# Patient Record
Sex: Female | Born: 1960
Health system: Southern US, Community
[De-identification: ages and names within clinical notes are randomized; demographics above are authoritative.]

## PROBLEM LIST (undated history)

## (undated) DIAGNOSIS — Z86010 Personal history of colonic polyps: Secondary | ICD-10-CM

## (undated) DIAGNOSIS — J4520 Mild intermittent asthma, uncomplicated: Secondary | ICD-10-CM

## (undated) DIAGNOSIS — Z9109 Other allergy status, other than to drugs and biological substances: Secondary | ICD-10-CM

## (undated) DIAGNOSIS — E785 Hyperlipidemia, unspecified: Secondary | ICD-10-CM

## (undated) DIAGNOSIS — R51 Headache: Secondary | ICD-10-CM

## (undated) DIAGNOSIS — IMO0001 Reserved for inherently not codable concepts without codable children: Secondary | ICD-10-CM

## (undated) DIAGNOSIS — R7303 Prediabetes: Secondary | ICD-10-CM

## (undated) DIAGNOSIS — D649 Anemia, unspecified: Secondary | ICD-10-CM

## (undated) DIAGNOSIS — L309 Dermatitis, unspecified: Secondary | ICD-10-CM

## (undated) DIAGNOSIS — R0602 Shortness of breath: Secondary | ICD-10-CM

## (undated) DIAGNOSIS — K219 Gastro-esophageal reflux disease without esophagitis: Secondary | ICD-10-CM

## (undated) DIAGNOSIS — Z860101 Personal history of adenomatous and serrated colon polyps: Secondary | ICD-10-CM

## (undated) DIAGNOSIS — J45909 Unspecified asthma, uncomplicated: Secondary | ICD-10-CM

## (undated) DIAGNOSIS — T7840XA Allergy, unspecified, initial encounter: Secondary | ICD-10-CM

## (undated) DIAGNOSIS — I10 Essential (primary) hypertension: Secondary | ICD-10-CM

## (undated) DIAGNOSIS — J9600 Acute respiratory failure, unspecified whether with hypoxia or hypercapnia: Secondary | ICD-10-CM

## (undated) DIAGNOSIS — F419 Anxiety disorder, unspecified: Secondary | ICD-10-CM

## (undated) HISTORY — DX: Prediabetes: R73.03

## (undated) HISTORY — PX: COLONOSCOPY: SHX174

## (undated) HISTORY — DX: Hyperlipidemia, unspecified: E78.5

## (undated) HISTORY — DX: Personal history of adenomatous and serrated colon polyps: Z86.0101

## (undated) HISTORY — DX: Reserved for inherently not codable concepts without codable children: IMO0001

## (undated) HISTORY — DX: Dermatitis, unspecified: L30.9

## (undated) HISTORY — DX: Personal history of colonic polyps: Z86.010

## (undated) HISTORY — DX: Anemia, unspecified: D64.9

## (undated) HISTORY — DX: Allergy, unspecified, initial encounter: T78.40XA

## (undated) HISTORY — PX: NASAL SINUS SURGERY: SHX719

## (undated) HISTORY — PX: POLYPECTOMY: SHX149

---
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
## 1991-08-06 HISTORY — PX: ABDOMINAL HYSTERECTOMY: SHX81

## 1998-01-01 ENCOUNTER — Emergency Department (HOSPITAL_COMMUNITY): Admission: EM | Admit: 1998-01-01 | Discharge: 1998-01-01 | Payer: Self-pay | Admitting: Emergency Medicine

## 1999-02-16 ENCOUNTER — Encounter: Admission: RE | Admit: 1999-02-16 | Discharge: 1999-03-15 | Payer: Self-pay | Admitting: *Deleted

## 2000-10-06 ENCOUNTER — Encounter: Payer: Self-pay | Admitting: Emergency Medicine

## 2000-10-06 ENCOUNTER — Emergency Department (HOSPITAL_COMMUNITY): Admission: EM | Admit: 2000-10-06 | Discharge: 2000-10-06 | Payer: Self-pay | Admitting: Emergency Medicine

## 2000-12-04 ENCOUNTER — Emergency Department (HOSPITAL_COMMUNITY): Admission: EM | Admit: 2000-12-04 | Discharge: 2000-12-04 | Payer: Self-pay | Admitting: Emergency Medicine

## 2003-07-09 ENCOUNTER — Emergency Department (HOSPITAL_COMMUNITY): Admission: EM | Admit: 2003-07-09 | Discharge: 2003-07-09 | Payer: Self-pay | Admitting: Emergency Medicine

## 2003-11-18 ENCOUNTER — Encounter: Admission: RE | Admit: 2003-11-18 | Discharge: 2003-11-18 | Payer: Self-pay | Admitting: Internal Medicine

## 2004-01-18 ENCOUNTER — Encounter: Admission: RE | Admit: 2004-01-18 | Discharge: 2004-01-18 | Payer: Self-pay | Admitting: Internal Medicine

## 2006-09-03 ENCOUNTER — Emergency Department (HOSPITAL_COMMUNITY): Admission: EM | Admit: 2006-09-03 | Discharge: 2006-09-03 | Payer: Self-pay | Admitting: Emergency Medicine

## 2008-11-02 ENCOUNTER — Emergency Department (HOSPITAL_COMMUNITY): Admission: EM | Admit: 2008-11-02 | Discharge: 2008-11-02 | Payer: Self-pay | Admitting: Emergency Medicine

## 2010-09-17 ENCOUNTER — Inpatient Hospital Stay (INDEPENDENT_AMBULATORY_CARE_PROVIDER_SITE_OTHER)
Admission: RE | Admit: 2010-09-17 | Discharge: 2010-09-17 | Disposition: A | Discharge status: Home / Self Care | Payer: 59 | Source: Ambulatory Visit | Attending: Family Medicine | Admitting: Family Medicine

## 2010-09-17 DIAGNOSIS — J45909 Unspecified asthma, uncomplicated: Secondary | ICD-10-CM

## 2010-09-17 DIAGNOSIS — J309 Allergic rhinitis, unspecified: Secondary | ICD-10-CM

## 2010-12-21 ENCOUNTER — Emergency Department (HOSPITAL_COMMUNITY): Payer: 59

## 2010-12-21 ENCOUNTER — Emergency Department (HOSPITAL_COMMUNITY)
Admission: EM | Admit: 2010-12-21 | Discharge: 2010-12-21 | Disposition: A | Discharge status: Home / Self Care | Payer: 59 | Attending: Emergency Medicine | Admitting: Emergency Medicine

## 2010-12-21 DIAGNOSIS — J45909 Unspecified asthma, uncomplicated: Secondary | ICD-10-CM | POA: Insufficient documentation

## 2010-12-21 DIAGNOSIS — R0609 Other forms of dyspnea: Secondary | ICD-10-CM | POA: Insufficient documentation

## 2010-12-21 DIAGNOSIS — R05 Cough: Secondary | ICD-10-CM | POA: Insufficient documentation

## 2010-12-21 DIAGNOSIS — R0989 Other specified symptoms and signs involving the circulatory and respiratory systems: Secondary | ICD-10-CM | POA: Insufficient documentation

## 2010-12-21 DIAGNOSIS — J40 Bronchitis, not specified as acute or chronic: Secondary | ICD-10-CM | POA: Insufficient documentation

## 2010-12-21 DIAGNOSIS — J3489 Other specified disorders of nose and nasal sinuses: Secondary | ICD-10-CM | POA: Insufficient documentation

## 2010-12-21 DIAGNOSIS — R059 Cough, unspecified: Secondary | ICD-10-CM | POA: Insufficient documentation

## 2012-03-01 ENCOUNTER — Emergency Department (HOSPITAL_COMMUNITY)
Admission: EM | Admit: 2012-03-01 | Discharge: 2012-03-01 | Disposition: A | Discharge status: Home / Self Care | Payer: 59 | Attending: Emergency Medicine | Admitting: Emergency Medicine

## 2012-03-01 ENCOUNTER — Encounter (HOSPITAL_COMMUNITY): Payer: Self-pay | Admitting: Emergency Medicine

## 2012-03-01 DIAGNOSIS — J45909 Unspecified asthma, uncomplicated: Secondary | ICD-10-CM | POA: Insufficient documentation

## 2012-03-01 DIAGNOSIS — I10 Essential (primary) hypertension: Secondary | ICD-10-CM | POA: Insufficient documentation

## 2012-03-01 DIAGNOSIS — T783XXA Angioneurotic edema, initial encounter: Secondary | ICD-10-CM | POA: Insufficient documentation

## 2012-03-01 DIAGNOSIS — T464X5A Adverse effect of angiotensin-converting-enzyme inhibitors, initial encounter: Secondary | ICD-10-CM

## 2012-03-01 DIAGNOSIS — T46905A Adverse effect of unspecified agents primarily affecting the cardiovascular system, initial encounter: Secondary | ICD-10-CM | POA: Insufficient documentation

## 2012-03-01 HISTORY — DX: Unspecified asthma, uncomplicated: J45.909

## 2012-03-01 HISTORY — DX: Essential (primary) hypertension: I10

## 2012-03-01 HISTORY — DX: Other allergy status, other than to drugs and biological substances: Z91.09

## 2012-03-01 MED ORDER — DIPHENHYDRAMINE HCL 50 MG/ML IJ SOLN
25.0000 mg | Freq: Once | INTRAMUSCULAR | Status: AC
  Administered 2012-03-01: 25 mg via INTRAVENOUS
  Filled 2012-03-01: qty 1

## 2012-03-01 MED ORDER — FAMOTIDINE 20 MG PO TABS: 20.0000 mg | ORAL_TABLET | Freq: Two times a day (BID) | ORAL | Status: DC

## 2012-03-01 MED ORDER — DIPHENHYDRAMINE HCL 25 MG PO TABS: 25.0000 mg | ORAL_TABLET | Freq: Four times a day (QID) | ORAL | Status: DC

## 2012-03-01 MED ORDER — METHYLPREDNISOLONE SODIUM SUCC 125 MG IJ SOLR
125.0000 mg | Freq: Once | INTRAMUSCULAR | Status: AC
  Administered 2012-03-01: 125 mg via INTRAVENOUS
  Filled 2012-03-01: qty 2

## 2012-03-01 MED ORDER — FAMOTIDINE IN NACL 20-0.9 MG/50ML-% IV SOLN
20.0000 mg | Freq: Once | INTRAVENOUS | Status: AC
  Administered 2012-03-01: 20 mg via INTRAVENOUS
  Filled 2012-03-01: qty 50

## 2012-03-01 MED ORDER — PREDNISONE 20 MG PO TABS: 60.0000 mg | ORAL_TABLET | Freq: Every day | ORAL | Status: AC

## 2012-03-01 NOTE — ED Notes (Signed)
Patient with angioedema to lips.  Patient started Lisinopril yesterday am.  No shortness of breath.

## 2012-03-01 NOTE — ED Provider Notes (Signed)
History     CSN: [id number]  Arrival date & time 03/01/12  0109   First MD Initiated Contact with Patient 03/01/12 0133      Chief Complaint  Patient presents with  . Medication Reaction    (Consider location/radiation/quality/duration/timing/severity/associated sxs/prior treatment) HPI 51 yo female presents to the ER with complaint of lip swelling.  Pt was started yesterday on lisinopril, reports taking first pill around 5 pm and noticiing swelling to the left side of her mouth around 9 pm.  Pt woke to find worsening swelling.  She denies any swelling of tongue, diffculties in swallowing, breathing.  No prior h/o angioedema.   Past Medical History  Diagnosis Date  . Hypertension   . Environmental allergies   . Asthma     History reviewed. No pertinent past surgical history.  History reviewed. No pertinent family history.  History  Substance Use Topics  . Smoking status: Never Smoker   . Smokeless tobacco: Not on file  . Alcohol Use: No    OB History    Grav Para Term Preterm Abortions TAB SAB Ect Mult Living                  Review of Systems  All other systems reviewed and are negative.    Allergies  Ace inhibitors and Ibuprofen  Home Medications   Current Outpatient Rx  Name Route Sig Dispense Refill  . ALBUTEROL SULFATE HFA 108 (90 BASE) MCG/ACT IN AERS Inhalation Inhale 2 puffs into the lungs every 6 (six) hours as needed. For shortness of breath    . BUDESONIDE-FORMOTEROL FUMARATE 160-4.5 MCG/ACT IN AERO Inhalation Inhale 2 puffs into the lungs 2 (two) times daily.    Marland Kitchen METOPROLOL TARTRATE 50 MG PO TABS Oral Take 50 mg by mouth 2 (two) times daily.    . MOMETASONE FUROATE 50 MCG/ACT NA SUSP Nasal Place 2 sprays into the nose daily.    Marland Kitchen DIPHENHYDRAMINE HCL 25 MG PO TABS Oral Take 1 tablet (25 mg total) by mouth every 6 (six) hours. 20 tablet 0  . FAMOTIDINE 20 MG PO TABS Oral Take 1 tablet (20 mg total) by mouth 2 (two) times daily. 30 tablet 0  .  PREDNISONE 20 MG PO TABS Oral Take 3 tablets (60 mg total) by mouth daily. 15 tablet 0    BP 119/68  Pulse 75  Temp 98.5 F (36.9 C) (Oral)  Resp 20  SpO2 97%  Physical Exam  Nursing note and vitals reviewed. Constitutional: She is oriented to person, place, and time. She appears well-developed and well-nourished. No distress.  HENT:  Head: Normocephalic and atraumatic.  Nose: Nose normal.  Mouth/Throat: Oropharynx is clear and moist.       Angioedema of lips.  No swelling of tongue, posterior pharynx or uvula.  No stridor or dysphagia, dysphonia  Eyes: Conjunctivae and EOM are normal. Pupils are equal, round, and reactive to light.  Neck: Normal range of motion. Neck supple. No JVD present. No tracheal deviation present. No thyromegaly present.  Cardiovascular: Normal rate, regular rhythm, normal heart sounds and intact distal pulses.  Exam reveals no gallop and no friction rub.   No murmur heard. Pulmonary/Chest: Effort normal and breath sounds normal. No stridor. No respiratory distress. She has no wheezes. She has no rales. She exhibits no tenderness.  Abdominal: Soft. Bowel sounds are normal. She exhibits no distension and no mass. There is no tenderness. There is no rebound and no guarding.  Musculoskeletal: Normal range  of motion. She exhibits no edema and no tenderness.  Lymphadenopathy:    She has no cervical adenopathy.  Neurological: She is alert and oriented to person, place, and time. She has normal reflexes. She exhibits normal muscle tone. Coordination normal.  Skin: Skin is dry. No rash noted. She is not diaphoretic. No erythema. No pallor.  Psychiatric: She has a normal mood and affect. Her behavior is normal. Judgment and thought content normal.    ED Course  Procedures (including critical care time)  Labs Reviewed - No data to display No results found.   1. ACE inhibitor-aggravated angioedema       MDM  Angioedema most likely due to recent ACE inhibitor  prescription.  No progression of symptoms, and lips are less swollen then on arrival.  To f/u with pcm for alternative bp medications        Olivia Mackie, MD 03/01/12 1723

## 2012-03-01 NOTE — ED Notes (Signed)
Pt to RN first with swelling to lips; pt denied SOB or difficulty breathing; while checking in, continued to speak with pt re: what she had reaction to, pt's friend reports she was just started on a new medicine--pt showed medicine bottle for lisinopril--notified triage RN that pt was checking in with angioedema- walked pt to triage room 3

## 2012-06-05 ENCOUNTER — Encounter (HOSPITAL_COMMUNITY): Payer: Self-pay | Admitting: Emergency Medicine

## 2012-06-05 ENCOUNTER — Emergency Department (INDEPENDENT_AMBULATORY_CARE_PROVIDER_SITE_OTHER)
Admission: EM | Admit: 2012-06-05 | Discharge: 2012-06-05 | Disposition: A | Discharge status: Home / Self Care | Payer: Self-pay | Source: Home / Self Care | Attending: Family Medicine | Admitting: Family Medicine

## 2012-06-05 DIAGNOSIS — I10 Essential (primary) hypertension: Secondary | ICD-10-CM

## 2012-06-05 DIAGNOSIS — J45901 Unspecified asthma with (acute) exacerbation: Secondary | ICD-10-CM

## 2012-06-05 DIAGNOSIS — J329 Chronic sinusitis, unspecified: Secondary | ICD-10-CM

## 2012-06-05 DIAGNOSIS — E876 Hypokalemia: Secondary | ICD-10-CM

## 2012-06-05 LAB — POCT I-STAT, CHEM 8
Calcium, Ion: 1.25 mmol/L — ABNORMAL HIGH (ref 1.12–1.23)
Glucose, Bld: 85 mg/dL (ref 70–99)
HCT: 41 % (ref 36.0–46.0)
Hemoglobin: 13.9 g/dL (ref 12.0–15.0)
TCO2: 30 mmol/L (ref 0–100)

## 2012-06-05 MED ORDER — TRIAMTERENE-HCTZ 37.5-25 MG PO TABS: 1.0000 | ORAL_TABLET | Freq: Every day | ORAL | Status: DC

## 2012-06-05 MED ORDER — CLONIDINE HCL 0.1 MG PO TABS
ORAL_TABLET | ORAL | Status: AC
  Filled 2012-06-05: qty 1

## 2012-06-05 MED ORDER — BUDESONIDE-FORMOTEROL FUMARATE 160-4.5 MCG/ACT IN AERO: 2.0000 | INHALATION_SPRAY | Freq: Two times a day (BID) | RESPIRATORY_TRACT | Status: DC

## 2012-06-05 MED ORDER — MOMETASONE FUROATE 50 MCG/ACT NA SUSP: 2.0000 | Freq: Every day | NASAL | Status: DC

## 2012-06-05 MED ORDER — METHYLPREDNISOLONE SODIUM SUCC 125 MG IJ SOLR
125.0000 mg | Freq: Once | INTRAMUSCULAR | Status: AC
  Administered 2012-06-05: 125 mg via INTRAMUSCULAR

## 2012-06-05 MED ORDER — METHYLPREDNISOLONE SODIUM SUCC 125 MG IJ SOLR: 125.0000 mg | Freq: Once | INTRAMUSCULAR | Status: DC

## 2012-06-05 MED ORDER — PREDNISONE 20 MG PO TABS: ORAL_TABLET | ORAL | Status: DC

## 2012-06-05 MED ORDER — ALBUTEROL SULFATE HFA 108 (90 BASE) MCG/ACT IN AERS: 2.0000 | INHALATION_SPRAY | Freq: Four times a day (QID) | RESPIRATORY_TRACT | Status: DC | PRN

## 2012-06-05 MED ORDER — ALBUTEROL SULFATE (5 MG/ML) 0.5% IN NEBU
INHALATION_SOLUTION | RESPIRATORY_TRACT | Status: AC
  Filled 2012-06-05: qty 0.5

## 2012-06-05 MED ORDER — ALBUTEROL SULFATE (5 MG/ML) 0.5% IN NEBU
2.5000 mg | INHALATION_SOLUTION | Freq: Once | RESPIRATORY_TRACT | Status: AC
  Administered 2012-06-05: 2.5 mg via RESPIRATORY_TRACT

## 2012-06-05 MED ORDER — CLONIDINE HCL ER 0.1 MG PO TB12
0.1000 mg | ORAL_TABLET | Freq: Once | ORAL | Status: AC
  Administered 2012-06-05: 0.1 mg via ORAL

## 2012-06-05 MED ORDER — METHYLPREDNISOLONE SODIUM SUCC 125 MG IJ SOLR
INTRAMUSCULAR | Status: AC
  Filled 2012-06-05: qty 2

## 2012-06-05 MED ORDER — AMOXICILLIN 500 MG PO CAPS: 500.0000 mg | ORAL_CAPSULE | Freq: Three times a day (TID) | ORAL | Status: DC

## 2012-06-05 MED ORDER — POTASSIUM CHLORIDE ER 10 MEQ PO TBCR: 10.0000 meq | EXTENDED_RELEASE_TABLET | Freq: Two times a day (BID) | ORAL | Status: DC

## 2012-06-05 MED ORDER — IPRATROPIUM BROMIDE 0.02 % IN SOLN
0.5000 mg | Freq: Once | RESPIRATORY_TRACT | Status: AC
  Administered 2012-06-05: 0.5 mg via RESPIRATORY_TRACT

## 2012-06-05 NOTE — ED Notes (Signed)
Pt c/o cold sx x2 weeks... Sx include: coughing w/yellow sputum, headaches, diarrhea, nauseas, SOB, wheezing, nasal congestion... Denies: fevers, vomiting.... Pt is alert w/no signs of distress.

## 2012-06-05 NOTE — ED Provider Notes (Signed)
History     CSN: [id number]  Arrival date & time 06/05/12  1205   First MD Initiated Contact with Patient 06/05/12 1306      Chief Complaint  Patient presents with  . URI    (Consider location/radiation/quality/duration/timing/severity/associated sxs/prior treatment) HPI Comments: 51 year old female with history of chronic rhinosinusitis, asthma and hypertension. Here complaining of cold like symptoms for 2 weeks. Patient reports nasal congestion to the point that she cannot breathe through her nose. Sinus pressure and thick yellow nasal discharge. She also had cough and intermittent wheezing. She has had yellow sputum with no blood. Has also experienced headache and nausea. Denies fever or chills. Denies chest pain. Patient reports she used to work at the [employer] but was [employer] several months ago. She does not have medical insurance currently and does not have a primary care Dr. She has not taken any of her medications since July 2013. Denies leg swelling or PND. Patient reports she is allergic to ACE inhibitors and also metoprolol.    Past Medical History  Diagnosis Date  . Hypertension   . Environmental allergies   . Asthma     History reviewed. No pertinent past surgical history.  No family history on file.  History  Substance Use Topics  . Smoking status: Never Smoker   . Smokeless tobacco: Not on file  . Alcohol Use: No    OB History    Grav Para Term Preterm Abortions TAB SAB Ect Mult Living                  Review of Systems  Constitutional: Negative for fever, chills, diaphoresis, appetite change and fatigue.  HENT: Positive for congestion, rhinorrhea, sneezing and sinus pressure. Negative for sore throat, facial swelling, trouble swallowing, neck pain and neck stiffness.   Eyes: Positive for itching.       Bilateral eye tearing.   Respiratory: Positive for cough, shortness of breath and wheezing. Negative for chest tightness.   Cardiovascular:  Negative for chest pain, palpitations and leg swelling.  Gastrointestinal: Positive for nausea. Negative for vomiting, abdominal pain and diarrhea.  Musculoskeletal: Negative for joint swelling and arthralgias.  Skin: Negative for rash.  Neurological: Positive for headaches. Negative for seizures, syncope, weakness and numbness.    Allergies  Ace inhibitors; Ibuprofen; and Lipitor  Home Medications   Current Outpatient Rx  Name Route Sig Dispense Refill  . ALBUTEROL SULFATE HFA 108 (90 BASE) MCG/ACT IN AERS Inhalation Inhale 2 puffs into the lungs every 6 (six) hours as needed for wheezing or shortness of breath. For shortness of breath 1 Inhaler 1  . AMOXICILLIN 500 MG PO CAPS Oral Take 1 capsule (500 mg total) by mouth 3 (three) times daily. 21 capsule 0  . BUDESONIDE-FORMOTEROL FUMARATE 160-4.5 MCG/ACT IN AERO Inhalation Inhale 2 puffs into the lungs 2 (two) times daily. 1 Inhaler 1  . FAMOTIDINE 20 MG PO TABS Oral Take 1 tablet (20 mg total) by mouth 2 (two) times daily. 30 tablet 0  . MOMETASONE FUROATE 50 MCG/ACT NA SUSP Nasal Place 2 sprays into the nose daily. 17 g With the1  . POTASSIUM CHLORIDE ER 10 MEQ PO TBCR Oral Take 1 tablet (10 mEq total) by mouth 2 (two) times daily. 6 tablet 0  . PREDNISONE 20 MG PO TABS  2 tabs by mouth daily for 5 days. 10 tablet 0  . TRIAMTERENE-HCTZ 37.5-25 MG PO TABS Oral Take 1 each (1 tablet total) by mouth daily. 30 tablet  1    BP 198/100  Pulse 83  Temp 97.9 F (36.6 C) (Oral)  Resp 22  SpO2 100%  Physical Exam  Nursing note and vitals reviewed. Constitutional: She is oriented to person, place, and time. She appears well-developed and well-nourished. No distress.  HENT:  Head: Normocephalic and atraumatic.       Nasal Congestion with erythema and gray color and severe swelling of nasal turbinates with complete occlusion of bilateral nasal passages, yellow rhinorrhea. Postnasal drip, pharyngeal erythema no exudates. No uvula deviation.  No trismus. TM's normal.   Eyes: EOM are normal. Pupils are equal, round, and reactive to light. No scleral icterus.       Clear tearing of both eyes.  Neck: Neck supple. No JVD present. No thyromegaly present.  Cardiovascular: Normal rate, regular rhythm, normal heart sounds and intact distal pulses.  Exam reveals no gallop and no friction rub.   No murmur heard. Pulmonary/Chest: Effort normal. No respiratory distress. She has no rales. She exhibits no tenderness.       Sporadic bilateral expiratory wheezing. No orthopnea, tachypnea or retractions.   Abdominal: Soft. There is no tenderness.       No hepatosplenomegaly. No ascitis.  Lymphadenopathy:    She has no cervical adenopathy.  Neurological: She is alert and oriented to person, place, and time.  Skin: No rash noted. She is not diaphoretic.    ED Course  Procedures (including critical care time)  Labs Reviewed  POCT I-STAT, CHEM 8 - Abnormal; Notable for the following:    Potassium 2.9 (*)     BUN 3 (*)     Calcium, Ion 1.25 (*)     All other components within normal limits   No results found.  EKG: Normal sinus rhythm: Tall R/S waves in most leads suggestive of right atrial and left ventricular hypertrophy. No ST or other acute ischemic changes.  1. Sinusitis   2. Asthma exacerbation   3. Hypertension   4. Hypokalemia       MDM  Patient had administered solumedrol 125 mg IM x1. clonidine 0.1mg  oral x1 and albuterol/ipratropium neb x1.  Her lungs were clear to ascultation at time of discharge. Refilled albuterol, nasonex, symbicort. Is possible patient will no be able to fill nasonex and symbicort due to cost as she does not have medical insurance. Prescribed prednisone, Maxzide and amoxicillin and [place] patient states she will be able to fill these prescriptions. Asked to return in 48-72 hours for follow up or return earlier like difficulty breathing, chest pain or new onset of fever if worsening symptoms despite  following treatment.          Sharin Grave, MD 06/06/12 [phone]

## 2012-12-21 ENCOUNTER — Emergency Department (HOSPITAL_COMMUNITY)
Admission: EM | Admit: 2012-12-21 | Discharge: 2012-12-21 | Disposition: A | Discharge status: Home / Self Care | Payer: Medicaid Other | Attending: Emergency Medicine | Admitting: Emergency Medicine

## 2012-12-21 ENCOUNTER — Encounter (HOSPITAL_COMMUNITY): Payer: Self-pay | Admitting: Nurse Practitioner

## 2012-12-21 ENCOUNTER — Emergency Department (HOSPITAL_COMMUNITY): Payer: Medicaid Other

## 2012-12-21 DIAGNOSIS — I10 Essential (primary) hypertension: Secondary | ICD-10-CM | POA: Insufficient documentation

## 2012-12-21 DIAGNOSIS — J3489 Other specified disorders of nose and nasal sinuses: Secondary | ICD-10-CM | POA: Insufficient documentation

## 2012-12-21 DIAGNOSIS — R040 Epistaxis: Secondary | ICD-10-CM | POA: Insufficient documentation

## 2012-12-21 DIAGNOSIS — Z79899 Other long term (current) drug therapy: Secondary | ICD-10-CM | POA: Insufficient documentation

## 2012-12-21 DIAGNOSIS — J45901 Unspecified asthma with (acute) exacerbation: Secondary | ICD-10-CM

## 2012-12-21 DIAGNOSIS — J309 Allergic rhinitis, unspecified: Secondary | ICD-10-CM | POA: Insufficient documentation

## 2012-12-21 DIAGNOSIS — J302 Other seasonal allergic rhinitis: Secondary | ICD-10-CM

## 2012-12-21 MED ORDER — PREDNISONE 20 MG PO TABS
60.0000 mg | ORAL_TABLET | Freq: Once | ORAL | Status: AC
  Administered 2012-12-21: 60 mg via ORAL
  Filled 2012-12-21: qty 3

## 2012-12-21 MED ORDER — OXYMETAZOLINE HCL 0.05 % NA SOLN: 2.0000 | Freq: Two times a day (BID) | NASAL | Status: DC

## 2012-12-21 MED ORDER — ALBUTEROL SULFATE HFA 108 (90 BASE) MCG/ACT IN AERS
2.0000 | INHALATION_SPRAY | Freq: Once | RESPIRATORY_TRACT | Status: AC
  Administered 2012-12-21: 2 via RESPIRATORY_TRACT
  Filled 2012-12-21: qty 6.7

## 2012-12-21 MED ORDER — PREDNISONE 20 MG PO TABS: 40.0000 mg | ORAL_TABLET | Freq: Every day | ORAL | Status: DC

## 2012-12-21 MED ORDER — BENZONATATE 100 MG PO CAPS: 100.0000 mg | ORAL_CAPSULE | Freq: Three times a day (TID) | ORAL | Status: DC

## 2012-12-21 MED ORDER — AEROCHAMBER PLUS W/MASK MISC
Status: AC
  Administered 2012-12-21: 18:00:00
  Filled 2012-12-21: qty 1

## 2012-12-21 NOTE — ED Notes (Signed)
Per ems:pt called for asthma r/t allergies for past days. No pcp to treat her asthma. Wheezing noted on arrival, En route ems administered 5mg  albuterol IH with improvement. BP 150/100, tp with Hx HTN. 20gLFA started en route. A&Ox4, breathing easily now

## 2012-12-21 NOTE — ED Notes (Signed)
Pt in radiology 

## 2012-12-21 NOTE — ED Notes (Signed)
Pt c/o asthma attack earlier today that caused her to have high BP along with a nose bleed earlier today. Pt sts she has had some postnasal drainage for a few days and it is causing her difficulty to sleep. Pt c/o cough X over a week. Pt reports she takes OTC meds but hasn't been working well. Pt sts she used her inhaler today.

## 2012-12-21 NOTE — ED Provider Notes (Signed)
History  This chart was scribed for Tammie Racer, MD by Tammie [name], ED Scribe. The patient was seen in room TR05C/TR05C. Patient's care was started at 1722.   CSN: [id number]  Arrival date & time 12/21/12  1607   First MD Initiated Contact with Patient 12/21/12 1722      Chief Complaint  Patient presents with  . Asthma    Patient is a 52 y.o. female presenting with asthma. The history is provided by the patient. No language interpreter was used.  Asthma This is a chronic problem. The current episode started more than 2 days ago. The problem occurs daily. The problem has not changed since onset.Associated symptoms include shortness of breath. Pertinent negatives include no headaches. Relieved by: breathing treatments. Treatments tried: albuterol inhaler.    HPI Comments: Tammie [name] is a 52 y.o. female with history of asthma, environmental allergies and hypertension who presents to the Emergency Department complaining of moderate, intermittent wheezing for the past few days. There is associated shortness of breath, nasal congestion and rhinorrhea. Patient arrived via EMS and was give 6 mg of albuterol with improvement. She states that breathing treatments given in the ED have also provided relief. Patient has no PMD to manage chronic conditions so she has not been able to administer medicines at home. Patient does not smoke.   Past Medical History  Diagnosis Date  . Hypertension   . Environmental allergies   . Asthma     History reviewed. No pertinent past surgical history.  History reviewed. No pertinent family history.  History  Substance Use Topics  . Smoking status: Never Smoker   . Smokeless tobacco: Not on file  . Alcohol Use: No    OB History   Grav Para Term Preterm Abortions TAB SAB Ect Mult Living                  Review of Systems  Constitutional: Negative for fever and chills.  HENT: Positive for nosebleeds, congestion and rhinorrhea.   Respiratory:  Positive for shortness of breath and wheezing.   Neurological: Negative for headaches.    Allergies  Ace inhibitors; Ibuprofen; and Lipitor  Home Medications   Current Outpatient Rx  Name  Route  Sig  Dispense  Refill  . albuterol (PROVENTIL HFA;VENTOLIN HFA) 108 (90 BASE) MCG/ACT inhaler   Inhalation   Inhale 2 puffs into the lungs every 6 (six) hours as needed for wheezing or shortness of breath. For shortness of breath   1 Inhaler   1   . budesonide-formoterol (SYMBICORT) 160-4.5 MCG/ACT inhaler   Inhalation   Inhale 2 puffs into the lungs 2 (two) times daily.   1 Inhaler   1   . fexofenadine (ALLEGRA) 180 MG tablet   Oral   Take 180 mg by mouth daily.         Marland Kitchen loratadine (CLARITIN) 10 MG tablet   Oral   Take 10 mg by mouth daily.           Triage Vitals: BP 131/90  Pulse 111  Temp(Src) 97.1 F (36.2 C)  Resp 22  SpO2 95%  Physical Exam  Constitutional: She is oriented to person, place, and time. She appears well-developed and well-nourished.  HENT:  Head: Normocephalic and atraumatic.  Mouth/Throat: Oropharynx is clear and moist.  Swollen bilateral nasal mucosa. Oropharynx is clear.   Eyes: Conjunctivae are normal.  Neck: Normal range of motion. Neck supple.  Cardiovascular: Normal rate and regular rhythm.   Pulmonary/Chest:  Effort normal. No respiratory distress. She has wheezes.  Faint scattered wheezing.  Musculoskeletal: Normal range of motion.  Lymphadenopathy:    She has no cervical adenopathy.  Neurological: She is alert and oriented to person, place, and time.  Skin: Skin is warm and dry.    ED Course  Procedures (including critical care time) DIAGNOSTIC STUDIES: Oxygen Saturation is 95% on room air, adequate by my interpretation.    COORDINATION OF CARE: 5:56 PM- Patient presents with wheezing, nasal congestion and shortness of breath. She has no PMD to help manage conditions. Will prescribe prednisone 20 mg, Tessalon 100 mg and Afrin  as well as provide resources for patient to find PCP. Patient informed of current plan for treatment and evaluation and agrees with plan at this time.    Dg Chest 2 View (if Patient Has Fever And/or Copd)  12/21/2012   *RADIOLOGY REPORT*  Clinical Data: Congestion, asthma, hypertension  CHEST - 2 VIEW  Comparison: Chest x-ray of 12/21/2010  Findings: No active infiltrate or effusion is seen.  Mild cardiomegaly is stable.  No bony abnormality is noted.  IMPRESSION: Stable mild cardiomegaly.  No active lung disease.   Original Report Authenticated By: Dwyane Dee, M.D.     1. Seasonal allergies   2. Asthma exacerbation       MDM  I personally performed the services described in this documentation, which was scribed in my presence. The recorded information has been reviewed and is accurate.    Tammie Racer, MD 12/21/12 1910

## 2013-07-14 ENCOUNTER — Encounter (HOSPITAL_COMMUNITY): Payer: Self-pay | Admitting: Emergency Medicine

## 2013-07-14 ENCOUNTER — Emergency Department (INDEPENDENT_AMBULATORY_CARE_PROVIDER_SITE_OTHER): Payer: Self-pay

## 2013-07-14 ENCOUNTER — Emergency Department (INDEPENDENT_AMBULATORY_CARE_PROVIDER_SITE_OTHER)
Admission: EM | Admit: 2013-07-14 | Discharge: 2013-07-14 | Disposition: A | Discharge status: Home / Self Care | Payer: Self-pay | Source: Home / Self Care | Attending: Family Medicine | Admitting: Family Medicine

## 2013-07-14 DIAGNOSIS — J45901 Unspecified asthma with (acute) exacerbation: Secondary | ICD-10-CM

## 2013-07-14 MED ORDER — ALBUTEROL SULFATE (5 MG/ML) 0.5% IN NEBU
5.0000 mg | INHALATION_SOLUTION | Freq: Once | RESPIRATORY_TRACT | Status: AC
  Administered 2013-07-14: 5 mg via RESPIRATORY_TRACT

## 2013-07-14 MED ORDER — PREDNISONE 50 MG PO TABS: 50.0000 mg | ORAL_TABLET | Freq: Every day | ORAL | Status: DC

## 2013-07-14 MED ORDER — SODIUM CHLORIDE 0.9 % IN NEBU
INHALATION_SOLUTION | RESPIRATORY_TRACT | Status: AC
  Filled 2013-07-14: qty 3

## 2013-07-14 MED ORDER — ALBUTEROL SULFATE (5 MG/ML) 0.5% IN NEBU
INHALATION_SOLUTION | RESPIRATORY_TRACT | Status: AC
  Filled 2013-07-14: qty 1

## 2013-07-14 MED ORDER — ALBUTEROL SULFATE HFA 108 (90 BASE) MCG/ACT IN AERS
INHALATION_SPRAY | RESPIRATORY_TRACT | Status: AC
  Filled 2013-07-14: qty 6.7

## 2013-07-14 MED ORDER — SODIUM CHLORIDE 0.9 % IN NEBU
INHALATION_SOLUTION | RESPIRATORY_TRACT | Status: AC
  Filled 2013-07-14: qty 6

## 2013-07-14 MED ORDER — METHYLPREDNISOLONE SODIUM SUCC 125 MG IJ SOLR
INTRAMUSCULAR | Status: AC
  Filled 2013-07-14: qty 2

## 2013-07-14 MED ORDER — IPRATROPIUM BROMIDE 0.02 % IN SOLN
0.5000 mg | Freq: Once | RESPIRATORY_TRACT | Status: AC
  Administered 2013-07-14: 0.5 mg via RESPIRATORY_TRACT

## 2013-07-14 MED ORDER — IPRATROPIUM BROMIDE 0.02 % IN SOLN
RESPIRATORY_TRACT | Status: AC
  Filled 2013-07-14: qty 2.5

## 2013-07-14 MED ORDER — METHYLPREDNISOLONE SODIUM SUCC 125 MG IJ SOLR
80.0000 mg | Freq: Once | INTRAMUSCULAR | Status: AC
  Administered 2013-07-14: 80 mg via INTRAMUSCULAR

## 2013-07-14 MED ORDER — ALBUTEROL SULFATE HFA 108 (90 BASE) MCG/ACT IN AERS
2.0000 | INHALATION_SPRAY | RESPIRATORY_TRACT | Status: DC | PRN
  Administered 2013-07-14: 2 via RESPIRATORY_TRACT

## 2013-07-14 NOTE — ED Notes (Signed)
1 mo nth duration of SOB, worse past week; reported hist of HBP controled w Lipitor (causes swelling)

## 2013-07-14 NOTE — ED Provider Notes (Signed)
Tammie [name] is a 52 y.o. female who presents to Urgent Care today for shortness of breath. Patient has had shortness of breath and some coughing present for the last one month worsening over the last week. She notes some wheezing. She states that she has a long history of asthma but is not on any medications. She denies any chest pains palpitations nausea vomiting or diarrhea. She feels well otherwise.   Past Medical History  Diagnosis Date  . Hypertension   . Environmental allergies   . Asthma    History  Substance Use Topics  . Smoking status: Never Smoker   . Smokeless tobacco: Not on file  . Alcohol Use: No   ROS as above Medications reviewed. Current Facility-Administered Medications  Medication Dose Route Frequency Provider Last Rate Last Dose  . albuterol (PROVENTIL HFA;VENTOLIN HFA) 108 (90 BASE) MCG/ACT inhaler 2 puff  2 puff Inhalation Q4H PRN Rodolph Bong, MD      . methylPREDNISolone sodium succinate (SOLU-MEDROL) 125 mg/2 mL injection 80 mg  80 mg Intramuscular Once Rodolph Bong, MD       Current Outpatient Prescriptions  Medication Sig Dispense Refill  . budesonide-formoterol (SYMBICORT) 160-4.5 MCG/ACT inhaler Inhale 2 puffs into the lungs 2 (two) times daily.  1 Inhaler  1  . fexofenadine (ALLEGRA) 180 MG tablet Take 180 mg by mouth daily.      Marland Kitchen loratadine (CLARITIN) 10 MG tablet Take 10 mg by mouth daily.      . predniSONE (DELTASONE) 50 MG tablet Take 1 tablet (50 mg total) by mouth daily.  5 tablet  0  . [DISCONTINUED] albuterol (PROVENTIL HFA;VENTOLIN HFA) 108 (90 BASE) MCG/ACT inhaler Inhale 2 puffs into the lungs every 6 (six) hours as needed for wheezing or shortness of breath. For shortness of breath  1 Inhaler  1  . [DISCONTINUED] diphenhydrAMINE (BENADRYL) 25 MG tablet Take 1 tablet (25 mg total) by mouth every 6 (six) hours.  20 tablet  0  . [DISCONTINUED] metoprolol (LOPRESSOR) 50 MG tablet Take 50 mg by mouth 2 (two) times daily.        Exam:  BP  195/113  Pulse 109  Temp(Src) 97.2 F (36.2 C) (Oral)  Resp 32  SpO2 96% Filed Vitals:   07/14/13 0817 07/14/13 0911  BP: 195/113   Pulse: 96 109  Temp: 97.2 F (36.2 C)   TempSrc: Oral   Resp: 32   SpO2: 91% 96%    Gen: Well NAD HEENT: EOMI,  MMM Lungs: Increased work of breathing. Difficult to get more than one sentence out. Prolonged expiratory phase. Decreased breath sounds bilaterally. Heart: RRR no MRG Abd: NABS, Soft. NT, ND Exts: Non edematous BL  LE, warm and well perfused.   Patient was given 0.5 mg and Atrovent 5 mg of albuterol and had subjective improvement. She continued to have significant expiratory wheezing bilaterally following the first breathing treatment.  Patient was given a second 5 mg albuterol nebulizer treatment and had considerable improvement in symptoms. Her lung exam showed reduced wheezing and improved expiratory phase bilaterally.  No results found for this or any previous visit (from the past 24 hour(s)). Dg Chest 2 View  07/14/2013   CLINICAL DATA:  Shortness of breath and cough  EXAM: CHEST  2 VIEW  COMPARISON:  12/21/2012, 12/21/2010  FINDINGS: The heart size is at upper limits of normal. Both lungs are clear. The visualized skeletal structures are unremarkable. Right upper lobe granulomas are stable since the 2012  prior exam.  IMPRESSION: No active cardiopulmonary disease.   Electronically Signed   By: Christiana Pellant M.D.   On: 07/14/2013 09:28    Assessment and Plan: 52 y.o. female with asthma exacerbation. Which improved after 2 nebulizer treatments. Unfortunately patient does not have health insurance currently. This is one of her essential social issues. Plan: Provide IM Solu-Medrol and a albuterol inhaler prior to discharge. Prescribe prednisone.  Refer patient to the [facility] for further evaluation and management.  Discussed careful percussion. Discussed warning signs or symptoms. Please see discharge  instructions. Patient expresses understanding.      Rodolph Bong, MD 07/14/13 [phone]

## 2013-09-19 ENCOUNTER — Emergency Department (HOSPITAL_COMMUNITY)
Admission: EM | Admit: 2013-09-19 | Discharge: 2013-09-19 | Disposition: A | Discharge status: Home / Self Care | Payer: No Typology Code available for payment source | Attending: Emergency Medicine | Admitting: Emergency Medicine

## 2013-09-19 ENCOUNTER — Encounter (HOSPITAL_COMMUNITY): Payer: Self-pay | Admitting: Emergency Medicine

## 2013-09-19 ENCOUNTER — Emergency Department (HOSPITAL_COMMUNITY): Payer: No Typology Code available for payment source

## 2013-09-19 DIAGNOSIS — E876 Hypokalemia: Secondary | ICD-10-CM | POA: Insufficient documentation

## 2013-09-19 DIAGNOSIS — I10 Essential (primary) hypertension: Secondary | ICD-10-CM | POA: Insufficient documentation

## 2013-09-19 DIAGNOSIS — J45901 Unspecified asthma with (acute) exacerbation: Secondary | ICD-10-CM | POA: Insufficient documentation

## 2013-09-19 DIAGNOSIS — Z79899 Other long term (current) drug therapy: Secondary | ICD-10-CM | POA: Insufficient documentation

## 2013-09-19 LAB — POCT I-STAT, CHEM 8
BUN: <3 mg/dL — ABNORMAL LOW (ref 6–23)
CALCIUM ION: 1.22 mmol/L (ref 1.12–1.23)
CREATININE: 0.9 mg/dL (ref 0.50–1.10)
Chloride: 100 mEq/L (ref 96–112)
GLUCOSE: 127 mg/dL — AB (ref 70–99)
HCT: 43 % (ref 36.0–46.0)
Hemoglobin: 14.6 g/dL (ref 12.0–15.0)
Potassium: 2.4 mEq/L — CL (ref 3.7–5.3)
Sodium: 145 mEq/L (ref 137–147)
TCO2: 30 mmol/L (ref 0–100)

## 2013-09-19 LAB — CBC WITH DIFFERENTIAL/PLATELET
BASOS ABS: 0 10*3/uL (ref 0.0–0.1)
Basophils Relative: 1 % (ref 0–1)
Eosinophils Absolute: 1 10*3/uL — ABNORMAL HIGH (ref 0.0–0.7)
Eosinophils Relative: 13 % — ABNORMAL HIGH (ref 0–5)
HCT: 39 % (ref 36.0–46.0)
Hemoglobin: 13.4 g/dL (ref 12.0–15.0)
Lymphocytes Relative: 23 % (ref 12–46)
Lymphs Abs: 1.8 10*3/uL (ref 0.7–4.0)
MCH: 28.3 pg (ref 26.0–34.0)
MCHC: 34.4 g/dL (ref 30.0–36.0)
MCV: 82.3 fL (ref 78.0–100.0)
MONO ABS: 0.3 10*3/uL (ref 0.1–1.0)
Monocytes Relative: 4 % (ref 3–12)
Neutro Abs: 4.7 10*3/uL (ref 1.7–7.7)
Neutrophils Relative %: 61 % (ref 43–77)
Platelets: 256 10*3/uL (ref 150–400)
RBC: 4.74 MIL/uL (ref 3.87–5.11)
RDW: 13.1 % (ref 11.5–15.5)
WBC: 7.7 10*3/uL (ref 4.0–10.5)

## 2013-09-19 MED ORDER — POTASSIUM CHLORIDE 10 MEQ/100ML IV SOLN: 10.0000 meq | Freq: Once | INTRAVENOUS | Status: DC

## 2013-09-19 MED ORDER — ALBUTEROL SULFATE HFA 108 (90 BASE) MCG/ACT IN AERS: 2.0000 | INHALATION_SPRAY | RESPIRATORY_TRACT | Status: DC | PRN

## 2013-09-19 MED ORDER — PREDNISONE 50 MG PO TABS: 50.0000 mg | ORAL_TABLET | Freq: Every day | ORAL | Status: DC

## 2013-09-19 MED ORDER — ALBUTEROL SULFATE (2.5 MG/3ML) 0.083% IN NEBU: 2.5000 mg | INHALATION_SOLUTION | Freq: Once | RESPIRATORY_TRACT | Status: DC

## 2013-09-19 MED ORDER — POTASSIUM CHLORIDE CRYS ER 20 MEQ PO TBCR
40.0000 meq | EXTENDED_RELEASE_TABLET | Freq: Once | ORAL | Status: AC
  Administered 2013-09-19: 40 meq via ORAL
  Filled 2013-09-19: qty 2

## 2013-09-19 MED ORDER — METHYLPREDNISOLONE SODIUM SUCC 125 MG IJ SOLR: 125.0000 mg | Freq: Once | INTRAMUSCULAR | Status: DC

## 2013-09-19 MED ORDER — ALBUTEROL SULFATE HFA 108 (90 BASE) MCG/ACT IN AERS
2.0000 | INHALATION_SPRAY | Freq: Once | RESPIRATORY_TRACT | Status: AC
  Administered 2013-09-19: 2 via RESPIRATORY_TRACT
  Filled 2013-09-19: qty 6.7

## 2013-09-19 MED ORDER — POTASSIUM CHLORIDE CRYS ER 20 MEQ PO TBCR: 40.0000 meq | EXTENDED_RELEASE_TABLET | Freq: Every day | ORAL | Status: DC

## 2013-09-19 NOTE — ED Notes (Addendum)
Per EMS: patient c/o sob after "feeling bad" for a week.  lung sounds described as tight and wheezes, slightly diminished L side,  Given 5mg  albuterol, 125mg  solumedrol PTA, and currently on a douneb, afebrile.  EMS reports patient was hypertensive, 220/128 initially, second BP was 194/135, HR 120.   Currently patient is slightly tachypnic, expiratory wheezing, and diminished bilaterally, is able to speak in complete sentences.  Also has nasal drainage and there is swelling in the mid face on the right side.

## 2013-09-19 NOTE — ED Provider Notes (Signed)
CSN: [id number]     Arrival date & time 09/19/13  1156 History   First MD Initiated Contact with Patient 09/19/13 1159     Chief Complaint  Patient presents with  . Shortness of Breath  . Hypertension     (Consider location/radiation/quality/duration/timing/severity/associated sxs/prior Treatment) HPI Comments: Patient with history of asthma who is supposed to be on symbicort and allergra presents to the ED after running out of her inhaled medication and her rescue inhaler - she reports these symptoms have been on going for about 1 week - she states she has no insurance so she has no PCP to help her with her medications.  She denies fever, chills, reports chest tightness, productive cough with yellow sputum production.  She states that she has been having to sleep sitting upright over the past week as well.  Denies smoking and sick contacts at this time.  Patient is a 53 y.o. female presenting with shortness of breath and hypertension. The history is provided by the patient. No language interpreter was used.  Shortness of Breath Severity:  Moderate Onset quality:  Gradual Duration:  1 week Timing:  Constant Progression:  Worsening Chronicity:  New Context: not activity, not emotional upset, not known allergens, not occupational exposure, not smoke exposure, not strong odors, not URI and not weather changes   Relieved by:  Nothing Worsened by:  Nothing tried Ineffective treatments:  None tried Associated symptoms: cough, sputum production and wheezing   Associated symptoms: no abdominal pain, no chest pain, no diaphoresis, no fever, no headaches, no neck pain, no PND, no sore throat and no vomiting   Hypertension Associated symptoms include coughing. Pertinent negatives include no abdominal pain, chest pain, diaphoresis, fever, headaches, neck pain, sore throat or vomiting.    Past Medical History  Diagnosis Date  . Hypertension   . Environmental allergies   . Asthma    Past  Surgical History  Procedure Laterality Date  . Abdominal hysterectomy     No family history on file. History  Substance Use Topics  . Smoking status: Never Smoker   . Smokeless tobacco: Not on file  . Alcohol Use: No   OB History   Grav Para Term Preterm Abortions TAB SAB Ect Mult Living                 Review of Systems  Constitutional: Negative for fever and diaphoresis.  HENT: Negative for sore throat.   Respiratory: Positive for cough, sputum production, shortness of breath and wheezing.   Cardiovascular: Negative for chest pain and PND.  Gastrointestinal: Negative for vomiting and abdominal pain.  Musculoskeletal: Negative for neck pain.  Neurological: Negative for headaches.  All other systems reviewed and are negative.      Allergies  Ace inhibitors; Ibuprofen; Lipitor; and Nsaids  Home Medications   Current Outpatient Rx  Name  Route  Sig  Dispense  Refill  . budesonide-formoterol (SYMBICORT) 160-4.5 MCG/ACT inhaler   Inhalation   Inhale 2 puffs into the lungs 2 (two) times daily.   1 Inhaler   1   . fexofenadine (ALLEGRA) 180 MG tablet   Oral   Take 180 mg by mouth daily.         Marland Kitchen loratadine (CLARITIN) 10 MG tablet   Oral   Take 10 mg by mouth daily.         . predniSONE (DELTASONE) 50 MG tablet   Oral   Take 1 tablet (50 mg total) by mouth daily.  5 tablet   0    BP 168/96  Pulse 104  Temp(Src) 97.5 F (36.4 C) (Oral)  Resp 21  Ht 5' (1.524 m)  Wt 130 lb (58.968 kg)  BMI 25.39 kg/m2  SpO2 99% Physical Exam  Nursing note and vitals reviewed. Constitutional: She is oriented to person, place, and time. She appears well-developed and well-nourished. No distress.  HENT:  Head: Normocephalic and atraumatic.  Right Ear: External ear normal.  Left Ear: External ear normal.  Mouth/Throat: Oropharynx is clear and moist. No oropharyngeal exudate.  Marked mucosa edema in nares  Eyes: Conjunctivae are normal. Pupils are equal, round, and  reactive to light. No scleral icterus.  Neck: Normal range of motion. Neck supple.  Cardiovascular: Normal rate, regular rhythm and normal heart sounds.  Exam reveals no gallop and no friction rub.   No murmur heard. Pulmonary/Chest: Effort normal. No respiratory distress. She has wheezes. She has no rales. She exhibits no tenderness.  Diffuse bilateral expiratory wheezing  Abdominal: Soft. Bowel sounds are normal. She exhibits no distension. There is no tenderness. There is no rebound and no guarding.  Musculoskeletal: Normal range of motion. She exhibits no edema and no tenderness.  Lymphadenopathy:    She has no cervical adenopathy.  Neurological: She is alert and oriented to person, place, and time. She exhibits normal muscle tone. Coordination normal.  Skin: Skin is warm and dry. No rash noted. No erythema. No pallor.  Psychiatric: She has a normal mood and affect. Her behavior is normal. Judgment and thought content normal.    ED Course  Procedures (including critical care time) Labs Review Labs Reviewed  CBC WITH DIFFERENTIAL   Imaging Review No results found.  EKG Interpretation    Date/Time:  Sunday September 19 2013 12:07:15 EST Ventricular Rate:  94 PR Interval:  179 QRS Duration: 85 QT Interval:  368 QTC Calculation: 460 R Axis:   71 Text Interpretation:  Sinus rhythm Consider right atrial enlargement Probable LVH with secondary repol abnrm No changes vs 06-05-2012 Confirmed by Jeneen Rinks  MD, [place] ([id number]) on 09/19/2013 12:13:18 PM           2:50 PM Breathing improved, still with mild basilar expiratory wheezing noted. MDM   Asthma exacerbation Hypokalemia  Patient here with worsening shortness of breath after running out of her inhaler.  Improved with breathing treatment here and albuterol.  Also noted with hypokalemia, given oral repacement here and started on medication.      Idalia Needle Joelyn Oms, [place] 09/19/13 [phone]

## 2013-09-19 NOTE — Discharge Instructions (Signed)
Asthma, Adult Asthma is a recurring condition in which the airways tighten and narrow. Asthma can make it difficult to breathe. It can cause coughing, wheezing, and shortness of breath. Asthma episodes (also called asthma attacks) range from minor to life-threatening. Asthma cannot be cured, but medicines and lifestyle changes can help control it. CAUSES Asthma is believed to be caused by inherited (genetic) and environmental factors, but its exact cause is unknown. Asthma may be triggered by allergens, lung infections, or irritants in the air. Asthma triggers are different for each person. Common triggers include:   Animal dander.  Dust mites.  Cockroaches.  Pollen from trees or grass.  Mold.  Smoke.  Air pollutants such as dust, household cleaners, hair sprays, aerosol sprays, paint fumes, strong chemicals, or strong odors.  Cold air, weather changes, and winds (which increase molds and pollens in the air).  Strong emotional expressions such as crying or laughing hard.  Stress.  Certain medicines (such as aspirin) or types of drugs (such as beta-blockers).  Sulfites in foods and drinks. Foods and drinks that may contain sulfites include dried fruit, potato chips, and sparkling grape juice.  Infections or inflammatory conditions such as the flu, a cold, or an inflammation of the nasal membranes (rhinitis).  Gastroesophageal reflux disease (GERD).  Exercise or strenuous activity. SYMPTOMS Symptoms may occur immediately after asthma is triggered or many hours later. Symptoms include:  Wheezing.  Excessive nighttime or early morning coughing.  Frequent or severe coughing with a common cold.  Chest tightness.  Shortness of breath. DIAGNOSIS  The diagnosis of asthma is made by a review of your medical history and a physical exam. Tests may also be performed. These may include:  Lung function studies. These tests show how much air you breath in and out.  Allergy  tests.  Imaging tests such as X-rays. TREATMENT  Asthma cannot be cured, but it can usually be controlled. Treatment involves identifying and avoiding your asthma triggers. It also involves medicines. There are 2 classes of medicine used for asthma treatment:   Controller medicines. These prevent asthma symptoms from occurring. They are usually taken every day.  Reliever or rescue medicines. These quickly relieve asthma symptoms. They are used as needed and provide short-term relief. Your health care provider will help you create an asthma action plan. An asthma action plan is a written plan for managing and treating your asthma attacks. It includes a list of your asthma triggers and how they may be avoided. It also includes information on when medicines should be taken and when their dosage should be changed. An action plan may also involve the use of a device called a peak flow meter. A peak flow meter measures how well the lungs are working. It helps you monitor your condition. HOME CARE INSTRUCTIONS   Take medicine as directed by your health care provider. Speak with your health care provider if you have questions about how or when to take the medicines.  Use a peak flow meter as directed by your health care provider. Record and keep track of readings.  Understand and use the action plan to help minimize or stop an asthma attack without needing to seek medical care.  Control your home environment in the following ways to help prevent asthma attacks:  Do not smoke. Avoid being exposed to secondhand smoke.  Change your heating and air conditioning filter regularly.  Limit your use of fireplaces and wood stoves.  Get rid of pests (such as roaches and  mice) and their droppings.  Throw away plants if you see mold on them.  Clean your floors and dust regularly. Use unscented cleaning products.  Try to have someone else vacuum for you regularly. Stay out of rooms while they are being  vacuumed and for a short while afterward. If you vacuum, use a dust mask from a hardware store, a double-layered or microfilter vacuum cleaner bag, or a vacuum cleaner with a HEPA filter.  Replace carpet with wood, tile, or vinyl flooring. Carpet can trap dander and dust.  Use allergy-proof pillows, mattress covers, and box spring covers.  Wash bed sheets and blankets every week in hot water and dry them in a dryer.  Use blankets that are made of polyester or cotton.  Clean bathrooms and kitchens with bleach. If possible, have someone repaint the walls in these rooms with mold-resistant paint. Keep out of the rooms that are being cleaned and painted.  Wash hands frequently. SEEK MEDICAL CARE IF:   You have wheezing, shortness of breath, or a cough even if taking medicine to prevent attacks.  The colored mucus you cough up (sputum) is thicker than usual.  Your sputum changes from clear or white to yellow, green, gray, or bloody.  You have any problems that may be related to the medicines you are taking (such as a rash, itching, swelling, or trouble breathing).  You are using a reliever medicine more than 2 3 times per week.  Your peak flow is still at 50 79% of you personal best after following your action plan for 1 hour. SEEK IMMEDIATE MEDICAL CARE IF:   You seem to be getting worse and are unresponsive to treatment during an asthma attack.  You are short of breath even at rest.  You get short of breath when doing very little physical activity.  You have difficulty eating, drinking, or talking due to asthma symptoms.  You develop chest pain.  You develop a fast heartbeat.  You have a bluish color to your lips or fingernails.  You are lightheaded, dizzy, or faint.  Your peak flow is less than 50% of your personal best.  You have a fever or persistent symptoms for more than 2 3 days.  You have a fever and symptoms suddenly get worse. MAKE SURE YOU:   Understand these  instructions.  Will watch your condition.  Will get help right away if you are not doing well or get worse. Document Released: 07/22/2005 Document Revised: 03/24/2013 Document Reviewed: 02/18/2013 [facility] Patient Information 2014 [place], [place].  Hypokalemia Hypokalemia means that the amount of potassium in the blood is lower than normal.Potassium is a chemical, called an electrolyte, that helps regulate the amount of fluid in the body. It also stimulates muscle contraction and helps nerves function properly.Most of the body's potassium is inside of cells, and only a very small amount is in the blood. Because the amount in the blood is so small, minor changes can be life-threatening. CAUSES  Antibiotics.  Diarrhea or vomiting.  Using laxatives too much, which can cause diarrhea.  Chronic kidney disease.  Water pills (diuretics).  Eating disorders (bulimia).  Low magnesium level.  Sweating a lot. SIGNS AND SYMPTOMS  Weakness.  Constipation.  Fatigue.  Muscle cramps.  Mental confusion.  Skipped heartbeats or irregular heartbeat (palpitations).  Tingling or numbness. DIAGNOSIS  Your health care provider can diagnose hypokalemia with blood tests. In addition to checking your potassium level, your health care provider may also check other lab tests.  TREATMENT Hypokalemia can be treated with potassium supplements taken by mouth or adjustments in your current medicines. If your potassium level is very low, you may need to get potassium through a vein (IV) and be monitored in the hospital. A diet high in potassium is also helpful. Foods high in potassium are:  Nuts, such as peanuts and pistachios.  Seeds, such as sunflower seeds and pumpkin seeds.  Peas, lentils, and lima beans.  Whole grain and bran cereals and breads.  Fresh fruit and vegetables, such as apricots, avocado, bananas, cantaloupe, kiwi, oranges, tomatoes, asparagus, and potatoes.  Orange and tomato  juices.  Red meats.  Fruit yogurt. HOME CARE INSTRUCTIONS  Take all medicines as prescribed by your health care provider.  Maintain a healthy diet by including nutritious food, such as fruits, vegetables, nuts, whole grains, and lean meats.  If you are taking a laxative, be sure to follow the directions on the label. SEEK MEDICAL CARE IF:  Your weakness gets worse.  You feel your heart pounding or racing.  You are vomiting or having diarrhea.  You are diabetic and having trouble keeping your blood glucose in the normal range. SEEK IMMEDIATE MEDICAL CARE IF:  You have chest pain, shortness of breath, or dizziness.  You are vomiting or having diarrhea for more than 2 days.  You faint. MAKE SURE YOU:   Understand these instructions.  Will watch your condition.  Will get help right away if you are not doing well or get worse. Document Released: 07/22/2005 Document Revised: 05/12/2013 Document Reviewed: 01/22/2013 [facility] Patient Information 2014 [place].

## 2013-09-23 NOTE — ED Provider Notes (Signed)
Medical screening examination/treatment/procedure(s) were performed by non-physician practitioner and as supervising physician I was immediately available for consultation/collaboration.  EKG Interpretation    Date/Time:  Sunday September 19 2013 12:07:15 EST Ventricular Rate:  94 PR Interval:  179 QRS Duration: 85 QT Interval:  368 QTC Calculation: 460 R Axis:   71 Text Interpretation:  Sinus rhythm Consider right atrial enlargement Probable LVH with secondary repol abnrm No changes vs 06-05-2012 Confirmed by Jeneen Rinks  MD, [place] ([id number]) on 09/19/2013 12:13:18 PM              Tanna Furry, MD 09/23/13 [phone]

## 2014-04-05 HISTORY — PX: TRANSTHORACIC ECHOCARDIOGRAM: SHX275

## 2014-04-15 ENCOUNTER — Inpatient Hospital Stay (HOSPITAL_COMMUNITY)
Admission: EM | Admit: 2014-04-15 | Discharge: 2014-04-25 | DRG: 915 | Disposition: A | Discharge status: Skilled Nursing Facility | Payer: No Typology Code available for payment source | Attending: Internal Medicine | Admitting: Internal Medicine

## 2014-04-15 ENCOUNTER — Encounter (HOSPITAL_COMMUNITY): Payer: Self-pay | Admitting: Emergency Medicine

## 2014-04-15 ENCOUNTER — Inpatient Hospital Stay (HOSPITAL_COMMUNITY): Payer: No Typology Code available for payment source

## 2014-04-15 ENCOUNTER — Emergency Department (HOSPITAL_COMMUNITY): Payer: No Typology Code available for payment source

## 2014-04-15 DIAGNOSIS — E872 Acidosis, unspecified: Secondary | ICD-10-CM | POA: Diagnosis present

## 2014-04-15 DIAGNOSIS — G9340 Encephalopathy, unspecified: Secondary | ICD-10-CM | POA: Diagnosis present

## 2014-04-15 DIAGNOSIS — T782XXS Anaphylactic shock, unspecified, sequela: Secondary | ICD-10-CM

## 2014-04-15 DIAGNOSIS — Z9109 Other allergy status, other than to drugs and biological substances: Secondary | ICD-10-CM | POA: Diagnosis not present

## 2014-04-15 DIAGNOSIS — R7309 Other abnormal glucose: Secondary | ICD-10-CM | POA: Diagnosis not present

## 2014-04-15 DIAGNOSIS — T39095A Adverse effect of salicylates, initial encounter: Secondary | ICD-10-CM | POA: Diagnosis present

## 2014-04-15 DIAGNOSIS — IMO0002 Reserved for concepts with insufficient information to code with codable children: Secondary | ICD-10-CM

## 2014-04-15 DIAGNOSIS — J45902 Unspecified asthma with status asthmaticus: Secondary | ICD-10-CM

## 2014-04-15 DIAGNOSIS — I10 Essential (primary) hypertension: Secondary | ICD-10-CM

## 2014-04-15 DIAGNOSIS — R1310 Dysphagia, unspecified: Secondary | ICD-10-CM | POA: Diagnosis present

## 2014-04-15 DIAGNOSIS — T782XXA Anaphylactic shock, unspecified, initial encounter: Secondary | ICD-10-CM | POA: Diagnosis present

## 2014-04-15 DIAGNOSIS — E870 Hyperosmolality and hypernatremia: Secondary | ICD-10-CM | POA: Diagnosis present

## 2014-04-15 DIAGNOSIS — N179 Acute kidney failure, unspecified: Secondary | ICD-10-CM | POA: Diagnosis not present

## 2014-04-15 DIAGNOSIS — R000 Tachycardia, unspecified: Secondary | ICD-10-CM | POA: Diagnosis not present

## 2014-04-15 DIAGNOSIS — T380X5A Adverse effect of glucocorticoids and synthetic analogues, initial encounter: Secondary | ICD-10-CM | POA: Diagnosis not present

## 2014-04-15 DIAGNOSIS — T7840XA Allergy, unspecified, initial encounter: Secondary | ICD-10-CM

## 2014-04-15 DIAGNOSIS — J9600 Acute respiratory failure, unspecified whether with hypoxia or hypercapnia: Secondary | ICD-10-CM | POA: Diagnosis present

## 2014-04-15 DIAGNOSIS — E876 Hypokalemia: Secondary | ICD-10-CM | POA: Diagnosis present

## 2014-04-15 DIAGNOSIS — J9602 Acute respiratory failure with hypercapnia: Secondary | ICD-10-CM

## 2014-04-15 DIAGNOSIS — Z886 Allergy status to analgesic agent status: Secondary | ICD-10-CM

## 2014-04-15 DIAGNOSIS — R410 Disorientation, unspecified: Secondary | ICD-10-CM

## 2014-04-15 HISTORY — DX: Acute respiratory failure, unspecified whether with hypoxia or hypercapnia: J96.00

## 2014-04-15 HISTORY — DX: Shortness of breath: R06.02

## 2014-04-15 HISTORY — DX: Anxiety disorder, unspecified: F41.9

## 2014-04-15 HISTORY — DX: Headache: R51

## 2014-04-15 HISTORY — DX: Gastro-esophageal reflux disease without esophagitis: K21.9

## 2014-04-15 LAB — COMPREHENSIVE METABOLIC PANEL
ALBUMIN: 3.5 g/dL (ref 3.5–5.2)
ALT: 23 U/L (ref 0–35)
AST: 30 U/L (ref 0–37)
Alkaline Phosphatase: 112 U/L (ref 39–117)
Anion gap: 16 — ABNORMAL HIGH (ref 5–15)
BUN: 6 mg/dL (ref 6–23)
CALCIUM: 8.5 mg/dL (ref 8.4–10.5)
CO2: 23 mEq/L (ref 19–32)
Chloride: 100 mEq/L (ref 96–112)
Creatinine, Ser: 0.88 mg/dL (ref 0.50–1.10)
GFR calc Af Amer: 84 mL/min — ABNORMAL LOW (ref >90)
GFR calc non Af Amer: 73 mL/min — ABNORMAL LOW (ref >90)
Glucose, Bld: 280 mg/dL — ABNORMAL HIGH (ref 70–99)
Potassium: 2.9 mEq/L — CL (ref 3.7–5.3)
Sodium: 139 mEq/L (ref 137–147)
Total Bilirubin: 1.1 mg/dL (ref 0.3–1.2)
Total Protein: 7.4 g/dL (ref 6.0–8.3)

## 2014-04-15 LAB — URINALYSIS, ROUTINE W REFLEX MICROSCOPIC
BILIRUBIN URINE: NEGATIVE
GLUCOSE, UA: NEGATIVE mg/dL
Ketones, ur: NEGATIVE mg/dL
Leukocytes, UA: NEGATIVE
Nitrite: NEGATIVE
Protein, ur: 30 mg/dL — AB
SPECIFIC GRAVITY, URINE: 1.014 (ref 1.005–1.030)
UROBILINOGEN UA: 0.2 mg/dL (ref 0.0–1.0)
pH: 7.5 (ref 5.0–8.0)

## 2014-04-15 LAB — CBC WITH DIFFERENTIAL/PLATELET
BASOS ABS: 0.1 10*3/uL (ref 0.0–0.1)
Basophils Relative: 1 % (ref 0–1)
EOS ABS: 0.8 10*3/uL — AB (ref 0.0–0.7)
EOS PCT: 9 % — AB (ref 0–5)
HCT: 40.8 % (ref 36.0–46.0)
Hemoglobin: 14.1 g/dL (ref 12.0–15.0)
Lymphocytes Relative: 41 % (ref 12–46)
Lymphs Abs: 3.6 10*3/uL (ref 0.7–4.0)
MCH: 28.2 pg (ref 26.0–34.0)
MCHC: 34.6 g/dL (ref 30.0–36.0)
MCV: 81.6 fL (ref 78.0–100.0)
MONO ABS: 0.6 10*3/uL (ref 0.1–1.0)
MONOS PCT: 7 % (ref 3–12)
Neutro Abs: 3.6 10*3/uL (ref 1.7–7.7)
Neutrophils Relative %: 42 % — ABNORMAL LOW (ref 43–77)
Platelets: 318 10*3/uL (ref 150–400)
RBC: 5 MIL/uL (ref 3.87–5.11)
RDW: 12.5 % (ref 11.5–15.5)
WBC: 8.7 10*3/uL (ref 4.0–10.5)

## 2014-04-15 LAB — RAPID URINE DRUG SCREEN, HOSP PERFORMED
AMPHETAMINES: NOT DETECTED
BARBITURATES: NOT DETECTED
BENZODIAZEPINES: NOT DETECTED
COCAINE: NOT DETECTED
OPIATES: NOT DETECTED
TETRAHYDROCANNABINOL: NOT DETECTED

## 2014-04-15 LAB — I-STAT ARTERIAL BLOOD GAS, ED
ACID-BASE DEFICIT: 2 mmol/L (ref 0.0–2.0)
ACID-BASE EXCESS: 3 mmol/L — AB (ref 0.0–2.0)
BICARBONATE: 29.8 meq/L — AB (ref 20.0–24.0)
BICARBONATE: 27.9 meq/L — AB (ref 20.0–24.0)
O2 SAT: 99 %
O2 Saturation: 100 %
PH ART: 7.426 (ref 7.350–7.450)
PO2 ART: 204 mmHg — AB (ref 80.0–100.0)
TCO2: 32 mmol/L (ref 0–100)
TCO2: 29 mmol/L (ref 0–100)
pCO2 arterial: 88.4 mmHg (ref 35.0–45.0)
pCO2 arterial: 42.3 mmHg (ref 35.0–45.0)
pH, Arterial: 7.135 — CL (ref 7.350–7.450)
pO2, Arterial: 246 mmHg — ABNORMAL HIGH (ref 80.0–100.0)

## 2014-04-15 LAB — GLUCOSE, CAPILLARY
GLUCOSE-CAPILLARY: 134 mg/dL — AB (ref 70–99)
Glucose-Capillary: 205 mg/dL — ABNORMAL HIGH (ref 70–99)

## 2014-04-15 LAB — PRO B NATRIURETIC PEPTIDE: Pro B Natriuretic peptide (BNP): 26.9 pg/mL (ref 0–125)

## 2014-04-15 LAB — STREP PNEUMONIAE URINARY ANTIGEN: Strep Pneumo Urinary Antigen: NEGATIVE

## 2014-04-15 LAB — URINE MICROSCOPIC-ADD ON

## 2014-04-15 LAB — TROPONIN I: Troponin I: <0.3 ng/mL (ref <0.30)

## 2014-04-15 LAB — TRIGLYCERIDES: TRIGLYCERIDES: 120 mg/dL (ref <150)

## 2014-04-15 LAB — MRSA PCR SCREENING: MRSA BY PCR: NEGATIVE

## 2014-04-15 LAB — D-DIMER, QUANTITATIVE: D-Dimer, Quant: 1.96 ug/mL-FEU — ABNORMAL HIGH (ref 0.00–0.48)

## 2014-04-15 MED ORDER — ALBUTEROL SULFATE (2.5 MG/3ML) 0.083% IN NEBU
2.5000 mg | INHALATION_SOLUTION | Freq: Once | RESPIRATORY_TRACT | Status: DC
Start: 2014-04-15 — End: 2014-04-15

## 2014-04-15 MED ORDER — DEXTROSE 5 % IV SOLN
1.0000 g | INTRAVENOUS | Status: DC
  Administered 2014-04-15 – 2014-04-16 (×2): 1 g via INTRAVENOUS
  Filled 2014-04-15 (×3): qty 10

## 2014-04-15 MED ORDER — VITAL HIGH PROTEIN PO LIQD
1000.0000 mL | ORAL | Status: DC
  Administered 2014-04-15: 1000 mL
  Filled 2014-04-15 (×3): qty 1000

## 2014-04-15 MED ORDER — ETOMIDATE 2 MG/ML IV SOLN
INTRAVENOUS | Status: AC
Start: 2014-04-15 — End: 2014-04-16
  Filled 2014-04-15: qty 20

## 2014-04-15 MED ORDER — INSULIN ASPART 100 UNIT/ML ~~LOC~~ SOLN
0.0000 [IU] | SUBCUTANEOUS | Status: DC
  Administered 2014-04-15: 5 [IU] via SUBCUTANEOUS
  Administered 2014-04-15: 2 [IU] via SUBCUTANEOUS
  Administered 2014-04-16 (×2): 3 [IU] via SUBCUTANEOUS
  Administered 2014-04-16: 2 [IU] via SUBCUTANEOUS
  Administered 2014-04-16 (×2): 3 [IU] via SUBCUTANEOUS
  Administered 2014-04-16 – 2014-04-17 (×2): 2 [IU] via SUBCUTANEOUS
  Administered 2014-04-17: 3 [IU] via SUBCUTANEOUS
  Administered 2014-04-17 (×4): 2 [IU] via SUBCUTANEOUS
  Administered 2014-04-18: 3 [IU] via SUBCUTANEOUS
  Administered 2014-04-18: 2 [IU] via SUBCUTANEOUS

## 2014-04-15 MED ORDER — SUCCINYLCHOLINE CHLORIDE 20 MG/ML IJ SOLN
INTRAMUSCULAR | Status: AC
  Filled 2014-04-15: qty 1

## 2014-04-15 MED ORDER — LIDOCAINE HCL (CARDIAC) 20 MG/ML IV SOLN
INTRAVENOUS | Status: AC
  Filled 2014-04-15: qty 5

## 2014-04-15 MED ORDER — DEXTROSE 5 % IV SOLN
500.0000 mg | INTRAVENOUS | Status: DC
  Administered 2014-04-15 – 2014-04-17 (×3): 500 mg via INTRAVENOUS
  Filled 2014-04-15 (×4): qty 500

## 2014-04-15 MED ORDER — SODIUM CHLORIDE 0.9 % IV SOLN: 250.0000 mL | INTRAVENOUS | Status: DC | PRN

## 2014-04-15 MED ORDER — MAGNESIUM SULFATE 40 MG/ML IJ SOLN
2.0000 g | Freq: Once | INTRAMUSCULAR | Status: AC
  Administered 2014-04-15: 2 g via INTRAVENOUS
  Filled 2014-04-15: qty 50

## 2014-04-15 MED ORDER — FAMOTIDINE IN NACL 20-0.9 MG/50ML-% IV SOLN
20.0000 mg | Freq: Two times a day (BID) | INTRAVENOUS | Status: DC
  Administered 2014-04-15 – 2014-04-17 (×5): 20 mg via INTRAVENOUS
  Filled 2014-04-15 (×8): qty 50

## 2014-04-15 MED ORDER — DIPHENHYDRAMINE HCL 50 MG/ML IJ SOLN
INTRAMUSCULAR | Status: AC | PRN
  Administered 2014-04-15: 25 mg via INTRAVENOUS

## 2014-04-15 MED ORDER — DIPHENHYDRAMINE HCL 50 MG/ML IJ SOLN
INTRAMUSCULAR | Status: AC
  Administered 2014-04-15: 50 mg
  Filled 2014-04-15: qty 1

## 2014-04-15 MED ORDER — ALBUTEROL (5 MG/ML) CONTINUOUS INHALATION SOLN
15.0000 mg/h | INHALATION_SOLUTION | Freq: Once | RESPIRATORY_TRACT | Status: DC
  Filled 2014-04-15: qty 60

## 2014-04-15 MED ORDER — IPRATROPIUM BROMIDE 0.02 % IN SOLN
0.5000 mg | Freq: Once | RESPIRATORY_TRACT | Status: AC
  Administered 2014-04-15: 0.5 mg via RESPIRATORY_TRACT
  Filled 2014-04-15: qty 2.5

## 2014-04-15 MED ORDER — SUCCINYLCHOLINE CHLORIDE 20 MG/ML IJ SOLN
INTRAMUSCULAR | Status: AC | PRN
  Administered 2014-04-15: 100 mg via INTRAVENOUS

## 2014-04-15 MED ORDER — EPINEPHRINE 0.3 MG/0.3ML IJ SOAJ
0.3000 mg | Freq: Once | INTRAMUSCULAR | Status: AC
  Administered 2014-04-15: 0.3 mg via INTRAMUSCULAR
  Filled 2014-04-15: qty 0.3

## 2014-04-15 MED ORDER — ALBUTEROL SULFATE (2.5 MG/3ML) 0.083% IN NEBU
5.0000 mg | INHALATION_SOLUTION | Freq: Once | RESPIRATORY_TRACT | Status: AC
  Administered 2014-04-15: 5 mg via RESPIRATORY_TRACT

## 2014-04-15 MED ORDER — POTASSIUM CHLORIDE 20 MEQ/15ML (10%) PO LIQD
40.0000 meq | Freq: Two times a day (BID) | ORAL | Status: AC
  Administered 2014-04-15 – 2014-04-16 (×2): 40 meq
  Filled 2014-04-15 (×2): qty 30

## 2014-04-15 MED ORDER — NICARDIPINE HCL IN NACL 20-0.86 MG/200ML-% IV SOLN
3.0000 mg/h | Freq: Once | INTRAVENOUS | Status: AC
  Administered 2014-04-15: 5 mg/h via INTRAVENOUS
  Filled 2014-04-15: qty 200

## 2014-04-15 MED ORDER — FENTANYL CITRATE 0.05 MG/ML IJ SOLN
100.0000 ug | INTRAMUSCULAR | Status: DC | PRN
  Administered 2014-04-15 (×2): 100 ug via INTRAVENOUS
  Filled 2014-04-15 (×3): qty 2

## 2014-04-15 MED ORDER — IPRATROPIUM-ALBUTEROL 0.5-2.5 (3) MG/3ML IN SOLN
3.0000 mL | Freq: Four times a day (QID) | RESPIRATORY_TRACT | Status: DC
  Administered 2014-04-15 – 2014-04-19 (×16): 3 mL via RESPIRATORY_TRACT
  Filled 2014-04-15 (×17): qty 3

## 2014-04-15 MED ORDER — SODIUM CHLORIDE 0.9 % IV SOLN: INTRAVENOUS | Status: DC

## 2014-04-15 MED ORDER — ROCURONIUM BROMIDE 50 MG/5ML IV SOLN
INTRAVENOUS | Status: AC
  Administered 2014-04-15: 70 mg
  Filled 2014-04-15: qty 2

## 2014-04-15 MED ORDER — HEPARIN SODIUM (PORCINE) 5000 UNIT/ML IJ SOLN
5000.0000 [IU] | Freq: Three times a day (TID) | INTRAMUSCULAR | Status: DC
  Administered 2014-04-15 – 2014-04-25 (×30): 5000 [IU] via SUBCUTANEOUS
  Filled 2014-04-15 (×33): qty 1

## 2014-04-15 MED ORDER — PROPOFOL 10 MG/ML IV EMUL
INTRAVENOUS | Status: AC
  Administered 2014-04-15: 1000 mg
  Filled 2014-04-15: qty 100

## 2014-04-15 MED ORDER — HYDRALAZINE HCL 20 MG/ML IJ SOLN
10.0000 mg | INTRAMUSCULAR | Status: DC | PRN
Start: 2014-04-15 — End: 2014-04-25
  Administered 2014-04-18 (×2): 40 mg via INTRAVENOUS
  Administered 2014-04-18 (×2): 20 mg via INTRAVENOUS
  Filled 2014-04-15: qty 2
  Filled 2014-04-15 (×2): qty 1
  Filled 2014-04-15: qty 2

## 2014-04-15 MED ORDER — PROPOFOL 10 MG/ML IV EMUL
0.0000 ug/kg/min | INTRAVENOUS | Status: DC
  Administered 2014-04-16 (×3): 50 ug/kg/min via INTRAVENOUS
  Administered 2014-04-16: 40 ug/kg/min via INTRAVENOUS
  Filled 2014-04-15 (×2): qty 100
  Filled 2014-04-15 (×2): qty 200
  Filled 2014-04-15: qty 100

## 2014-04-15 MED ORDER — PROPOFOL 10 MG/ML IV EMUL: 0.0000 ug/kg/min | INTRAVENOUS | Status: DC

## 2014-04-15 MED ORDER — METHYLPREDNISOLONE SODIUM SUCC 125 MG IJ SOLR
125.0000 mg | Freq: Once | INTRAMUSCULAR | Status: AC
  Administered 2014-04-15: 125 mg via INTRAVENOUS
  Filled 2014-04-15: qty 2

## 2014-04-15 MED ORDER — CHLORHEXIDINE GLUCONATE 0.12 % MT SOLN
15.0000 mL | Freq: Two times a day (BID) | OROMUCOSAL | Status: DC
  Administered 2014-04-15 – 2014-04-18 (×7): 15 mL via OROMUCOSAL
  Filled 2014-04-15 (×6): qty 15

## 2014-04-15 MED ORDER — DIPHENHYDRAMINE HCL 50 MG/ML IJ SOLN
25.0000 mg | Freq: Three times a day (TID) | INTRAMUSCULAR | Status: DC
  Administered 2014-04-15 – 2014-04-17 (×5): 25 mg via INTRAVENOUS
  Filled 2014-04-15 (×2): qty 0.5
  Filled 2014-04-15: qty 1
  Filled 2014-04-15 (×6): qty 0.5

## 2014-04-15 MED ORDER — CETYLPYRIDINIUM CHLORIDE 0.05 % MT LIQD
7.0000 mL | Freq: Four times a day (QID) | OROMUCOSAL | Status: DC
  Administered 2014-04-16 – 2014-04-19 (×14): 7 mL via OROMUCOSAL

## 2014-04-15 MED ORDER — ETOMIDATE 2 MG/ML IV SOLN
INTRAVENOUS | Status: AC | PRN
  Administered 2014-04-15: 40 mg via INTRAVENOUS

## 2014-04-15 MED ORDER — FENTANYL CITRATE 0.05 MG/ML IJ SOLN
25.0000 ug/h | INTRAMUSCULAR | Status: DC
  Administered 2014-04-15: 25 ug/h via INTRAVENOUS
  Administered 2014-04-16 – 2014-04-17 (×2): 150 ug/h via INTRAVENOUS
  Administered 2014-04-18: 200 ug/h via INTRAVENOUS
  Filled 2014-04-15 (×5): qty 50

## 2014-04-15 MED ORDER — IPRATROPIUM BROMIDE 0.02 % IN SOLN
RESPIRATORY_TRACT | Status: AC
  Administered 2014-04-15: 16:00:00
  Filled 2014-04-15: qty 2.5

## 2014-04-15 MED ORDER — METHYLPREDNISOLONE SODIUM SUCC 125 MG IJ SOLR
60.0000 mg | Freq: Three times a day (TID) | INTRAMUSCULAR | Status: DC
Start: 2014-04-15 — End: 2014-04-17
  Administered 2014-04-15 – 2014-04-17 (×6): 60 mg via INTRAVENOUS
  Filled 2014-04-15 (×6): qty 0.96
  Filled 2014-04-15: qty 2
  Filled 2014-04-15 (×2): qty 0.96

## 2014-04-15 MED ORDER — ALBUTEROL SULFATE (2.5 MG/3ML) 0.083% IN NEBU
2.5000 mg | INHALATION_SOLUTION | RESPIRATORY_TRACT | Status: DC | PRN
  Administered 2014-04-18 (×2): 2.5 mg via RESPIRATORY_TRACT
  Filled 2014-04-15 (×2): qty 3

## 2014-04-15 MED ORDER — SODIUM CHLORIDE 0.9 % IV SOLN
INTRAVENOUS | Status: DC
  Administered 2014-04-15: 17:00:00 via INTRAVENOUS

## 2014-04-15 NOTE — ED Notes (Addendum)
Patient at pcp today for cough/congestion and cp, patient sent to ED for abnormal ekg, patient also states intermittent headache x few days, patient with productive cough yielding green/yellow sputum, patient received with nitro x 3 and ASA 324 mg per ems pta

## 2014-04-15 NOTE — Progress Notes (Signed)
RT note- Changed vent setting now for improved VT's in the 500's, Patient is currently back to her 8cc/kg, with good PIP/Plat. Pressures. ABG in 30 min

## 2014-04-15 NOTE — ED Provider Notes (Addendum)
CSN: [id number]     Arrival date & time 04/15/14  1242 History   First MD Initiated Contact with Patient 04/15/14 1251     Chief Complaint  Patient presents with  . Chest Pain  . Nasal Congestion  . Cough  . Headache   Level V caveat for acute respiratory distress.  (Consider location/radiation/quality/duration/timing/severity/associated sxs/prior Treatment) HPI Patient apparently went to her doctor's office today, she cannot tell me who her doctor is, for cough and congestion. The staff who initially saw patient states she was able to answer questions. When I entered the room the patient is in severe respiratory distress. She is standing on her knees with her body over the railing of her bed saying over and over again "I need to pee" patient is struggling to breathe. Patient did indicate she was given aspirin by EMS and she states she is allergic to aspirin, we do not have aspirin listed as an allergy but she is allergic to nonsteroidal anti-inflammatory drugs. Patient states she has a nebulizer at home but she doesn't have anything to use in it.  PCP unknown  Past Medical History  Diagnosis Date  . Hypertension   . Environmental allergies   . Asthma    Past Surgical History  Procedure Laterality Date  . Abdominal hysterectomy     No family history on file. History  Substance Use Topics  . Smoking status: Never Smoker   . Smokeless tobacco: Not on file  . Alcohol Use: No   No home oxygen  OB History   Grav Para Term Preterm Abortions TAB SAB Ect Mult Living                 Review of Systems  Unable to perform ROS: Severe respiratory distress      Allergies  Aspirin; Ibuprofen; Nsaids; Ace inhibitors; and Lipitor  Home Medications   Prior to Admission medications   Medication Sig Start Date End Date Taking? Authorizing Provider  albuterol (PROVENTIL HFA;VENTOLIN HFA) 108 (90 BASE) MCG/ACT inhaler Inhale 1 puff into the lungs every 6 (six) hours as needed for  wheezing or shortness of breath.   Yes Historical Provider, MD  carbamide peroxide (DEBROX) 6.5 % otic solution Place 4 drops into both ears daily.   Yes Historical Provider, MD  fluticasone (FLOVENT HFA) 220 MCG/ACT inhaler Inhale 1 puff into the lungs 2 (two) times daily.   Yes Historical Provider, MD  lisinopril-hydrochlorothiazide (PRINZIDE,ZESTORETIC) 10-12.5 MG per tablet Take 1 tablet by mouth daily.   Yes Historical Provider, MD  fluticasone (FLONASE) 50 MCG/ACT nasal spray Place 1 spray into both nostrils daily. 04/11/14   Historical Provider, MD  potassium chloride SA (K-DUR,KLOR-CON) 20 MEQ tablet Take 2 tablets (40 mEq total) by mouth daily. 09/19/13   Idalia Needle. Sanford, PA-C  predniSONE (DELTASONE) 50 MG tablet Take 1 tablet (50 mg total) by mouth daily. 09/19/13   Idalia Needle. Sanford, PA-C  Racepinephrine HCl 2.25 % NEBU nebulizer solution Take 0.5 mLs by nebulization 3 (three) times daily as needed (shortness of breath).    Historical Provider, MD   BP 158/94  Temp(Src) 98.1 F (36.7 C) (Oral)  Resp 22  SpO2 94%  Vital signs normal   Physical Exam  Nursing note and vitals reviewed. Constitutional: She is oriented to person, place, and time. She appears well-developed and well-nourished.  Non-toxic appearance. She does not appear ill. She appears distressed.  HENT:  Head: Normocephalic and atraumatic.  Right Ear: External ear normal.  Left Ear: External ear normal.  Nose: Nose normal. No mucosal edema or rhinorrhea.  Mouth/Throat: Oropharynx is clear and moist and mucous membranes are normal. No dental abscesses or uvula swelling.  Eyes: Conjunctivae and EOM are normal. Pupils are equal, round, and reactive to light.  Neck: Normal range of motion and full passive range of motion without pain. Neck supple.  Cardiovascular: Normal rate, regular rhythm and normal heart sounds.  Exam reveals no gallop and no friction rub.   No murmur heard. Pulmonary/Chest: Accessory muscle usage  present. Tachypnea noted. She is in respiratory distress. She has decreased breath sounds. She has no wheezes. She has no rhonchi. She has no rales. She exhibits no tenderness and no crepitus.  Patient extremely short of breath, she has difficulty answering questions, patient tried to stand up at bedside or lean over the railing.  Abdominal: Soft. Normal appearance and bowel sounds are normal. She exhibits no distension. There is no tenderness. There is no rebound and no guarding.  Musculoskeletal: Normal range of motion. She exhibits no edema and no tenderness.  Moves all extremities well.   Neurological: She is alert and oriented to person, place, and time. She has normal strength. No cranial nerve deficit.  Skin: Skin is warm and intact. No rash noted. She is diaphoretic. No erythema. No pallor.  Psychiatric: She has a normal mood and affect. Her speech is normal and behavior is normal. Her mood appears not anxious.    ED Course  Procedures (including critical care time)  Medications  albuterol (PROVENTIL,VENTOLIN) solution continuous neb (15 mg/hr Nebulization Not Given 04/15/14 1522)  ipratropium (ATROVENT) nebulizer solution 0.5 mg (not administered)  lidocaine (cardiac) 100 mg/49ml (XYLOCAINE) 20 MG/ML injection 2% (not administered)  succinylcholine (ANECTINE) 20 MG/ML injection (not administered)  etomidate (AMIDATE) 2 MG/ML injection (not administered)  etomidate (AMIDATE) injection (40 mg Intravenous Given 04/15/14 1323)  succinylcholine (ANECTINE) injection (100 mg Intravenous Given 04/15/14 1324)  diphenhydrAMINE (BENADRYL) injection (25 mg Intravenous Given 04/15/14 1323)  ipratropium (ATROVENT) 0.02 % nebulizer solution (not administered)  0.9 %  sodium chloride infusion (not administered)  diphenhydrAMINE (BENADRYL) injection 25 mg (not administered)  methylPREDNISolone sodium succinate (SOLU-MEDROL) 125 mg/2 mL injection 60 mg (not administered)  methylPREDNISolone sodium  succinate (SOLU-MEDROL) 125 mg/2 mL injection 125 mg (125 mg Intravenous Given 04/15/14 1323)  EPINEPHrine (EPI-PEN) injection 0.3 mg (0.3 mg Intramuscular Given 04/15/14 1319)  diphenhydrAMINE (BENADRYL) 50 MG/ML injection (50 mg  Given 04/15/14 1320)  rocuronium (ZEMURON) 50 MG/5ML injection (70 mg  Given 04/15/14 1338)  propofol (DIPRIVAN) 10 mg/ml infusion (  Rate/Dose Change 04/15/14 1519)  magnesium sulfate IVPB 2 g 50 mL (0 g Intravenous Stopped 04/15/14 1451)  nicardipine (CARDENE) 20mg  in 0.86% saline 239ml IV infusion (0.1 mg/ml) (0 mg/hr Intravenous Stopped 04/15/14 1451)  albuterol (PROVENTIL) (2.5 MG/3ML) 0.083% nebulizer solution 5 mg (5 mg Nebulization Given 04/15/14 1528)   When I went into the room we called for respiratory to do BiPAP and to do a continuous nebulizer. During the course of conversation it was a possibility that patient was having an allergic reaction to aspirin. She was given IV Solu-Medrol, Benadryl, and epinephrine IM. We attempted to change her from [place] oxygen to [facility] which she wasn't tolerating, even just holding it in front of her face. Patient continued to be in severe respiratory distress. Her heart rate was 160-180, her pulse ox was in the 50s. Patient was breathing over 40 times a minute. At this point I felt  patient needed intubated. We assisted her with 100% oxygen with BVM which she resisted. She was given etomidate and succinylcholine and intubated by me. A 7 ET tube was selected for possibility of laryngeal edema with possible acute allergic reaction to aspirin. Immediately after intubation patient's pulse ox improved to 96%, her heart rate improved to 130.  1340 respiratory therapy is having difficulty ventilating patient. She has been started on a propofol drip. She was given rocuronium IV to help respiratory ventilate her. However on the monitor her vital signs are still improved. Her heart rate is 120, her pulse ox is 94-95%.  Pt's BP remains elevated at  260/120, she was started on Cardene drip.   14:43 Dr Titus Mould, critical care will come see patient for admission to the ICU  14:50 Respiratory therapist reports patient has become much easier to ventilate. Will adjust her vent and now add PEEP.   Nurse reports her BP has dropped to 250 systolic, cardene drip stopped.   15:35 Nurses report patient needs more sedation, versed bolus ordered. Continues on propofol drip.   Patient's BUN and creatinine were normal in February. She was started on IV magnesium.  INTUBATION Performed by: Rolland Porter L  Required items: required blood products, implants, devices, and special equipment available Patient identity confirmed: provided demographic data and hospital-assigned identification number Time out: Immediately prior to procedure a "time out" was called to verify the correct patient, procedure, equipment, support staff and site/side marked as required.  Indications: severe respiratory distress  Intubation method: Glidescope Laryngoscopy   Preoxygenation: 100 %BVM  Sedatives: 20 mg IV Etomidate Paralytic: 100 mg Succinylcholine  Tube Size: 7.0 cuffed  Post-procedure assessment: chest rise and ETCO2 monitor Breath sounds: equal and absent over the epigastrium Tube secured with: ETT holder Chest x-ray interpreted by radiologist and me.  Chest x-ray findings:  endotracheal tube in appropriate position after positioning 1 cm from 23 cm at the lip to 22 cm at the lip  Patient tolerated the procedure well with no immediate complications.      Labs Review Results for orders placed during the hospital encounter of 04/15/14  CBC WITH DIFFERENTIAL      Result Value Ref Range   WBC 8.7  4.0 - 10.5 K/uL   RBC 5.00  3.87 - 5.11 MIL/uL   Hemoglobin 14.1  12.0 - 15.0 g/dL   HCT 40.8  36.0 - 46.0 %   MCV 81.6  78.0 - 100.0 fL   MCH 28.2  26.0 - 34.0 pg   MCHC 34.6  30.0 - 36.0 g/dL   RDW 12.5  11.5 - 15.5 %   Platelets 318  150 - 400 K/uL    Neutrophils Relative % 42 (*) 43 - 77 %   Neutro Abs 3.6  1.7 - 7.7 K/uL   Lymphocytes Relative 41  12 - 46 %   Lymphs Abs 3.6  0.7 - 4.0 K/uL   Monocytes Relative 7  3 - 12 %   Monocytes Absolute 0.6  0.1 - 1.0 K/uL   Eosinophils Relative 9 (*) 0 - 5 %   Eosinophils Absolute 0.8 (*) 0.0 - 0.7 K/uL   Basophils Relative 1  0 - 1 %   Basophils Absolute 0.1  0.0 - 0.1 K/uL  COMPREHENSIVE METABOLIC PANEL      Result Value Ref Range   Sodium 139  137 - 147 mEq/L   Potassium 2.9 (*) 3.7 - 5.3 mEq/L   Chloride 100  96 - 112 mEq/L  CO2 23  19 - 32 mEq/L   Glucose, Bld 280 (*) 70 - 99 mg/dL   BUN 6  6 - 23 mg/dL   Creatinine, Ser 0.88  0.50 - 1.10 mg/dL   Calcium 8.5  8.4 - 10.5 mg/dL   Total Protein 7.4  6.0 - 8.3 g/dL   Albumin 3.5  3.5 - 5.2 g/dL   AST 30  0 - 37 U/L   ALT 23  0 - 35 U/L   Alkaline Phosphatase 112  39 - 117 U/L   Total Bilirubin 1.1  0.3 - 1.2 mg/dL   GFR calc non Af Amer 73 (*) >90 mL/min   GFR calc Af Amer 84 (*) >90 mL/min   Anion gap 16 (*) 5 - 15  PRO B NATRIURETIC PEPTIDE      Result Value Ref Range   Pro B Natriuretic peptide (BNP) 26.9  0 - 125 pg/mL  TROPONIN I      Result Value Ref Range   Troponin I <0.30  <0.30 ng/mL  I-STAT ARTERIAL BLOOD GAS, ED      Result Value Ref Range   pH, Arterial 7.135 (*) 7.350 - 7.450   pCO2 arterial 88.4 (*) 35.0 - 45.0 mmHg   pO2, Arterial 204.0 (*) 80.0 - 100.0 mmHg   Bicarbonate 29.8 (*) 20.0 - 24.0 mEq/L   TCO2 32  0 - 100 mmol/L   O2 Saturation 99.0     Acid-base deficit 2.0  0.0 - 2.0 mmol/L   Collection site RADIAL, ALLEN'S TEST ACCEPTABLE     Sample type ARTERIAL     Comment NOTIFIED PHYSICIAN     Laboratory interpretation all normal except acute respiratory acidosis, hpypokalemia    Imaging Review Dg Chest Portable 1 View  04/15/2014   CLINICAL DATA:  Shortness of breath  EXAM: PORTABLE CHEST - 1 VIEW  COMPARISON:  09/19/2013  FINDINGS: Endotracheal tube is noted 12 mm above the carina. This could  be withdrawn 1-2 cm. The cardiac shadow is stable. A nasogastric catheter seen coiled within the stomach. The lungs are well-aerated without focal infiltrate or sizable effusion.  IMPRESSION: Endotracheal tube just above the carina. This could be withdrawn 1-2 cm.   Electronically Signed   By: Inez Catalina M.D.   On: 04/15/2014 13:49     EKG Interpretation   Date/Time:  Friday April 15 2014 12:49:50 EDT Ventricular Rate:  97 PR Interval:  143 QRS Duration: 76 QT Interval:  339 QTC Calculation: 431 R Axis:   63 Text Interpretation:  Sinus rhythm LAE, consider biatrial enlargement LVH  with secondary repolarization abnormality No significant change since last  tracing 19 Sep 2013 Confirmed by Cheryll Keisler  MD-I, Damika Harmon ([id number]) on 04/15/2014  1:54:50 PM      MDM   Final diagnoses:  Acute respiratory failure with hypercapnia     Plan admission   Rolland Porter, MD, FACEP  CRITICAL CARE Performed by: Rolland Porter L Total critical care time: 33 min Critical care time was exclusive of separately billable procedures and treating other patients. Critical care was necessary to treat or prevent imminent or life-threatening deterioration. Critical care was time spent personally by me on the following activities: development of treatment plan with patient and/or surrogate as well as nursing, discussions with consultants, evaluation of patient's response to treatment, examination of patient, obtaining history from patient or surrogate, ordering and performing treatments and interventions, ordering and review of laboratory studies, ordering and review of radiographic studies, pulse oximetry and  re-evaluation of patient's condition.   Janice Norrie, MD 04/15/14 [place], MD 04/15/14 1620

## 2014-04-15 NOTE — Progress Notes (Signed)
RT note-intubated for respiratory distress, assisted with ED MD. Tammie [name] confirmed and ETT pull back 1 cm and secured at 22cm. Prolonged  E time to help provent air trapping, patfient was very difficult to ventilate initially. Will repeat ABG in hour, vital signs are stable at this time, sp02 100%, fio2 decreased to 60%.

## 2014-04-15 NOTE — H&P (Signed)
PULMONARY / CRITICAL CARE MEDICINE   Name: Tammie [name] MRN: [id number] DOB: Dec 31, 1960    ADMISSION DATE:  04/15/2014  REFERRING MD :  EDP   CHIEF COMPLAINT:  Acute Respiratory Failure, Anaphylaxis   INITIAL PRESENTATION: 53 y/o [name] admitted to [facility] on 9/11 after being seen for cough, congestion and chest pain.  Medicated with ASA & NTG.  Pt developed increased SOB & Anaphylactic reaction requiring emergent intubation. PCCM called for admission.   STUDIES:  9/11 ECHO >>   SIGNIFICANT EVENTS: 9/11  Admit with cough, congestion, chest pain, anaphylaxis with ASA (was not listed as an allergy)   HISTORY OF PRESENT ILLNESS:  53 y/o [name] with PMH of HTN, Allergies, and Asthma who went to her PCP office (pt can not name) for cough, congestion and chest pain.  She was transferred to [facility] ER for evaluation of abnormal EKG by primary office.  Details of visit are unavailable at this time and no family available. ER notes reflect she was able to answer questions on arrival to department.  She reported an intermittent headache, cough with productive green / yellow sputum and congestion.  She was medicated by EMS with NTG and 324 mg of ASA.  Aspirin was not listed as an allergy in the system.  While in the ER, she developed progressive SOB to the point of tripoding & inability to speak full sentences.  Given hx of ASA consumption, anaphylactic reaction was a concern.  She decompensated to the point of requiring emergent intubation. PCCM called for admission.  PAST MEDICAL HISTORY :  Past Medical History  Diagnosis Date  . Hypertension   . Environmental allergies   . Asthma    Past Surgical History  Procedure Laterality Date  . Abdominal hysterectomy     Prior to Admission medications   Medication Sig Start Date End Date Taking? Authorizing Provider  albuterol (PROVENTIL HFA;VENTOLIN HFA) 108 (90 BASE) MCG/ACT inhaler Inhale 2 puffs into the lungs every 4 (four) hours as needed for wheezing  or shortness of breath. 09/19/13  Yes Joaquim Lai C. Sanford, PA-C  fluticasone (FLONASE) 50 MCG/ACT nasal spray Place 1 spray into both nostrils daily. 04/11/14  Yes Historical Provider, MD  budesonide-formoterol (SYMBICORT) 160-4.5 MCG/ACT inhaler Inhale 2 puffs into the lungs 2 (two) times daily. 06/05/12   Adlih Moreno-Coll, MD  potassium chloride SA (K-DUR,KLOR-CON) 20 MEQ tablet Take 2 tablets (40 mEq total) by mouth daily. 09/19/13   Idalia Needle. Sanford, PA-C  predniSONE (DELTASONE) 50 MG tablet Take 1 tablet (50 mg total) by mouth daily. 09/19/13   Idalia Needle. Sanford, PA-C  Racepinephrine HCl 2.25 % NEBU nebulizer solution Take 0.5 mLs by nebulization 3 (three) times daily as needed (shortness of breath).    Historical Provider, MD   Allergies  Allergen Reactions  . Ace Inhibitors     Angioedema  . Ibuprofen     Throat swelling  . Lipitor [Atorvastatin] Swelling    Face, feet swell  . Nsaids Swelling    swelling    FAMILY HISTORY:  No family history on file.  SOCIAL HISTORY:  reports that she has never smoked. She does not have any smokeless tobacco history on file. She reports that she does not drink alcohol or use illicit drugs.  REVIEW OF SYSTEMS:  Unable to complete.   SUBJECTIVE:  Vented, some autopeep  VITAL SIGNS: Temp:  [98.1 F (36.7 C)] 98.1 F (36.7 C) (09/11 1256) Pulse Rate:  [103-143] 106 (09/11 1450) Resp:  [18-56]  23 (09/11 1450) BP: (123-267)/(72-152) 138/85 mmHg (09/11 1450) SpO2:  [71 %-100 %] 100 % (09/11 1450) FiO2 (%):  [60 %-100 %] 60 % (09/11 1453)  HEMODYNAMICS:    VENTILATOR SETTINGS: Vent Mode:  [-] PRVC FiO2 (%):  [60 %-100 %] 60 % Set Rate:  [20 bmp-26 bmp] 26 bmp Vt Set:  [400 mL] 400 mL PEEP:  [5 cmH20] 5 cmH20  INTAKE / OUTPUT: No intake or output data in the 24 hours ending 04/15/14 1513  PHYSICAL EXAMINATION: General:  wdwn adult female in NAD on vent Neuro:  Sedate, intermittent twitching noted HEENT:  OETT, mm pink/moist, no  jvd Cardiovascular:  s1s2 rrr, no m/r/g Lungs:  resp's even/non-labored on vent, lungs bilaterally coarse / tight with scattered wheezing Abdomen:  Round/soft, bsx4 active  Musculoskeletal:  No acute deformities  Skin:  Warm/dry, few scattered lesions on LE's / scabs  LABS:  CBC  Recent Labs Lab 04/15/14 1359  WBC 8.7  HGB 14.1  HCT 40.8  PLT 318   Coag's No results found for this basename: APTT, INR,  in the last 168 hours  BMET  Recent Labs Lab 04/15/14 1359  NA 139  K 2.9*  CL 100  CO2 23  BUN 6  CREATININE 0.88  GLUCOSE 280*   Electrolytes  Recent Labs Lab 04/15/14 1359  CALCIUM 8.5   Sepsis Markers No results found for this basename: LATICACIDVEN, PROCALCITON, O2SATVEN,  in the last 168 hours  ABG  Recent Labs Lab 04/15/14 1355  PHART 7.135*  PCO2ART 88.4*  PO2ART 204.0*   Liver Enzymes  Recent Labs Lab 04/15/14 1359  AST 30  ALT 23  ALKPHOS 112  BILITOT 1.1  ALBUMIN 3.5   Cardiac Enzymes  Recent Labs Lab 04/15/14 1359  TROPONINI <0.30  PROBNP 26.9   Glucose No results found for this basename: GLUCAP,  in the last 168 hours  Imaging No results found.   ASSESSMENT / PLAN:  PULMONARY OETT 9/11 >> A: Acute Respiratory Failure CAP - concern for CAP with noted complaints to PCP Concern for Anaphylactic Reaction / Upper Airway Obstruction Asthma with exacerbation P:   Full vent support, 8cc/kg Scheduled BD's + PRN Solu-medrol, benadryl, pepcid Daily assessment for SBT if ph corrected in am and MV reduced Trend CXR Repeat ABG @ 1630 Assess D-Dimer, consider ddx of PE given chest pain hx , follow clinical progress prior to contrast CT See ID Hold flonase  CARDIOVASCULAR CVL A:  Chest Pain - rule out MI HTN P:  Assess troponin, ECHO, EKG Hold ACE given hx of Asthma and it is listed as an allergy PRN hydralazine for SBP >170 If troponin positive, will need Cardiology consult  F/u EKG in am   RENAL A:    Hypokalemia  P:   KCL 40 mEq BID x2 doses Trend BMP  GASTROINTESTINAL A:   Ventilator Associated Dysphagia  P:   Begin TF SUP:  Pepcid (anaphylaxis)  HEMATOLOGIC A:   No acute issues - normal CBC on admission  P:  Monitor CBC Sub q hep  INFECTIOUS A:   R/O CAP (right side greater left) P:   BCx2 9/11 >>  UA/UC 9/11 >> Sputum 9/11 >> HIV 9/11 >>   Abx: Rocephin, start date 9/11, day 1/x Azithro, start date 9/11, day 1/x   ENDOCRINE A:   Hyperglycemia  - initial likely a stress response, steroid administration  P:   SSI moderate scale while on steroids  NEUROLOGIC A:  Acute Encephalopathy - in setting of sedation + hypercarbic resp failure with anaphalyxis P:   RASS goal: -2 Propofol for sedation  PRN fentanyl  Serial neuro exams, monitor twitching.  Most likely in related to hypercarbia but will need to monitor for resolution.  Does not appear like seizures on exam.  If further activity after hypercarbia resolves, consider EEG.    TODAY'S SUMMARY: 53 y/o F with PMH of asthma admitted with possible CAP, suspected anaphylaxis after ASA administration and chest pain.  Intubated in ER, admit to ICU.  R/o MI.  Consider ddx of PE with chest pain if not improved with above treatments  Noe Gens, NP-C [facility] Pgr: [phone] or [phone]   I have personally obtained a history, examined the patient, evaluated laboratory and imaging results, formulated the assessment and plan and placed orders.  CRITICAL CARE: The patient is critically ill with multiple organ systems failure and requires high complexity decision making for assessment and support, frequent evaluation and titration of therapies, application of advanced monitoring technologies and extensive interpretation of multiple databases. Critical Care Time devoted to patient care services described in this note is 35 minutes.     04/15/2014, 3:13 PM Lavon Paganini. Titus Mould, MD, [place] Pgr:  [facility]

## 2014-04-15 NOTE — Progress Notes (Signed)
Patient admitted to 2MW from the ER at 1700. The patient arrived on a propofol drip at 50 mcg, she was restless and agitated upon arrival, therefore fentanyl drip was started at 50 mcg and titrated up for patient's RASS goal.  Per ER nurse no family was contacted. I located the patient's sister number in the chart and called her but there was no answer. I left a message for her to return my call.  At [place], I called the sister-Shirley Brassfield and spoke with her and told her that her sister-Jevaeh was in the hospital. She stated she would come in the morning because she does not have a ride tonight.  Swayze Kozuch, Lauralyn Primes, RN

## 2014-04-15 NOTE — Progress Notes (Signed)
ANTIBIOTIC CONSULT NOTE - INITIAL  Pharmacy Consult for Ceftraiaxone Azithromycin Indication: rule out pneumonia  Allergies  Allergen Reactions  . Aspirin Anaphylaxis and Swelling    Throat swelling   . Ibuprofen Anaphylaxis and Other (See Comments)    Throat swelling  . Nsaids Swelling  . Ace Inhibitors Other (See Comments)    Angioedema  . Lipitor [Atorvastatin] Swelling and Other (See Comments)    Face, feet swell    Patient Measurements:    Vital Signs: Temp: 98.1 F (36.7 C) (09/11 1256) Temp src: Oral (09/11 1256) BP: 138/85 mmHg (09/11 1450) Pulse Rate: 106 (09/11 1450) Intake/Output from previous day:   Intake/Output from this shift:    Labs:  Recent Labs  04/15/14 1359  WBC 8.7  HGB 14.1  PLT 318  CREATININE 0.88   The CrCl is unknown because both a height and weight (above a minimum accepted value) are required for this calculation. No results found for this basename: VANCOTROUGH, VANCOPEAK, VANCORANDOM, GENTTROUGH, GENTPEAK, GENTRANDOM, TOBRATROUGH, TOBRAPEAK, TOBRARND, AMIKACINPEAK, AMIKACINTROU, AMIKACIN,  in the last 72 hours   Microbiology: No results found for this or any previous visit (from the past 720 hour(s)).  Medical History: Past Medical History  Diagnosis Date  . Hypertension   . Environmental allergies   . Asthma     Medications:   (Not in a hospital admission) Assessment: 53 yo M admitted 04/15/2014 with acute respiratory failure due to anaphylactic reaction to aspirin.  Pharmacy consulted to dose azithromycin and ceftriaxone for a presumed CAP.  PMH:  HTN, Asthma, Significant allergies> ACEI antigoedema, ASA/NSAID anaphylaxsis, Lipitor face swelling.  ID: CAP, WBC,  9/11 Azith Ceftriaxone  Goal of Therapy:  Renal adjustment of antibiotics and follow with CCM  Plan:  1. Continue ceftriaxone and azithromycin as ordered by CCM, no adjustment indicated. 2. Follow up SCr, UOP, cultures, clinical course and adjust as  clinically indicated.  Thank you for allowing pharmacy to be a part of this patients care team.  Rowe Robert Pharm.D., BCPS, AQ-Cardiology Clinical Pharmacist 04/15/2014 4:19 PM Pager: [phone] Phone: [phone]

## 2014-04-15 NOTE — ED Notes (Signed)
Pt noted to have increased SOB, pt unable to speak complete sentences and pt standing. MD aware and at bedside.

## 2014-04-15 NOTE — ED Notes (Signed)
Dr. Tomi Bamberger at bedside requesting glidoscope for intubation. Epi-pen administered to outer left thigh at 1319. Benadryl 25 mg iv given at 1320.HR--117 R=28 Sat 34% RESP has been called and on way pt being assisted with ventilations with bag/valve ambu.HR=117 sAT 44%. Solumedrol 125 mg iv given at 1323. Etomidate 40 mg iv given at 1323. Succinocholine 100 mg iv given at 1324. 7.0 ETT inserted by glidoscope positive by color change CO2 detector and positive by auscultation. Secured at lip 23 cm. HR 138/MIN AND SAT 95%

## 2014-04-16 ENCOUNTER — Inpatient Hospital Stay (HOSPITAL_COMMUNITY): Payer: No Typology Code available for payment source

## 2014-04-16 ENCOUNTER — Encounter (HOSPITAL_COMMUNITY): Payer: Self-pay | Admitting: Radiology

## 2014-04-16 DIAGNOSIS — I359 Nonrheumatic aortic valve disorder, unspecified: Secondary | ICD-10-CM

## 2014-04-16 DIAGNOSIS — R072 Precordial pain: Secondary | ICD-10-CM

## 2014-04-16 LAB — GLUCOSE, CAPILLARY
GLUCOSE-CAPILLARY: 158 mg/dL — AB (ref 70–99)
Glucose-Capillary: 138 mg/dL — ABNORMAL HIGH (ref 70–99)
Glucose-Capillary: 181 mg/dL — ABNORMAL HIGH (ref 70–99)
Glucose-Capillary: 174 mg/dL — ABNORMAL HIGH (ref 70–99)
Glucose-Capillary: 161 mg/dL — ABNORMAL HIGH (ref 70–99)
Glucose-Capillary: 143 mg/dL — ABNORMAL HIGH (ref 70–99)

## 2014-04-16 LAB — BLOOD GAS, ARTERIAL
ACID-BASE DEFICIT: 1.4 mmol/L (ref 0.0–2.0)
BICARBONATE: 22.2 meq/L (ref 20.0–24.0)
DRAWN BY: 41308
FIO2: 40 %
LHR: 20 {breaths}/min
MECHVT: 400 mL
O2 SAT: 99.1 %
PCO2 ART: 33.4 mmHg — AB (ref 35.0–45.0)
PEEP: 5 cmH2O
Patient temperature: 98.6
TCO2: 23.2 mmol/L (ref 0–100)
pH, Arterial: 7.437 (ref 7.350–7.450)
pO2, Arterial: 124 mmHg — ABNORMAL HIGH (ref 80.0–100.0)

## 2014-04-16 LAB — CBC
HCT: 34.4 % — ABNORMAL LOW (ref 36.0–46.0)
Hemoglobin: 12.1 g/dL (ref 12.0–15.0)
MCH: 27.9 pg (ref 26.0–34.0)
MCHC: 35.2 g/dL (ref 30.0–36.0)
MCV: 79.3 fL (ref 78.0–100.0)
PLATELETS: 277 10*3/uL (ref 150–400)
RBC: 4.34 MIL/uL (ref 3.87–5.11)
RDW: 12.5 % (ref 11.5–15.5)
WBC: 8.8 10*3/uL (ref 4.0–10.5)

## 2014-04-16 LAB — BASIC METABOLIC PANEL
Anion gap: 12 (ref 5–15)
BUN: 13 mg/dL (ref 6–23)
CO2: 22 mEq/L (ref 19–32)
CREATININE: 1.06 mg/dL (ref 0.50–1.10)
Calcium: 8.3 mg/dL — ABNORMAL LOW (ref 8.4–10.5)
Chloride: 108 mEq/L (ref 96–112)
GFR calc non Af Amer: 58 mL/min — ABNORMAL LOW (ref >90)
GFR, EST AFRICAN AMERICAN: 67 mL/min — AB (ref >90)
Glucose, Bld: 155 mg/dL — ABNORMAL HIGH (ref 70–99)
POTASSIUM: 2.9 meq/L — AB (ref 3.7–5.3)
SODIUM: 142 meq/L (ref 137–147)

## 2014-04-16 LAB — PROCALCITONIN: PROCALCITONIN: 1.56 ng/mL

## 2014-04-16 LAB — TROPONIN I: Troponin I: <0.3 ng/mL (ref <0.30)

## 2014-04-16 LAB — PHOSPHORUS
PHOSPHORUS: 1.3 mg/dL — AB (ref 2.3–4.6)
PHOSPHORUS: 2.4 mg/dL (ref 2.3–4.6)
Phosphorus: 3.6 mg/dL (ref 2.3–4.6)

## 2014-04-16 LAB — HIV ANTIBODY (ROUTINE TESTING W REFLEX): HIV 1&2 Ab, 4th Generation: NONREACTIVE

## 2014-04-16 LAB — MAGNESIUM
MAGNESIUM: 2.2 mg/dL (ref 1.5–2.5)
Magnesium: 2.2 mg/dL (ref 1.5–2.5)
Magnesium: 2.5 mg/dL (ref 1.5–2.5)

## 2014-04-16 MED ORDER — PROPOFOL 10 MG/ML IV EMUL
5.0000 ug/kg/min | INTRAVENOUS | Status: DC
  Administered 2014-04-16 – 2014-04-17 (×2): 40 ug/kg/min via INTRAVENOUS
  Administered 2014-04-17: 50 ug/kg/min via INTRAVENOUS
  Filled 2014-04-16 (×4): qty 100

## 2014-04-16 MED ORDER — VITAL HIGH PROTEIN PO LIQD
1000.0000 mL | ORAL | Status: DC
  Administered 2014-04-17: 1000 mL
  Filled 2014-04-16 (×4): qty 1000

## 2014-04-16 MED ORDER — PRO-STAT SUGAR FREE PO LIQD
30.0000 mL | Freq: Every day | ORAL | Status: DC
  Administered 2014-04-16 – 2014-04-17 (×2): 30 mL
  Filled 2014-04-16 (×3): qty 30

## 2014-04-16 MED ORDER — POTASSIUM PHOSPHATES 15 MMOLE/5ML IV SOLN
24.0000 mmol | Freq: Once | INTRAVENOUS | Status: AC
  Administered 2014-04-16: 24 mmol via INTRAVENOUS
  Filled 2014-04-16: qty 8

## 2014-04-16 MED ORDER — IOHEXOL 350 MG/ML SOLN
80.0000 mL | Freq: Once | INTRAVENOUS | Status: AC | PRN
  Administered 2014-04-16: 80 mL via INTRAVENOUS

## 2014-04-16 MED ORDER — PNEUMOCOCCAL VAC POLYVALENT 25 MCG/0.5ML IJ INJ
0.5000 mL | INJECTION | INTRAMUSCULAR | Status: DC
  Filled 2014-04-16: qty 0.5

## 2014-04-16 MED ORDER — INFLUENZA VAC SPLIT QUAD 0.5 ML IM SUSY
0.5000 mL | PREFILLED_SYRINGE | INTRAMUSCULAR | Status: DC
  Filled 2014-04-16: qty 0.5

## 2014-04-16 MED ORDER — POTASSIUM CHLORIDE 20 MEQ/15ML (10%) PO LIQD
40.0000 meq | Freq: Once | ORAL | Status: AC
  Administered 2014-04-16: 40 meq
  Filled 2014-04-16: qty 30

## 2014-04-16 NOTE — Progress Notes (Signed)
  Echocardiogram 2D Echocardiogram has been performed.  Tammie [name] 04/16/2014, 11:49 AM

## 2014-04-16 NOTE — Progress Notes (Addendum)
[facility] Physician Progress Note and Electrolyte Replacement  Patient Name: Tammie [name] DOB: March 14, 1961 MRN: [id number]  Date of Service  04/16/2014   HPI/Events of Note    Recent Labs Lab 04/15/14 1359 04/16/14 0342  NA 139 142  K 2.9* 2.9*  CL 100 108  CO2 23 22  GLUCOSE 280* 155*  BUN 6 13  CREATININE 0.88 1.06  CALCIUM 8.5 8.3*  MG  --  2.2  PHOS  --  1.3*    Estimated Creatinine Clearance: 52.3 ml/min (by C-G formula based on Cr of 1.06).  Intake/Output     09/11 0701 - 09/12 0700   I.V. (mL/kg) 887.8 (14.1)   NG/GT 235   IV Piggyback 350   Total Intake(mL/kg) 1472.8 (23.4)   Urine (mL/kg/hr) 1055   Total Output 1055   Net +417.8        - I/O DETAILED x 24h    Total I/O In: 1231.2 [I.V.:696.2; NG/GT:235; IV Piggyback:300] Out: 580 [Urine:580] - I/O THIS SHIFT    ASSESSMENT Low K Low Phos No central line  eICURN Interventions  PO KCL x 2 x 101meq K phos   ASSESSMENT: MAJOR ELECTROLYTE      Dr. Brand Males, M.D., [facility].C.P Pulmonary and Critical Care Medicine Staff Physician [place] [facility] Pager: [phone], If no answer or between  15:00h - 7:00h: call [phone]  04/16/2014 4:54 AM

## 2014-04-16 NOTE — Progress Notes (Signed)
INITIAL NUTRITION ASSESSMENT  DOCUMENTATION CODES Per approved criteria  -Not Applicable   INTERVENTION: Utilize 52M PEPuP Protocol: initiate TF via OGT with Vital HP formula at 25 ml/h and Prostat 30 ml daily on day 1; on day 2, increase to goal rate of 40 ml/h (960 ml per day) to provide 1060 kcals, 99 gm protein, 803 ml free water daily.  Rest of estimated kcal needs to be met with current Propofol infusion. RD to follow for nutrition care plan  NUTRITION DIAGNOSIS: Inadequate oral intake related to inability to eat as evidenced by NPO status  Goal: Pt to meet >/= 90% of their estimated nutrition needs   Monitor:  TF regimen & tolerance, respiratory status, weight, labs, I/O's  Reason for Assessment: Consult  53 y.o. female  Admitting Dx:   ASSESSMENT: 53 y/o [name] admitted to [facility] on 9/11 after being seen for cough, congestion and chest pain. Medicated with ASA & NTG. Pt developed increased SOB & Anaphylactic reaction requiring emergent intubation. PCCM called for admission.  Patient is currently intubated on ventilator support -- OGT in place MV: 8.2 L/min Temp (24hrs), Avg:98.8 F (37.1 C), Min:98.1 F (36.7 C), Max:99.2 F (37.3 C)   Propofol: 17.7 ml/hr ----> 467 fat kcals   No nutrition problems identified PT.  No muscle or subcutaneous fat depletion noticed.  RD consulted for TF initiation & management.  Height: Ht Readings from Last 1 Encounters:  04/15/14 _0  (1.575 m)    Weight: Wt Readings from Last 1 Encounters:  04/16/14 140 lb 14 oz (63.9 kg)    Ideal Body Weight: 110 lb  % Ideal Body Weight: 127%  Wt Readings from Last 10 Encounters:  04/16/14 140 lb 14 oz (63.9 kg)  09/19/13 130 lb (58.968 kg)    Usual Body Weight: 130 lb  % Usual Body Weight: 107%  BMI:  Body mass index is 25.76 kg/(m^2).  Estimated Nutritional Needs: Kcal: 4680-3212 Protein: 95-105 gm Fluid: per MD  Skin: Intact  Diet Order: NPO  EDUCATION NEEDS: -No  education needs identified at this time   Intake/Output Summary (Last 24 hours) at 04/16/14 0859 Last data filed at 04/16/14 0800  Gross per 24 hour  Intake 2483.7 ml  Output   1475 ml  Net 1008.7 ml    Labs:   Recent Labs Lab 04/15/14 1359 04/16/14 0342  NA 139 142  K 2.9* 2.9*  CL 100 108  CO2 23 22  BUN 6 13  CREATININE 0.88 1.06  CALCIUM 8.5 8.3*  MG  --  2.2  PHOS  --  1.3*  GLUCOSE 280* 155*    CBG (last 3)   Recent Labs  04/15/14 2329 04/16/14 0455 04/16/14 0707  GLUCAP 138* 181* 174*    Scheduled Meds: . albuterol  15 mg/hr Nebulization Once  . antiseptic oral rinse  7 mL Mouth Rinse QID  . azithromycin  500 mg Intravenous Q24H  . cefTRIAXone (ROCEPHIN)  IV  1 g Intravenous Q24H  . chlorhexidine  15 mL Mouth Rinse BID  . diphenhydrAMINE  25 mg Intravenous 3 times per day  . famotidine (PEPCID) IV  20 mg Intravenous Q12H  . feeding supplement (VITAL HIGH PROTEIN)  1,000 mL Per Tube Q24H  . heparin  5,000 Units Subcutaneous 3 times per day  . insulin aspart  0-15 Units Subcutaneous 6 times per day  . ipratropium-albuterol  3 mL Nebulization Q6H  . methylPREDNISolone (SOLU-MEDROL) injection  60 mg Intravenous 3 times per day  .  potassium chloride  40 mEq Per Tube BID  . potassium phosphate IVPB (mmol)  24 mmol Intravenous Once    Continuous Infusions: . sodium chloride    . sodium chloride 75 mL/hr at 04/15/14 2000  . fentaNYL infusion INTRAVENOUS 150 mcg/hr (04/16/14 0634)  . propofol 50 mcg/kg/min (04/16/14 3005)    Past Medical History  Diagnosis Date  . Hypertension   . Environmental allergies   . Asthma     Past Surgical History  Procedure Laterality Date  . Abdominal hysterectomy      Arthur Holms, RD, LDN Pager #: [phone] After-Hours Pager #: [phone]

## 2014-04-16 NOTE — Progress Notes (Signed)
Rate changed to 14 per MD order.  Pt transferred to/from CT w/ no apparent complications.

## 2014-04-16 NOTE — Progress Notes (Signed)
Tracheal aspirate collected and sent to lab w/ label, and requisition.

## 2014-04-16 NOTE — Progress Notes (Signed)
CRITICAL VALUE ALERT  Critical value received:  K 2.9  Date of notification:  04/16/2014  Time of notification:  04:30  Critical value read back:Yes.    Nurse who received alert:  BShepherd, RN  MD notified (1st page):  Ramaswamy  Time of first page:  04:52  MD notified (2nd page):  Time of second page:  Responding MD:  Chase Caller  Time MD responded:  04:53

## 2014-04-16 NOTE — H&P (Signed)
PULMONARY / CRITICAL CARE MEDICINE   Name: Tammie [name] MRN: [id number] DOB: 1961/03/04    ADMISSION DATE:  04/15/2014  REFERRING MD :  EDP   CHIEF COMPLAINT:  Acute Respiratory Failure, Anaphylaxis   INITIAL PRESENTATION: 53 y/o [name] admitted to [facility] on 9/11 after being seen for cough, congestion and chest pain.  Medicated with ASA & NTG.  Pt developed increased SOB & Anaphylactic reaction requiring emergent intubation. PCCM called for admission.   STUDIES:  9/11 ECHO >>   SIGNIFICANT EVENTS: 9/11  Admit with cough, congestion, chest pain, anaphylaxis with ASA (was not listed as an allergy) 9/12 trop neg  SUBJECTIVE:  Vented, some autopeep  VITAL SIGNS: Temp:  [98.1 F (36.7 C)-99.2 F (37.3 C)] 99.1 F (37.3 C) (09/12 0747) Pulse Rate:  [58-143] 60 (09/12 1000) Resp:  [18-56] 20 (09/12 1000) BP: (88-267)/(50-152) 100/56 mmHg (09/12 1000) SpO2:  [71 %-100 %] 98 % (09/12 1000) FiO2 (%):  [40 %-100 %] 40 % (09/12 0800) Weight:  [62.9 kg (138 lb 10.7 oz)-63.9 kg (140 lb 14 oz)] 63.9 kg (140 lb 14 oz) (09/12 0600)  HEMODYNAMICS:    VENTILATOR SETTINGS: Vent Mode:  [-] PRVC FiO2 (%):  [40 %-100 %] 40 % Set Rate:  [20 bmp-26 bmp] 20 bmp Vt Set:  [400 mL] 400 mL PEEP:  [5 cmH20] 5 cmH20 Plateau Pressure:  [20 cmH20-22 cmH20] 22 cmH20  INTAKE / OUTPUT:  Intake/Output Summary (Last 24 hours) at 04/16/14 1119 Last data filed at 04/16/14 1038  Gross per 24 hour  Intake 2969.1 ml  Output   1600 ml  Net 1369.1 ml    PHYSICAL EXAMINATION: General:  wdwn adult female in NAD on vent Neuro:  Sedate, rass -2 HEENT:  OETT, up jvd Cardiovascular:  s1s2 rrr, no m/r/g Lungs:  Coarse throughout Abdomen:  Round/soft, bsx4 active  Musculoskeletal:  No acute deformities  Skin:  Warm/dry, few scattered lesions on LE's / scabs  LABS:  CBC  Recent Labs Lab 04/15/14 1359 04/16/14 0342  WBC 8.7 8.8  HGB 14.1 12.1  HCT 40.8 34.4*  PLT 318 277   Coag's No results found for  this basename: APTT, INR,  in the last 168 hours  BMET  Recent Labs Lab 04/15/14 1359 04/16/14 0342  NA 139 142  K 2.9* 2.9*  CL 100 108  CO2 23 22  BUN 6 13  CREATININE 0.88 1.06  GLUCOSE 280* 155*   Electrolytes  Recent Labs Lab 04/15/14 1359 04/16/14 0342 04/16/14 0908  CALCIUM 8.5 8.3*  --   MG  --  2.2 2.2  PHOS  --  1.3* 2.4   Sepsis Markers No results found for this basename: LATICACIDVEN, PROCALCITON, O2SATVEN,  in the last 168 hours  ABG  Recent Labs Lab 04/15/14 1355 04/15/14 1607 04/16/14 0405  PHART 7.135* 7.426 7.437  PCO2ART 88.4* 42.3 33.4*  PO2ART 204.0* 246.0* 124.0*   Liver Enzymes  Recent Labs Lab 04/15/14 1359  AST 30  ALT 23  ALKPHOS 112  BILITOT 1.1  ALBUMIN 3.5   Cardiac Enzymes  Recent Labs Lab 04/15/14 1359 04/15/14 2000 04/16/14 0338  TROPONINI <0.30 <0.30 <0.30  PROBNP 26.9  --   --    Glucose  Recent Labs Lab 04/15/14 1748 04/15/14 2113 04/15/14 2329 04/16/14 0455 04/16/14 0707  GLUCAP 134* 205* 138* 181* 174*    Imaging Dg Chest Portable 1 View  04/15/2014   CLINICAL DATA:  Shortness of breath  EXAM: PORTABLE CHEST -  1 VIEW  COMPARISON:  09/19/2013  FINDINGS: Endotracheal tube is noted 12 mm above the carina. This could be withdrawn 1-2 cm. The cardiac shadow is stable. A nasogastric catheter seen coiled within the stomach. The lungs are well-aerated without focal infiltrate or sizable effusion.  IMPRESSION: Endotracheal tube just above the carina. This could be withdrawn 1-2 cm.   Electronically Signed   By: Inez Catalina M.D.   On: 04/15/2014 13:49   Dg Abd Portable 1v  04/15/2014   CLINICAL DATA:  Orogastric tube placement.  EXAM: PORTABLE ABDOMEN - 1 VIEW  COMPARISON:  None.  FINDINGS: Gaseous distention of the stomach is present. There is an enteric tube with the redundant loop near the cardia of the stomach. The tip of the tube is in the mid gastric fundus.  IMPRESSION: Enteric tube tip in the mid gastric  fundus.   Electronically Signed   By: Dereck Ligas M.D.   On: 04/15/2014 19:36     ASSESSMENT / PLAN:  PULMONARY OETT 9/11 >> A: Acute Respiratory Failure CAP - concern for CAP with noted complaints to PCP Concern for Anaphylactic Reaction / Upper Airway Obstruction Asthma with exacerbation P:   ABG this am reviewed, reduce mV slight Scheduled BD's + PRN Solu-medrol, benadryl, pepcid - maintain  Rate reduction Wean cpap 5 ps 5, goal 30 min  Will need leak testing Consider lasix, will follow crt further Correct phos Ct chest as trop neg, etiology not clear  CARDIOVASCULAR CVL A:  No evidence ischemia HTN P:  Assess troponin, ECHO Hold ACE given hx of Asthma and it is listed as an allergy, and rise crt PRN hydralazine for SBP >150 Ct chest to r/o PE  RENAL A:   Hypokalemia , hypophos P:   KCL sup Phos supp Trend BMP Repeat testing this am  May need line and cvp Consider reduction volume  GASTROINTESTINAL A:   Ventilator Associated Dysphagia  P:   Continue TF SUP:  Pepcid (anaphylaxis)  HEMATOLOGIC A:   No acute issues - normal CBC on admission  P:  Monitor CBC Sub q hep  INFECTIOUS A:   R/O CAP (right side greater left) P:   BCx2 9/11 >>  UA/UC 9/11 >> Sputum 9/11 >> HIV 9/11 >>   Abx: Rocephin, start date 9/11>>> Azithro, start date 9/11>>>  Pct algo to liit abx Ct chest  ENDOCRINE A:   Hyperglycemia  - initial likely a stress response, steroid administration  P:   SSI moderate scale while on steroids  NEUROLOGIC A:   Acute Encephalopathy - in setting of sedation + hypercarbic resp failure with anaphalyxis P:   RASS goal: -2 Propofol for sedation with wua PRN fentanyl   TODAY'S SUMMARY: etiology unknown, ehco, CT chest, lasix soon  I have personally obtained a history, examined the patient, evaluated laboratory and imaging results, formulated the assessment and plan and placed orders.  CRITICAL CARE: The patient is  critically ill with multiple organ systems failure and requires high complexity decision making for assessment and support, frequent evaluation and titration of therapies, application of advanced monitoring technologies and extensive interpretation of multiple databases. Critical Care Time devoted to patient care services described in this note is 30 minutes.    Lavon Paganini. Titus Mould, MD, [place] Pgr: [place] [facility]  04/16/2014, 11:19 AM Lavon Paganini. Titus Mould, MD, [place] Pgr: [place] [facility]

## 2014-04-17 ENCOUNTER — Inpatient Hospital Stay (HOSPITAL_COMMUNITY): Payer: No Typology Code available for payment source

## 2014-04-17 DIAGNOSIS — T788XXS Other adverse effects, not elsewhere classified, sequela: Secondary | ICD-10-CM

## 2014-04-17 DIAGNOSIS — T7589XS Other specified effects of external causes, sequela: Secondary | ICD-10-CM

## 2014-04-17 LAB — CBC WITH DIFFERENTIAL/PLATELET
Basophils Absolute: 0 10*3/uL (ref 0.0–0.1)
Basophils Relative: 0 % (ref 0–1)
EOS PCT: 0 % (ref 0–5)
Eosinophils Absolute: 0 10*3/uL (ref 0.0–0.7)
HCT: 36.7 % (ref 36.0–46.0)
HEMOGLOBIN: 12.2 g/dL (ref 12.0–15.0)
LYMPHS ABS: 0.4 10*3/uL — AB (ref 0.7–4.0)
Lymphocytes Relative: 3 % — ABNORMAL LOW (ref 12–46)
MCH: 27.6 pg (ref 26.0–34.0)
MCHC: 33.2 g/dL (ref 30.0–36.0)
MCV: 83 fL (ref 78.0–100.0)
Monocytes Absolute: 0.7 10*3/uL (ref 0.1–1.0)
Monocytes Relative: 5 % (ref 3–12)
Neutro Abs: 12.4 10*3/uL — ABNORMAL HIGH (ref 1.7–7.7)
Neutrophils Relative %: 92 % — ABNORMAL HIGH (ref 43–77)
Platelets: 287 10*3/uL (ref 150–400)
RBC: 4.42 MIL/uL (ref 3.87–5.11)
RDW: 13.5 % (ref 11.5–15.5)
WBC: 13.5 10*3/uL — ABNORMAL HIGH (ref 4.0–10.5)

## 2014-04-17 LAB — COMPREHENSIVE METABOLIC PANEL
ALBUMIN: 2.9 g/dL — AB (ref 3.5–5.2)
ALT: 16 U/L (ref 0–35)
AST: 21 U/L (ref 0–37)
Alkaline Phosphatase: 72 U/L (ref 39–117)
Anion gap: 10 (ref 5–15)
BUN: 20 mg/dL (ref 6–23)
CO2: 21 mEq/L (ref 19–32)
CREATININE: 0.96 mg/dL (ref 0.50–1.10)
Calcium: 8.5 mg/dL (ref 8.4–10.5)
Chloride: 111 mEq/L (ref 96–112)
GFR calc Af Amer: 76 mL/min — ABNORMAL LOW (ref >90)
GFR calc non Af Amer: 65 mL/min — ABNORMAL LOW (ref >90)
Glucose, Bld: 157 mg/dL — ABNORMAL HIGH (ref 70–99)
Potassium: 5.1 mEq/L (ref 3.7–5.3)
Sodium: 142 mEq/L (ref 137–147)
TOTAL PROTEIN: 6.4 g/dL (ref 6.0–8.3)

## 2014-04-17 LAB — GLUCOSE, CAPILLARY
GLUCOSE-CAPILLARY: 132 mg/dL — AB (ref 70–99)
GLUCOSE-CAPILLARY: 144 mg/dL — AB (ref 70–99)
Glucose-Capillary: 147 mg/dL — ABNORMAL HIGH (ref 70–99)
Glucose-Capillary: 133 mg/dL — ABNORMAL HIGH (ref 70–99)
Glucose-Capillary: 143 mg/dL — ABNORMAL HIGH (ref 70–99)
Glucose-Capillary: 152 mg/dL — ABNORMAL HIGH (ref 70–99)

## 2014-04-17 LAB — PHOSPHORUS
Phosphorus: 3.7 mg/dL (ref 2.3–4.6)
Phosphorus: 3 mg/dL (ref 2.3–4.6)

## 2014-04-17 LAB — PROCALCITONIN: Procalcitonin: 1.04 ng/mL

## 2014-04-17 LAB — MAGNESIUM
MAGNESIUM: 2.3 mg/dL (ref 1.5–2.5)
MAGNESIUM: 2.4 mg/dL (ref 1.5–2.5)

## 2014-04-17 MED ORDER — METHYLPREDNISOLONE SODIUM SUCC 125 MG IJ SOLR
80.0000 mg | Freq: Three times a day (TID) | INTRAMUSCULAR | Status: DC
  Administered 2014-04-17 – 2014-04-18 (×3): 80 mg via INTRAVENOUS
  Filled 2014-04-17 (×4): qty 1.28
  Filled 2014-04-17: qty 2
  Filled 2014-04-17 (×2): qty 1.28

## 2014-04-17 MED ORDER — FUROSEMIDE 10 MG/ML IJ SOLN: 40.0000 mg | Freq: Two times a day (BID) | INTRAMUSCULAR | Status: DC

## 2014-04-17 MED ORDER — PNEUMOCOCCAL VAC POLYVALENT 25 MCG/0.5ML IJ INJ: 0.5000 mL | INJECTION | INTRAMUSCULAR | Status: DC | PRN

## 2014-04-17 MED ORDER — INFLUENZA VAC SPLIT QUAD 0.5 ML IM SUSY: 0.5000 mL | PREFILLED_SYRINGE | INTRAMUSCULAR | Status: DC | PRN

## 2014-04-17 NOTE — Progress Notes (Signed)
PULMONARY / CRITICAL CARE MEDICINE   Name: Tammie [name] MRN: [id number] DOB: 11-Mar-1961    ADMISSION DATE:  04/15/2014  REFERRING MD :  EDP   CHIEF COMPLAINT:  Acute Respiratory Failure, Anaphylaxis   INITIAL PRESENTATION: 53 y/o [name] admitted to [facility] on 9/11 after being seen for cough, congestion and chest pain.  Medicated with ASA & NTG.  Pt developed increased SOB & Anaphylactic reaction requiring emergent intubation. PCCM called for admission.   STUDIES:  9/11 ECHO >>diast 1, EF 65%-70%, some lvh 9/12 CT>>>no PE  SIGNIFICANT EVENTS: 9/11  Admit with cough, congestion, chest pain, HTN , anaphylaxis with ASA (was not listed as an allergy) 9/12 trop neg  SUBJECTIVE:  Remained vented  VITAL SIGNS: Temp:  [97.5 F (36.4 C)-99.1 F (37.3 C)] 98 F (36.7 C) (09/13 0748) Pulse Rate:  [55-88] 76 (09/13 0845) Resp:  [14-20] 16 (09/13 0845) BP: (90-126)/(49-69) 126/64 mmHg (09/13 0700) SpO2:  [98 %-100 %] 99 % (09/13 0845) FiO2 (%):  [40 %] 40 % (09/13 0845) Weight:  [64.8 kg (142 lb 13.7 oz)] 64.8 kg (142 lb 13.7 oz) (09/13 0500)  HEMODYNAMICS:    VENTILATOR SETTINGS: Vent Mode:  [-] CPAP;PSV FiO2 (%):  [40 %] 40 % Set Rate:  [14 bmp] 14 bmp Vt Set:  [400 mL] 400 mL PEEP:  [5 cmH20] 5 cmH20 Pressure Support:  [10 cmH20] 10 cmH20 Plateau Pressure:  [19 cmH20-23 cmH20] 20 cmH20  INTAKE / OUTPUT:  Intake/Output Summary (Last 24 hours) at 04/17/14 0926 Last data filed at 04/17/14 0900  Gross per 24 hour  Intake 2544.23 ml  Output   1655 ml  Net 889.23 ml    PHYSICAL EXAMINATION: General:  wdwn adult female in NAD on vent Neuro:  Sedate, rass 1 HEENT:  OETT, jvd Cardiovascular:  s1s2 rrr, no m/r/g Lungs:  Coarse, wheezing bilateral moderate exp Abdomen:  Round/soft, bsx4 active  Musculoskeletal:  No acute deformities  Skin:  Warm/dry, few scattered lesions on LE's / scabs  LABS:  CBC  Recent Labs Lab 04/15/14 1359 04/16/14 0342 04/17/14 0343  WBC 8.7 8.8  13.5*  HGB 14.1 12.1 12.2  HCT 40.8 34.4* 36.7  PLT 318 277 287   Coag's No results found for this basename: APTT, INR,  in the last 168 hours  BMET  Recent Labs Lab 04/15/14 1359 04/16/14 0342 04/17/14 0343  NA 139 142 142  K 2.9* 2.9* 5.1  CL 100 108 111  CO2 23 22 21   BUN 6 13 20   CREATININE 0.88 1.06 0.96  GLUCOSE 280* 155* 157*   Electrolytes  Recent Labs Lab 04/15/14 1359 04/16/14 0342 04/16/14 0908 04/16/14 2120 04/17/14 0343  CALCIUM 8.5 8.3*  --   --  8.5  MG  --  2.2 2.2 2.5  --   PHOS  --  1.3* 2.4 3.6  --    Sepsis Markers  Recent Labs Lab 04/16/14 0908 04/17/14 0343  PROCALCITON 1.56 1.04    ABG  Recent Labs Lab 04/15/14 1355 04/15/14 1607 04/16/14 0405  PHART 7.135* 7.426 7.437  PCO2ART 88.4* 42.3 33.4*  PO2ART 204.0* 246.0* 124.0*   Liver Enzymes  Recent Labs Lab 04/15/14 1359 04/17/14 0343  AST 30 21  ALT 23 16  ALKPHOS 112 72  BILITOT 1.1 <0.2*  ALBUMIN 3.5 2.9*   Cardiac Enzymes  Recent Labs Lab 04/15/14 1359 04/15/14 2000 04/16/14 0338  TROPONINI <0.30 <0.30 <0.30  PROBNP 26.9  --   --  Glucose  Recent Labs Lab 04/16/14 1118 04/16/14 1503 04/16/14 1929 04/17/14 0028 04/17/14 0434 04/17/14 0700  GLUCAP 161* 158* 143* 144* 147* 133*    Imaging Ct Angio Chest Pe W/cm &/or Wo Cm  04/16/2014   CLINICAL DATA:  Cough, congestion, shortness of breath. Patient on ventilator. Possible anaphylaxis.  EXAM: CT ANGIOGRAPHY CHEST WITH CONTRAST  TECHNIQUE: Multidetector CT imaging of the chest was performed using the standard protocol during bolus administration of intravenous contrast. Multiplanar CT image reconstructions and MIPs were obtained to evaluate the vascular anatomy.  CONTRAST:  76mL OMNIPAQUE IOHEXOL 350 MG/ML SOLN  COMPARISON:  Chest radiograph 04/16/2014  FINDINGS: Endotracheal tube is in satisfactory position. An enteric tube can be followed into the stomach and coils in the fundus, but its distal tip is  not included on this exam.  The heart is moderately enlarged and all 4 chambers of the heart appear slightly dilated. There is some reflux of contrast into the inferior vena cava and hepatic veins. Negative a pleural the of the.  Central pulmonary arteries are normal in size. Pulmonary arterial tree is well opacified with contrast. No focal filling defects are identified to suggest pulmonary embolism.  Lung windows demonstrate slight respiratory motion. There is mild dependent atelectasis in both lower lobes. No airspace disease. The trachea and mainstem bronchi are patent.  Images of the upper abdomen somewhat limited by early arterial phase imaging. 7 mm low-density lesion in the left lobe of the liver measures water density, compatible with a small cyst.  There is some endplate sclerosis at the C6-C7 level. Thoracic spine vertebral bodies are normal in height and alignment. No suspicious osseous lesion or fracture.  Review of the MIP images confirms the above findings.  IMPRESSION: 1. Negative for pulmonary embolism. 2. Cardiomegaly with suggestion of mild dilatation of all 4 chambers of the heart. There is some reflux of contrast into the inferior vena cava and hepatic veins, which can be seen in the setting of elevated right heart pressures. 3. Mild atelectasis in both lower lobes of lungs. Otherwise, the lungs are clear. 4. An enteric tube can be followed into the stomach, but its distal tip is not included on the study.   Electronically Signed   By: Curlene Dolphin M.D.   On: 04/16/2014 13:37   Dg Chest Port 1 View  04/16/2014   CLINICAL DATA:  Evaluate for airspace disease and evaluate endotracheal tube  EXAM: PORTABLE CHEST - 1 VIEW  COMPARISON:  04/15/2014  FINDINGS: Endotracheal tube 3.2 cm above the carina. NG tube crosses into the stomach.  Mild cardiac enlargement stable. There is stable mild vascular congestion. No evidence of consolidation or pulmonary edema.  IMPRESSION: No evidence of pulmonary  edema.  Endotracheal tube as described.   Electronically Signed   By: Skipper Cliche M.D.   On: 04/16/2014 07:38     ASSESSMENT / PLAN:  PULMONARY OETT 9/11 >> A: Acute Respiratory Failure Not impressed infection PNA, r/o bronchitis Concern for Anaphylactic Reaction - main issue on why remains on vent Asthma with exacerbation P:   Control BP Scheduled BD's + PRN Solu-medrol, benadryl, pepcid - maintain Wheezing unchanged, escalate steroids  Wean cpap 5 ps 10, failed 5, goal 4-6 hrs Will need leak testing Holding lasix with hyperdynamic LV on echo  CARDIOVASCULAR CVL A:  No evidence ischemia HTN may be main issue on presentation to PCP P:  echo reviewed Hold ACE given hx of Asthma and it is listed as an  allergy, and rise crt PRN hydralazine for SBP >150 NO BB  RENAL A:   Mild K 5.1, s/op contrast P:   Hyperdynamic LV, tom some degree from lvh, no lasix Chem in am, urine output good  GASTROINTESTINAL A:   Ventilator Associated Dysphagia  P:   Continue TF SUP:  Pepcid (anaphylaxis)  HEMATOLOGIC A:   No acute issues - normal CBC on admission  P:  Monitor CBC further Sub q hep  INFECTIOUS A:   R/O CAP - unlikley R/o bronchitis P:   BCx2 9/11 >>  UA/UC 9/11 >> Sputum 9/11 >> HIV 9/11 >>  neg Abx: Rocephin, start date 9/11>>>9/13 Azithro, start date 9/11>>>consider 5 days   Pct algo to liit abx Ct chest  ENDOCRINE A:   Hyperglycemia  - initial likely a stress response, steroid administration  P:   SSI moderate scale while on steroids  NEUROLOGIC A:   Acute Encephalopathy - in setting of sedation + hypercarbic resp failure with anaphalyxis P:   RASS goal: -2 Propofol for sedation with wua PRN fentanyl   TODAY'S SUMMARY: steroid increase, hold lasix for now, control BP, wean attempts  I have personally obtained a history, examined the patient, evaluated laboratory and imaging results, formulated the assessment and plan and placed  orders.  CRITICAL CARE: The patient is critically ill with multiple organ systems failure and requires high complexity decision making for assessment and support, frequent evaluation and titration of therapies, application of advanced monitoring technologies and extensive interpretation of multiple databases. Critical Care Time devoted to patient care services described in this note is 30 minutes.    Lavon Paganini. Titus Mould, MD, [place] Pgr: [place] [facility]  04/17/2014, 9:26 AM Lavon Paganini. Titus Mould, MD, [place] Pgr: [place] [facility]

## 2014-04-17 NOTE — Progress Notes (Signed)
Utilization review completed.  

## 2014-04-17 NOTE — Progress Notes (Signed)
RN increased sedation, low min volume, low RR alarming.  Placed pt back on full vent support.

## 2014-04-18 ENCOUNTER — Inpatient Hospital Stay (HOSPITAL_COMMUNITY): Payer: No Typology Code available for payment source

## 2014-04-18 DIAGNOSIS — J45902 Unspecified asthma with status asthmaticus: Secondary | ICD-10-CM

## 2014-04-18 DIAGNOSIS — I498 Other specified cardiac arrhythmias: Secondary | ICD-10-CM

## 2014-04-18 DIAGNOSIS — R410 Disorientation, unspecified: Secondary | ICD-10-CM

## 2014-04-18 DIAGNOSIS — J9602 Acute respiratory failure with hypercapnia: Secondary | ICD-10-CM

## 2014-04-18 DIAGNOSIS — R000 Tachycardia, unspecified: Secondary | ICD-10-CM

## 2014-04-18 DIAGNOSIS — I10 Essential (primary) hypertension: Secondary | ICD-10-CM

## 2014-04-18 DIAGNOSIS — R404 Transient alteration of awareness: Secondary | ICD-10-CM

## 2014-04-18 LAB — BASIC METABOLIC PANEL
ANION GAP: 16 — AB (ref 5–15)
Anion gap: 12 (ref 5–15)
Anion gap: 12 (ref 5–15)
BUN: 14 mg/dL (ref 6–23)
BUN: 20 mg/dL (ref 6–23)
BUN: 22 mg/dL (ref 6–23)
CHLORIDE: 95 meq/L — AB (ref 96–112)
CHLORIDE: 109 meq/L (ref 96–112)
CO2: 14 mEq/L — ABNORMAL LOW (ref 19–32)
CO2: 20 mEq/L (ref 19–32)
CO2: 23 meq/L (ref 19–32)
CREATININE: 0.73 mg/dL (ref 0.50–1.10)
CREATININE: 0.75 mg/dL (ref 0.50–1.10)
Calcium: 5.7 mg/dL — CL (ref 8.4–10.5)
Calcium: 8.8 mg/dL (ref 8.4–10.5)
Calcium: 9.5 mg/dL (ref 8.4–10.5)
Chloride: 112 mEq/L (ref 96–112)
Creatinine, Ser: 0.59 mg/dL (ref 0.50–1.10)
GFR calc non Af Amer: >90 mL/min (ref >90)
GFR calc non Af Amer: >90 mL/min (ref >90)
GFR calc non Af Amer: >90 mL/min (ref >90)
GLUCOSE: 112 mg/dL — AB (ref 70–99)
Glucose, Bld: 156 mg/dL — ABNORMAL HIGH (ref 70–99)
Glucose, Bld: 114 mg/dL — ABNORMAL HIGH (ref 70–99)
POTASSIUM: 6.1 meq/L — AB (ref 3.7–5.3)
POTASSIUM: 4.9 meq/L (ref 3.7–5.3)
Potassium: 4.5 mEq/L (ref 3.7–5.3)
SODIUM: 148 meq/L — AB (ref 137–147)
Sodium: 121 mEq/L — CL (ref 137–147)
Sodium: 144 mEq/L (ref 137–147)

## 2014-04-18 LAB — BLOOD GAS, ARTERIAL
Acid-Base Excess: 2.8 mmol/L — ABNORMAL HIGH (ref 0.0–2.0)
Bicarbonate: 28.1 mEq/L — ABNORMAL HIGH (ref 20.0–24.0)
Delivery systems: POSITIVE
Drawn by: 252031
Expiratory PAP: 6
FIO2: 1 %
Inspiratory PAP: 16
O2 Saturation: 93.6 %
PH ART: 7.337 — AB (ref 7.350–7.450)
PO2 ART: 73.4 mmHg — AB (ref 80.0–100.0)
PRESSURE SUPPORT: 10 cmH2O
Patient temperature: 98.6
TCO2: 29.7 mmol/L (ref 0–100)
pCO2 arterial: 53.9 mmHg — ABNORMAL HIGH (ref 35.0–45.0)

## 2014-04-18 LAB — GLUCOSE, CAPILLARY
GLUCOSE-CAPILLARY: 155 mg/dL — AB (ref 70–99)
GLUCOSE-CAPILLARY: 161 mg/dL — AB (ref 70–99)
GLUCOSE-CAPILLARY: 128 mg/dL — AB (ref 70–99)
GLUCOSE-CAPILLARY: 142 mg/dL — AB (ref 70–99)
Glucose-Capillary: 135 mg/dL — ABNORMAL HIGH (ref 70–99)
Glucose-Capillary: 166 mg/dL — ABNORMAL HIGH (ref 70–99)

## 2014-04-18 LAB — PROCALCITONIN: PROCALCITONIN: 0.45 ng/mL

## 2014-04-18 MED ORDER — DILTIAZEM HCL 100 MG IV SOLR
5.0000 mg/h | INTRAVENOUS | Status: DC
  Filled 2014-04-18: qty 100

## 2014-04-18 MED ORDER — WHITE PETROLATUM GEL
Status: AC
  Administered 2014-04-18: 0.2
  Filled 2014-04-18: qty 5

## 2014-04-18 MED ORDER — DILTIAZEM LOAD VIA INFUSION
10.0000 mg | Freq: Once | INTRAVENOUS | Status: DC
  Filled 2014-04-18: qty 10

## 2014-04-18 MED ORDER — METHYLPREDNISOLONE SODIUM SUCC 40 MG IJ SOLR
40.0000 mg | Freq: Two times a day (BID) | INTRAMUSCULAR | Status: DC
  Administered 2014-04-18 – 2014-04-19 (×2): 40 mg via INTRAVENOUS
  Filled 2014-04-18 (×5): qty 1

## 2014-04-18 MED ORDER — FENTANYL CITRATE 0.05 MG/ML IJ SOLN
INTRAMUSCULAR | Status: AC
  Administered 2014-04-18: 25 ug via INTRAVENOUS
  Filled 2014-04-18: qty 2

## 2014-04-18 MED ORDER — METOPROLOL TARTRATE 1 MG/ML IV SOLN
2.5000 mg | INTRAVENOUS | Status: DC | PRN
  Administered 2014-04-18 – 2014-04-20 (×4): 5 mg via INTRAVENOUS
  Filled 2014-04-18 (×5): qty 5

## 2014-04-18 MED ORDER — CLONIDINE HCL 0.3 MG/24HR TD PTWK
0.3000 mg | MEDICATED_PATCH | TRANSDERMAL | Status: DC
  Administered 2014-04-18: 0.3 mg via TRANSDERMAL
  Filled 2014-04-18 (×2): qty 1

## 2014-04-18 MED ORDER — FENTANYL CITRATE 0.05 MG/ML IJ SOLN
12.5000 ug | INTRAMUSCULAR | Status: DC | PRN
  Administered 2014-04-18 – 2014-04-19 (×3): 25 ug via INTRAVENOUS
  Administered 2014-04-21 (×2): 12.5 ug via INTRAVENOUS
  Administered 2014-04-21: 25 ug via INTRAVENOUS
  Filled 2014-04-18 (×6): qty 2

## 2014-04-18 MED ORDER — NICARDIPINE HCL IN NACL 20-0.86 MG/200ML-% IV SOLN: 3.0000 mg/h | INTRAVENOUS | Status: DC

## 2014-04-18 MED ORDER — FUROSEMIDE 10 MG/ML IJ SOLN
20.0000 mg | Freq: Once | INTRAMUSCULAR | Status: AC
  Administered 2014-04-18: 20 mg via INTRAVENOUS
  Filled 2014-04-18: qty 2

## 2014-04-18 MED ORDER — BUDESONIDE 0.25 MG/2ML IN SUSP
0.2500 mg | Freq: Two times a day (BID) | RESPIRATORY_TRACT | Status: DC
  Administered 2014-04-18 – 2014-04-25 (×13): 0.25 mg via RESPIRATORY_TRACT
  Filled 2014-04-18 (×16): qty 2

## 2014-04-18 MED ORDER — METOPROLOL TARTRATE 1 MG/ML IV SOLN
5.0000 mg | Freq: Once | INTRAVENOUS | Status: AC
Start: 2014-04-18 — End: 2014-04-18
  Administered 2014-04-18: 5 mg via INTRAVENOUS

## 2014-04-18 MED ORDER — MIDAZOLAM HCL 2 MG/2ML IJ SOLN
INTRAMUSCULAR | Status: AC
  Filled 2014-04-18: qty 2

## 2014-04-18 MED ORDER — CLONIDINE HCL 0.1 MG PO TABS
0.1000 mg | ORAL_TABLET | Freq: Two times a day (BID) | ORAL | Status: DC
  Administered 2014-04-18: 0.1 mg via ORAL
  Filled 2014-04-18 (×2): qty 1

## 2014-04-18 MED ORDER — DEXMEDETOMIDINE HCL IN NACL 200 MCG/50ML IV SOLN
0.4000 ug/kg/h | INTRAVENOUS | Status: DC
  Administered 2014-04-18: 0.4 ug/kg/h via INTRAVENOUS
  Administered 2014-04-18: 0.7 ug/kg/h via INTRAVENOUS
  Administered 2014-04-18: 0.8 ug/kg/h via INTRAVENOUS
  Administered 2014-04-18: 0.9 ug/kg/h via INTRAVENOUS
  Administered 2014-04-19: 0.8 ug/kg/h via INTRAVENOUS
  Administered 2014-04-19: 0.9 ug/kg/h via INTRAVENOUS
  Administered 2014-04-19: 0.4 ug/kg/h via INTRAVENOUS
  Filled 2014-04-18: qty 100
  Filled 2014-04-18 (×5): qty 50

## 2014-04-18 NOTE — Progress Notes (Signed)
200cc fentanyl wasted from a 250cc fentanyl bag. witnessed by Annietta Minor

## 2014-04-18 NOTE — Significant Event (Signed)
Acute resp distress Severe hypertension with tachycardia (sinus) Reportedly wheezing Suspected aspiration (witnessed by RN) CXR unimpressive Well supported on NPPV  IMP: Acute recurrent resp failure Recurrent bronchospasm Possible aspiration Severe hypertension Sinus tachycardia  PLAN: Cont NPPV Received single dose of Lasix with good response Diltiazem gtt ordered Change clonidine to transdermal NPO Needs SLP eval  Merton Border, MD ; [facility] service Mobile [phone].  After 5:30 PM or weekends, call [phone]

## 2014-04-18 NOTE — Progress Notes (Signed)
PULMONARY / CRITICAL CARE MEDICINE   Name: Tammie [name] MRN: [id number] DOB: 23-May-1961    ADMISSION DATE:  04/15/2014  REFERRING MD :  EDP   CHIEF COMPLAINT:  Acute Respiratory Failure, Anaphylaxis   INITIAL PRESENTATION: 53 y/o [name] admitted to [facility] on 9/11 after being seen for cough, congestion and chest pain.  Medicated with ASA & NTG.  Pt developed increased SOB & Anaphylactic reaction requiring emergent intubation. PCCM called for admission.   STUDIES:  9/11 ECHO >>diast 1, EF 65%-70%, some lvh 9/12 CT>>>no PE  SIGNIFICANT EVENTS: 9/11  Admit with cough, congestion, chest pain, HTN , anaphylaxis with ASA (was not listed as an allergy) 9/12 trop neg 9/14 Extubated. Agitated with hypertension and tachycardia post extubation. Precedex initiated. Much improved. Clonidine ordered  SUBJECTIVE:   RASS +1 to + 2. Tearful. No resp distress post extubation  VITAL SIGNS: Temp:  [98.2 F (36.8 C)-98.5 F (36.9 C)] 98.2 F (36.8 C) (09/14 1345) Pulse Rate:  [52-127] 116 (09/14 0800) Resp:  [14-21] 14 (09/14 0738) BP: (109-200)/(60-178) 181/96 mmHg (09/14 1300) SpO2:  [95 %-100 %] 95 % (09/14 1028) FiO2 (%):  [36 %-40 %] 36 % (09/14 0919) Weight:  [64.7 kg (142 lb 10.2 oz)] 64.7 kg (142 lb 10.2 oz) (09/14 0400)  HEMODYNAMICS:    VENTILATOR SETTINGS: Vent Mode:  [-] PRVC FiO2 (%):  [36 %-40 %] 36 % Set Rate:  [14 bmp] 14 bmp Vt Set:  [400 mL] 400 mL PEEP:  [5 cmH20] 5 cmH20 Plateau Pressure:  [22 cmH20-24 cmH20] 23 cmH20  INTAKE / OUTPUT:  Intake/Output Summary (Last 24 hours) at 04/18/14 1403 Last data filed at 04/18/14 1300  Gross per 24 hour  Intake 1986.66 ml  Output   1860 ml  Net 126.66 ml    PHYSICAL EXAMINATION: General: RASS +1, + F/C Neuro:  No focal deficits HEENT:  Plethoric, NAD Cardiovascular:  RRR s M Lungs:  Slightly coarse without wheezes Abdomen:  Soft, +BS Ext: no edema Skin:  Warm/dry, few scattered lesions on LE's /  scabs  LABS:  CBC  Recent Labs Lab 04/16/14 0342 04/17/14 0343 04/18/14 0320  WBC 8.8 13.5* 10.6*  HGB 12.1 12.2 10.0*  HCT 34.4* 36.7 27.2*  PLT 277 287 210   Coag's No results found for this basename: APTT, INR,  in the last 168 hours  BMET  Recent Labs Lab 04/17/14 0343 04/18/14 0320 04/18/14 0539  NA 142 121* 144  K 5.1 4.5 6.1*  CL 111 95* 112  CO2 21 14* 20  BUN 20 14 20   CREATININE 0.96 0.59 0.73  GLUCOSE 157* 112* 156*   Electrolytes  Recent Labs Lab 04/16/14 2120 04/17/14 0343 04/17/14 1253 04/17/14 2210 04/18/14 0320 04/18/14 0539  CALCIUM  --  8.5  --   --  5.7* 8.8  MG 2.5  --  2.3 2.4  --   --   PHOS 3.6  --  3.7 3.0  --   --    Sepsis Markers  Recent Labs Lab 04/16/14 0908 04/17/14 0343 04/18/14 0539  PROCALCITON 1.56 1.04 0.45    ABG  Recent Labs Lab 04/15/14 1355 04/15/14 1607 04/16/14 0405  PHART 7.135* 7.426 7.437  PCO2ART 88.4* 42.3 33.4*  PO2ART 204.0* 246.0* 124.0*   Liver Enzymes  Recent Labs Lab 04/15/14 1359 04/17/14 0343  AST 30 21  ALT 23 16  ALKPHOS 112 72  BILITOT 1.1 <0.2*  ALBUMIN 3.5 2.9*   Cardiac Enzymes  Recent Labs  Lab 04/15/14 1359 04/15/14 2000 04/16/14 0338  TROPONINI <0.30 <0.30 <0.30  PROBNP 26.9  --   --    Glucose  Recent Labs Lab 04/17/14 1516 04/17/14 2024 04/18/14 0006 04/18/14 0441 04/18/14 0717 04/18/14 1343  GLUCAP 143* 152* 135* 155* 161* 128*    Imaging Dg Chest Port 1 View  04/17/2014   CLINICAL DATA:  Evaluate for pulmonary edema.  EXAM: PORTABLE CHEST - 1 VIEW  COMPARISON:  Chest x-ray 04/16/2014.  FINDINGS: An endotracheal tube is in place with tip 3.3 cm above the carina. A nasogastric tube is seen extending into the stomach, however, the tip of the nasogastric tube extends below the lower margin of the image. Lung volumes are slightly low and there are some linear bibasilar opacities most compatible with mild subsegmental atelectasis. No confluent  consolidative airspace disease. No pleural effusions. Mild crowding of the pulmonary vasculature, accentuated by low lung volumes, without frank pulmonary edema. Mild cardiomegaly is unchanged. Upper mediastinal contours are within normal limits.  IMPRESSION: 1. Support apparatus, as above. 2. Low lung volumes with mild bibasilar subsegmental atelectasis. 3. Cardiomegaly. 4. No evidence of pulmonary edema.   Electronically Signed   By: Vinnie Langton M.D.   On: 04/17/2014 07:22   CXR: Slight generalized haziness on L  ASSESSMENT / PLAN:  PULMONARY ETT 9/11 >> 9/14 A: Acute Respiratory Failure Status asthmaticus - resolved P:   Monitor in ICU post extubation Supp O2 as needed Cont nebulized BDs Add nebulized steroids Taper systemic steroids (rapidly if possible, given agitation) Consider leukotriene inhibitor given reported hx of nasal polyps and ASA sens  CARDIOVASCULAR CVL A:  Severe hypertension Sinus tachycardia P:  PRN metoprolol to maintain HR < 115/min PRN hydralazine to maintain SBP < 170 mmHg Initiate clonidine 9/14  RENAL A:   Hyperkalemia, suspect artifactual P:   Monitor BMET intermittently Monitor I/Os Correct electrolytes as indicated Recheck BMET this afternoon Avoid ACEI and ARBs  GASTROINTESTINAL A:   Post extubation dysphagia P:   SUP: N/I post extubation NPO X 4 hrs, then adv diet as toelrated  HEMATOLOGIC A:   No acute issues P:  DVT px: SQ heparin Monitor CBC intermittently Transfuse per usual ICU guidelines  INFECTIOUS A:   No acute infectious issues identified Minimally elevated PCT P:   BCx2 9/11 >> NEG UA/UC 9/11 >> NEG Sputum 9/11 >> NOF HIV 9/11 >>   NEG Abx: Rocephin, start date 9/11>>>9/13 Azithro, start date 9/11 >> 9/14    ENDOCRINE A:   Hyperglycemia, steroid induced - mild P:   Taper steroids DC SSI Monitor glu on chem panels Resume SSI for glu > 180  NEUROLOGIC A:   Acute Encephalopathy Severe  agitation P:   RASS goal: 0 Dex initiated 9/14 Begin clonidine and taper dex off as tolerated   TODAY'S SUMMARY:   I have personally obtained a history, examined the patient, evaluated laboratory and imaging results, formulated the assessment and plan and placed orders.  CRITICAL CARE: The patient is critically ill with multiple organ systems failure and requires high complexity decision making for assessment and support, frequent evaluation and titration of therapies, application of advanced monitoring technologies and extensive interpretation of multiple databases. Critical Care Time devoted to patient care services described in this note is 35 minutes.    Merton Border, MD ; [facility] [phone].  After 5:30 PM or weekends, call [phone]   04/18/2014, 2:03 PM

## 2014-04-18 NOTE — Progress Notes (Addendum)
   Call from RN. Patient after drinking sip of water -> sudden desat -> needing bipap  Variable  0 Points 1 Point 2 Points Total  Heart rate per minute  <90 beats 90-109 beats >110 beats 2  Respiratory  Rate per minute < 18 breaths 19-30 breaths  >30 breaths 2  Restlessness; nonpurposeful movements None  occas slight movement Frequent movement 1  Paradoxical breathing pattern: None  Present 0  Accessory muscle use: rise in clavicle during inspiration None Slight rise Pronounced rise 1  Grunting at end-expiration: guttural sound None  Present 2  Nasal flaring: involuntary movement of nares None  Present 1  Look of fear None  Eyes wide 0  Overall total out of 16    10    Respiratory Distress Observation Scale Journal of Palliative Medicine Vol. 13, Number 3, 2010 Campbell et al.   Acute resp failuyre   - resi distress scale 10 of 16  Plan contnue bipap - which rn says is helping  -s tat cxr - stat abg  - lasix 20nmg IV x 1  Dr. Brand Males, M.D., [facility].C.P Pulmonary and Critical Care Medicine Staff Physician [facility] [facility] Pager: [phone], If no answer or between  15:00h - 7:00h: call [phone]  04/18/2014 9:25 PM    Post lab review 10:21 PM 04/18/2014 Now RN saying she is agitated BP 629 systolic HR 528  Recent Labs Lab 04/15/14 1355 04/15/14 1607 04/16/14 0405 04/18/14 2119  PHART 7.135* 7.426 7.437 7.337*  PCO2ART 88.4* 42.3 33.4* 53.9*  PO2ART 204.0* 246.0* 124.0* 73.4*  HCO3 29.8* 27.9* 22.2 28.1*  TCO2 32 29 23.2 29.7  O2SAT 99.0 100.0 99.1 93.6    Dg Chest Port 1 View  04/18/2014   CLINICAL DATA:  Respiratory failure.  EXAM: PORTABLE CHEST - 1 VIEW  COMPARISON:  04/18/2014 earlier.  FINDINGS: The lungs are somewhat hypoinflated without focal consolidation or effusion. Cardiomediastinal silhouette and remainder the exam is unchanged.  IMPRESSION: Hypoinflation without acute cardiopulmonary disease.    Electronically Signed   By: Marin Olp M.D.   On: 04/18/2014 21:35    Plan cotninue bipap Start precedex -for agittion; can help with bp/hr; bedside MD Dr Alva Garnet at bedside to decide on thisd    Dr. Brand Males, M.D., [facility].C.P Pulmonary and Critical Care Medicine Staff Physician [place] [facility] Pager: [phone], If no answer or between  15:00h - 7:00h: call [phone]  04/18/2014 10:27 PM

## 2014-04-18 NOTE — Progress Notes (Signed)
CRITICAL VALUE ALERT  Critical value received:  Na 121, Ca 5.7  Date of notification:  04/18/2014  Time of notification:  05:15  Critical value read back:Yes.    Nurse who received alert:  BShepherd, RN  MD notified (1st page):  Deterding, E.  Time of first page:  05:20  MD notified (2nd page):  Time of second page:  Responding MD:  Deterding, E.  Time MD responded:  05:20  MD order received to repeat BMET

## 2014-04-18 NOTE — Progress Notes (Signed)
PULMONARY / CRITICAL CARE MEDICINE   Name: Tammie [name] MRN: [id number] DOB: Dec 19, 1960    ADMISSION DATE:  04/15/2014  REFERRING MD :  EDP   CHIEF COMPLAINT:  Acute Respiratory Failure, Anaphylaxis   INITIAL PRESENTATION: 53 y/o [name] admitted to [facility] on 9/11 after being seen for cough, congestion and chest pain.  Medicated with ASA & NTG.  Pt developed increased SOB & Anaphylactic reaction requiring emergent intubation. PCCM called for admission.   STUDIES:  9/11 ECHO >>diast 1, EF 65%-70%, some lvh 9/12 CT>>>no PE  SIGNIFICANT EVENTS: 9/11  Admit with cough, congestion, chest pain, HTN , anaphylaxis with ASA (was not listed as an allergy) 9/12 trop neg  SUBJECTIVE:  Remained vented, nursing reports uneventful night  VITAL SIGNS: Temp:  [98.2 F (36.8 C)-98.5 F (36.9 C)] 98.2 F (36.8 C) (09/14 1345) Pulse Rate:  [52-127] 116 (09/14 0800) Resp:  [14-21] 14 (09/14 0738) BP: (109-200)/(60-178) 181/96 mmHg (09/14 1300) SpO2:  [95 %-100 %] 95 % (09/14 1028) FiO2 (%):  [36 %-40 %] 36 % (09/14 0919) Weight:  [142 lb 10.2 oz (64.7 kg)] 142 lb 10.2 oz (64.7 kg) (09/14 0400)  HEMODYNAMICS:    VENTILATOR SETTINGS: Vent Mode:  [-] PRVC FiO2 (%):  [36 %-40 %] 36 % Set Rate:  [14 bmp] 14 bmp Vt Set:  [400 mL] 400 mL PEEP:  [5 cmH20] 5 cmH20 Plateau Pressure:  [22 cmH20-24 cmH20] 23 cmH20  INTAKE / OUTPUT:  Intake/Output Summary (Last 24 hours) at 04/18/14 1400 Last data filed at 04/18/14 1300  Gross per 24 hour  Intake 1986.66 ml  Output   1860 ml  Net 126.66 ml    PHYSICAL EXAMINATION: General:  wdwn adult female restless on vent Neuro:   rass +1 HEENT:  OETT Cardiovascular:  s1s2 rrr, no m/r/g Lungs:  Coarse, wheezing bilateral moderate exp Abdomen:  Round/soft, bsx4 active  Musculoskeletal:  No acute deformities  Skin:  Warm/dry, few scattered lesions on LE's / scabs  LABS:  CBC  Recent Labs Lab 04/16/14 0342 04/17/14 0343 04/18/14 0320  WBC 8.8 13.5*  10.6*  HGB 12.1 12.2 10.0*  HCT 34.4* 36.7 27.2*  PLT 277 287 210   Coag's No results found for this basename: APTT, INR,  in the last 168 hours  BMET  Recent Labs Lab 04/17/14 0343 04/18/14 0320 04/18/14 0539  NA 142 121* 144  K 5.1 4.5 6.1*  CL 111 95* 112  CO2 21 14* 20  BUN 20 14 20   CREATININE 0.96 0.59 0.73  GLUCOSE 157* 112* 156*   Electrolytes  Recent Labs Lab 04/16/14 2120 04/17/14 0343 04/17/14 1253 04/17/14 2210 04/18/14 0320 04/18/14 0539  CALCIUM  --  8.5  --   --  5.7* 8.8  MG 2.5  --  2.3 2.4  --   --   PHOS 3.6  --  3.7 3.0  --   --    Sepsis Markers  Recent Labs Lab 04/16/14 0908 04/17/14 0343 04/18/14 0539  PROCALCITON 1.56 1.04 0.45    ABG  Recent Labs Lab 04/15/14 1355 04/15/14 1607 04/16/14 0405  PHART 7.135* 7.426 7.437  PCO2ART 88.4* 42.3 33.4*  PO2ART 204.0* 246.0* 124.0*   Liver Enzymes  Recent Labs Lab 04/15/14 1359 04/17/14 0343  AST 30 21  ALT 23 16  ALKPHOS 112 72  BILITOT 1.1 <0.2*  ALBUMIN 3.5 2.9*   Cardiac Enzymes  Recent Labs Lab 04/15/14 1359 04/15/14 2000 04/16/14 0338  TROPONINI <0.30 <0.30 <0.30  PROBNP 26.9  --   --    Glucose  Recent Labs Lab 04/17/14 1516 04/17/14 2024 04/18/14 0006 04/18/14 0441 04/18/14 0717 04/18/14 1343  GLUCAP 143* 152* 135* 155* 161* 128*    Imaging Dg Chest Port 1 View  04/17/2014   CLINICAL DATA:  Evaluate for pulmonary edema.  EXAM: PORTABLE CHEST - 1 VIEW  COMPARISON:  Chest x-ray 04/16/2014.  FINDINGS: An endotracheal tube is in place with tip 3.3 cm above the carina. A nasogastric tube is seen extending into the stomach, however, the tip of the nasogastric tube extends below the lower margin of the image. Lung volumes are slightly low and there are some linear bibasilar opacities most compatible with mild subsegmental atelectasis. No confluent consolidative airspace disease. No pleural effusions. Mild crowding of the pulmonary vasculature, accentuated by  low lung volumes, without frank pulmonary edema. Mild cardiomegaly is unchanged. Upper mediastinal contours are within normal limits.  IMPRESSION: 1. Support apparatus, as above. 2. Low lung volumes with mild bibasilar subsegmental atelectasis. 3. Cardiomegaly. 4. No evidence of pulmonary edema.   Electronically Signed   By: Vinnie Langton M.D.   On: 04/17/2014 07:22     ASSESSMENT / PLAN:  PULMONARY OETT 9/11 >>9/14 A: Acute Respiratory Failure Not impressed infection PNA, r/o bronchitis Concern for Anaphylactic Reaction  Asthma with exacerbation P:   Control BP Scheduled BD's + PRN Solu-medrol, pepcid - maintain Wheezing unchanged, maintain current dose steroids  Monitor post extubation Holding lasix with hyperdynamic LV on echo  CARDIOVASCULAR CVL A:  No evidence ischemia HTN may be main issue on presentation to PCP P:  echo reviewed Hold ACE given hx of Asthma and it is listed as an allergy, and rise crt PRN hydralazine for SBP >150 Lopressor q3h PRN to maintain HR<115 NO BB?  RENAL A:   Mild K 6.1, s/op contrast? P:   Hyperdynamic LV, tom some degree from lvh, no lasix Chem in am, urine output good  GASTROINTESTINAL A:   Post extubation P:   Progress diet as tolerated SUP:  Pepcid (anaphylaxis)  HEMATOLOGIC A:   No acute issues - normal CBC on admission  P:  Monitor CBC further Sub q hep  INFECTIOUS A:   R/O CAP - unlikley R/o bronchitis P:   BCx2 9/11 >>  UA/UC 9/11 >> Sputum 9/11 >> HIV 9/11 >>  neg Abx: Rocephin, start date 9/11>>>9/13 Azithro, start date 9/11>>>9/14  Pct algo to liit abx? Ct chest?  ENDOCRINE A:   No acute issues P:   Monitor CBG  NEUROLOGIC A:   Acute Encephalopathy - in setting of sedation + hypercarbic resp failure with anaphalyxis Pain P:   Progress mobility as tolerated PT consult PRN fentanyl   TODAY'S SUMMARY: extubated, monitor HR and BP  I have personally obtained a history, examined the  patient, evaluated laboratory and imaging results, formulated the assessment and plan and placed orders.  CRITICAL CARE: The patient is critically ill with multiple organ systems failure and requires high complexity decision making for assessment and support, frequent evaluation and titration of therapies, application of advanced monitoring technologies and extensive interpretation of multiple databases. Critical Care Time devoted to patient care services described in this note is ___ minutes.    Magdalene River, PA-S  [facility]     PCCM ATTENDING: See my note also  Merton Border, MD;  PCCM service; [place] [phone]

## 2014-04-18 NOTE — Procedures (Signed)
Extubation Procedure Note  Patient Details:   Name: Tammie [name] DOB: May 19, 1961 MRN: [id number]   Airway Documentation:     Evaluation  O2 sats: stable throughout Complications: No apparent complications Patient did tolerate procedure well. Bilateral Breath Sounds: Expiratory wheezes;Rhonchi Suctioning: Airway Yes Patient extubated to [place]. No complications. Vital signs stable at this time. RN at bedside. RT will continue to monitor.  Mcneil Sober 04/18/2014, 9:27 AM

## 2014-04-19 ENCOUNTER — Inpatient Hospital Stay (HOSPITAL_COMMUNITY): Payer: No Typology Code available for payment source

## 2014-04-19 ENCOUNTER — Encounter (HOSPITAL_COMMUNITY): Payer: Self-pay | Admitting: General Practice

## 2014-04-19 DIAGNOSIS — J9600 Acute respiratory failure, unspecified whether with hypoxia or hypercapnia: Secondary | ICD-10-CM

## 2014-04-19 HISTORY — DX: Acute respiratory failure, unspecified whether with hypoxia or hypercapnia: J96.00

## 2014-04-19 LAB — CBC WITH DIFFERENTIAL/PLATELET
Basophils Absolute: 0 10*3/uL (ref 0.0–0.1)
Basophils Relative: 0 % (ref 0–1)
EOS PCT: 0 % (ref 0–5)
Eosinophils Absolute: 0 10*3/uL (ref 0.0–0.7)
HCT: 27.2 % — ABNORMAL LOW (ref 36.0–46.0)
Hemoglobin: 10 g/dL — ABNORMAL LOW (ref 12.0–15.0)
Lymphocytes Relative: 3 % — ABNORMAL LOW (ref 12–46)
Lymphs Abs: 0.3 10*3/uL — ABNORMAL LOW (ref 0.7–4.0)
MCH: 32.2 pg (ref 26.0–34.0)
MCHC: 35.5 g/dL (ref 30.0–36.0)
MCV: 88.6 fL (ref 78.0–100.0)
MONOS PCT: 4 % (ref 3–12)
Monocytes Absolute: 0.4 10*3/uL (ref 0.1–1.0)
NEUTROS PCT: 93 % — AB (ref 43–77)
Neutro Abs: 9.9 10*3/uL — ABNORMAL HIGH (ref 1.7–7.7)
PLATELETS: 210 10*3/uL (ref 150–400)
RBC: 3.07 MIL/uL — AB (ref 3.87–5.11)
RDW: 14.1 % (ref 11.5–15.5)
WBC: 10.6 10*3/uL — ABNORMAL HIGH (ref 4.0–10.5)

## 2014-04-19 LAB — BASIC METABOLIC PANEL
Anion gap: 11 (ref 5–15)
BUN: 28 mg/dL — ABNORMAL HIGH (ref 6–23)
CALCIUM: 9.3 mg/dL (ref 8.4–10.5)
CO2: 28 mEq/L (ref 19–32)
Chloride: 112 mEq/L (ref 96–112)
Creatinine, Ser: 0.9 mg/dL (ref 0.50–1.10)
GFR calc non Af Amer: 71 mL/min — ABNORMAL LOW (ref >90)
GFR, EST AFRICAN AMERICAN: 82 mL/min — AB (ref >90)
GLUCOSE: 124 mg/dL — AB (ref 70–99)
Potassium: 4.2 mEq/L (ref 3.7–5.3)
Sodium: 151 mEq/L — ABNORMAL HIGH (ref 137–147)

## 2014-04-19 LAB — CULTURE, RESPIRATORY

## 2014-04-19 LAB — CULTURE, RESPIRATORY W GRAM STAIN

## 2014-04-19 MED ORDER — METHYLPREDNISOLONE SODIUM SUCC 40 MG IJ SOLR
20.0000 mg | Freq: Two times a day (BID) | INTRAMUSCULAR | Status: DC
  Administered 2014-04-19 – 2014-04-20 (×2): 20 mg via INTRAVENOUS
  Filled 2014-04-19 (×4): qty 0.5

## 2014-04-19 MED ORDER — BISOPROLOL FUMARATE 5 MG PO TABS
2.5000 mg | ORAL_TABLET | Freq: Every day | ORAL | Status: DC
  Administered 2014-04-19 – 2014-04-25 (×7): 2.5 mg via ORAL
  Filled 2014-04-19 (×7): qty 0.5

## 2014-04-19 MED ORDER — DEXTROSE 5 % IV SOLN
INTRAVENOUS | Status: DC
  Administered 2014-04-19: 11:00:00 via INTRAVENOUS

## 2014-04-19 MED ORDER — IPRATROPIUM-ALBUTEROL 0.5-2.5 (3) MG/3ML IN SOLN
3.0000 mL | Freq: Two times a day (BID) | RESPIRATORY_TRACT | Status: DC
  Filled 2014-04-19: qty 3

## 2014-04-19 MED ORDER — CETYLPYRIDINIUM CHLORIDE 0.05 % MT LIQD
7.0000 mL | Freq: Two times a day (BID) | OROMUCOSAL | Status: DC
  Administered 2014-04-19 – 2014-04-25 (×11): 7 mL via OROMUCOSAL

## 2014-04-19 MED ORDER — HYDRALAZINE HCL 25 MG PO TABS
25.0000 mg | ORAL_TABLET | Freq: Three times a day (TID) | ORAL | Status: DC
  Administered 2014-04-19 – 2014-04-25 (×19): 25 mg via ORAL
  Filled 2014-04-19 (×21): qty 1

## 2014-04-19 MED ORDER — RESOURCE THICKENUP CLEAR PO POWD
ORAL | Status: DC | PRN
  Filled 2014-04-19: qty 125

## 2014-04-19 MED ORDER — FUROSEMIDE 10 MG/ML IJ SOLN
10.0000 mg | Freq: Every day | INTRAMUSCULAR | Status: DC
  Administered 2014-04-19 – 2014-04-24 (×6): 10 mg via INTRAVENOUS
  Filled 2014-04-19 (×5): qty 1
  Filled 2014-04-19: qty 2
  Filled 2014-04-19: qty 1

## 2014-04-19 NOTE — Evaluation (Signed)
Clinical/Bedside Swallow Evaluation Patient Details  Name: Tammie [name] MRN: [id number] Date of Birth: 07-Sep-1960  Today's Date: 04/19/2014 Time: 1050-1102 SLP Time Calculation (min): 12 min  Past Medical History:  Past Medical History  Diagnosis Date  . Hypertension   . Environmental allergies   . Asthma    Past Surgical History:  Past Surgical History  Procedure Laterality Date  . Abdominal hysterectomy     HPI:  53 y/o female admitted to [facility] on 9/11 after being seen for cough, congestion and chest pain. Medicated with ASA & NTG. Pt developed increased SOB & anaphylactic reaction requiring emergent intubation; extubated 9/14.  Nursing reports pt was given water to drink,  followed by a 30 min coughing spell during which pt desaturated, bipap was initiated with good results.  Now with encephalopathy, agitation; NPO.   Assessment / Plan / Recommendation Clinical Impression  Pt presents with an acute reversible dysphagia attributable to decreased mental status and likely impaired laryngeal closure during swallow (Phonation remains hoarse s/p intubation.)  Recommend initiating a dysphagia 2 diet with nectar-thick liquids; give meds whole in puree.  SLP will follow briefly for PO toleration.      Aspiration Risk  Moderate    Diet Recommendation Dysphagia 2 (Fine chop);Nectar-thick liquid   Liquid Administration via: Cup;No straw Medication Administration: Whole meds with puree Supervision: Full supervision/cueing for compensatory strategies (pt unable to self-feed due to UE weakness/incoord today) Compensations: Slow rate;Small sips/bites Postural Changes and/or Swallow Maneuvers: Seated upright 90 degrees    Other  Recommendations Oral Care Recommendations: Oral care BID   Follow Up Recommendations  None    Frequency and Duration min 2x/week  1 week   Pertinent Vitals/Pain No c/o pain    SLP Swallow Goals     Swallow Study Prior Functional Status       General  Date of Onset: 04/15/14 Type of Study: Bedside swallow evaluation Previous Swallow Assessment: none per records Diet Prior to this Study: NPO Temperature Spikes Noted: No Respiratory Status: Nasal cannula History of Recent Intubation: Yes Length of Intubations (days): 3 days Date extubated: 04/18/14 Behavior/Cognition: Alert;Confused;Distractible Oral Cavity - Dentition: Adequate natural dentition Self-Feeding Abilities: Needs assist Patient Positioning: Upright in chair Baseline Vocal Quality: Hoarse;Low vocal intensity Volitional Cough: Weak Volitional Swallow: Able to elicit    Oral/Motor/Sensory Function Overall Oral Motor/Sensory Function: Appears within functional limits for tasks assessed   Ice Chips Ice chips: Within functional limits Presentation: Spoon   Thin Liquid Thin Liquid: Impaired Presentation: Cup Oral Phase Impairments: Impaired anterior to posterior transit Pharyngeal  Phase Impairments: Multiple swallows;Wet Vocal Quality    Nectar Thick Nectar Thick Liquid: Within functional limits Presentation: Cup   Honey Thick Honey Thick Liquid: Not tested   Puree Puree: Within functional limits Presentation: [place]. [place], [place] CCC/SLP Pager [phone]     Solid: Not tested (pt declined- concerned unable to swallow)       Juan Quam Laurice 04/19/2014,11:11 AM

## 2014-04-19 NOTE — Progress Notes (Addendum)
PULMONARY / CRITICAL CARE MEDICINE   Name: Tammie [name] MRN: [id number] DOB: 02-01-1961    ADMISSION DATE:  04/15/2014  REFERRING MD :  EDP   CHIEF COMPLAINT:  Acute Respiratory Failure, Anaphylaxis   INITIAL PRESENTATION: 53 y/o [name] admitted to [facility] on 9/11 after being seen for cough, congestion and chest pain.  Medicated with ASA & NTG.  Pt developed increased SOB & Anaphylactic reaction requiring emergent intubation. PCCM called for admission.   STUDIES:  9/11 ECHO >>diast 1, EF 65%-70%, some lvh 9/12 CT>>>no PE  SIGNIFICANT EVENTS: 9/11  Admit with cough, congestion, chest pain, HTN , anaphylaxis with ASA (was not listed as an allergy) 9/12 trop neg 9/14 Extubated. Agitated with hypertension and tachycardia post extubation. Precedex initiated. Much improved. Clonidine ordered 9/14 sudden desat requiring bipap  SUBJECTIVE:   Pt on bipap, no apparent distress, nursing reports was given water to drink last pm (2000), this was followed by a 30 min coughing spell during which pt desaturated, Elink was contacted and bipap was initiated with good results and lasix 20mg  was administered  VITAL SIGNS: Temp:  [98 F (36.7 C)-98.5 F (36.9 C)] 98.4 F (36.9 C) (09/15 0700) Pulse Rate:  [75-105] 75 (09/15 0726) Resp:  [19-45] 24 (09/15 0600) BP: (88-245)/(55-178) 134/79 mmHg (09/15 0726) SpO2:  [78 %-100 %] 100 % (09/15 0726) FiO2 (%):  [36 %-100 %] 50 % (09/15 0729) Weight:  [139 lb 15.9 oz (63.5 kg)] 139 lb 15.9 oz (63.5 kg) (09/15 0500)  HEMODYNAMICS:    VENTILATOR SETTINGS: Vent Mode:  [-] BIPAP FiO2 (%):  [36 %-100 %] 50 % Set Rate:  [16 bmp] 16 bmp PEEP:  [6 cmH20] 6 cmH20 Pressure Support:  [14 cmH20] 14 cmH20  INTAKE / OUTPUT:  Intake/Output Summary (Last 24 hours) at 04/19/14 0803 Last data filed at 04/19/14 0700  Gross per 24 hour  Intake 420.84 ml  Output   3080 ml  Net -2659.16 ml    PHYSICAL EXAMINATION: General: wdwn female on bipap in NAD Neuro:  No  focal deficits, responds to commands HEENT:  NAD Cardiovascular:  RRR  Lungs:   Coarse, even respirations, non-labored on bipap Abdomen:  Soft, +BS Ext: no edema Skin:  Warm/dry  LABS:  CBC  Recent Labs Lab 04/16/14 0342 04/17/14 0343 04/18/14 0320  WBC 8.8 13.5* 10.6*  HGB 12.1 12.2 10.0*  HCT 34.4* 36.7 27.2*  PLT 277 287 210   Coag's No results found for this basename: APTT, INR,  in the last 168 hours  BMET  Recent Labs Lab 04/18/14 0539 04/18/14 1055 04/19/14 0324  NA 144 148* 151*  K 6.1* 4.9 4.2  CL 112 109 112  CO2 20 23 28   BUN 20 22 28*  CREATININE 0.73 0.75 0.90  GLUCOSE 156* 114* 124*   Electrolytes  Recent Labs Lab 04/16/14 2120  04/17/14 1253 04/17/14 2210  04/18/14 0539 04/18/14 1055 04/19/14 0324  CALCIUM  --   < >  --   --   < > 8.8 9.5 9.3  MG 2.5  --  2.3 2.4  --   --   --   --   PHOS 3.6  --  3.7 3.0  --   --   --   --   < > = values in this interval not displayed. Sepsis Markers  Recent Labs Lab 04/16/14 0908 04/17/14 0343 04/18/14 0539  PROCALCITON 1.56 1.04 0.45    ABG  Recent Labs Lab 04/15/14 1607 04/16/14  0405 04/18/14 2119  PHART 7.426 7.437 7.337*  PCO2ART 42.3 33.4* 53.9*  PO2ART 246.0* 124.0* 73.4*   Liver Enzymes  Recent Labs Lab 04/15/14 1359 04/17/14 0343  AST 30 21  ALT 23 16  ALKPHOS 112 72  BILITOT 1.1 <0.2*  ALBUMIN 3.5 2.9*   Cardiac Enzymes  Recent Labs Lab 04/15/14 1359 04/15/14 2000 04/16/14 0338  TROPONINI <0.30 <0.30 <0.30  PROBNP 26.9  --   --    Glucose  Recent Labs Lab 04/18/14 0006 04/18/14 0441 04/18/14 0717 04/18/14 1343 04/18/14 1638 04/18/14 2121  GLUCAP 135* 155* 161* 128* 142* 166*    Imaging Dg Chest Port 1 View  04/18/2014   CLINICAL DATA:  Respiratory failure.  EXAM: PORTABLE CHEST - 1 VIEW  COMPARISON:  04/18/2014 earlier.  FINDINGS: The lungs are somewhat hypoinflated without focal consolidation or effusion. Cardiomediastinal silhouette and  remainder the exam is unchanged.  IMPRESSION: Hypoinflation without acute cardiopulmonary disease.   Electronically Signed   By: Marin Olp M.D.   On: 04/18/2014 21:35   Dg Chest Port 1 View  04/18/2014   CLINICAL DATA:  Assess endotracheal and support tube placement  EXAM: PORTABLE CHEST - 1 VIEW  COMPARISON:  Portable chest x-ray of April 17, 2014  FINDINGS: The endotracheal tube tip lies 3 cm above the crotch of the carina. The esophagogastric tube tip projects off the inferior margin of the study. The lungs are reasonably well inflated. Minimal prominence of the interstitial markings on the left is noted. The cardiopericardial silhouette is mildly enlarged. The pulmonary vascularity is prominent centrally especially on the left. There is no pleural effusion. The observed bony thorax is unremarkable.  IMPRESSION: The support tube positioning is good. Mild prominence of the pulmonary interstitial markings on the left may reflect low-grade asymmetric interstitial edema. There is no evidence of pneumonia.   Electronically Signed   By: David  Martinique   On: 04/18/2014 06:56   CXR: Slight generalized haziness on L , some int prom  ASSESSMENT / PLAN:  PULMONARY ETT 9/11 >> 9/14 A: Acute Respiratory Failure Respiratory acidosis Status asthmaticus - resolved P:   Interrupt bipap assess clinical status, may require further q4h, if does not, will not require ICU Monitor ABG Supp O2 as needed Cont nebulized BDs nebulized steroids Taper systemic steroids (rapidly if possible, given agitation) Consider leukotriene inhibitor given reported hx of nasal polyps and ASA sens - once off steroids CONTROL AFTERLOAD as main issue lasix  CARDIOVASCULAR CVL A:  Severe hypertension Sinus tachycardia P:  PRN metoprolol to maintain HR < 115/min, limit wheezing for now PRN hydralazine to maintain SBP < 170 mmHg Initiate clonidine 9/14 Add hydralazine now 25 q8h  RENAL A:   Hypernatremia AKI (inc  BUN and Cr), contrast recieved P:   Monitor BMET am Monitor I/Os Encourage water intake Recheck BMET this afternoon Avoid ACEI and ARBs Reduce lasix   GASTROINTESTINAL A:   Post extubation dysphagia P:   SUP: N/I post extubation NPO Swallow study  HEMATOLOGIC A:   No acute issues P:  DVT px: SQ heparin Monitor CBC intermittently Transfuse per usual ICU guidelines ambulate  INFECTIOUS A:   No acute infectious issues identified Minimally elevated PCT (decreasing) P:   BCx2 9/11 >> NEG UA/UC 9/11 >> NEG Sputum 9/11 >> NOF HIV 9/11 >>   NEG Abx: Rocephin, start date 9/11>>>9/13 Azithro, start date 9/11 >> 9/14  ENDOCRINE A:   Hyperglycemia, steroid induced - mild P:   Taper steroids  further DC SSI Monitor glu on chem panels Resume SSI for glu > 180, even closer with d5 added  NEUROLOGIC A:   Acute Encephalopathy Severe agitation P:   RASS goal: 0 Dex initiated 9/14 Begin clonidine and taper dex off as tolerated -off PT order   TODAY'S SUMMARY: BIPAP, off precedex, abg, may go to tele?, lasix reduction.  To [facility] 9/16 am 0700   I have personally obtained a history, examined the patient, evaluated laboratory and imaging results, formulated the assessment and plan and placed orders.  CRITICAL CARE: The patient is critically ill with multiple organ systems failure and requires high complexity decision making for assessment and support, frequent evaluation and titration of therapies, application of advanced monitoring technologies and extensive interpretation of multiple databases. Critical Care Time devoted to patient care services described in this note is 30 minutes.    Magdalene River, PA-S  [facility]  04/19/2014, 8:03 AM  Lavon Paganini. Titus Mould, MD, [place] Pgr: [place]

## 2014-04-20 ENCOUNTER — Inpatient Hospital Stay (HOSPITAL_COMMUNITY): Payer: No Typology Code available for payment source

## 2014-04-20 LAB — CBC
HCT: 43.3 % (ref 36.0–46.0)
Hemoglobin: 14.4 g/dL (ref 12.0–15.0)
MCH: 27.6 pg (ref 26.0–34.0)
MCHC: 33.3 g/dL (ref 30.0–36.0)
MCV: 83.1 fL (ref 78.0–100.0)
Platelets: 330 10*3/uL (ref 150–400)
RBC: 5.21 MIL/uL — ABNORMAL HIGH (ref 3.87–5.11)
RDW: 13.9 % (ref 11.5–15.5)
WBC: 11.3 10*3/uL — AB (ref 4.0–10.5)

## 2014-04-20 LAB — BASIC METABOLIC PANEL
Anion gap: 13 (ref 5–15)
BUN: 30 mg/dL — ABNORMAL HIGH (ref 6–23)
CO2: 28 mEq/L (ref 19–32)
Calcium: 9.8 mg/dL (ref 8.4–10.5)
Chloride: 109 mEq/L (ref 96–112)
Creatinine, Ser: 0.87 mg/dL (ref 0.50–1.10)
GFR, EST AFRICAN AMERICAN: 85 mL/min — AB (ref >90)
GFR, EST NON AFRICAN AMERICAN: 74 mL/min — AB (ref >90)
Glucose, Bld: 110 mg/dL — ABNORMAL HIGH (ref 70–99)
POTASSIUM: 4.1 meq/L (ref 3.7–5.3)
Sodium: 150 mEq/L — ABNORMAL HIGH (ref 137–147)

## 2014-04-20 MED ORDER — PREDNISONE 20 MG PO TABS
40.0000 mg | ORAL_TABLET | Freq: Every day | ORAL | Status: AC
  Administered 2014-04-21 – 2014-04-23 (×3): 40 mg via ORAL
  Filled 2014-04-20 (×3): qty 2

## 2014-04-20 MED ORDER — IPRATROPIUM-ALBUTEROL 0.5-2.5 (3) MG/3ML IN SOLN
3.0000 mL | Freq: Four times a day (QID) | RESPIRATORY_TRACT | Status: DC
  Administered 2014-04-20 – 2014-04-22 (×7): 3 mL via RESPIRATORY_TRACT
  Filled 2014-04-20 (×7): qty 3

## 2014-04-20 NOTE — Progress Notes (Signed)
[facility] PROGRESS NOTE  Tammie [name] OVZ:[id number] DOB: 14-Feb-1961 DOA: 04/15/2014 PCP: No PCP Per Patient Brief hpI: 53 y/o [name] admitted to [facility] on 9/11 after being seen for cough, congestion and chest pain. Medicated with ASA & NTG. Pt developed increased SOB & Anaphylactic reaction requiring emergent intubation. PCCM called for admission  Assessment/Plan: 1. Acute respiratory failure with respiratory acidosis: -s/p intubation and extubation, on [place] oxygen at this time. Duo nebs and steroids. Lasix daily. Wean her off oxygen . wean her off the steroids.   Hypertension: Controlled   Tachycardia: resolved and prn lopressor.   Acute encephalopathy: secondary to acute respiratory failure: improving.    Dysphagia: On dys 2 diet.    DVT prophylaxis.     Code Status: full code Family Communication: family at bedside Disposition Plan: pending.    Consultants:  none  Events; 9/11 Admit with cough, congestion, chest pain, HTN , anaphylaxis with ASA (was not listed as an allergy)  9/12 trop neg  9/14 Extubated. Agitated with hypertension and tachycardia post extubation. Precedex initiated. Much improved. Clonidine ordered  9/14 sudden desat requiring bipap   Antibiotics:    HPI/Subjective: Slightly sob. Confused .  Objective: Filed Vitals:   04/20/14 1331  BP: 151/82  Pulse: 94  Temp: 97.4 F (36.3 C)  Resp: 24    Intake/Output Summary (Last 24 hours) at 04/20/14 1631 Last data filed at 04/20/14 1154  Gross per 24 hour  Intake    600 ml  Output    350 ml  Net    250 ml   Filed Weights   04/18/14 0400 04/19/14 0500 04/20/14 0519  Weight: 64.7 kg (142 lb 10.2 oz) 63.5 kg (139 lb 15.9 oz) 58.196 kg (128 lb 4.8 oz)    Exam:   General:  aler afebrile comfortable  Cardiovascular: s1s2  Respiratory: scattered rhonchi  Abdomen: soft NT ND BS+  Musculoskeletal: no pedal edema.   Data Reviewed: Basic Metabolic Panel:  Recent Labs Lab  04/16/14 0342 04/16/14 0908 04/16/14 2120  04/17/14 1253 04/17/14 2210 04/18/14 0320 04/18/14 0539 04/18/14 1055 04/19/14 0324 04/20/14 0548  NA 142  --   --   < >  --   --  121* 144 148* 151* 150*  K 2.9*  --   --   < >  --   --  4.5 6.1* 4.9 4.2 4.1  CL 108  --   --   < >  --   --  95* 112 109 112 109  CO2 22  --   --   < >  --   --  14* 20 23 28 28   GLUCOSE 155*  --   --   < >  --   --  112* 156* 114* 124* 110*  BUN 13  --   --   < >  --   --  14 20 22  28* 30*  CREATININE 1.06  --   --   < >  --   --  0.59 0.73 0.75 0.90 0.87  CALCIUM 8.3*  --   --   < >  --   --  5.7* 8.8 9.5 9.3 9.8  MG 2.2 2.2 2.5  --  2.3 2.4  --   --   --   --   --   PHOS 1.3* 2.4 3.6  --  3.7 3.0  --   --   --   --   --   < > =  values in this interval not displayed. Liver Function Tests:  Recent Labs Lab 04/15/14 1359 04/17/14 0343  AST 30 21  ALT 23 16  ALKPHOS 112 72  BILITOT 1.1 <0.2*  PROT 7.4 6.4  ALBUMIN 3.5 2.9*   No results found for this basename: LIPASE, AMYLASE,  in the last 168 hours No results found for this basename: AMMONIA,  in the last 168 hours CBC:  Recent Labs Lab 04/15/14 1359 04/16/14 0342 04/17/14 0343 04/18/14 0320 04/20/14 0548  WBC 8.7 8.8 13.5* 10.6* 11.3*  NEUTROABS 3.6  --  12.4* 9.9*  --   HGB 14.1 12.1 12.2 10.0* 14.4  HCT 40.8 34.4* 36.7 27.2* 43.3  MCV 81.6 79.3 83.0 88.6 83.1  PLT 318 277 287 210 330   Cardiac Enzymes:  Recent Labs Lab 04/15/14 1359 04/15/14 2000 04/16/14 0338  TROPONINI <0.30 <0.30 <0.30   BNP (last 3 results)  Recent Labs  04/15/14 1359  PROBNP 26.9   CBG:  Recent Labs Lab 04/18/14 0441 04/18/14 0717 04/18/14 1343 04/18/14 1638 04/18/14 2121  GLUCAP 155* 161* 128* 142* 166*    Recent Results (from the past 240 hour(s))  MRSA PCR SCREENING     Status: None   Collection Time    04/15/14  5:47 PM      Result Value Ref Range Status   MRSA by PCR NEGATIVE  NEGATIVE Final   Comment:            The GeneXpert  MRSA Assay (FDA     approved for NASAL specimens     only), is one component of a     comprehensive MRSA colonization     surveillance program. It is not     intended to diagnose MRSA     infection nor to guide or     monitor treatment for     MRSA infections.  CULTURE, BLOOD (ROUTINE X 2)     Status: None   Collection Time    04/15/14  8:00 PM      Result Value Ref Range Status   Specimen Description BLOOD RIGHT ARM   Final   Special Requests BOTTLES DRAWN AEROBIC AND ANAEROBIC 10CC   Final   Culture  Setup Time     Final   Value: 04/16/2014 02:07     Performed at [facility]   Culture     Final   Value:        BLOOD CULTURE RECEIVED NO GROWTH TO DATE CULTURE WILL BE HELD FOR 5 DAYS BEFORE ISSUING A FINAL NEGATIVE REPORT     Performed at [facility]   Report Status PENDING   Incomplete  CULTURE, BLOOD (ROUTINE X 2)     Status: None   Collection Time    04/15/14  8:10 PM      Result Value Ref Range Status   Specimen Description BLOOD RIGHT FOREARM   Final   Special Requests BOTTLES DRAWN AEROBIC AND ANAEROBIC [facility]   Final   Culture  Setup Time     Final   Value: 04/16/2014 02:06     Performed at [facility]   Culture     Final   Value:        BLOOD CULTURE RECEIVED NO GROWTH TO DATE CULTURE WILL BE HELD FOR 5 DAYS BEFORE ISSUING A FINAL NEGATIVE REPORT     Performed at [facility]   Report Status PENDING   Incomplete  CULTURE, RESPIRATORY (NON-EXPECTORATED)  Status: None   Collection Time    04/16/14  9:25 AM      Result Value Ref Range Status   Specimen Description TRACHEAL ASPIRATE   Final   Special Requests NONE   Final   Gram Stain     Final   Value: FEW WBC PRESENT,BOTH PMN AND MONONUCLEAR     RARE SQUAMOUS EPITHELIAL CELLS PRESENT     NO ORGANISMS SEEN     Performed at [facility]   Culture     Final   Value: Non-Pathogenic Oropharyngeal-type Flora Isolated.     Performed at [facility]   Report Status  04/19/2014 FINAL   Final     Studies: Dg Chest 1 View  04/20/2014   CLINICAL DATA:  Respiratory failure.  EXAM: CHEST - 1 VIEW  COMPARISON:  04/19/2014  FINDINGS: Lungs are hypoinflated without consolidation or effusion. Cardiomediastinal silhouette and remainder of the exam is unchanged.  IMPRESSION: Hypoinflation without acute cardiopulmonary disease.   Electronically Signed   By: Marin Olp M.D.   On: 04/20/2014 08:43   Dg Chest Port 1 View  04/19/2014   CLINICAL DATA:  Difficulty breathing  EXAM: PORTABLE CHEST - 1 VIEW  COMPARISON:  April 18, 2014  FINDINGS: There is no edema or consolidation. Heart is borderline enlarged with pulmonary vascularity within normal limits. No adenopathy. No bone lesions.  IMPRESSION: No edema or consolidation.  Mild cardiac prominence.   Electronically Signed   By: Lowella Grip M.D.   On: 04/19/2014 07:06   Dg Chest Port 1 View  04/18/2014   CLINICAL DATA:  Respiratory failure.  EXAM: PORTABLE CHEST - 1 VIEW  COMPARISON:  04/18/2014 earlier.  FINDINGS: The lungs are somewhat hypoinflated without focal consolidation or effusion. Cardiomediastinal silhouette and remainder the exam is unchanged.  IMPRESSION: Hypoinflation without acute cardiopulmonary disease.   Electronically Signed   By: Marin Olp M.D.   On: 04/18/2014 21:35    Scheduled Meds: . antiseptic oral rinse  7 mL Mouth Rinse BID  . bisoprolol  2.5 mg Oral Daily  . budesonide  0.25 mg Nebulization BID  . cloNIDine  0.3 mg Transdermal Weekly  . furosemide  10 mg Intravenous Daily  . heparin  5,000 Units Subcutaneous 3 times per day  . hydrALAZINE  25 mg Oral 3 times per day  . ipratropium-albuterol  3 mL Nebulization Q6H  . methylPREDNISolone (SOLU-MEDROL) injection  20 mg Intravenous Q12H   Continuous Infusions: . dextrose 30 mL/hr at 04/20/14 0600    Active Problems:   Anaphylaxis   Status asthmaticus   Hypertension   Sinus tachycardia   Acute respiratory failure with  hypercapnia   Delirium    Time spent: 35 min    [place] [facility] Pager [phone] If 7PM-7AM, please contact night-coverage at [email or web address], password [facility] 04/20/2014, 4:31 PM  LOS: 5 days

## 2014-04-20 NOTE — Evaluation (Signed)
Physical Therapy Evaluation Patient Details Name: Tammie [name] MRN: [id number] DOB: 1960/12/12 Today's Date: 04/20/2014   History of Present Illness  53 y/o [name] admitted to [facility] on 04/15/14 after being seen for cough, congestion and chest pain.  Medicated with ASA & NTG.  Pt developed increased SOB & Anaphylactic reaction requiring emergent intubation. PCCM called for admission. Pt extubated 04/18/14.  Clinical Impression  Pt admitted with above. Pt currently with functional limitations requiring PT intervention.  Min assist for transfers and gait needed at time of eval.  Pt demo decreased activity tolerance and decreased balance.  Pt will benefit from skilled PT to increase their independence and safety with mobility to allow discharge to the venue listed below. Recommending HHPT, 24-hour family assist and RW for d/c home.  Pt may progress to less restrictive A.D. Depending on LOS.      Follow Up Recommendations Home health PT;Supervision/Assistance - 24 hour    Equipment Recommendations  Rolling walker with 5" wheels    Recommendations for Other Services       Precautions / Restrictions Precautions Precautions: Fall      Mobility  Bed Mobility Overal bed mobility: Needs Assistance Bed Mobility: Supine to Sit     Supine to sit: Min assist;HOB elevated     General bed mobility comments: use of bedrails  Transfers Overall transfer level: Needs assistance Equipment used: None Transfers: Sit to/from [facility] Sit to Stand: Min assist Stand pivot transfers: Min assist       General transfer comment: assist to power up and steady balance with initial stance  Ambulation/Gait Ambulation/Gait assistance: Min assist Ambulation Distance (Feet): 75 Feet Assistive device: 1 person hand held assist Gait Pattern/deviations: Scissoring;Decreased stride length   Gait velocity interpretation: Below normal speed for age/gender    Stairs             Wheelchair Mobility    Modified Rankin (Stroke Patients Only)       Balance Overall balance assessment: Needs assistance Sitting-balance support: No upper extremity supported;Feet unsupported Sitting balance-Leahy Scale: Good     Standing balance support: Single extremity supported Standing balance-Leahy Scale: Fair                               Pertinent Vitals/Pain Pain Assessment: No/denies pain    Home Living Family/patient expects to be discharged to:: Private residence Living Arrangements: Other relatives Available Help at Discharge: Family;Available 24 hours/day Type of Home: House Home Access: Stairs to enter Entrance Stairs-Rails: Right;Left;Can reach both Entrance Stairs-Number of Steps: 2 Home Layout: One level Home Equipment: None      Prior Function Level of Independence: Independent               Hand Dominance        Extremity/Trunk Assessment   Upper Extremity Assessment: Defer to OT evaluation           Lower Extremity Assessment: Generalized weakness         Communication   Communication: No difficulties  Cognition Arousal/Alertness: Awake/alert Behavior During Therapy: Flat affect Overall Cognitive Status: Within Functional Limits for tasks assessed       Memory: Decreased recall of precautions              General Comments      Exercises        Assessment/Plan    PT Assessment Patient needs continued PT services  PT Diagnosis  Difficulty walking;Generalized weakness   PT Problem List Decreased strength;Decreased activity tolerance;Decreased balance;Decreased mobility;Decreased safety awareness;Decreased knowledge of use of DME;Decreased knowledge of precautions;Cardiopulmonary status limiting activity  PT Treatment Interventions DME instruction;Gait training;Stair training;Functional mobility training;Therapeutic activities;Therapeutic exercise;Patient/family education;Balance training   PT  Goals (Current goals can be found in the Care Plan section) Acute Rehab PT Goals Patient Stated Goal: not stated PT Goal Formulation: With patient Time For Goal Achievement: 05/04/14 Potential to Achieve Goals: Good    Frequency Min 3X/week   Barriers to discharge        Co-evaluation               End of Session Equipment Utilized During Treatment: Gait belt;Oxygen Activity Tolerance: Patient tolerated treatment well Patient left: in chair;with call bell/phone within reach;with family/visitor present;with nursing/sitter in room Nurse Communication: Mobility status         Time: 1020-1047 PT Time Calculation (min): 27 min   Charges:   PT Evaluation $Initial PT Evaluation Tier I: 1 Procedure PT Treatments $Gait Training: 8-22 mins   PT G Codes:          Lorriane Shire 04/20/2014, 11:40 AM

## 2014-04-20 NOTE — Progress Notes (Signed)
Pt HR sustaining 120's.  PRN Metoprolol administered to maintain HR <115.  Will report to day shift RN.

## 2014-04-20 NOTE — Progress Notes (Signed)
Speech Language Pathology Treatment: Dysphagia  Patient Details Name: Tammie [name] MRN: [id number] DOB: [birth year] Today's Date: 04/20/2014 Time: 8657-8469 SLP Time Calculation (min): 15 min  Assessment / Plan / Recommendation Clinical Impression  F/u after yesterday's swallow eval.  Pt continues with slowed processing, delayed motor output, but awareness and verbalizations are improved today.  Phonation remains hoarse s/p extubation.  Trials of thin liquids immediately elicited coughing, suggesting continued aspiration risk.  Pt tolerated purees, soft solids, and nectar-thick liquids with no overt s/s of aspiration and min cues for precautions.  Recommend continuing current diet.  Pt may require instrumental testing of swallow if deficits do not clear next 24-48 hours.  Will follow.   HPI HPI: 53 y/o female admitted to [facility] on 9/11 after being seen for cough, congestion and chest pain. Medicated with ASA & NTG. Pt developed increased SOB & anaphylactic reaction requiring emergent intubation; extubated 9/14.  Nursing reports pt was given water to drink,  followed by a 30 min coughing spell during which pt desaturated, bipap was initiated with good results.  Now with encephalopathy, agitation; NPO.   Pertinent Vitals Pain Assessment: No/denies pain  SLP Plan       Recommendations Diet recommendations: Dysphagia 2 (fine chop);Nectar-thick liquid Liquids provided via: Cup Medication Administration: Whole meds with puree Supervision: Full supervision/cueing for compensatory strategies Compensations: Slow rate;Small sips/bites Postural Changes and/or Swallow Maneuvers: Seated upright 90 degrees              Oral Care Recommendations: Oral care BID Follow up Recommendations: None   Tammie [name] L. Tivis Ringer, [place] CCC/SLP Pager [phone]      Juan Quam Laurice 04/20/2014, 2:28 PM

## 2014-04-21 LAB — BASIC METABOLIC PANEL
Anion gap: 12 (ref 5–15)
BUN: 29 mg/dL — ABNORMAL HIGH (ref 6–23)
CO2: 28 mEq/L (ref 19–32)
Calcium: 10 mg/dL (ref 8.4–10.5)
Chloride: 106 mEq/L (ref 96–112)
Creatinine, Ser: 0.96 mg/dL (ref 0.50–1.10)
GFR calc non Af Amer: 65 mL/min — ABNORMAL LOW (ref >90)
GFR, EST AFRICAN AMERICAN: 76 mL/min — AB (ref >90)
Glucose, Bld: 166 mg/dL — ABNORMAL HIGH (ref 70–99)
POTASSIUM: 4.2 meq/L (ref 3.7–5.3)
Sodium: 146 mEq/L (ref 137–147)

## 2014-04-21 MED ORDER — FREE WATER
200.0000 mL | Freq: Three times a day (TID) | Status: DC
  Administered 2014-04-21 – 2014-04-25 (×11): 200 mL via ORAL

## 2014-04-21 MED ORDER — CLONIDINE HCL 0.3 MG/24HR TD PTWK: 0.3000 mg | MEDICATED_PATCH | TRANSDERMAL | Status: DC

## 2014-04-21 MED ORDER — ENSURE PUDDING PO PUDG
1.0000 | Freq: Three times a day (TID) | ORAL | Status: DC
  Administered 2014-04-21 – 2014-04-25 (×9): 1 via ORAL

## 2014-04-21 NOTE — Progress Notes (Addendum)
NUTRITION FOLLOW UP  INTERVENTION: Ensure Pudding po TID, each supplement provides 170 kcal and 4 grams of protein RD to follow for nutrition care plan  NUTRITION DIAGNOSIS: Inadequate oral intake now related to encephalopathy, dysphagia as evidenced by PO intake 25-50%, ongoing  Goal: Pt to meet >/= 90% of their estimated nutrition needs, progrsesing   Monitor:  PO & supplemental intake, weight, labs, I/O's   ASSESSMENT: 53 y/o [name] admitted to [facility] on 9/11 after being seen for cough, congestion and chest pain. Medicated with ASA & NTG. Pt developed increased SOB & Anaphylactic reaction requiring emergent intubation. PCCM called for admission.  Patient extubated 9/14.  Transferred from MICU to 2W-Cardiac 9/15.  Patient s/p bedside swallow evaluation 9/15.  Presented with acute reversible dysphagia.  Diet advanced to Dys 2, nectar thick liquids.  PO intake variable at 25-50% per flowsheet records.  Would benefit from addition of oral nutrition supplement.  RD to order.  Height: Ht Readings from Last 1 Encounters:  04/15/14 5\' 2"  (1.575 m)    Weight: Wt Readings from Last 1 Encounters:  04/21/14 128 lb 15.5 oz (58.5 kg)    BMI:  Body mass index is 23.58 kg/(m^2).  Re-estimated Nutritional Needs: Kcal: 1450-1650 Protein: 65-75 gm Fluid: per MD  Skin: Intact  Diet Order: Dysphagia 2, nectar thick liquids   Intake/Output Summary (Last 24 hours) at 04/21/14 1205 Last data filed at 04/20/14 2339  Gross per 24 hour  Intake    240 ml  Output    475 ml  Net   -235 ml    Labs:   Recent Labs Lab 04/16/14 2120  04/17/14 1253 04/17/14 2210  04/18/14 1055 04/19/14 0324 04/20/14 0548  NA  --   < >  --   --   < > 148* 151* 150*  K  --   < >  --   --   < > 4.9 4.2 4.1  CL  --   < >  --   --   < > 109 112 109  CO2  --   < >  --   --   < > 23 28 28   BUN  --   < >  --   --   < > 22 28* 30*  CREATININE  --   < >  --   --   < > 0.75 0.90 0.87  CALCIUM  --   < >  --   --    < > 9.5 9.3 9.8  MG 2.5  --  2.3 2.4  --   --   --   --   PHOS 3.6  --  3.7 3.0  --   --   --   --   GLUCOSE  --   < >  --   --   < > 114* 124* 110*  < > = values in this interval not displayed.  CBG (last 3)   Recent Labs  04/18/14 1343 04/18/14 1638 04/18/14 2121  GLUCAP 128* 142* 166*    Scheduled Meds: . antiseptic oral rinse  7 mL Mouth Rinse BID  . bisoprolol  2.5 mg Oral Daily  . budesonide  0.25 mg Nebulization BID  . cloNIDine  0.3 mg Transdermal Weekly  . furosemide  10 mg Intravenous Daily  . heparin  5,000 Units Subcutaneous 3 times per day  . hydrALAZINE  25 mg Oral 3 times per day  . ipratropium-albuterol  3 mL Nebulization Q6H  .  predniSONE  40 mg Oral QAC breakfast    Continuous Infusions:    Past Medical History  Diagnosis Date  . Hypertension   . Environmental allergies   . Asthma   . Shortness of breath   . Respiratory failure, acute 04/19/2014  . Anxiety   . GERD (gastroesophageal reflux disease)   . ZRAQTMAU(633.3)     Past Surgical History  Procedure Laterality Date  . Abdominal hysterectomy      Arthur Holms, RD, LDN Pager #: [phone] After-Hours Pager #: [phone]

## 2014-04-21 NOTE — Care Management Note (Unsigned)
    Page 1 of 2   04/22/2014     4:34:42 PM CARE MANAGEMENT NOTE 04/22/2014  Patient:  Tammie [name], Tammie [name]   Account Number:  [id number]  Date Initiated:  04/21/2014  Documentation initiated by:  Sandeep Radell  Subjective/Objective Assessment:   Pt adm on 04/15/14 with CP, anaphylaxis.  PTA, pt independent, lives alone.     Action/Plan:   PT recommending HH with 24hr supervision at dc.  Met with pt and sister to discuss dc plans.   Anticipated DC Date:  04/23/2014   Anticipated DC Plan:  SKILLED NURSING FACILITY  In-house referral  Clinical Social Worker      DC Planning Services  CM consult      Choice offered to / List presented to:             Status of service:  In process, will continue to follow Medicare Important Message given?   (If response is "NO", the following Medicare IM given date fields will be blank) Date Medicare IM given:   Medicare IM given by:   Date Additional Medicare IM given:   Additional Medicare IM given by:    Discharge Disposition:    Per UR Regulation:  Reviewed for med. necessity/level of care/duration of stay  If discussed at [place] of Stay Meetings, dates discussed:    Comments:  04/22/14 Ellan Lambert, RN, BSN [phone] Notified by [facility] agency that pt will have copay with each visit for [facility], PT, and OT of $35.  Pt very concerned about costs of [place] follow up, and will not be able to afford this. Pt could be enrolled in indigent program for [facility], but then would not qualify for needed therapies.  Pt may need SNF in order to maximize insurance benefits and get the most therapy.  MD spoke with pt about this, and she is agreeable to going to SNF for rehab.  Will consult CSW to facilitate dc to SNF for rehab when medically stable.   04/21/14 Ellan Lambert, RN, BSN [phone] Sister states she wants pt to stay with her at dc, and that she can provide 24h care until pt is able to return home independently.  Pt is agreeable to this plan.  Pt given [place]  list of [place] providers; wants to use [facility] for [facility] needs.  Referral to [facility], per pt choice.  Start of care 24-48h post dc date.  Will need RW for home.  Sister:  [place], [place] [address] phone: [phone]

## 2014-04-21 NOTE — Progress Notes (Addendum)
Physical Therapy Treatment Patient Details Name: Tammie [name] MRN: [id number] DOB: [birth year] Today's Date: 04/21/2014    History of Present Illness 53 y/o [name] admitted to [facility] on 04/15/14 after being seen for cough, congestion and chest pain.  Medicated with ASA & NTG.  Pt developed increased SOB & Anaphylactic reaction requiring emergent intubation. PCCM called for admission. Pt extubated 04/18/14.Pt with significant PMhx of HTN, asthma, SOB, anxiety, and HA.      PT Comments    Pt is progressing well with her mobility, but continues to present as a significant risk for falls.  Will bring RW next session and see if she can stabilize herself and safely use this assistive device.  PT will continue to follow acutely and she continues to be appropriate for HHPT as long as 24/7 assistance can be confirmed with her family.    Follow Up Recommendations  Home health PT;Supervision/Assistance - 24 hour     Equipment Recommendations  Rolling walker with 5" wheels    Recommendations for Other Services   NA     Precautions / Restrictions Precautions Precautions: Fall Precaution Comments: she is very unsteady on her feet.  Restrictions Weight Bearing Restrictions: No    Mobility  Bed Mobility Overal bed mobility: Needs Assistance Bed Mobility: Supine to Sit     Supine to sit: Supervision     General bed mobility comments: supervision for safety.  Pt able to get to long sitting in the bed on her own with the  use of the railings.   Transfers Overall transfer level: Needs assistance Equipment used: 1 person hand held assist Transfers: Sit to/from Stand Sit to Stand: Min assist         General transfer comment: Min assist to support trunk for balance during transitions from the bed and from the toilet.   Ambulation/Gait Ambulation/Gait assistance: Min assist Ambulation Distance (Feet): 85 Feet Assistive device: 1 person hand held assist Gait Pattern/deviations:  Scissoring;Staggering left;Staggering right Gait velocity: decreased Gait velocity interpretation: Below normal speed for age/gender General Gait Details: Pt with staggering, near scissoring gait pattern.  Support needed at truk for balance and with fatigue at trunk due to LE buckling.  She would likely do well with a RW.           Balance Overall balance assessment: Needs assistance Sitting-balance support: Feet supported;No upper extremity supported Sitting balance-Leahy Scale: Good     Standing balance support: Single extremity supported;Bilateral upper extremity supported;During functional activity Standing balance-Leahy Scale: Fair Standing balance comment: Statically she is ok, cannot tolerate any dynamic tasks.                     Cognition Arousal/Alertness: Awake/alert Behavior During Therapy: Impulsive (mildly) Overall Cognitive Status: Impaired/Different from baseline Area of Impairment: Safety/judgement;Awareness;Problem solving     Memory: Decreased short-term memory   Safety/Judgement: Decreased awareness of safety;Decreased awareness of deficits Awareness: Emergent Problem Solving: Difficulty sequencing;Requires verbal cues;Requires tactile cues General Comments: "I'm not retarded" pt reports at the end of the session, also seemed a bit hyper emotional throughout, leaning her head on my shoulder as we walked.       04/21/14 1031  General Exercises - Upper Extremity  Shoulder Flexion AROM;Both;10 reps;Seated  Elbow Flexion AROM;10 reps;Both;Seated    04/21/14 1031  General Exercises - Lower Extremity  Long Arc Quad AROM;Both;10 reps;Seated  Hip ABduction/ADduction AROM;Both;10 reps;Seated  Hip Flexion/Marching AROM;Both;10 reps;Seated  Toe Raises AROM;Both;10 reps;Seated  Heel Raises AROM;Both;10 reps;Seated  Pertinent Vitals/Pain Pain Assessment: No/denies pain     PT Goals (current goals can now be found in the care plan section)  Acute Rehab PT Goals Patient Stated Goal: to go home Progress towards PT goals: Progressing toward goals    Frequency  Min 3X/week    PT Plan Current plan remains appropriate       End of Session Equipment Utilized During Treatment: Gait belt Activity Tolerance: Patient limited by fatigue Patient left: in chair;with call bell/phone within reach;Other (comment) (with RN tech standing in for sitter)     Time: 1010-1027 PT Time Calculation (min): 17 min  Charges:  $Gait Training: 8-22 mins                      Aztlan Coll B. [place], [place], DPT [phone]   04/21/2014, 10:38 AM

## 2014-04-21 NOTE — Progress Notes (Signed)
[facility] PROGRESS NOTE  Tammie [name] GQQ:[id number] DOB: 1960-09-26 DOA: 04/15/2014 PCP: No PCP Per Patient Brief hpI: 53 y/o [name] admitted to [facility] on 9/11 after being seen for cough, congestion and chest pain. Medicated with ASA & NTG. Pt developed increased SOB & Anaphylactic reaction requiring emergent intubation. PCCM called for admission  Assessment/Plan: Acute respiratory failure with respiratory acidosis: -s/p intubation and extubation, on  oxygen at this time. Duo nebs and steroids. Lasix daily. Wean her off oxygen . wean her off the steroids.   Hypertension: Controlled   Tachycardia: resolved and prn lopressor.   Acute encephalopathy: secondary to acute respiratory failure: improving.    Dysphagia: On dys 2 diet.    DVT prophylaxis.     Code Status: full code Family Communication: family at bedside Disposition Plan: pending.    Consultants:  none  Events; 9/11 Admit with cough, congestion, chest pain, HTN , anaphylaxis with ASA (was not listed as an allergy)  9/12 trop neg  9/14 Extubated. Agitated with hypertension and tachycardia post extubation. Precedex initiated. Much improved. Clonidine ordered  9/14 sudden desat requiring bipap   Antibiotics:    HPI/Subjective: No chest pain. No sob.   Objective: Filed Vitals:   04/21/14 1418  BP: 116/71  Pulse: 87  Temp: 98.2 F (36.8 C)  Resp: 18    Intake/Output Summary (Last 24 hours) at 04/21/14 1704 Last data filed at 04/20/14 2339  Gross per 24 hour  Intake      0 ml  Output    225 ml  Net   -225 ml   Filed Weights   04/19/14 0500 04/20/14 0519 04/21/14 0358  Weight: 63.5 kg (139 lb 15.9 oz) 58.196 kg (128 lb 4.8 oz) 58.5 kg (128 lb 15.5 oz)    Exam:   General:  aler afebrile comfortable  Cardiovascular: s1s2  Respiratory: scattered rhonchi  Abdomen: soft NT ND BS+  Musculoskeletal: no pedal edema.   Data Reviewed: Basic Metabolic Panel:  Recent Labs Lab  04/16/14 0342 04/16/14 0908 04/16/14 2120  04/17/14 1253 04/17/14 2210  04/18/14 0539 04/18/14 1055 04/19/14 0324 04/20/14 0548 04/21/14 1510  NA 142  --   --   < >  --   --   < > 144 148* 151* 150* 146  K 2.9*  --   --   < >  --   --   < > 6.1* 4.9 4.2 4.1 4.2  CL 108  --   --   < >  --   --   < > 112 109 112 109 106  CO2 22  --   --   < >  --   --   < > 20 23 28 28 28   GLUCOSE 155*  --   --   < >  --   --   < > 156* 114* 124* 110* 166*  BUN 13  --   --   < >  --   --   < > 20 22 28* 30* 29*  CREATININE 1.06  --   --   < >  --   --   < > 0.73 0.75 0.90 0.87 0.96  CALCIUM 8.3*  --   --   < >  --   --   < > 8.8 9.5 9.3 9.8 10.0  MG 2.2 2.2 2.5  --  2.3 2.4  --   --   --   --   --   --  PHOS 1.3* 2.4 3.6  --  3.7 3.0  --   --   --   --   --   --   < > = values in this interval not displayed. Liver Function Tests:  Recent Labs Lab 04/15/14 1359 04/17/14 0343  AST 30 21  ALT 23 16  ALKPHOS 112 72  BILITOT 1.1 <0.2*  PROT 7.4 6.4  ALBUMIN 3.5 2.9*   No results found for this basename: LIPASE, AMYLASE,  in the last 168 hours No results found for this basename: AMMONIA,  in the last 168 hours CBC:  Recent Labs Lab 04/15/14 1359 04/16/14 0342 04/17/14 0343 04/18/14 0320 04/20/14 0548  WBC 8.7 8.8 13.5* 10.6* 11.3*  NEUTROABS 3.6  --  12.4* 9.9*  --   HGB 14.1 12.1 12.2 10.0* 14.4  HCT 40.8 34.4* 36.7 27.2* 43.3  MCV 81.6 79.3 83.0 88.6 83.1  PLT 318 277 287 210 330   Cardiac Enzymes:  Recent Labs Lab 04/15/14 1359 04/15/14 2000 04/16/14 0338  TROPONINI <0.30 <0.30 <0.30   BNP (last 3 results)  Recent Labs  04/15/14 1359  PROBNP 26.9   CBG:  Recent Labs Lab 04/18/14 0441 04/18/14 0717 04/18/14 1343 04/18/14 1638 04/18/14 2121  GLUCAP 155* 161* 128* 142* 166*    Recent Results (from the past 240 hour(s))  MRSA PCR SCREENING     Status: None   Collection Time    04/15/14  5:47 PM      Result Value Ref Range Status   MRSA by PCR NEGATIVE   NEGATIVE Final   Comment:            The GeneXpert MRSA Assay (FDA     approved for NASAL specimens     only), is one component of a     comprehensive MRSA colonization     surveillance program. It is not     intended to diagnose MRSA     infection nor to guide or     monitor treatment for     MRSA infections.  CULTURE, BLOOD (ROUTINE X 2)     Status: None   Collection Time    04/15/14  8:00 PM      Result Value Ref Range Status   Specimen Description BLOOD RIGHT ARM   Final   Special Requests BOTTLES DRAWN AEROBIC AND ANAEROBIC 10CC   Final   Culture  Setup Time     Final   Value: 04/16/2014 02:07     Performed at [facility]   Culture     Final   Value:        BLOOD CULTURE RECEIVED NO GROWTH TO DATE CULTURE WILL BE HELD FOR 5 DAYS BEFORE ISSUING A FINAL NEGATIVE REPORT     Performed at [facility]   Report Status PENDING   Incomplete  CULTURE, BLOOD (ROUTINE X 2)     Status: None   Collection Time    04/15/14  8:10 PM      Result Value Ref Range Status   Specimen Description BLOOD RIGHT FOREARM   Final   Special Requests BOTTLES DRAWN AEROBIC AND ANAEROBIC [facility]   Final   Culture  Setup Time     Final   Value: 04/16/2014 02:06     Performed at [facility]   Culture     Final   Value:        BLOOD CULTURE RECEIVED NO GROWTH TO DATE CULTURE WILL BE HELD  FOR 5 DAYS BEFORE ISSUING A FINAL NEGATIVE REPORT     Performed at [facility]   Report Status PENDING   Incomplete  CULTURE, RESPIRATORY (NON-EXPECTORATED)     Status: None   Collection Time    04/16/14  9:25 AM      Result Value Ref Range Status   Specimen Description TRACHEAL ASPIRATE   Final   Special Requests NONE   Final   Gram Stain     Final   Value: FEW WBC PRESENT,BOTH PMN AND MONONUCLEAR     RARE SQUAMOUS EPITHELIAL CELLS PRESENT     NO ORGANISMS SEEN     Performed at [facility]   Culture     Final   Value: Non-Pathogenic Oropharyngeal-type Flora Isolated.      Performed at [facility]   Report Status 04/19/2014 FINAL   Final     Studies: Dg Chest 1 View  04/20/2014   CLINICAL DATA:  Respiratory failure.  EXAM: CHEST - 1 VIEW  COMPARISON:  04/19/2014  FINDINGS: Lungs are hypoinflated without consolidation or effusion. Cardiomediastinal silhouette and remainder of the exam is unchanged.  IMPRESSION: Hypoinflation without acute cardiopulmonary disease.   Electronically Signed   By: Marin Olp M.D.   On: 04/20/2014 08:43    Scheduled Meds: . antiseptic oral rinse  7 mL Mouth Rinse BID  . bisoprolol  2.5 mg Oral Daily  . budesonide  0.25 mg Nebulization BID  . cloNIDine  0.3 mg Transdermal Weekly  . feeding supplement (ENSURE)  1 Container Oral TID BM  . free water  200 mL Oral 3 times per day  . furosemide  10 mg Intravenous Daily  . heparin  5,000 Units Subcutaneous 3 times per day  . hydrALAZINE  25 mg Oral 3 times per day  . ipratropium-albuterol  3 mL Nebulization Q6H  . predniSONE  40 mg Oral QAC breakfast   Continuous Infusions:    Active Problems:   Anaphylaxis   Status asthmaticus   Hypertension   Sinus tachycardia   Acute respiratory failure with hypercapnia   Delirium    Time spent: 35 min    [facility] Pager [phone] If 7PM-7AM, please contact night-coverage at [email or web address], password [facility] 04/21/2014, 5:04 PM  LOS: 6 days

## 2014-04-22 ENCOUNTER — Inpatient Hospital Stay (HOSPITAL_COMMUNITY): Payer: No Typology Code available for payment source

## 2014-04-22 LAB — BASIC METABOLIC PANEL
ANION GAP: 14 (ref 5–15)
BUN: 24 mg/dL — ABNORMAL HIGH (ref 6–23)
CHLORIDE: 101 meq/L (ref 96–112)
CO2: 28 meq/L (ref 19–32)
Calcium: 9.3 mg/dL (ref 8.4–10.5)
Creatinine, Ser: 0.91 mg/dL (ref 0.50–1.10)
GFR calc Af Amer: 81 mL/min — ABNORMAL LOW (ref >90)
GFR calc non Af Amer: 70 mL/min — ABNORMAL LOW (ref >90)
Glucose, Bld: 100 mg/dL — ABNORMAL HIGH (ref 70–99)
POTASSIUM: 3.8 meq/L (ref 3.7–5.3)
SODIUM: 143 meq/L (ref 137–147)

## 2014-04-22 LAB — CULTURE, BLOOD (ROUTINE X 2)
CULTURE: NO GROWTH
Culture: NO GROWTH

## 2014-04-22 LAB — CBC
HCT: 44 % (ref 36.0–46.0)
Hemoglobin: 14.6 g/dL (ref 12.0–15.0)
MCH: 27.5 pg (ref 26.0–34.0)
MCHC: 33.2 g/dL (ref 30.0–36.0)
MCV: 82.9 fL (ref 78.0–100.0)
PLATELETS: 257 10*3/uL (ref 150–400)
RBC: 5.31 MIL/uL — AB (ref 3.87–5.11)
RDW: 13.2 % (ref 11.5–15.5)
WBC: 9.5 10*3/uL (ref 4.0–10.5)

## 2014-04-22 MED ORDER — IPRATROPIUM-ALBUTEROL 0.5-2.5 (3) MG/3ML IN SOLN
3.0000 mL | Freq: Two times a day (BID) | RESPIRATORY_TRACT | Status: DC
  Administered 2014-04-22 – 2014-04-25 (×6): 3 mL via RESPIRATORY_TRACT
  Filled 2014-04-22 (×7): qty 3

## 2014-04-22 MED ORDER — HYDRALAZINE HCL 25 MG PO TABS: 25.0000 mg | ORAL_TABLET | Freq: Three times a day (TID) | ORAL | Status: DC

## 2014-04-22 MED ORDER — BISOPROLOL FUMARATE 5 MG PO TABS: 2.5000 mg | ORAL_TABLET | Freq: Every day | ORAL | Status: DC

## 2014-04-22 NOTE — Progress Notes (Signed)
[facility] PROGRESS NOTE  Tammie [name] LTJ:[id number] DOB: 05/12/1961 DOA: 04/15/2014 PCP: No PCP Per Patient Brief hpI: 53 y/o [name] admitted to [facility] on 9/11 after being seen for cough, congestion and chest pain. Medicated with ASA & NTG. Pt developed increased SOB & Anaphylactic reaction requiring emergent intubation. PCCM called for admission  Assessment/Plan: Acute respiratory failure with respiratory acidosis: -s/p intubation and extubation, on [place] oxygen at this time. Duo nebs and steroids. Lasix daily. Wean her off oxygen . wean her off the steroids.   Hypertension: Controlled   Tachycardia: resolved and prn lopressor.   Acute encephalopathy: secondary to acute respiratory failure: improving.    Dysphagia: On dys 2 diet. Unclear why she has dysphagia and generalized weakness and confusion intermittently. Will order MRI of the brain.    DVT prophylaxis.     Code Status: full code Family Communication: family at bedside Disposition Plan: pending.    Consultants:  none  Events; 9/11 Admit with cough, congestion, chest pain, HTN , anaphylaxis with ASA (was not listed as an allergy)  9/12 trop neg  9/14 Extubated. Agitated with hypertension and tachycardia post extubation. Precedex initiated. Much improved. Clonidine ordered  9/14 sudden desat requiring bipap   Antibiotics:    HPI/Subjective: No chest pain. No sob.   Objective: Filed Vitals:   04/22/14 1300  BP: 132/92  Pulse: 85  Temp: 98.7 F (37.1 C)  Resp: 18    Intake/Output Summary (Last 24 hours) at 04/22/14 1626 Last data filed at 04/22/14 1300  Gross per 24 hour  Intake    560 ml  Output      0 ml  Net    560 ml   Filed Weights   04/20/14 0519 04/21/14 0358 04/22/14 0354  Weight: 58.196 kg (128 lb 4.8 oz) 58.5 kg (128 lb 15.5 oz) 58.3 kg (128 lb 8.5 oz)    Exam:   General:  aler afebrile comfortable  Cardiovascular: s1s2  Respiratory: scattered rhonchi  Abdomen: soft NT ND  BS+  Musculoskeletal: no pedal edema.   Data Reviewed: Basic Metabolic Panel:  Recent Labs Lab 04/16/14 0342 04/16/14 0908 04/16/14 2120  04/17/14 1253 04/17/14 2210  04/18/14 1055 04/19/14 0324 04/20/14 0548 04/21/14 1510 04/22/14 0342  NA 142  --   --   < >  --   --   < > 148* 151* 150* 146 143  K 2.9*  --   --   < >  --   --   < > 4.9 4.2 4.1 4.2 3.8  CL 108  --   --   < >  --   --   < > 109 112 109 106 101  CO2 22  --   --   < >  --   --   < > 23 28 28 28 28   GLUCOSE 155*  --   --   < >  --   --   < > 114* 124* 110* 166* 100*  BUN 13  --   --   < >  --   --   < > 22 28* 30* 29* 24*  CREATININE 1.06  --   --   < >  --   --   < > 0.75 0.90 0.87 0.96 0.91  CALCIUM 8.3*  --   --   < >  --   --   < > 9.5 9.3 9.8 10.0 9.3  MG 2.2 2.2 2.5  --  2.3 2.4  --   --   --   --   --   --   PHOS 1.3* 2.4 3.6  --  3.7 3.0  --   --   --   --   --   --   < > = values in this interval not displayed. Liver Function Tests:  Recent Labs Lab 04/17/14 0343  AST 21  ALT 16  ALKPHOS 72  BILITOT <0.2*  PROT 6.4  ALBUMIN 2.9*   No results found for this basename: LIPASE, AMYLASE,  in the last 168 hours No results found for this basename: AMMONIA,  in the last 168 hours CBC:  Recent Labs Lab 04/16/14 0342 04/17/14 0343 04/18/14 0320 04/20/14 0548 04/22/14 0342  WBC 8.8 13.5* 10.6* 11.3* 9.5  NEUTROABS  --  12.4* 9.9*  --   --   HGB 12.1 12.2 10.0* 14.4 14.6  HCT 34.4* 36.7 27.2* 43.3 44.0  MCV 79.3 83.0 88.6 83.1 82.9  PLT 277 287 210 330 257   Cardiac Enzymes:  Recent Labs Lab 04/15/14 2000 04/16/14 0338  TROPONINI <0.30 <0.30   BNP (last 3 results)  Recent Labs  04/15/14 1359  PROBNP 26.9   CBG:  Recent Labs Lab 04/18/14 0441 04/18/14 0717 04/18/14 1343 04/18/14 1638 04/18/14 2121  GLUCAP 155* 161* 128* 142* 166*    Recent Results (from the past 240 hour(s))  MRSA PCR SCREENING     Status: None   Collection Time    04/15/14  5:47 PM      Result Value  Ref Range Status   MRSA by PCR NEGATIVE  NEGATIVE Final   Comment:            The GeneXpert MRSA Assay (FDA     approved for NASAL specimens     only), is one component of a     comprehensive MRSA colonization     surveillance program. It is not     intended to diagnose MRSA     infection nor to guide or     monitor treatment for     MRSA infections.  CULTURE, BLOOD (ROUTINE X 2)     Status: None   Collection Time    04/15/14  8:00 PM      Result Value Ref Range Status   Specimen Description BLOOD RIGHT ARM   Final   Special Requests BOTTLES DRAWN AEROBIC AND ANAEROBIC 10CC   Final   Culture  Setup Time     Final   Value: 04/16/2014 02:07     Performed at [facility]   Culture     Final   Value: NO GROWTH 5 DAYS     Performed at [facility]   Report Status 04/22/2014 FINAL   Final  CULTURE, BLOOD (ROUTINE X 2)     Status: None   Collection Time    04/15/14  8:10 PM      Result Value Ref Range Status   Specimen Description BLOOD RIGHT FOREARM   Final   Special Requests BOTTLES DRAWN AEROBIC AND ANAEROBIC [facility]   Final   Culture  Setup Time     Final   Value: 04/16/2014 02:06     Performed at [facility]   Culture     Final   Value: NO GROWTH 5 DAYS     Performed at [facility]   Report Status 04/22/2014 FINAL   Final  CULTURE, RESPIRATORY (NON-EXPECTORATED)  Status: None   Collection Time    04/16/14  9:25 AM      Result Value Ref Range Status   Specimen Description TRACHEAL ASPIRATE   Final   Special Requests NONE   Final   Gram Stain     Final   Value: FEW WBC PRESENT,BOTH PMN AND MONONUCLEAR     RARE SQUAMOUS EPITHELIAL CELLS PRESENT     NO ORGANISMS SEEN     Performed at [facility]   Culture     Final   Value: Non-Pathogenic Oropharyngeal-type Flora Isolated.     Performed at [facility]   Report Status 04/19/2014 FINAL   Final     Studies: No results found.  Scheduled Meds: . antiseptic oral rinse   7 mL Mouth Rinse BID  . bisoprolol  2.5 mg Oral Daily  . budesonide  0.25 mg Nebulization BID  . cloNIDine  0.3 mg Transdermal Weekly  . feeding supplement (ENSURE)  1 Container Oral TID BM  . free water  200 mL Oral 3 times per day  . furosemide  10 mg Intravenous Daily  . heparin  5,000 Units Subcutaneous 3 times per day  . hydrALAZINE  25 mg Oral 3 times per day  . ipratropium-albuterol  3 mL Nebulization BID  . predniSONE  40 mg Oral QAC breakfast   Continuous Infusions:    Active Problems:   Anaphylaxis   Status asthmaticus   Hypertension   Sinus tachycardia   Acute respiratory failure with hypercapnia   Delirium    Time spent: 35 min    [facility] Pager [phone] If 7PM-7AM, please contact night-coverage at [email or web address], password [facility] 04/22/2014, 4:26 PM  LOS: 7 days

## 2014-04-22 NOTE — Clinical Social Work Placement (Addendum)
Clinical Social Work Department CLINICAL SOCIAL WORK PLACEMENT NOTE 04/22/2014  Patient:  Tammie [name], Tammie [name]  Account Number:  [id number] Admit date:  04/15/2014  Clinical Social Worker:  Delrae Sawyers  Date/time:  04/22/2014 01:12 PM  Clinical Social Work is seeking post-discharge placement for this patient at the following level of care:   [place]   (*CSW will update this form in Epic as items are completed)   04/22/2014  Patient/family provided with [facility] Department of Clinical Social Work's list of facilities offering this level of care within the geographic area requested by the patient (or if unable, by the patient's family).  04/22/2014  Patient/family informed of their freedom to choose among providers that offer the needed level of care, that participate in Medicare, Medicaid or managed care program needed by the patient, have an available bed and are willing to accept the patient.  04/22/2014  Patient/family informed of [facility]' ownership interest in [facility], as well as of the fact that they are under no obligation to receive care at this facility.  PASARR submitted to EDS on 04/22/2014 PASARR number received on 04/22/2014  FL2 transmitted to all facilities in geographic area requested by pt/family on  04/22/2014 FL2 transmitted to all facilities within larger geographic area on   Patient informed that his/her managed care company has contracts with or will negotiate with  certain facilities, including the following:     Patient/family informed of bed offers received:  04/25/2014 Patient chooses bed at [facility] Physician recommends and patient chooses bed at    Patient to be transferred to 04/25/2014 on  [facility] Patient to be transferred to facility by [place] Patient and family notified of transfer on 04/25/2014 Name of family member notified:  Staci Righter  The following physician  request were entered in Epic:   Additional Comments:  Lubertha Sayres, Latanya Presser ([phone]) Licensed Clinical Social Worker Neuroscience [phone]) and Medical ICU (21M)

## 2014-04-22 NOTE — Progress Notes (Signed)
Notified SW that pt's sisters were visiting and wished to speak with SW regarding SNF placement.

## 2014-04-22 NOTE — Clinical Social Work Note (Signed)
CSW confirmed with pt's RN, pt's sitter has been discontinued as of 04/22/2014.  Tammie [name], [place] ([phone]) Licensed Clinical Social Worker Neuroscience [phone]) and Medical ICU (31M)

## 2014-04-22 NOTE — Progress Notes (Signed)
Speech Language Pathology Treatment: Dysphagia  Patient Details Name: Tammie [name] MRN: [id number] DOB: [birth year] Today's Date: 04/22/2014 Time: 1829-9371 SLP Time Calculation (min): 17 min  Assessment / Plan / Recommendation Clinical Impression  Trials thin liquid consumed for diagnostic treatment and ability to upgrade to thin liquids.  Poor airway protection persists with delayed and immediate cough.  Pt. reported difficulty masticating the Dys 2 textures and wants to downgrade to puree (Dys 1).  Nectar liquids appeared to continue to mitigate aspiration risk.  MD present who stated intake has been poor.  Minimal verbal reminders for small sips provided.  Hopeful for increased intake with puree textures.  Agree with MBS to fully assess swallow function as ST schedule allows (9/19 ?).   HPI HPI: 53 y/o female admitted to [facility] on 9/11 after being seen for cough, congestion and chest pain. Medicated with ASA & NTG. Pt developed increased SOB & anaphylactic reaction requiring emergent intubation; extubated 9/14.  Nursing reports pt was given water to drink,  followed by a 30 min coughing spell during which pt desaturated, bipap was initiated with good results.  Now with encephalopathy, agitation; NPO.   Pertinent Vitals Pain Assessment: No/denies pain  SLP Plan  Continue with current plan of care    Recommendations Diet recommendations: Nectar-thick liquid;Dysphagia 1 (puree) Liquids provided via: Cup;No straw Medication Administration: Whole meds with puree Supervision: Full supervision/cueing for compensatory strategies;Patient able to self feed Compensations: Slow rate;Small sips/bites Postural Changes and/or Swallow Maneuvers: Seated upright 90 degrees              Oral Care Recommendations: Oral care BID Follow up Recommendations: Skilled Nursing facility Plan: Continue with current plan of care    GO     Houston Siren 04/22/2014, 11:01 AM  Orbie Pyo Colvin Caroli.Ed  [employer] [phone]

## 2014-04-22 NOTE — Progress Notes (Signed)
Physical Therapy Treatment Patient Details Name: Tammie [name] MRN: [id number] DOB: [birth year] Today's Date: 04/22/2014    History of Present Illness 53 y/o [name] admitted to [facility] on 04/15/14 after being seen for cough, congestion and chest pain.  Medicated with ASA & NTG.  Pt developed increased SOB & Anaphylactic reaction requiring emergent intubation. PCCM called for admission. Pt extubated 04/18/14.Pt with significant PMhx of HTN, asthma, SOB, anxiety, and HA.      PT Comments    Pt continues to have unsteady gait and not able to have support at home she will need currently. Feel she would benefit from ST-SNF prior to return home.  Follow Up Recommendations  SNF     Equipment Recommendations  Rolling walker with 5" wheels    Recommendations for Other Services       Precautions / Restrictions Precautions Precautions: Fall Restrictions Weight Bearing Restrictions: No    Mobility  Bed Mobility Overal bed mobility: Needs Assistance Bed Mobility: Supine to Sit     Supine to sit: Supervision     General bed mobility comments: Verbal cues to pivot legs off bed after coming to long sitting.  Transfers Overall transfer level: Needs assistance Equipment used: Rolling walker (2 wheeled) Transfers: Sit to/from Stand Sit to Stand: Min assist         General transfer comment: Assist for balance.  Ambulation/Gait Ambulation/Gait assistance: Min assist Ambulation Distance (Feet): 150 Feet Assistive device: Rolling walker (2 wheeled) Gait Pattern/deviations: Step-through pattern;Decreased step length - right;Decreased step length - left Gait velocity: decreased Gait velocity interpretation: Below normal speed for age/gender General Gait Details: assist to steer walker. Verbal cues for hand placement on walker and monitoring for fatigue (pt bending over propping forearms on walker).   Stairs            Wheelchair Mobility    Modified Rankin (Stroke Patients Only)        Balance   Sitting-balance support: No upper extremity supported;Feet supported Sitting balance-Leahy Scale: Good     Standing balance support: No upper extremity supported Standing balance-Leahy Scale: Fair Standing balance comment: Static standing only without support                    Cognition Arousal/Alertness: Awake/alert Behavior During Therapy: WFL for tasks assessed/performed Overall Cognitive Status: No family/caregiver present to determine baseline cognitive functioning                      Exercises      General Comments        Pertinent Vitals/Pain Pain Assessment: No/denies pain    Home Living                      Prior Function            PT Goals (current goals can now be found in the care plan section) Progress towards PT goals: Progressing toward goals    Frequency  Min 3X/week    PT Plan Discharge plan needs to be updated    Co-evaluation             End of Session Equipment Utilized During Treatment: Gait belt Activity Tolerance: Patient tolerated treatment well Patient left: in chair;with call bell/phone within reach;with nursing/sitter in room     Time: 1035-1050 PT Time Calculation (min): 15 min  Charges:  $Gait Training: 8-22 mins  G Codes:      Tammie [name] 04/22/2014, 11:04 AM  Suanne Marker PT [phone]

## 2014-04-22 NOTE — Clinical Social Work Psychosocial (Signed)
Clinical Social Work Department BRIEF PSYCHOSOCIAL ASSESSMENT 04/22/2014  Patient:  Tammie [name], Tammie [name]     Account Number:  [id number]     Admit date:  04/15/2014  Clinical Social Worker:  Delrae Sawyers  Date/Time:  04/22/2014 11:33 AM  Referred by:  Physician  Date Referred:  04/22/2014 Referred for  SNF Placement   Other Referral:   none.   Interview type:  Patient Other interview type:   none.    PSYCHOSOCIAL DATA Living Status:  ALONE Admitted from facility:   Level of care:   Primary support name:  Tammie [name] Primary support relationship to patient:  [place] Degree of support available:   Adequate support system.    CURRENT CONCERNS Current Concerns  Post-Acute Placement   Other Concerns:   none.    SOCIAL WORK ASSESSMENT / PLAN CSW received consult regarding SNF placement for pt at time of discharge. Per covering CSW, pt was to be discharged home with home health services, but PT re-assessed and pt now requiring SNF placement.    CSW met with pt at bedside to discuss discharge disposition. Pt stated she was agreeable to SNF placement at time of discharge. Pt expressed she had no preference to SNF placement. CSW to continue to follow and assist with discharge planning needs.   Assessment/plan status:  Psychosocial Support/Ongoing Assessment of Needs Other assessment/ plan:   none.   Information/referral to community resources:   [facility] bed offers.    PATIENT'S/FAMILY'S RESPONSE TO PLAN OF CARE: Pt understanding and agreeable to CSW plan of care. Pt expressed no further questions or concerns at this time.       Tammie [name], [place] ([phone]) Licensed Clinical Social Worker Neuroscience [phone]) and Medical ICU (69M)

## 2014-04-23 NOTE — Progress Notes (Signed)
[facility] PROGRESS NOTE  Tammie [name] IEP:[id number] DOB: Feb 28, 1961 DOA: 04/15/2014 PCP: No PCP Per Patient Brief hpI: 53 y/o [name] admitted to [facility] on 9/11 after being seen for cough, congestion and chest pain. Medicated with ASA & NTG. Pt developed increased SOB & Anaphylactic reaction requiring emergent intubation. PCCM called for admission  Assessment/Plan: Acute respiratory failure with respiratory acidosis: -s/p intubation and extubation, on [place] oxygen at this time. Duo nebs and steroids. Lasix daily. Wean her off oxygen . wean her off the steroids.   Hypertension: Controlled   Tachycardia: resolved and prn lopressor.   Acute encephalopathy: secondary to acute respiratory failure: improving.    Dysphagia: On dys 2 diet. Unclear why she has dysphagia and generalized weakness and confusion intermittently. MRI brain done did not reveal any acute stroke.  DVT prophylaxis.     Code Status: full code Family Communication: family at bedside Disposition Plan: pending.    Consultants:  none  Events; 9/11 Admit with cough, congestion, chest pain, HTN , anaphylaxis with ASA (was not listed as an allergy)  9/12 trop neg  9/14 Extubated. Agitated with hypertension and tachycardia post extubation. Precedex initiated. Much improved. Clonidine ordered  9/14 sudden desat requiring bipap   Antibiotics:    HPI/Subjective: No chest pain. No sob. Awaiting placement at SNF.   Objective: Filed Vitals:   04/23/14 1345  BP: 125/70  Pulse: 82  Temp: 98.4 F (36.9 C)  Resp: 19    Intake/Output Summary (Last 24 hours) at 04/23/14 1354 Last data filed at 04/23/14 1300  Gross per 24 hour  Intake    240 ml  Output      1 ml  Net    239 ml   Filed Weights   04/21/14 0358 04/22/14 0354 04/23/14 0458  Weight: 58.5 kg (128 lb 15.5 oz) 58.3 kg (128 lb 8.5 oz) 58.06 kg (128 lb)    Exam:   General:  aler afebrile comfortable  Cardiovascular: s1s2  Respiratory:  scattered rhonchi  Abdomen: soft NT ND BS+  Musculoskeletal: no pedal edema.   Data Reviewed: Basic Metabolic Panel:  Recent Labs Lab 04/16/14 2120  04/17/14 1253 04/17/14 2210  04/18/14 1055 04/19/14 0324 04/20/14 0548 04/21/14 1510 04/22/14 0342  NA  --   < >  --   --   < > 148* 151* 150* 146 143  K  --   < >  --   --   < > 4.9 4.2 4.1 4.2 3.8  CL  --   < >  --   --   < > 109 112 109 106 101  CO2  --   < >  --   --   < > 23 28 28 28 28   GLUCOSE  --   < >  --   --   < > 114* 124* 110* 166* 100*  BUN  --   < >  --   --   < > 22 28* 30* 29* 24*  CREATININE  --   < >  --   --   < > 0.75 0.90 0.87 0.96 0.91  CALCIUM  --   < >  --   --   < > 9.5 9.3 9.8 10.0 9.3  MG 2.5  --  2.3 2.4  --   --   --   --   --   --   PHOS 3.6  --  3.7 3.0  --   --   --   --   --   --   < > =  values in this interval not displayed. Liver Function Tests:  Recent Labs Lab 04/17/14 0343  AST 21  ALT 16  ALKPHOS 72  BILITOT <0.2*  PROT 6.4  ALBUMIN 2.9*   No results found for this basename: LIPASE, AMYLASE,  in the last 168 hours No results found for this basename: AMMONIA,  in the last 168 hours CBC:  Recent Labs Lab 04/17/14 0343 04/18/14 0320 04/20/14 0548 04/22/14 0342  WBC 13.5* 10.6* 11.3* 9.5  NEUTROABS 12.4* 9.9*  --   --   HGB 12.2 10.0* 14.4 14.6  HCT 36.7 27.2* 43.3 44.0  MCV 83.0 88.6 83.1 82.9  PLT 287 210 330 257   Cardiac Enzymes: No results found for this basename: CKTOTAL, CKMB, CKMBINDEX, TROPONINI,  in the last 168 hours BNP (last 3 results)  Recent Labs  04/15/14 1359  PROBNP 26.9   CBG:  Recent Labs Lab 04/18/14 0441 04/18/14 0717 04/18/14 1343 04/18/14 1638 04/18/14 2121  GLUCAP 155* 161* 128* 142* 166*    Recent Results (from the past 240 hour(s))  MRSA PCR SCREENING     Status: None   Collection Time    04/15/14  5:47 PM      Result Value Ref Range Status   MRSA by PCR NEGATIVE  NEGATIVE Final   Comment:            The GeneXpert MRSA  Assay (FDA     approved for NASAL specimens     only), is one component of a     comprehensive MRSA colonization     surveillance program. It is not     intended to diagnose MRSA     infection nor to guide or     monitor treatment for     MRSA infections.  CULTURE, BLOOD (ROUTINE X 2)     Status: None   Collection Time    04/15/14  8:00 PM      Result Value Ref Range Status   Specimen Description BLOOD RIGHT ARM   Final   Special Requests BOTTLES DRAWN AEROBIC AND ANAEROBIC 10CC   Final   Culture  Setup Time     Final   Value: 04/16/2014 02:07     Performed at [facility]   Culture     Final   Value: NO GROWTH 5 DAYS     Performed at [facility]   Report Status 04/22/2014 FINAL   Final  CULTURE, BLOOD (ROUTINE X 2)     Status: None   Collection Time    04/15/14  8:10 PM      Result Value Ref Range Status   Specimen Description BLOOD RIGHT FOREARM   Final   Special Requests BOTTLES DRAWN AEROBIC AND ANAEROBIC [facility]   Final   Culture  Setup Time     Final   Value: 04/16/2014 02:06     Performed at [facility]   Culture     Final   Value: NO GROWTH 5 DAYS     Performed at [facility]   Report Status 04/22/2014 FINAL   Final  CULTURE, RESPIRATORY (NON-EXPECTORATED)     Status: None   Collection Time    04/16/14  9:25 AM      Result Value Ref Range Status   Specimen Description TRACHEAL ASPIRATE   Final   Special Requests NONE   Final   Gram Stain     Final   Value: FEW WBC PRESENT,BOTH PMN AND MONONUCLEAR  RARE SQUAMOUS EPITHELIAL CELLS PRESENT     NO ORGANISMS SEEN     Performed at [facility]   Culture     Final   Value: Non-Pathogenic Oropharyngeal-type [place] Isolated.     Performed at [facility]   Report Status 04/19/2014 FINAL   Final     Studies: Mr Brain Wo Contrast  04/22/2014   CLINICAL DATA:  Acute encephalopathy. Headaches and dizziness. Question CVA.  EXAM: MRI HEAD WITHOUT CONTRAST  TECHNIQUE:  Multiplanar, multiecho pulse sequences of the brain and surrounding structures were obtained without intravenous contrast.  COMPARISON:  None available.  FINDINGS: The diffusion-weighted images demonstrate no evidence for acute or subacute infarction. The study is moderately degraded by patient motion. From the cerebellar tonsils extend 4 mm below the foramen magnum in the midline. A appears somewhat pointed on the right.  Periventricular and subcortical white matter changes are greater than expected for age. No hemorrhage or mass lesion is present. The ventricles are of normal size. There is no significant atrophy. No significant extraaxial fluid collection is present.  Flow is present in the major intracranial arteries.  There is diffuse opacification of paranasal sinuses. Fluid levels are present in the right sphenoid sinus and left maxillary sinus. The frontal sinuses are not pneumatized. The mastoid air cells are clear.  Endplate degenerative changes are associated with kyphosis of the upper cervical spine.  IMPRESSION: 1. No acute infarct. 2. Mild periventricular and subcortical white matter changes bilaterally are somewhat advanced for age. The finding is nonspecific but can be seen in the setting of chronic microvascular ischemia, a demyelinating process such as multiple sclerosis, vasculitis, complicated migraine headaches, or as the sequelae of a prior infectious or inflammatory process. 3. Borderline Chiari malformation. 4. Extensive sinus disease with fluid levels in the left maxillary sinus and right sphenoid sinus. 5. Degenerative changes of the upper cervical spine.   Electronically Signed   By: Lawrence Santiago M.D.   On: 04/22/2014 16:40    Scheduled Meds: . antiseptic oral rinse  7 mL Mouth Rinse BID  . bisoprolol  2.5 mg Oral Daily  . budesonide  0.25 mg Nebulization BID  . cloNIDine  0.3 mg Transdermal Weekly  . feeding supplement (ENSURE)  1 Container Oral TID BM  . free water  200 mL  Oral 3 times per day  . furosemide  10 mg Intravenous Daily  . heparin  5,000 Units Subcutaneous 3 times per day  . hydrALAZINE  25 mg Oral 3 times per day  . ipratropium-albuterol  3 mL Nebulization BID   Continuous Infusions:    Active Problems:   Anaphylaxis   Status asthmaticus   Hypertension   Sinus tachycardia   Acute respiratory failure with hypercapnia   Delirium    Time spent: 25 min    [facility] Pager [phone] If 7PM-7AM, please contact night-coverage at [email or web address], password [facility] 04/23/2014, 1:54 PM  LOS: 8 days

## 2014-04-24 NOTE — Progress Notes (Signed)
[facility] PROGRESS NOTE  Tammie [name] TDD:[id number] DOB: Dec 18, 1960 DOA: 04/15/2014 PCP: No PCP Per Patient Brief hpI: 53 y/o [name] admitted to [facility] on 9/11 after being seen for cough, congestion and chest pain. Medicated with ASA & NTG. Pt developed increased SOB & Anaphylactic reaction requiring emergent intubation. PCCM called for admission  Assessment/Plan: Acute respiratory failure with respiratory acidosis: -s/p intubation and extubation, on [place] oxygen at this time. Duo nebs and steroids. Lasix daily. Wean her off oxygen . wean her off the steroids.   Hypertension: Controlled   Tachycardia: resolved and prn lopressor.   Acute encephalopathy: secondary to acute respiratory failure: improving.    Dysphagia: On dys 2 diet. Unclear why she has dysphagia and generalized weakness and confusion intermittently. MRI brain done did not reveal any acute stroke.  DVT prophylaxis.     Code Status: full code Family Communication: family at bedside Disposition Plan: pending.    Consultants:  none  Events; 9/11 Admit with cough, congestion, chest pain, HTN , anaphylaxis with ASA (was not listed as an allergy)  9/12 trop neg  9/14 Extubated. Agitated with hypertension and tachycardia post extubation. Precedex initiated. Much improved. Clonidine ordered  9/14 sudden desat requiring bipap   Antibiotics:    HPI/Subjective: No chest pain. No sob. Awaiting placement at SNF.   Objective: Filed Vitals:   04/24/14 1439  BP: 122/80  Pulse: 87  Temp: 97.6 F (36.4 C)  Resp:     Intake/Output Summary (Last 24 hours) at 04/24/14 1540 Last data filed at 04/24/14 1454  Gross per 24 hour  Intake    920 ml  Output      0 ml  Net    920 ml   Filed Weights   04/22/14 0354 04/23/14 0458 04/24/14 0421  Weight: 58.3 kg (128 lb 8.5 oz) 58.06 kg (128 lb) 57.516 kg (126 lb 12.8 oz)    Exam:   General:  aler afebrile comfortable  Cardiovascular: s1s2  Respiratory:  scattered rhonchi  Abdomen: soft NT ND BS+  Musculoskeletal: no pedal edema.   Data Reviewed: Basic Metabolic Panel:  Recent Labs Lab 04/17/14 2210  04/18/14 1055 04/19/14 0324 04/20/14 0548 04/21/14 1510 04/22/14 0342  NA  --   < > 148* 151* 150* 146 143  K  --   < > 4.9 4.2 4.1 4.2 3.8  CL  --   < > 109 112 109 106 101  CO2  --   < > 23 28 28 28 28   GLUCOSE  --   < > 114* 124* 110* 166* 100*  BUN  --   < > 22 28* 30* 29* 24*  CREATININE  --   < > 0.75 0.90 0.87 0.96 0.91  CALCIUM  --   < > 9.5 9.3 9.8 10.0 9.3  MG 2.4  --   --   --   --   --   --   PHOS 3.0  --   --   --   --   --   --   < > = values in this interval not displayed. Liver Function Tests: No results found for this basename: AST, ALT, ALKPHOS, BILITOT, PROT, ALBUMIN,  in the last 168 hours No results found for this basename: LIPASE, AMYLASE,  in the last 168 hours No results found for this basename: AMMONIA,  in the last 168 hours CBC:  Recent Labs Lab 04/18/14 0320 04/20/14 0548 04/22/14 0342  WBC 10.6* 11.3* 9.5  NEUTROABS  9.9*  --   --   HGB 10.0* 14.4 14.6  HCT 27.2* 43.3 44.0  MCV 88.6 83.1 82.9  PLT 210 330 257   Cardiac Enzymes: No results found for this basename: CKTOTAL, CKMB, CKMBINDEX, TROPONINI,  in the last 168 hours BNP (last 3 results)  Recent Labs  04/15/14 1359  PROBNP 26.9   CBG:  Recent Labs Lab 04/18/14 0441 04/18/14 0717 04/18/14 1343 04/18/14 1638 04/18/14 2121  GLUCAP 155* 161* 128* 142* 166*    Recent Results (from the past 240 hour(s))  MRSA PCR SCREENING     Status: None   Collection Time    04/15/14  5:47 PM      Result Value Ref Range Status   MRSA by PCR NEGATIVE  NEGATIVE Final   Comment:            The GeneXpert MRSA Assay (FDA     approved for NASAL specimens     only), is one component of a     comprehensive MRSA colonization     surveillance program. It is not     intended to diagnose MRSA     infection nor to guide or     monitor  treatment for     MRSA infections.  CULTURE, BLOOD (ROUTINE X 2)     Status: None   Collection Time    04/15/14  8:00 PM      Result Value Ref Range Status   Specimen Description BLOOD RIGHT ARM   Final   Special Requests BOTTLES DRAWN AEROBIC AND ANAEROBIC 10CC   Final   Culture  Setup Time     Final   Value: 04/16/2014 02:07     Performed at [facility]   Culture     Final   Value: NO GROWTH 5 DAYS     Performed at [facility]   Report Status 04/22/2014 FINAL   Final  CULTURE, BLOOD (ROUTINE X 2)     Status: None   Collection Time    04/15/14  8:10 PM      Result Value Ref Range Status   Specimen Description BLOOD RIGHT FOREARM   Final   Special Requests BOTTLES DRAWN AEROBIC AND ANAEROBIC [facility]   Final   Culture  Setup Time     Final   Value: 04/16/2014 02:06     Performed at [facility]   Culture     Final   Value: NO GROWTH 5 DAYS     Performed at [facility]   Report Status 04/22/2014 FINAL   Final  CULTURE, RESPIRATORY (NON-EXPECTORATED)     Status: None   Collection Time    04/16/14  9:25 AM      Result Value Ref Range Status   Specimen Description TRACHEAL ASPIRATE   Final   Special Requests NONE   Final   Gram Stain     Final   Value: FEW WBC PRESENT,BOTH PMN AND MONONUCLEAR     RARE SQUAMOUS EPITHELIAL CELLS PRESENT     NO ORGANISMS SEEN     Performed at [facility]   Culture     Final   Value: Non-Pathogenic Oropharyngeal-type Flora Isolated.     Performed at [facility]   Report Status 04/19/2014 FINAL   Final     Studies: Mr Brain Wo Contrast  04/22/2014   CLINICAL DATA:  Acute encephalopathy. Headaches and dizziness. Question CVA.  EXAM: MRI HEAD WITHOUT CONTRAST  TECHNIQUE: Multiplanar, multiecho pulse sequences of the brain and surrounding structures were obtained without intravenous contrast.  COMPARISON:  None available.  FINDINGS: The diffusion-weighted images demonstrate no evidence for acute or  subacute infarction. The study is moderately degraded by patient motion. From the cerebellar tonsils extend 4 mm below the foramen magnum in the midline. A appears somewhat pointed on the right.  Periventricular and subcortical white matter changes are greater than expected for age. No hemorrhage or mass lesion is present. The ventricles are of normal size. There is no significant atrophy. No significant extraaxial fluid collection is present.  Flow is present in the major intracranial arteries.  There is diffuse opacification of paranasal sinuses. Fluid levels are present in the right sphenoid sinus and left maxillary sinus. The frontal sinuses are not pneumatized. The mastoid air cells are clear.  Endplate degenerative changes are associated with kyphosis of the upper cervical spine.  IMPRESSION: 1. No acute infarct. 2. Mild periventricular and subcortical white matter changes bilaterally are somewhat advanced for age. The finding is nonspecific but can be seen in the setting of chronic microvascular ischemia, a demyelinating process such as multiple sclerosis, vasculitis, complicated migraine headaches, or as the sequelae of a prior infectious or inflammatory process. 3. Borderline Chiari malformation. 4. Extensive sinus disease with fluid levels in the left maxillary sinus and right sphenoid sinus. 5. Degenerative changes of the upper cervical spine.   Electronically Signed   By: Lawrence Santiago M.D.   On: 04/22/2014 16:40    Scheduled Meds: . antiseptic oral rinse  7 mL Mouth Rinse BID  . bisoprolol  2.5 mg Oral Daily  . budesonide  0.25 mg Nebulization BID  . cloNIDine  0.3 mg Transdermal Weekly  . feeding supplement (ENSURE)  1 Container Oral TID BM  . free water  200 mL Oral 3 times per day  . furosemide  10 mg Intravenous Daily  . heparin  5,000 Units Subcutaneous 3 times per day  . hydrALAZINE  25 mg Oral 3 times per day  . ipratropium-albuterol  3 mL Nebulization BID   Continuous  Infusions:    Active Problems:   Anaphylaxis   Status asthmaticus   Hypertension   Sinus tachycardia   Acute respiratory failure with hypercapnia   Delirium    Time spent: 25 min    [facility] Pager [phone] If 7PM-7AM, please contact night-coverage at [email or web address], password [facility] 04/24/2014, 3:40 PM  LOS: 9 days

## 2014-04-25 ENCOUNTER — Inpatient Hospital Stay (HOSPITAL_COMMUNITY): Payer: No Typology Code available for payment source

## 2014-04-25 LAB — BASIC METABOLIC PANEL
Anion gap: 13 (ref 5–15)
BUN: 17 mg/dL (ref 6–23)
CALCIUM: 9.3 mg/dL (ref 8.4–10.5)
CHLORIDE: 101 meq/L (ref 96–112)
CO2: 25 meq/L (ref 19–32)
Creatinine, Ser: 0.98 mg/dL (ref 0.50–1.10)
GFR calc Af Amer: 74 mL/min — ABNORMAL LOW (ref >90)
GFR calc non Af Amer: 64 mL/min — ABNORMAL LOW (ref >90)
Glucose, Bld: 128 mg/dL — ABNORMAL HIGH (ref 70–99)
POTASSIUM: 3.9 meq/L (ref 3.7–5.3)
SODIUM: 139 meq/L (ref 137–147)

## 2014-04-25 NOTE — Procedures (Signed)
Objective Swallowing Evaluation: Modified Barium Swallowing Study  Patient Details   Name: Tammie [name] MRN: [id number] Date of Birth: 1961-03-24  Today's Date: 04/25/2014 Time: 0940-1005 SLP Time Calculation (min): 25 min  Past Medical History:  Past Medical History  Diagnosis Date  . Hypertension   . Environmental allergies   . Asthma   . Shortness of breath   . Respiratory failure, acute 04/19/2014  . Anxiety   . GERD (gastroesophageal reflux disease)   . [id number](417.4)    Past Surgical History:  Past Surgical History  Procedure Laterality Date  . Abdominal hysterectomy     HPI:  53 y/o female admitted to [facility] on 9/11 after being seen for cough, congestion and chest pain. Medicated with ASA & NTG. Pt developed increased SOB & anaphylactic reaction requiring emergent intubation; extubated 9/14.  Nursing reports pt was given water to drink,  followed by a 30 min coughing spell during which pt desaturated, bipap was initiated with good results.  Now with encephalopathy, agitation; NPO.     Assessment / Plan / Recommendation Clinical Impression  Dysphagia Diagnosis: Mild oral phase dysphagia;Moderate pharyngeal phase dysphagia Clinical impression: Pt demonstrates a mild oral stage and moderate pharyngeal phase dysphagia with both sensory and motor components (sensory>motor). Her swallow is consistently delayed to the valleculae with instances of penetration before the swallow, and aspiration (cough)both before and during the swallow with trials of thin liquids. Decreased overall coordination of the swallow and decreased contraction of the posterior pharyngeal are evident. SLP cued for chin tuck which did not reduce aspiration and appeared to increase the amout of space between the base of tongue and the posterior pharyngeal wall, rather than reducing it as expected. Pt also demonstrated idiosyncratic compensation (neck extension) which increased the veolcity of the blous resulting in  immediate aspiration. In addition, suspect respiratory/swallow discoordination resulting in possible premature abduction of vocal cords during the swallow increasing aspiration risk. Verbal cueing provided for neutral head position allowed for smaller sips of nectar without signs of penetration/aspiration. Recommend dysphagia 3 (mechanical soft) with nectar thin liquids. SLP to follow up with training for compensatory strategies and diet advancement as appropriate.     Treatment Recommendation  Therapy as outlined in treatment plan below    Diet Recommendation Dysphagia 3 (Mechanical Soft);Nectar-thick liquid   Liquid Administration via: Cup;No straw Medication Administration: Whole meds with puree Supervision: Patient able to self feed;Full supervision/cueing for compensatory strategies Compensations: Slow rate;Small sips/bites;Clear throat intermittently Postural Changes and/or Swallow Maneuvers: Seated upright 90 degrees    Other  Recommendations Oral Care Recommendations: Oral care BID   Follow Up Recommendations  Skilled Nursing facility    Frequency and Duration min 2x/week  2 weeks   Pertinent Vitals/Pain No indications of pain          Reason for Referral Objectively evaluate swallowing function   Oral Phase Oral Preparation/Oral Phase Oral Phase: WFL   Pharyngeal Phase Pharyngeal Phase Pharyngeal Phase: Impaired Pharyngeal - Nectar Pharyngeal - Nectar Cup: Delayed swallow initiation;Premature spillage to valleculae;Reduced pharyngeal peristalsis Pharyngeal - Thin Pharyngeal - Thin Cup: Delayed swallow initiation;Premature spillage to valleculae;Reduced airway/laryngeal closure;Penetration/Aspiration before swallow;Penetration/Aspiration during swallow;Compensatory strategies attempted (Comment);Significant aspiration (Amount) (chin tuck) Penetration/Aspiration details (thin cup): Material enters airway, passes BELOW cords and not ejected out despite cough attempt by  patient Pharyngeal - Solids Pharyngeal - Regular: Delayed swallow initiation;Premature spillage to valleculae;Premature spillage to pyriform sinuses  Cervical Esophageal Phase        Cervical Esophageal Phase Cervical Esophageal  Phase: [name] 04/25/2014, 1:46 PM

## 2014-04-25 NOTE — Progress Notes (Signed)
Pt to d/c to SNF: family at bedside; pt to be transported by ambulance; will cont. To monitor.

## 2014-04-25 NOTE — Progress Notes (Addendum)
CSW (Clinical Education officer, museum) prepared pt dc packet and placed with shadow chart. Packet missing signed FL2. Pt nurse notified and will page MD and notify transport has been arranged and need FL2 signed. CSW arranged non-emergent ambulance transport for 4:30pm. Pt, pt family, pt nurse, and facility informed. CSW signing off.  [place], [place]

## 2014-04-25 NOTE — Discharge Summary (Signed)
Physician Discharge Summary  Tammie [name] UVO:[id number] DOB: 23-Mar-1961 DOA: 04/15/2014  PCP: No PCP Per Patient  Admit date: 04/15/2014 Discharge date: 04/25/2014  Time spent: 30 minutes  Recommendations for Outpatient Follow-up:  1. Follow up with PCP in one week.  Discharge Diagnoses:  Active Problems:   Anaphylaxis   Status asthmaticus   Hypertension   Sinus tachycardia   Acute respiratory failure with hypercapnia   Delirium   Discharge Condition: improved.   Diet recommendation: low sodium diet  Filed Weights   04/24/14 0421 04/25/14 0405 04/25/14 0532  Weight: 57.516 kg (126 lb 12.8 oz) 57.471 kg (126 lb 11.2 oz) 57.2 kg (126 lb 1.7 oz)    History of present illness:  53 y/o [name] admitted to [facility] on 9/11 after being seen for cough, congestion and chest pain. Medicated with ASA & NTG. Pt developed increased SOB & Anaphylactic reaction requiring emergent intubation. PCCM called for admission. She was extubated and symptoms resolved. She was weaned off the oxygen. PT evaluation recommended 24 hour supervision. And she will be discharged when bed available.    Hospital Course:  Acute respiratory failure with respiratory acidosis:  -s/p intubation and extubation, on [place] oxygen at this time. Duo nebs and steroids. . Weaned her off oxygen . weaned her off the steroids.  Hypertension:  Controlled  Tachycardia: resolved and prn lopressor.  Acute encephalopathy: secondary to acute respiratory failure: improved. Marland Kitchen  Dysphagia:  On dys 3 with nectar thick  diet. Unclear why she has dysphagia and generalized weakness and confusion intermittently. MRI brain done did not reveal any acute stroke, chronic white matter changes.      Procedures:  MRI brain  MBS   Consultations:  pccm  Discharge Exam: Filed Vitals:   04/25/14 1135  BP: 120/76  Pulse: 92  Temp:   Resp:     General: alert afebrile comfortable Cardiovascular: s1s2 Respiratory: ctab  Discharge  Instructions You were cared for by a hospitalist during your hospital stay. If you have any questions about your discharge medications or the care you received while you were in the hospital after you are discharged, you can call the unit and asked to speak with the hospitalist on call if the hospitalist that took care of you is not available. Once you are discharged, your primary care physician will handle any further medical issues. Please note that NO REFILLS for any discharge medications will be authorized once you are discharged, as it is imperative that you return to your primary care physician (or establish a relationship with a primary care physician if you do not have one) for your aftercare needs so that they can reassess your need for medications and monitor your lab values.  Discharge Instructions   Diet - low sodium heart healthy    Complete by:  As directed      Discharge instructions    Complete by:  As directed   Follow up with PCP in one week.          Current Discharge Medication List    START taking these medications   Details  bisoprolol (ZEBETA) 5 MG tablet Take 0.5 tablets (2.5 mg total) by mouth daily. Qty: 30 tablet, Refills: 0    cloNIDine (CATAPRES - DOSED IN MG/24 HR) 0.3 mg/24hr patch Place 1 patch (0.3 mg total) onto the skin once a week. Qty: 4 patch, Refills: 1    hydrALAZINE (APRESOLINE) 25 MG tablet Take 1 tablet (25 mg total) by mouth every 8 (  eight) hours. Qty: 90 tablet, Refills: 1      CONTINUE these medications which have NOT CHANGED   Details  albuterol (PROVENTIL HFA;VENTOLIN HFA) 108 (90 BASE) MCG/ACT inhaler Inhale 2 puffs into the lungs every 4 (four) hours as needed for wheezing (bronchitis).     fluticasone (FLONASE) 50 MCG/ACT nasal spray Place 2 sprays into both nostrils daily.        Allergies  Allergen Reactions  . Aspirin Anaphylaxis, Swelling and Other (See Comments)    Throat swelling   . Ibuprofen Anaphylaxis, Swelling and  Other (See Comments)    Throat swelling  . Nsaids Anaphylaxis, Swelling and Other (See Comments)  . Ace Inhibitors Other (See Comments)    Angioedema  . Lipitor [Atorvastatin] Swelling and Other (See Comments)    Face, feet swell   Follow-up Information   Follow up with No PCP Per Patient.   Specialty:  General Practice       The results of significant diagnostics from this hospitalization (including imaging, microbiology, ancillary and laboratory) are listed below for reference.    Significant Diagnostic Studies: Dg Chest 1 View  04/20/2014   CLINICAL DATA:  Respiratory failure.  EXAM: CHEST - 1 VIEW  COMPARISON:  04/19/2014  FINDINGS: Lungs are hypoinflated without consolidation or effusion. Cardiomediastinal silhouette and remainder of the exam is unchanged.  IMPRESSION: Hypoinflation without acute cardiopulmonary disease.   Electronically Signed   By: Marin Olp M.D.   On: 04/20/2014 08:43   Ct Angio Chest Pe W/cm &/or Wo Cm  04/16/2014   CLINICAL DATA:  Cough, congestion, shortness of breath. Patient on ventilator. Possible anaphylaxis.  EXAM: CT ANGIOGRAPHY CHEST WITH CONTRAST  TECHNIQUE: Multidetector CT imaging of the chest was performed using the standard protocol during bolus administration of intravenous contrast. Multiplanar CT image reconstructions and MIPs were obtained to evaluate the vascular anatomy.  CONTRAST:  66mL OMNIPAQUE IOHEXOL 350 MG/ML SOLN  COMPARISON:  Chest radiograph 04/16/2014  FINDINGS: Endotracheal tube is in satisfactory position. An enteric tube can be followed into the stomach and coils in the fundus, but its distal tip is not included on this exam.  The heart is moderately enlarged and all 4 chambers of the heart appear slightly dilated. There is some reflux of contrast into the inferior vena cava and hepatic veins. Negative a pleural the of the.  Central pulmonary arteries are normal in size. Pulmonary arterial tree is well opacified with contrast. No  focal filling defects are identified to suggest pulmonary embolism.  Lung windows demonstrate slight respiratory motion. There is mild dependent atelectasis in both lower lobes. No airspace disease. The trachea and mainstem bronchi are patent.  Images of the upper abdomen somewhat limited by early arterial phase imaging. 7 mm low-density lesion in the left lobe of the liver measures water density, compatible with a small cyst.  There is some endplate sclerosis at the C6-C7 level. Thoracic spine vertebral bodies are normal in height and alignment. No suspicious osseous lesion or fracture.  Review of the MIP images confirms the above findings.  IMPRESSION: 1. Negative for pulmonary embolism. 2. Cardiomegaly with suggestion of mild dilatation of all 4 chambers of the heart. There is some reflux of contrast into the inferior vena cava and hepatic veins, which can be seen in the setting of elevated right heart pressures. 3. Mild atelectasis in both lower lobes of lungs. Otherwise, the lungs are clear. 4. An enteric tube can be followed into the stomach, but its distal tip  is not included on the study.   Electronically Signed   By: Curlene Dolphin M.D.   On: 04/16/2014 13:37   Mr Brain Wo Contrast  04/22/2014   CLINICAL DATA:  Acute encephalopathy. Headaches and dizziness. Question CVA.  EXAM: MRI HEAD WITHOUT CONTRAST  TECHNIQUE: Multiplanar, multiecho pulse sequences of the brain and surrounding structures were obtained without intravenous contrast.  COMPARISON:  None available.  FINDINGS: The diffusion-weighted images demonstrate no evidence for acute or subacute infarction. The study is moderately degraded by patient motion. From the cerebellar tonsils extend 4 mm below the foramen magnum in the midline. A appears somewhat pointed on the right.  Periventricular and subcortical white matter changes are greater than expected for age. No hemorrhage or mass lesion is present. The ventricles are of normal size. There is  no significant atrophy. No significant extraaxial fluid collection is present.  Flow is present in the major intracranial arteries.  There is diffuse opacification of paranasal sinuses. Fluid levels are present in the right sphenoid sinus and left maxillary sinus. The frontal sinuses are not pneumatized. The mastoid air cells are clear.  Endplate degenerative changes are associated with kyphosis of the upper cervical spine.  IMPRESSION: 1. No acute infarct. 2. Mild periventricular and subcortical white matter changes bilaterally are somewhat advanced for age. The finding is nonspecific but can be seen in the setting of chronic microvascular ischemia, a demyelinating process such as multiple sclerosis, vasculitis, complicated migraine headaches, or as the sequelae of a prior infectious or inflammatory process. 3. Borderline Chiari malformation. 4. Extensive sinus disease with fluid levels in the left maxillary sinus and right sphenoid sinus. 5. Degenerative changes of the upper cervical spine.   Electronically Signed   By: Lawrence Santiago M.D.   On: 04/22/2014 16:40   Dg Chest Port 1 View  04/19/2014   CLINICAL DATA:  Difficulty breathing  EXAM: PORTABLE CHEST - 1 VIEW  COMPARISON:  April 18, 2014  FINDINGS: There is no edema or consolidation. Heart is borderline enlarged with pulmonary vascularity within normal limits. No adenopathy. No bone lesions.  IMPRESSION: No edema or consolidation.  Mild cardiac prominence.   Electronically Signed   By: Lowella Grip M.D.   On: 04/19/2014 07:06   Dg Chest Port 1 View  04/18/2014   CLINICAL DATA:  Respiratory failure.  EXAM: PORTABLE CHEST - 1 VIEW  COMPARISON:  04/18/2014 earlier.  FINDINGS: The lungs are somewhat hypoinflated without focal consolidation or effusion. Cardiomediastinal silhouette and remainder the exam is unchanged.  IMPRESSION: Hypoinflation without acute cardiopulmonary disease.   Electronically Signed   By: Marin Olp M.D.   On: 04/18/2014  21:35   Dg Chest Port 1 View  04/18/2014   CLINICAL DATA:  Assess endotracheal and support tube placement  EXAM: PORTABLE CHEST - 1 VIEW  COMPARISON:  Portable chest x-ray of April 17, 2014  FINDINGS: The endotracheal tube tip lies 3 cm above the crotch of the carina. The esophagogastric tube tip projects off the inferior margin of the study. The lungs are reasonably well inflated. Minimal prominence of the interstitial markings on the left is noted. The cardiopericardial silhouette is mildly enlarged. The pulmonary vascularity is prominent centrally especially on the left. There is no pleural effusion. The observed bony thorax is unremarkable.  IMPRESSION: The support tube positioning is good. Mild prominence of the pulmonary interstitial markings on the left may reflect low-grade asymmetric interstitial edema. There is no evidence of pneumonia.   Electronically Signed  By: David  Martinique   On: 04/18/2014 06:56   Dg Chest Port 1 View  04/17/2014   CLINICAL DATA:  Evaluate for pulmonary edema.  EXAM: PORTABLE CHEST - 1 VIEW  COMPARISON:  Chest x-ray 04/16/2014.  FINDINGS: An endotracheal tube is in place with tip 3.3 cm above the carina. A nasogastric tube is seen extending into the stomach, however, the tip of the nasogastric tube extends below the lower margin of the image. Lung volumes are slightly low and there are some linear bibasilar opacities most compatible with mild subsegmental atelectasis. No confluent consolidative airspace disease. No pleural effusions. Mild crowding of the pulmonary vasculature, accentuated by low lung volumes, without frank pulmonary edema. Mild cardiomegaly is unchanged. Upper mediastinal contours are within normal limits.  IMPRESSION: 1. Support apparatus, as above. 2. Low lung volumes with mild bibasilar subsegmental atelectasis. 3. Cardiomegaly. 4. No evidence of pulmonary edema.   Electronically Signed   By: Vinnie Langton M.D.   On: 04/17/2014 07:22   Dg Chest  Port 1 View  04/16/2014   CLINICAL DATA:  Evaluate for airspace disease and evaluate endotracheal tube  EXAM: PORTABLE CHEST - 1 VIEW  COMPARISON:  04/15/2014  FINDINGS: Endotracheal tube 3.2 cm above the carina. NG tube crosses into the stomach.  Mild cardiac enlargement stable. There is stable mild vascular congestion. No evidence of consolidation or pulmonary edema.  IMPRESSION: No evidence of pulmonary edema.  Endotracheal tube as described.   Electronically Signed   By: Skipper Cliche M.D.   On: 04/16/2014 07:38   Dg Chest Portable 1 View  04/15/2014   CLINICAL DATA:  Shortness of breath  EXAM: PORTABLE CHEST - 1 VIEW  COMPARISON:  09/19/2013  FINDINGS: Endotracheal tube is noted 12 mm above the carina. This could be withdrawn 1-2 cm. The cardiac shadow is stable. A nasogastric catheter seen coiled within the stomach. The lungs are well-aerated without focal infiltrate or sizable effusion.  IMPRESSION: Endotracheal tube just above the carina. This could be withdrawn 1-2 cm.   Electronically Signed   By: Inez Catalina M.D.   On: 04/15/2014 13:49   Dg Abd Portable 1v  04/15/2014   CLINICAL DATA:  Orogastric tube placement.  EXAM: PORTABLE ABDOMEN - 1 VIEW  COMPARISON:  None.  FINDINGS: Gaseous distention of the stomach is present. There is an enteric tube with the redundant loop near the cardia of the stomach. The tip of the tube is in the mid gastric fundus.  IMPRESSION: Enteric tube tip in the mid gastric fundus.   Electronically Signed   By: Dereck Ligas M.D.   On: 04/15/2014 19:36   Dg Swallowing Func-speech Pathology  04/25/2014   Eden Emms, [place]     04/25/2014  2:01 PM Objective Swallowing Evaluation: Modified Barium Swallowing Study   Patient Details   Name: Tammie [name] MRN: [id number] Date of Birth: 1960-10-10  Today's Date: 04/25/2014 Time: 0940-1005 SLP Time Calculation (min): 25 min  Past Medical History:  Past Medical History  Diagnosis Date  . Hypertension   . Environmental  allergies   . Asthma   . Shortness of breath   . Respiratory failure, acute 04/19/2014  . Anxiety   . GERD (gastroesophageal reflux disease)   . SWFUXNAT(557.3)    Past Surgical History:  Past Surgical History  Procedure Laterality Date  . Abdominal hysterectomy     HPI:  53 y/o female admitted to [facility] on 9/11 after being seen for cough,  congestion and chest pain.  Medicated with ASA & NTG. Pt developed  increased SOB & anaphylactic reaction requiring emergent  intubation; extubated 9/14.  Nursing reports pt was given water  to drink,  followed by a 30 min coughing spell during which pt  desaturated, bipap was initiated with good results.  Now with  encephalopathy, agitation; NPO.     Assessment / Plan / Recommendation Clinical Impression  Dysphagia Diagnosis: Mild oral phase dysphagia;Moderate  pharyngeal phase dysphagia Clinical impression: Pt demonstrates a mild oral stage and  moderate pharyngeal phase dysphagia with both sensory and motor  components (sensory>motor). Her swallow is consistently delayed  to the valleculae with instances of penetration before the  swallow, and aspiration (cough)both before and during the swallow  with trials of thin liquids. Decreased overall coordination of  the swallow and decreased contraction of the posterior pharyngeal  are evident. SLP cued for chin tuck which did not reduce  aspiration and appeared to increase the amout of space between  the base of tongue and the posterior pharyngeal wall, rather than  reducing it as expected. Pt also demonstrated idiosyncratic  compensation (neck extension) which increased the veolcity of the  blous resulting in immediate aspiration. In addition, suspect  respiratory/swallow discoordination resulting in possible  premature abduction of vocal cords during the swallow increasing  aspiration risk. Verbal cueing provided for neutral head position  allowed for smaller sips of nectar without signs of  penetration/aspiration. Recommend dysphagia  3 (mechanical soft)  with nectar thin liquids. SLP to follow up with training for  compensatory strategies and diet advancement as appropriate.     Treatment Recommendation  Therapy as outlined in treatment plan below    Diet Recommendation Dysphagia 3 (Mechanical Soft);Nectar-thick  liquid   Liquid Administration via: Cup;No straw Medication Administration: Whole meds with puree Supervision: Patient able to self feed;Full supervision/cueing  for compensatory strategies Compensations: Slow rate;Small sips/bites;Clear throat  intermittently Postural Changes and/or Swallow Maneuvers: Seated upright 90  degrees    Other  Recommendations Oral Care Recommendations: Oral care BID   Follow Up Recommendations  Skilled Nursing facility    Frequency and Duration min 2x/week  2 weeks   Pertinent Vitals/Pain No indications of pain          Reason for Referral Objectively evaluate swallowing function   Oral Phase Oral Preparation/Oral Phase Oral Phase: WFL   Pharyngeal Phase Pharyngeal Phase Pharyngeal Phase: Impaired Pharyngeal - Nectar Pharyngeal - Nectar Cup: Delayed swallow initiation;Premature  spillage to valleculae;Reduced pharyngeal peristalsis Pharyngeal - Thin Pharyngeal - Thin Cup: Delayed swallow initiation;Premature  spillage to valleculae;Reduced airway/laryngeal  closure;Penetration/Aspiration before  swallow;Penetration/Aspiration during swallow;Compensatory  strategies attempted (Comment);Significant aspiration (Amount)  (chin tuck) Penetration/Aspiration details (thin cup): Material enters  airway, passes BELOW cords and not ejected out despite cough  attempt by patient Pharyngeal - Solids Pharyngeal - Regular: Delayed swallow initiation;Premature  spillage to valleculae;Premature spillage to pyriform sinuses  Cervical Esophageal Phase        Cervical Esophageal Phase Cervical Esophageal Phase: [name] 04/25/2014, 1:46 PM     Microbiology: Recent Results (from the past 240 hour(s))   MRSA PCR SCREENING     Status: None   Collection Time    04/15/14  5:47 PM      Result Value Ref Range Status   MRSA by PCR NEGATIVE  NEGATIVE Final   Comment:            The GeneXpert MRSA Assay (FDA  approved for NASAL specimens     only), is one component of a     comprehensive MRSA colonization     surveillance program. It is not     intended to diagnose MRSA     infection nor to guide or     monitor treatment for     MRSA infections.  CULTURE, BLOOD (ROUTINE X 2)     Status: None   Collection Time    04/15/14  8:00 PM      Result Value Ref Range Status   Specimen Description BLOOD RIGHT ARM   Final   Special Requests BOTTLES DRAWN AEROBIC AND ANAEROBIC 10CC   Final   Culture  Setup Time     Final   Value: 04/16/2014 02:07     Performed at [facility]   Culture     Final   Value: NO GROWTH 5 DAYS     Performed at [facility]   Report Status 04/22/2014 FINAL   Final  CULTURE, BLOOD (ROUTINE X 2)     Status: None   Collection Time    04/15/14  8:10 PM      Result Value Ref Range Status   Specimen Description BLOOD RIGHT FOREARM   Final   Special Requests BOTTLES DRAWN AEROBIC AND ANAEROBIC [facility]   Final   Culture  Setup Time     Final   Value: 04/16/2014 02:06     Performed at [facility]   Culture     Final   Value: NO GROWTH 5 DAYS     Performed at [facility]   Report Status 04/22/2014 FINAL   Final  CULTURE, RESPIRATORY (NON-EXPECTORATED)     Status: None   Collection Time    04/16/14  9:25 AM      Result Value Ref Range Status   Specimen Description TRACHEAL ASPIRATE   Final   Special Requests NONE   Final   Gram Stain     Final   Value: FEW WBC PRESENT,BOTH PMN AND MONONUCLEAR     RARE SQUAMOUS EPITHELIAL CELLS PRESENT     NO ORGANISMS SEEN     Performed at [facility]   Culture     Final   Value: Non-Pathogenic Oropharyngeal-type Flora Isolated.     Performed at [facility]   Report Status  04/19/2014 FINAL   Final     Labs: Basic Metabolic Panel:  Recent Labs Lab 04/19/14 0324 04/20/14 0548 04/21/14 1510 04/22/14 0342 04/25/14 1315  NA 151* 150* 146 143 139  K 4.2 4.1 4.2 3.8 3.9  CL 112 109 106 101 101  CO2 28 28 28 28 25   GLUCOSE 124* 110* 166* 100* 128*  BUN 28* 30* 29* 24* 17  CREATININE 0.90 0.87 0.96 0.91 0.98  CALCIUM 9.3 9.8 10.0 9.3 9.3   Liver Function Tests: No results found for this basename: AST, ALT, ALKPHOS, BILITOT, PROT, ALBUMIN,  in the last 168 hours No results found for this basename: LIPASE, AMYLASE,  in the last 168 hours No results found for this basename: AMMONIA,  in the last 168 hours CBC:  Recent Labs Lab 04/20/14 0548 04/22/14 0342  WBC 11.3* 9.5  HGB 14.4 14.6  HCT 43.3 44.0  MCV 83.1 82.9  PLT 330 257   Cardiac Enzymes: No results found for this basename: CKTOTAL, CKMB, CKMBINDEX, TROPONINI,  in the last 168 hours BNP: BNP (last 3 results)  Recent Labs  04/15/14  1359  PROBNP 26.9   CBG:  Recent Labs Lab 04/18/14 1638 04/18/14 2121  GLUCAP 142* 166*       Signed:  Eden Toohey  [facility] 04/25/2014, 2:02 PM

## 2014-04-25 NOTE — Progress Notes (Signed)
CSW (Clinical Education officer, museum) provided pt and pt sister with bed offers. They have accepted bed offer at [facility]. CSW notified facility. CSW also submitted clinicals for insurance authorization and spoke with insurance representative to notify that pt is ready for dc today.  [place], [place]

## 2014-04-25 NOTE — Procedures (Signed)
SLP reviewed report and agrees with assessment and diagnosis  Cranford Mon.Ed [employer] [phone]

## 2014-04-26 ENCOUNTER — Non-Acute Institutional Stay (SKILLED_NURSING_FACILITY): Payer: No Typology Code available for payment source | Admitting: Internal Medicine

## 2014-04-26 ENCOUNTER — Encounter: Payer: Self-pay | Admitting: Internal Medicine

## 2014-04-26 DIAGNOSIS — J45902 Unspecified asthma with status asthmaticus: Secondary | ICD-10-CM

## 2014-04-26 DIAGNOSIS — I10 Essential (primary) hypertension: Secondary | ICD-10-CM

## 2014-04-26 DIAGNOSIS — J45909 Unspecified asthma, uncomplicated: Secondary | ICD-10-CM | POA: Insufficient documentation

## 2014-04-26 DIAGNOSIS — J9602 Acute respiratory failure with hypercapnia: Secondary | ICD-10-CM

## 2014-04-26 DIAGNOSIS — T782XXD Anaphylactic shock, unspecified, subsequent encounter: Secondary | ICD-10-CM

## 2014-04-26 DIAGNOSIS — Z5189 Encounter for other specified aftercare: Secondary | ICD-10-CM

## 2014-04-26 DIAGNOSIS — R000 Tachycardia, unspecified: Secondary | ICD-10-CM

## 2014-04-26 DIAGNOSIS — R410 Disorientation, unspecified: Secondary | ICD-10-CM

## 2014-04-26 DIAGNOSIS — R1319 Other dysphagia: Secondary | ICD-10-CM | POA: Insufficient documentation

## 2014-04-26 DIAGNOSIS — J9600 Acute respiratory failure, unspecified whether with hypoxia or hypercapnia: Secondary | ICD-10-CM

## 2014-04-26 DIAGNOSIS — I498 Other specified cardiac arrhythmias: Secondary | ICD-10-CM

## 2014-04-26 DIAGNOSIS — R404 Transient alteration of awareness: Secondary | ICD-10-CM

## 2014-04-26 DIAGNOSIS — J4542 Moderate persistent asthma with status asthmaticus: Secondary | ICD-10-CM

## 2014-04-26 NOTE — Progress Notes (Signed)
Patient ID: Tammie [name], female   DOB: [birth year], 53 y.o.   MRN: [id number]  Provider:  Rexene Edison. Mariea Clonts, D.O., C.M.D. Location:  [facility]   PCP: No PCP Per Patient  Code Status: full code  Allergies  Allergen Reactions  . Aspirin Anaphylaxis, Swelling and Other (See Comments)    Throat swelling   . Ibuprofen Anaphylaxis, Swelling and Other (See Comments)    Throat swelling  . Nsaids Anaphylaxis, Swelling and Other (See Comments)  . Ace Inhibitors Other (See Comments)    Angioedema  . Lipitor [Atorvastatin] Swelling and Other (See Comments)    Face, feet swell    Chief Complaint  Patient presents with  . New Admit To SNF    HPI: 53 y.o. female with h/o environmental allergies, htn, asthma, GERD, headaches, ace inhibitor induced angioedema seen for new admission to snf after acute respiratory failure from anaphylaxis after given aspirin and nitroglycerin for an episode of chest pain.  She had presented 9/11 with cough, congestion and chest pain.  After the asa and nitro, she developed increased shortness of breath and an anaphylactic reaction requiring intubation emergently.  She was treated with steroids, duonebs and oxygen.  She had delirium.  She had some difficulty with dysphagia and was on a dysphagia 3, nectar thickened liquids diet.  MRI brain did not reveal a stroke.  Here she is on mechanical soft NAS with thickened liquids.    ROS: Review of Systems  Constitutional: Negative for fever, chills and malaise/fatigue.  HENT: Negative for congestion.   Eyes: Negative for blurred vision.  Respiratory: Positive for cough, sputum production, shortness of breath and wheezing.   Cardiovascular: Negative for chest pain.  Gastrointestinal: Negative for abdominal pain, constipation, blood in stool and melena.  Genitourinary: Negative for dysuria.  Musculoskeletal: Negative for myalgias and falls.  Skin: Negative for rash.  Neurological: Positive for weakness.  Negative for dizziness and loss of consciousness.  Endo/Heme/Allergies: Positive for environmental allergies.  Psychiatric/Behavioral: Negative for memory loss.     Past Medical History  Diagnosis Date  . Hypertension   . Environmental allergies   . Asthma   . Shortness of breath   . Respiratory failure, acute 04/19/2014  . Anxiety   . GERD (gastroesophageal reflux disease)   . PPIRJJOA(416.6)    Past Surgical History  Procedure Laterality Date  . Abdominal hysterectomy     Social History:   reports that she has never smoked. She has never used smokeless tobacco. She reports that she does not drink alcohol or use illicit drugs.  No family history on file.  Medications: Patient's Medications  New Prescriptions   No medications on file  Previous Medications   ALBUTEROL (PROVENTIL HFA;VENTOLIN HFA) 108 (90 BASE) MCG/ACT INHALER    Inhale 2 puffs into the lungs every 4 (four) hours as needed for wheezing (bronchitis).    BISOPROLOL (ZEBETA) 5 MG TABLET    Take 0.5 tablets (2.5 mg total) by mouth daily.   CLONIDINE (CATAPRES - DOSED IN MG/24 HR) 0.3 MG/24HR PATCH    Place 1 patch (0.3 mg total) onto the skin once a week.   FLUTICASONE (FLONASE) 50 MCG/ACT NASAL SPRAY    Place 2 sprays into both nostrils daily.    HYDRALAZINE (APRESOLINE) 25 MG TABLET    Take 1 tablet (25 mg total) by mouth every 8 (eight) hours.  Modified Medications   No medications on file  Discontinued Medications   No medications on file  Physical Exam: Filed Vitals:   04/26/14 0922  BP: 148/88  Pulse: 84  Temp: 98.1 F (36.7 C)  Resp: 18  Height: 5\' 2"  (1.575 m)  Weight: 127 lb 6.4 oz (57.788 kg)  SpO2: 97%   Physical Exam  Constitutional: She is oriented to person, place, and time. No distress.  HENT:  Head: Normocephalic and atraumatic.  Right Ear: External ear normal.  Left Ear: External ear normal.  Nose: Nose normal.  Mouth/Throat: Oropharynx is clear and moist.  Eyes:  Conjunctivae and EOM are normal. Pupils are equal, round, and reactive to light.  Neck: Normal range of motion. Neck supple. No JVD present.  Cardiovascular: Normal rate, regular rhythm, normal heart sounds and intact distal pulses.   Pulmonary/Chest: No respiratory distress. She has wheezes. She has no rales.  Abdominal: Soft. Bowel sounds are normal. She exhibits no distension and no mass. There is no tenderness.  Musculoskeletal: Normal range of motion.  Neurological: She is alert and oriented to person, place, and time.  Skin: Skin is warm and dry.  Psychiatric: She has a normal mood and affect.     Labs reviewed: Basic Metabolic Panel:  Recent Labs  04/16/14 2120  04/17/14 1253 04/17/14 2210  04/21/14 1510 04/22/14 0342 04/25/14 1315  NA  --   < >  --   --   < > 146 143 139  K  --   < >  --   --   < > 4.2 3.8 3.9  CL  --   < >  --   --   < > 106 101 101  CO2  --   < >  --   --   < > 28 28 25   GLUCOSE  --   < >  --   --   < > 166* 100* 128*  BUN  --   < >  --   --   < > 29* 24* 17  CREATININE  --   < >  --   --   < > 0.96 0.91 0.98  CALCIUM  --   < >  --   --   < > 10.0 9.3 9.3  MG 2.5  --  2.3 2.4  --   --   --   --   PHOS 3.6  --  3.7 3.0  --   --   --   --   < > = values in this interval not displayed. Liver Function Tests:  Recent Labs  04/15/14 1359 04/17/14 0343  AST 30 21  ALT 23 16  ALKPHOS 112 72  BILITOT 1.1 <0.2*  PROT 7.4 6.4  ALBUMIN 3.5 2.9*  CBC:  Recent Labs  04/15/14 1359  04/17/14 0343 04/18/14 0320 04/20/14 0548 04/22/14 0342  WBC 8.7  < > 13.5* 10.6* 11.3* 9.5  NEUTROABS 3.6  --  12.4* 9.9*  --   --   HGB 14.1  < > 12.2 10.0* 14.4 14.6  HCT 40.8  < > 36.7 27.2* 43.3 44.0  MCV 81.6  < > 83.0 88.6 83.1 82.9  PLT 318  < > 287 210 330 257  < > = values in this interval not displayed. Cardiac Enzymes:  Recent Labs  04/15/14 1359 04/15/14 2000 04/16/14 0338  TROPONINI <0.30 <0.30 <0.30  CBG:  Recent Labs  04/18/14 1343  04/18/14 1638 04/18/14 2121  GLUCAP 128* 142* 166*   Imaging and Procedures: Dg Chest 1 View  04/20/2014  CLINICAL DATA: Respiratory failure. EXAM: CHEST - 1 VIEW COMPARISON: 04/19/2014 FINDINGS: Lungs are hypoinflated without consolidation or effusion. Cardiomediastinal silhouette and remainder of the exam is unchanged. IMPRESSION: Hypoinflation without acute cardiopulmonary disease. Electronically Signed By: Marin Olp M.D. On: 04/20/2014 08:43   Ct Angio Chest Pe W/cm &/or Wo Cm  04/16/2014 CLINICAL DATA: Cough, congestion, shortness of breath. Patient on ventilator. Possible anaphylaxis. EXAM: CT ANGIOGRAPHY CHEST WITH CONTRAST TECHNIQUE: Multidetector CT imaging of the chest was performed using the standard protocol during bolus administration of intravenous contrast. Multiplanar CT image reconstructions and MIPs were obtained to evaluate the vascular anatomy. CONTRAST: 27mL OMNIPAQUE IOHEXOL 350 MG/ML SOLN COMPARISON: Chest radiograph 04/16/2014 FINDINGS: Endotracheal tube is in satisfactory position. An enteric tube can be followed into the stomach and coils in the fundus, but its distal tip is not included on this exam. The heart is moderately enlarged and all 4 chambers of the heart appear slightly dilated. There is some reflux of contrast into the inferior vena cava and hepatic veins. Negative a pleural the of the. Central pulmonary arteries are normal in size. Pulmonary arterial tree is well opacified with contrast. No focal filling defects are identified to suggest pulmonary embolism. Lung windows demonstrate slight respiratory motion. There is mild dependent atelectasis in both lower lobes. No airspace disease. The trachea and mainstem bronchi are patent. Images of the upper abdomen somewhat limited by early arterial phase imaging. 7 mm low-density lesion in the left lobe of the liver measures water density, compatible with a small cyst. There is some endplate sclerosis at the C6-C7 level.  Thoracic spine vertebral bodies are normal in height and alignment. No suspicious osseous lesion or fracture. Review of the MIP images confirms the above findings. IMPRESSION: 1. Negative for pulmonary embolism. 2. Cardiomegaly with suggestion of mild dilatation of all 4 chambers of the heart. There is some reflux of contrast into the inferior vena cava and hepatic veins, which can be seen in the setting of elevated right heart pressures. 3. Mild atelectasis in both lower lobes of lungs. Otherwise, the lungs are clear. 4. An enteric tube can be followed into the stomach, but its distal tip is not included on the study. Electronically Signed By: Curlene Dolphin M.D. On: 04/16/2014 13:37   Mr Brain Wo Contrast  04/22/2014 CLINICAL DATA: Acute encephalopathy. Headaches and dizziness. Question CVA. EXAM: MRI HEAD WITHOUT CONTRAST TECHNIQUE: Multiplanar, multiecho pulse sequences of the brain and surrounding structures were obtained without intravenous contrast. COMPARISON: None available. FINDINGS: The diffusion-weighted images demonstrate no evidence for acute or subacute infarction. The study is moderately degraded by patient motion. From the cerebellar tonsils extend 4 mm below the foramen magnum in the midline. A appears somewhat pointed on the right. Periventricular and subcortical white matter changes are greater than expected for age. No hemorrhage or mass lesion is present. The ventricles are of normal size. There is no significant atrophy. No significant extraaxial fluid collection is present. Flow is present in the major intracranial arteries. There is diffuse opacification of paranasal sinuses. Fluid levels are present in the right sphenoid sinus and left maxillary sinus. The frontal sinuses are not pneumatized. The mastoid air cells are clear. Endplate degenerative changes are associated with kyphosis of the upper cervical spine. IMPRESSION: 1. No acute infarct. 2. Mild periventricular and subcortical white  matter changes bilaterally are somewhat advanced for age. The finding is nonspecific but can be seen in the setting of chronic microvascular ischemia, a demyelinating process such as multiple sclerosis,  vasculitis, complicated migraine headaches, or as the sequelae of a prior infectious or inflammatory process. 3. Borderline Chiari malformation. 4. Extensive sinus disease with fluid levels in the left maxillary sinus and right sphenoid sinus. 5. Degenerative changes of the upper cervical spine. Electronically Signed By: Lawrence Santiago M.D. On: 04/22/2014 16:40   Dg Chest Port 1 View  04/19/2014 CLINICAL DATA: Difficulty breathing EXAM: PORTABLE CHEST - 1 VIEW COMPARISON: April 18, 2014 FINDINGS: There is no edema or consolidation. Heart is borderline enlarged with pulmonary vascularity within normal limits. No adenopathy. No bone lesions. IMPRESSION: No edema or consolidation. Mild cardiac prominence. Electronically Signed By: Lowella Grip M.D. On: 04/19/2014 07:06   Dg Chest Port 1 View  04/18/2014 CLINICAL DATA: Respiratory failure. EXAM: PORTABLE CHEST - 1 VIEW COMPARISON: 04/18/2014 earlier. FINDINGS: The lungs are somewhat hypoinflated without focal consolidation or effusion. Cardiomediastinal silhouette and remainder the exam is unchanged. IMPRESSION: Hypoinflation without acute cardiopulmonary disease. Electronically Signed By: Marin Olp M.D. On: 04/18/2014 21:35   Dg Chest Port 1 View  04/18/2014 CLINICAL DATA: Assess endotracheal and support tube placement EXAM: PORTABLE CHEST - 1 VIEW COMPARISON: Portable chest x-ray of April 17, 2014 FINDINGS: The endotracheal tube tip lies 3 cm above the crotch of the carina. The esophagogastric tube tip projects off the inferior margin of the study. The lungs are reasonably well inflated. Minimal prominence of the interstitial markings on the left is noted. The cardiopericardial silhouette is mildly enlarged. The pulmonary vascularity is prominent  centrally especially on the left. There is no pleural effusion. The observed bony thorax is unremarkable. IMPRESSION: The support tube positioning is good. Mild prominence of the pulmonary interstitial markings on the left may reflect low-grade asymmetric interstitial edema. There is no evidence of pneumonia. Electronically Signed By: David Martinique On: 04/18/2014 06:56   Dg Chest Port 1 View  04/17/2014 CLINICAL DATA: Evaluate for pulmonary edema. EXAM: PORTABLE CHEST - 1 VIEW COMPARISON: Chest x-ray 04/16/2014. FINDINGS: An endotracheal tube is in place with tip 3.3 cm above the carina. A nasogastric tube is seen extending into the stomach, however, the tip of the nasogastric tube extends below the lower margin of the image. Lung volumes are slightly low and there are some linear bibasilar opacities most compatible with mild subsegmental atelectasis. No confluent consolidative airspace disease. No pleural effusions. Mild crowding of the pulmonary vasculature, accentuated by low lung volumes, without frank pulmonary edema. Mild cardiomegaly is unchanged. Upper mediastinal contours are within normal limits. IMPRESSION: 1. Support apparatus, as above. 2. Low lung volumes with mild bibasilar subsegmental atelectasis. 3. Cardiomegaly. 4. No evidence of pulmonary edema. Electronically Signed By: Vinnie Langton M.D. On: 04/17/2014 07:22   Dg Chest Port 1 View  04/16/2014 CLINICAL DATA: Evaluate for airspace disease and evaluate endotracheal tube EXAM: PORTABLE CHEST - 1 VIEW COMPARISON: 04/15/2014 FINDINGS: Endotracheal tube 3.2 cm above the carina. NG tube crosses into the stomach. Mild cardiac enlargement stable. There is stable mild vascular congestion. No evidence of consolidation or pulmonary edema. IMPRESSION: No evidence of pulmonary edema. Endotracheal tube as described. Electronically Signed By: Skipper Cliche M.D. On: 04/16/2014 07:38   Dg Chest Portable 1 View  04/15/2014 CLINICAL DATA: Shortness of  breath EXAM: PORTABLE CHEST - 1 VIEW COMPARISON: 09/19/2013 FINDINGS: Endotracheal tube is noted 12 mm above the carina. This could be withdrawn 1-2 cm. The cardiac shadow is stable. A nasogastric catheter seen coiled within the stomach. The lungs are well-aerated without focal infiltrate or sizable effusion. IMPRESSION: Endotracheal tube just above  the carina. This could be withdrawn 1-2 cm. Electronically Signed By: Inez Catalina M.D. On: 04/15/2014 13:49   Dg Abd Portable 1v  04/15/2014 CLINICAL DATA: Orogastric tube placement. EXAM: PORTABLE ABDOMEN - 1 VIEW COMPARISON: None. FINDINGS: Gaseous distention of the stomach is present. There is an enteric tube with the redundant loop near the cardia of the stomach. The tip of the tube is in the mid gastric fundus. IMPRESSION: Enteric tube tip in the mid gastric fundus. Electronically Signed By: Dereck Ligas M.D. On: 04/15/2014 19:36   Dg Swallowing Func-speech Pathology  04/25/2014 Eden Emms, Joyce 04/25/2014 2:01 PM Objective Swallowing Evaluation: Modified Barium Swallowing Study Patient Details Name: Tammie [name] MRN: [id number] Date of Birth: [birth year] Today's Date: 04/25/2014 Time: 0940-1005 SLP Time Calculation (min): 25 min Past Medical History: Past Medical History Diagnosis Date . Hypertension . Environmental allergies . Asthma . Shortness of breath . Respiratory failure, acute 04/19/2014 . Anxiety . GERD (gastroesophageal reflux disease) . [id number](299.2) Past Surgical History: Past Surgical History Procedure Laterality Date . Abdominal hysterectomy HPI: 53 y/o female admitted to [facility] on 9/11 after being seen for cough, congestion and chest pain. Medicated with ASA & NTG. Pt developed increased SOB & anaphylactic reaction requiring emergent intubation; extubated 9/14. Nursing reports pt was given water to drink, followed by a 30 min coughing spell during which pt desaturated, bipap was initiated with good results. Now with encephalopathy,  agitation; NPO. Assessment / Plan / Recommendation Clinical Impression Dysphagia Diagnosis: Mild oral phase dysphagia;Moderate pharyngeal phase dysphagia Clinical impression: Pt demonstrates a mild oral stage and moderate pharyngeal phase dysphagia with both sensory and motor components (sensory>motor). Her swallow is consistently delayed to the valleculae with instances of penetration before the swallow, and aspiration (cough)both before and during the swallow with trials of thin liquids. Decreased overall coordination of the swallow and decreased contraction of the posterior pharyngeal are evident. SLP cued for chin tuck which did not reduce aspiration and appeared to increase the amout of space between the base of tongue and the posterior pharyngeal wall, rather than reducing it as expected. Pt also demonstrated idiosyncratic compensation (neck extension) which increased the veolcity of the blous resulting in immediate aspiration. In addition, suspect respiratory/swallow discoordination resulting in possible premature abduction of vocal cords during the swallow increasing aspiration risk. Verbal cueing provided for neutral head position allowed for smaller sips of nectar without signs of penetration/aspiration. Recommend dysphagia 3 (mechanical soft) with nectar thin liquids. SLP to follow up with training for compensatory strategies and diet advancement as appropriate. Treatment Recommendation Therapy as outlined in treatment plan below Diet Recommendation Dysphagia 3 (Mechanical Soft);Nectar-thick liquid Liquid Administration via: Cup;No straw Medication Administration: Whole meds with puree Supervision: Patient able to self feed;Full supervision/cueing for compensatory strategies Compensations: Slow rate;Small sips/bites;Clear throat intermittently Postural Changes and/or Swallow Maneuvers: Seated upright 90 degrees Other Recommendations Oral Care Recommendations: Oral care BID Follow Up Recommendations  Skilled Nursing facility Frequency and Duration min 2x/week 2 weeks Pertinent Vitals/Pain No indications of pain Reason for Referral Objectively evaluate swallowing function Oral Phase Oral Preparation/Oral Phase Oral Phase: WFL Pharyngeal Phase Pharyngeal Phase Pharyngeal Phase: Impaired Pharyngeal - Nectar Pharyngeal - Nectar Cup: Delayed swallow initiation;Premature spillage to valleculae;Reduced pharyngeal peristalsis Pharyngeal - Thin Pharyngeal - Thin Cup: Delayed swallow initiation;Premature spillage to valleculae;Reduced airway/laryngeal closure;Penetration/Aspiration before swallow;Penetration/Aspiration during swallow;Compensatory strategies attempted (Comment);Significant aspiration (Amount) (chin tuck) Penetration/Aspiration details (thin cup): Material enters airway, passes BELOW cords and not ejected out despite cough attempt by patient Pharyngeal - Solids Pharyngeal -  Regular: Delayed swallow initiation;Premature spillage to valleculae;Premature spillage to pyriform sinuses Cervical Esophageal Phase Cervical Esophageal Phase Cervical Esophageal Phase: [name] 04/25/2014, 1:46 PM   Assessment/Plan 1. Anaphylaxis, subsequent encounter -felt to be due to aspirin -also has angioedema from ace and lipitor allergy -has recovered  2. Delirium -improved  3. Acute respiratory failure with hypercapnia -resolved -will need nebs at d/c home--albuterol  4. Essential hypertension -needs her Rxs at d/c home--clonidine and bisoprolol and hydralazine  5. Asthma, chronic, moderate persistent, with status asthmaticus -improved, cont albuterol inhaler prn  6.  Sinus tachycardia -noted, but improved, monitor, beta blocker she's on should help  7.  Dysphagia -cont modified diet and working with speech therapy;  Hopefully she can go home on a regular diet  Functional status: independent  Family/ staff Communication: seen with unit supervisor *Needs pcp, nebs, and bp meds on d/c  home*

## 2014-04-29 ENCOUNTER — Encounter: Payer: Self-pay | Admitting: Internal Medicine

## 2014-04-29 ENCOUNTER — Non-Acute Institutional Stay (SKILLED_NURSING_FACILITY): Payer: No Typology Code available for payment source | Admitting: Internal Medicine

## 2014-04-29 DIAGNOSIS — R1319 Other dysphagia: Secondary | ICD-10-CM

## 2014-04-29 DIAGNOSIS — I10 Essential (primary) hypertension: Secondary | ICD-10-CM

## 2014-04-29 DIAGNOSIS — R5381 Other malaise: Secondary | ICD-10-CM

## 2014-04-29 NOTE — Progress Notes (Signed)
PULMONARY / CRITICAL CARE MEDICINE   Name: Tammie [name] MRN: [id number] DOB: Aug 17, 1960    ADMISSION DATE:  04/15/2014  REFERRING MD :  EDP   CHIEF COMPLAINT:  Acute Respiratory Failure, Anaphylaxis   INITIAL PRESENTATION: 53 y/o [name] admitted to [facility] on 9/11 after being seen for cough, congestion and chest pain.  Medicated with ASA & NTG.  Pt developed increased SOB & Anaphylactic reaction requiring emergent intubation. PCCM called for admission.   STUDIES:  9/11 ECHO >>diast 1, EF 65%-70%, some lvh 9/12 CT>>>no PE  SIGNIFICANT EVENTS: 9/11  Admit with cough, congestion, chest pain, HTN , anaphylaxis with ASA (was not listed as an allergy) 9/12 trop neg 9/14 Extubated. Agitated with hypertension and tachycardia post extubation. Precedex initiated. Much improved. Clonidine ordered 9/14 sudden desat requiring bipap  SUBJECTIVE:   Pt on bipap, no apparent distress, nursing reports was given water to drink last pm (2000), this was followed by a 30 min coughing spell during which pt desaturated, Elink was contacted and bipap was initiated with good results and lasix 20mg  was administered  VITAL SIGNS: Temp:  [98 F (36.7 C)-98.5 F (36.9 C)] 98.4 F (36.9 C) (09/15 0700) Pulse Rate:  [75-105] 75 (09/15 0726) Resp:  [19-45] 24 (09/15 0600) BP: (88-245)/(55-178) 134/79 mmHg (09/15 0726) SpO2:  [78 %-100 %] 100 % (09/15 0726) FiO2 (%):  [36 %-100 %] 50 % (09/15 0729) Weight:  [139 lb 15.9 oz (63.5 kg)] 139 lb 15.9 oz (63.5 kg) (09/15 0500)  HEMODYNAMICS:    VENTILATOR SETTINGS: Vent Mode:  [-] BIPAP FiO2 (%):  [36 %-100 %] 50 % Set Rate:  [16 bmp] 16 bmp PEEP:  [6 cmH20] 6 cmH20 Pressure Support:  [14 cmH20] 14 cmH20  INTAKE / OUTPUT:  Intake/Output Summary (Last 24 hours) at 04/19/14 0803 Last data filed at 04/19/14 0700  Gross per 24 hour  Intake 420.84 ml  Output   3080 ml  Net -2659.16 ml    PHYSICAL EXAMINATION: General: wdwn female on bipap in NAD Neuro:  No  focal deficits, responds to commands HEENT:  NAD Cardiovascular:  RRR  Lungs:   Coarse, even respirations, non-labored on bipap Abdomen:  Soft, +BS Ext: no edema Skin:  Warm/dry  LABS:  CBC  Recent Labs Lab 04/16/14 0342 04/17/14 0343 04/18/14 0320  WBC 8.8 13.5* 10.6*  HGB 12.1 12.2 10.0*  HCT 34.4* 36.7 27.2*  PLT 277 287 210   Coag's No results found for this basename: APTT, INR,  in the last 168 hours  BMET  Recent Labs Lab 04/18/14 0539 04/18/14 1055 04/19/14 0324  NA 144 148* 151*  K 6.1* 4.9 4.2  CL 112 109 112  CO2 20 23 28   BUN 20 22 28*  CREATININE 0.73 0.75 0.90  GLUCOSE 156* 114* 124*   Electrolytes  Recent Labs Lab 04/16/14 2120  04/17/14 1253 04/17/14 2210  04/18/14 0539 04/18/14 1055 04/19/14 0324  CALCIUM  --   < >  --   --   < > 8.8 9.5 9.3  MG 2.5  --  2.3 2.4  --   --   --   --   PHOS 3.6  --  3.7 3.0  --   --   --   --   < > = values in this interval not displayed. Sepsis Markers  Recent Labs Lab 04/16/14 0908 04/17/14 0343 04/18/14 0539  PROCALCITON 1.56 1.04 0.45    ABG  Recent Labs Lab 04/15/14 1607 04/16/14  0405 04/18/14 2119  PHART 7.426 7.437 7.337*  PCO2ART 42.3 33.4* 53.9*  PO2ART 246.0* 124.0* 73.4*   Liver Enzymes  Recent Labs Lab 04/15/14 1359 04/17/14 0343  AST 30 21  ALT 23 16  ALKPHOS 112 72  BILITOT 1.1 <0.2*  ALBUMIN 3.5 2.9*   Cardiac Enzymes  Recent Labs Lab 04/15/14 1359 04/15/14 2000 04/16/14 0338  TROPONINI <0.30 <0.30 <0.30  PROBNP 26.9  --   --    Glucose  Recent Labs Lab 04/18/14 0006 04/18/14 0441 04/18/14 0717 04/18/14 1343 04/18/14 1638 04/18/14 2121  GLUCAP 135* 155* 161* 128* 142* 166*    Imaging Dg Chest Port 1 View  04/18/2014   CLINICAL DATA:  Respiratory failure.  EXAM: PORTABLE CHEST - 1 VIEW  COMPARISON:  04/18/2014 earlier.  FINDINGS: The lungs are somewhat hypoinflated without focal consolidation or effusion. Cardiomediastinal silhouette and  remainder the exam is unchanged.  IMPRESSION: Hypoinflation without acute cardiopulmonary disease.   Electronically Signed   By: Marin Olp M.D.   On: 04/18/2014 21:35   Dg Chest Port 1 View  04/18/2014   CLINICAL DATA:  Assess endotracheal and support tube placement  EXAM: PORTABLE CHEST - 1 VIEW  COMPARISON:  Portable chest x-ray of April 17, 2014  FINDINGS: The endotracheal tube tip lies 3 cm above the crotch of the carina. The esophagogastric tube tip projects off the inferior margin of the study. The lungs are reasonably well inflated. Minimal prominence of the interstitial markings on the left is noted. The cardiopericardial silhouette is mildly enlarged. The pulmonary vascularity is prominent centrally especially on the left. There is no pleural effusion. The observed bony thorax is unremarkable.  IMPRESSION: The support tube positioning is good. Mild prominence of the pulmonary interstitial markings on the left may reflect low-grade asymmetric interstitial edema. There is no evidence of pneumonia.   Electronically Signed   By: David  Martin[place]   On: 04/18/2014 06:56   CXR: Slight generalized haziness on L , some int prom  ASSESSMENT / PLAN:  PULMONARY ETT 9/11 >> 9/14 A: Acute Respiratory Failure Respiratory acidosis Status asthmaticus - resolved P:   Interrupt bipap assess clinical status, may require further q4h, if does not, will not require ICU Monitor ABG Supp O2 as needed Cont nebulized BDs nebulized steroids Taper systemic steroids (rapidly if possible, given agitation) Consider leukotriene inhibitor given reported hx of nasal polyps and ASA sens - once off steroids CONTROL AFTERLOAD as main issue lasix  CARDIOVASCULAR CVL A:  Severe hypertension Sinus tachycardia P:  PRN metoprolol to maintain HR < 115/min, limit wheezing for now PRN hydralazine to maintain SBP < 170 mmHg Initiate clonidine 9/14 Add hydralazine now 25 q8h  RENAL A:   Hypernatremia AKI (inc  BUN and Cr), contrast recieved P:   Monitor BMET am Monitor I/Os Encourage water intake Recheck BMET this afternoon Avoid ACEI and ARBs Reduce lasix   GASTROINTESTINAL A:   Post extubation dysphagia P:   SUP: N/I post extubation NPO Swallow study  HEMATOLOGIC A:   No acute issues P:  DVT px: SQ heparin Monitor CBC intermittently Transfuse per usual ICU guidelines ambulate  INFECTIOUS A:   No acute infectious issues identified Minimally elevated PCT (decreasing) P:   BCx2 9/11 >> NEG UA/UC 9/11 >> NEG Sputum 9/11 >> NOF HIV 9/11 >>   NEG Abx: Rocephin, start date 9/11>>>9/13 Azithro, start date 9/11 >> 9/14  ENDOCRINE A:   Hyperglycemia, steroid induced - mild P:   Taper steroids  further DC SSI Monitor glu on chem panels Resume SSI for glu > 180, even closer with d5 added  NEUROLOGIC A:   Acute Encephalopathy Severe agitation P:   RASS goal: 0 Dex initiated 9/14 Begin clonidine and taper dex off as tolerated -off PT order  TODAY'S SUMMARY: BIPAP, off precedex, abg, may go to tele?, lasix reduction.  To TR[facility] 9/16 am 0700  I have personally obtained a history, examined the patient, evaluated laboratory and imaging results, formulated the assessment and plan and placed orders.  CRITICAL CARE: The patient is critically ill with multiple organ systems failure and requires high complexity decision making for assessment and support, frequent evaluation and titration of therapies, application of advanced monitoring technologies and extensive interpretation of multiple databases. Critical Care Time devoted to patient care services described in this note is 30 minutes.   Lavon Paganini. Titus Mould, MD, [place] Pgr: [facility]

## 2014-04-29 NOTE — Progress Notes (Addendum)
Patient ID: Devetta Hagenow, female   DOB: 11-05-1960, 53 y.o.   MRN: [id number]     Facility: [facility]  Chief complaint- discharge visit  Allergies reviewed  HPI 53 Y/o patient is seen today for discharge visit. Patient was here for short term rehabilitation and has worked with therapy team. She had acute respiratory failure in setting of anaphylactic reaction requiring intubation. She is stable to be discharged home. She is seen in her room today, in no distress and denies any complaints She is on mechanical soft diet and nectar thick liquids  Review of system Constitutional: Negative for fever, chills HENT: Negative for congestion.  Respiratory: Negative for cough, sputum production, shortness of breath and wheezing.   Cardiovascular: Negative for chest pain, palpitations, leg swelling.  Gastrointestinal: Negative for heartburn, nausea, vomiting, abdominal pain, constipation Genitourinary: Negative for dysuria Musculoskeletal: Negative for back pain, falls Skin: Negative for itching and rash.  Neurological: Negative for dizziness   Medication reviewed. See [facility]   Medication List       This list is accurate as of: 04/29/14  9:54 AM.  Always use your most recent med list.               albuterol 108 (90 BASE) MCG/ACT inhaler  Commonly known as:  PROVENTIL HFA;VENTOLIN HFA  Inhale 2 puffs into the lungs every 4 (four) hours as needed for wheezing (bronchitis).     bisoprolol 5 MG tablet  Commonly known as:  ZEBETA  Take 0.5 tablets (2.5 mg total) by mouth daily.     cloNIDine 0.3 mg/24hr patch  Commonly known as:  CATAPRES - Dosed in mg/24 hr  Place 1 patch (0.3 mg total) onto the skin once a week.     fluticasone 50 MCG/ACT nasal spray  Commonly known as:  FLONASE  Place 2 sprays into both nostrils daily.     hydrALAZINE 25 MG tablet  Commonly known as:  APRESOLINE  Take 1 tablet (25 mg total) by mouth every 8 (eight) hours.         Physical exam BP 107/64  Pulse 74  Temp(Src) 98.5 F (36.9 C)  Resp 18  Wt 128 lb (58.06 kg)  SpO2 96%  General- female in no acute distress Head- atraumatic, normocephalic Cardiovascular- normal s1,s2, no murmurs Respiratory- bilateral clear to auscultation Abdomen- bowel sounds present, soft, non tender Musculoskeletal- able to move all 4 extremities, no use of assistive device Neurological- no focal deficit Psychiatry- alert and oriented to person, place and time, normal mood and affect   Assessment/plan  Patient is stable to be discharged home with physical therapy to continue gait training and strengthening and speech therapy to help with safety with her swallowing. Will need her modified barium swallow done as outpatient to help assess for advancing her diet  Continue her weekly clonidine patch and daily zebeta with hydralazine.  Home health services: PT, speech therapy  DME required: none  PCP follow up: 05/26/14 with Sherrie Mustache  30 days supply of prescription medications provided  Plan of care discussed with patient and verbalizes understanding this. Reviewed care plan with nursing staff

## 2014-05-26 ENCOUNTER — Ambulatory Visit (INDEPENDENT_AMBULATORY_CARE_PROVIDER_SITE_OTHER): Payer: No Typology Code available for payment source | Admitting: Nurse Practitioner

## 2014-05-26 ENCOUNTER — Encounter: Payer: Self-pay | Admitting: Nurse Practitioner

## 2014-05-26 VITALS — BP 158/90 | HR 82 | Temp 97.8°F | Resp 10 | Ht <= 58 in | Wt 139.6 lb

## 2014-05-26 DIAGNOSIS — F411 Generalized anxiety disorder: Secondary | ICD-10-CM

## 2014-05-26 DIAGNOSIS — Z23 Encounter for immunization: Secondary | ICD-10-CM

## 2014-05-26 DIAGNOSIS — R609 Edema, unspecified: Secondary | ICD-10-CM

## 2014-05-26 DIAGNOSIS — J4542 Moderate persistent asthma with status asthmaticus: Secondary | ICD-10-CM

## 2014-05-26 DIAGNOSIS — I10 Essential (primary) hypertension: Secondary | ICD-10-CM

## 2014-05-26 MED ORDER — AMLODIPINE BESYLATE 5 MG PO TABS: 5.0000 mg | ORAL_TABLET | Freq: Every day | ORAL | Status: DC

## 2014-05-26 MED ORDER — MONTELUKAST SODIUM 10 MG PO TABS: 10.0000 mg | ORAL_TABLET | Freq: Every day | ORAL | Status: DC

## 2014-05-26 MED ORDER — FLUTICASONE-SALMETEROL 100-50 MCG/DOSE IN AEPB: 1.0000 | INHALATION_SPRAY | Freq: Two times a day (BID) | RESPIRATORY_TRACT | Status: DC

## 2014-05-26 NOTE — Patient Instructions (Signed)
Will start Norvac for blood pressure  ASTHMA - Start Advair via Inhalation routinely twice a day Use albuterol only As NEEDED  Start Singulair daily by mouth   Follow up in 2 weeks for blood pressure and asthma check Follow up in 6 weeks for Physical (wellness) exam

## 2014-05-26 NOTE — Progress Notes (Signed)
Patient ID: Tammie [name], female   DOB: [birth year], 53 y.o.   MRN: [id number]    Allergies  Allergen Reactions  . Aspirin Anaphylaxis, Swelling and Other (See Comments)    Throat swelling   . Ibuprofen Anaphylaxis, Swelling and Other (See Comments)    Throat swelling  . Nsaids Anaphylaxis, Swelling and Other (See Comments)  . Ace Inhibitors Other (See Comments)    Angioedema  . Lipitor [Atorvastatin] Swelling and Other (See Comments)    Face, feet swell    Chief Complaint  Patient presents with  . Establish Care    New patient establish care: High blood pressure and asthma management   . Immunizations    Flu Vaccine     HPI: Patient is a 53 y.o. female seen in the office today to establish care. Pt was previously at [facility] for rehab and was seen by Dr Bubba Camp and Dr Mariea Clonts. On discharge she was referred to [facility] for Primary Care. Previously had no routine care, shortly went to another clinic does not know the name Pt with asthma, currently using albuterol inhaler twice daily everyday Non-smoker Does not work Lives alone Was raised in [place], moved to [place] with her parents when her grandparents got sick.  Highest level of education was high school   Review of Systems:   Review of Systems  Constitutional: Negative for fever, chills and weight loss.  HENT: Positive for congestion (chronic, does not use flonase routinely). Negative for sore throat.   Respiratory: Negative for cough, shortness of breath and wheezing.        Frequently feels like "can get enough air"  Cardiovascular: Positive for leg swelling (swelling goes and comes, currently better). Negative for chest pain and palpitations.  Gastrointestinal: Positive for constipation (off and on constipation). Negative for heartburn, nausea, vomiting and diarrhea.  Genitourinary: Negative for dysuria and urgency.  Musculoskeletal: Negative for falls, joint pain and myalgias.  Skin: Negative for itching and  rash.  Neurological: Negative for dizziness, seizures, weakness and headaches.  Psychiatric/Behavioral: Negative for depression. The patient is nervous/anxious and has insomnia.     Past Medical History  Diagnosis Date  . Hypertension   . Environmental allergies   . Asthma   . Shortness of breath   . Respiratory failure, acute 04/19/2014  . Anxiety   . GERD (gastroesophageal reflux disease)   . XTGGYIRS(854.6)    Past Surgical History  Procedure Laterality Date  . Abdominal hysterectomy  [facility]    Social History:   reports that she has never smoked. She has never used smokeless tobacco. She reports that she does not drink alcohol or use illicit drugs.  Family History  Problem Relation Age of Onset  . Heart attack Father   . Heart attack Mother   . High blood pressure Son   . Cancer Maternal Grandmother   . Heart disease Paternal Grandmother   . Asthma Other     Great paternal grandfather   . Autism Son   . Diabetes Sister   . High blood pressure Brother   . Lung cancer Sister   . High blood pressure Sister   . Asthma Other     [place]     Medications: Patient's Medications  New Prescriptions   No medications on file  Previous Medications   ALBUTEROL (PROVENTIL HFA;VENTOLIN HFA) 108 (90 BASE) MCG/ACT INHALER    Inhale 2 puffs into the lungs every 4 (four) hours as needed  for wheezing (bronchitis).    BISOPROLOL (ZEBETA) 5 MG TABLET    Take 0.5 tablets (2.5 mg total) by mouth daily.   CETIRIZINE (ZYRTEC) 10 MG TABLET    Take 10 mg by mouth daily.   FLUTICASONE (FLONASE) 50 MCG/ACT NASAL SPRAY    Place 2 sprays into both nostrils daily.    HYDRALAZINE (APRESOLINE) 25 MG TABLET    Take 1 tablet (25 mg total) by mouth every 8 (eight) hours.  Modified Medications   No medications on file  Discontinued Medications   No medications on file     Physical Exam:  Filed Vitals:   05/26/14 0900  BP: 158/90  Pulse: 82  Temp: 97.8 F (36.6 C)    TempSrc: Oral  Resp: 10  Height: 4' 9.06" (1.449 m)  Weight: 139 lb 9.6 oz (63.322 kg)  SpO2: 95%    Physical Exam  Constitutional: She is oriented to person, place, and time. She appears well-developed and well-nourished. No distress.  HENT:  Head: Normocephalic and atraumatic.  Left Ear: External ear normal.  Nose: Nose normal.  Mouth/Throat: Oropharynx is clear and moist. No oropharyngeal exudate.  Eyes: Conjunctivae and EOM are normal. Pupils are equal, round, and reactive to light.  Neck: Normal range of motion. Neck supple.  Cardiovascular: Normal rate, regular rhythm and normal heart sounds.   Respiratory: Effort normal. No respiratory distress. She has decreased breath sounds. She has no wheezes.  GI: Soft. Bowel sounds are normal.  Musculoskeletal: She exhibits no edema and no tenderness.  Lymphadenopathy:    She has no cervical adenopathy.  Neurological: She is alert and oriented to person, place, and time.  Skin: Skin is warm and dry. She is not diaphoretic.  Psychiatric:  Tearful during exam talking about son (who she rarely sees)      Labs reviewed: Basic Metabolic Panel:  Recent Labs  04/16/14 2120  04/17/14 1253 04/17/14 2210  04/21/14 1510 04/22/14 0342 04/25/14 1315  NA  --   < >  --   --   < > 146 143 139  K  --   < >  --   --   < > 4.2 3.8 3.9  CL  --   < >  --   --   < > 106 101 101  CO2  --   < >  --   --   < > 28 28 25   GLUCOSE  --   < >  --   --   < > 166* 100* 128*  BUN  --   < >  --   --   < > 29* 24* 17  CREATININE  --   < >  --   --   < > 0.96 0.91 0.98  CALCIUM  --   < >  --   --   < > 10.0 9.3 9.3  MG 2.5  --  2.3 2.4  --   --   --   --   PHOS 3.6  --  3.7 3.0  --   --   --   --   < > = values in this interval not displayed. Liver Function Tests:  Recent Labs  04/15/14 1359 04/17/14 0343  AST 30 21  ALT 23 16  ALKPHOS 112 72  BILITOT 1.1 <0.2*  PROT 7.4 6.4  ALBUMIN 3.5 2.9*   No results found for this basename: LIPASE,  AMYLASE,  in the last 8760 hours No  results found for this basename: AMMONIA,  in the last 8760 hours CBC:  Recent Labs  04/15/14 1359  04/17/14 0343 04/18/14 0320 04/20/14 0548 04/22/14 0342  WBC 8.7  < > 13.5* 10.6* 11.3* 9.5  NEUTROABS 3.6  --  12.4* 9.9*  --   --   HGB 14.1  < > 12.2 10.0* 14.4 14.6  HCT 40.8  < > 36.7 27.2* 43.3 44.0  MCV 81.6  < > 83.0 88.6 83.1 82.9  PLT 318  < > 287 210 330 257  < > = values in this interval not displayed. Lipid Panel:  Recent Labs  04/15/14 2000  TRIG 120   TSH: No results found for this basename: TSH,  in the last 8760 hours A1C: No results found for this basename: HGBA1C     Assessment/Plan  1. Encounter for immunization Flu vaccine given   2. Essential hypertension -not at goal -allergic to ACE inhibitors  - amLODipine (NORVASC) 5 MG tablet; Take 1 tablet (5 mg total) by mouth daily.  Dispense: 90 tablet; Refill: 3 -conts hydralazine, zebeta   3. Asthma, chronic, moderate persistent, with status asthmaticus -poorly controlled, against seeing pulmonary at this time - montelukast (SINGULAIR) 10 MG tablet; Take 1 tablet (10 mg total) by mouth at bedtime.  Dispense: 30 tablet; Refill: 3 - Fluticasone-Salmeterol (ADVAIR) 100-50 MCG/DOSE AEPB; Inhale 1 puff into the lungs 2 (two) times daily.  Dispense: 1 each; Refill: 3 -cont albuterol PRN, education given on when to use  4. Edema - Compression stockings  5. Anxiety -anxiety noted over son with disability who does not live with her, only get special visits, doctors appt and financial situation.   -will cont to follow this  Follow up in 2 weeks regarding blood pressure and asthma Follow up in 6 weeks for EV

## 2014-06-12 ENCOUNTER — Other Ambulatory Visit: Payer: Self-pay | Admitting: Internal Medicine

## 2014-06-16 ENCOUNTER — Encounter: Payer: Self-pay | Admitting: Nurse Practitioner

## 2014-06-16 ENCOUNTER — Ambulatory Visit (INDEPENDENT_AMBULATORY_CARE_PROVIDER_SITE_OTHER): Payer: No Typology Code available for payment source | Admitting: Nurse Practitioner

## 2014-06-16 VITALS — BP 126/84 | HR 79 | Temp 97.6°F | Resp 12 | Wt 141.0 lb

## 2014-06-16 DIAGNOSIS — I10 Essential (primary) hypertension: Secondary | ICD-10-CM

## 2014-06-16 DIAGNOSIS — J4542 Moderate persistent asthma with status asthmaticus: Secondary | ICD-10-CM

## 2014-06-16 DIAGNOSIS — R609 Edema, unspecified: Secondary | ICD-10-CM

## 2014-06-16 DIAGNOSIS — J309 Allergic rhinitis, unspecified: Secondary | ICD-10-CM

## 2014-06-16 MED ORDER — BISOPROLOL FUMARATE 5 MG PO TABS: 2.5000 mg | ORAL_TABLET | Freq: Every day | ORAL | Status: DC

## 2014-06-16 MED ORDER — AMLODIPINE BESYLATE 5 MG PO TABS: 5.0000 mg | ORAL_TABLET | Freq: Every day | ORAL | Status: DC

## 2014-06-16 MED ORDER — FLUTICASONE PROPIONATE 50 MCG/ACT NA SUSP: 2.0000 | Freq: Every day | NASAL | Status: DC

## 2014-06-16 MED ORDER — HYDRALAZINE HCL 25 MG PO TABS: ORAL_TABLET | ORAL | Status: DC

## 2014-06-16 MED ORDER — FLUTICASONE-SALMETEROL 250-50 MCG/DOSE IN AEPB: 1.0000 | INHALATION_SPRAY | Freq: Two times a day (BID) | RESPIRATORY_TRACT | Status: DC

## 2014-06-16 NOTE — Progress Notes (Signed)
Patient ID: Tammie [name], female   DOB: [birth year], 53 y.o.   MRN: [id number] Patient ID: Tammie [name], female   DOB: 1960/12/20, 53 y.o.   MRN: [id number]    Allergies  Allergen Reactions  . Aspirin Anaphylaxis, Swelling and Other (See Comments)    Throat swelling   . Ibuprofen Anaphylaxis, Swelling and Other (See Comments)    Throat swelling  . Nsaids Anaphylaxis, Swelling and Other (See Comments)  . Ace Inhibitors Other (See Comments)    Angioedema  . Lipitor [Atorvastatin] Swelling and Other (See Comments)    Face, feet swell    Chief Complaint  Patient presents with  . Follow-up    Follw-up on hypertension and asthma    HPI: Patient is a 53 y.o. female seen in the office today to follow up on asthma and hypertension. Overall feels better.   Has started advair and noted improvement with breathing. Not using albuterol as frequently (once a week)  No noted side effects from medication  Improvement with blood pressure on norvasc; has had swelling in LE, does not wear TED hose and eats a lot of sodium. Does not have any pain or discomfort due to swelling.  Review of Systems:   Review of Systems  Constitutional: Negative for fever, chills and weight loss.  HENT: Positive for congestion (chronic, does not use flonase routinely). Negative for sore throat.   Respiratory: Negative for cough, shortness of breath and wheezing.   Cardiovascular: Positive for leg swelling (swelling comes and goes). Negative for chest pain and palpitations.  Gastrointestinal: Negative for heartburn, nausea, vomiting, diarrhea and constipation.  Genitourinary: Negative for dysuria and urgency.  Musculoskeletal: Negative for myalgias, joint pain and falls.  Skin: Negative for itching and rash.  Neurological: Negative for dizziness, seizures, weakness and headaches.  Psychiatric/Behavioral: Negative for depression.    Past Medical History  Diagnosis Date  . Hypertension   . Environmental allergies     . Asthma   . Shortness of breath   . Respiratory failure, acute 04/19/2014  . Anxiety   . Headache(784.0)   . GERD (gastroesophageal reflux disease)    Past Surgical History  Procedure Laterality Date  . Abdominal hysterectomy  [facility]    Social History:   reports that she has never smoked. She has never used smokeless tobacco. She reports that she does not drink alcohol or use illicit drugs.  Family History  Problem Relation Age of Onset  . Heart attack Father   . Heart attack Mother   . High blood pressure Son   . Cancer Maternal Grandmother   . Heart disease Paternal Grandmother   . Asthma Other     Great paternal grandfather   . Autism Son   . Diabetes Sister   . High blood pressure Brother   . Hypertension Brother   . High blood pressure Sister   . Cancer Sister   . Asthma Other     [place]ice     Medications: Patient's Medications  New Prescriptions   No medications on file  Previous Medications   ALBUTEROL (PROVENTIL HFA;VENTOLIN HFA) 108 (90 BASE) MCG/ACT INHALER    Inhale 2 puffs into the lungs every 4 (four) hours as needed for wheezing (bronchitis).    CETIRIZINE (ZYRTEC) 10 MG TABLET    Take 10 mg by mouth daily.   MONTELUKAST (SINGULAIR) 10 MG TABLET    Take 1 tablet (10 mg total) by mouth at bedtime.  Modified Medications  Modified Medication Previous Medication   AMLODIPINE (NORVASC) 5 MG TABLET amLODipine (NORVASC) 5 MG tablet      Take 1 tablet (5 mg total) by mouth daily. For high blood pressure    Take 1 tablet (5 mg total) by mouth daily.   BISOPROLOL (ZEBETA) 5 MG TABLET bisoprolol (ZEBETA) 5 MG tablet      Take 0.5 tablets (2.5 mg total) by mouth daily. For high blood pressure    Take 0.5 tablets (2.5 mg total) by mouth daily.   BISOPROLOL (ZEBETA) 5 MG TABLET bisoprolol (ZEBETA) 5 MG tablet      Take 0.5 tablets (2.5 mg total) by mouth daily. For high blood pressure    Take 0.5 tablets (2.5 mg total) by mouth daily.   FLUTICASONE  (FLONASE) 50 MCG/ACT NASAL SPRAY fluticasone (FLONASE) 50 MCG/ACT nasal spray      Place 2 sprays into both nostrils daily. For allergic rhinitis    Place 2 sprays into both nostrils daily. For allergic rhinitis   FLUTICASONE-SALMETEROL (ADVAIR) 250-50 MCG/DOSE AEPB Fluticasone-Salmeterol (ADVAIR) 250-50 MCG/DOSE AEPB      Inhale 1 puff into the lungs 2 (two) times daily.    Inhale 1 puff into the lungs 2 (two) times daily.   HYDRALAZINE (APRESOLINE) 25 MG TABLET hydrALAZINE (APRESOLINE) 25 MG tablet      1 by mouth every 8 hours for high blood pressure    TAKE 1 TABLET BY MOUTH EVERY 8 HOURS  Discontinued Medications   FLUTICASONE-SALMETEROL (ADVAIR) 100-50 MCG/DOSE AEPB    Inhale 1 puff into the lungs 2 (two) times daily.     Physical Exam:  Filed Vitals:   06/16/14 0849  BP: 126/84  Pulse: 79  Temp: 97.6 F (36.4 C)  TempSrc: Oral  Resp: 12  Weight: 141 lb (63.957 kg)  SpO2: 94%    Physical Exam  Constitutional: She is oriented to person, place, and time. She appears well-developed and well-nourished. No distress.  HENT:  Head: Normocephalic and atraumatic.  Left Ear: External ear normal.  Nose: Nose normal.  Mouth/Throat: Oropharynx is clear and moist. No oropharyngeal exudate.  Eyes: Conjunctivae and EOM are normal. Pupils are equal, round, and reactive to light.  Neck: Normal range of motion. Neck supple.  Cardiovascular: Normal rate, regular rhythm and normal heart sounds.   Respiratory: Effort normal. No respiratory distress. She has decreased breath sounds. She has no wheezes.  GI: Soft. Bowel sounds are normal.  Musculoskeletal: She exhibits no tenderness. Edema: +1 bilaterally.  Neurological: She is alert and oriented to person, place, and time.  Skin: Skin is warm and dry. She is not diaphoretic.     Labs reviewed: Basic Metabolic Panel:  Recent Labs  04/16/14 2120  04/17/14 1253 04/17/14 2210  04/21/14 1510 04/22/14 0342 04/25/14 1315  NA  --   < >   --   --   < > 146 143 139  K  --   < >  --   --   < > 4.2 3.8 3.9  CL  --   < >  --   --   < > 106 101 101  CO2  --   < >  --   --   < > 28 28 25   GLUCOSE  --   < >  --   --   < > 166* 100* 128*  BUN  --   < >  --   --   < > 29* 24* 17  CREATININE  --   < >  --   --   < > 0.96 0.91 0.98  CALCIUM  --   < >  --   --   < > 10.0 9.3 9.3  MG 2.5  --  2.3 2.4  --   --   --   --   PHOS 3.6  --  3.7 3.0  --   --   --   --   < > = values in this interval not displayed. Liver Function Tests:  Recent Labs  04/15/14 1359 04/17/14 0343  AST 30 21  ALT 23 16  ALKPHOS 112 72  BILITOT 1.1 <0.2*  PROT 7.4 6.4  ALBUMIN 3.5 2.9*   No results for input(s): LIPASE, AMYLASE in the last 8760 hours. No results for input(s): AMMONIA in the last 8760 hours. CBC:  Recent Labs  04/15/14 1359  04/17/14 0343 04/18/14 0320 04/20/14 0548 04/22/14 0342  WBC 8.7  < > 13.5* 10.6* 11.3* 9.5  NEUTROABS 3.6  --  12.4* 9.9*  --   --   HGB 14.1  < > 12.2 10.0* 14.4 14.6  HCT 40.8  < > 36.7 27.2* 43.3 44.0  MCV 81.6  < > 83.0 88.6 83.1 82.9  PLT 318  < > 287 210 330 257  < > = values in this interval not displayed. Lipid Panel:  Recent Labs  04/15/14 2000  TRIG 120   TSH: No results for input(s): TSH in the last 8760 hours. A1C: No results found for: HGBA1C   Assessment/Plan  1. Essential hypertension -improved on norvasc but having some edema, to use TED hose, decrease sodium and elevate LE when sitting. Pt willing to make changes - Lipid panel - Compression stockings - bisoprolol (ZEBETA) 5 MG tablet; Take 0.5 tablets (2.5 mg total) by mouth daily. For high blood pressure  Dispense: 30 tablet; Refill: 3 - amLODipine (NORVASC) 5 MG tablet; Take 1 tablet (5 mg total) by mouth daily. For high blood pressure  Dispense: 90 tablet; Refill: 3 - hydrALAZINE (APRESOLINE) 25 MG tablet; 1 by mouth every 8 hours for high blood pressure  Dispense: 90 tablet; Refill: 3 - bisoprolol (ZEBETA) 5 MG tablet;  Take 0.5 tablets (2.5 mg total) by mouth daily. For high blood pressure  Dispense: 30 tablet; Refill: 3 - Hepatic function panel  2. Asthma, chronic, moderate persistent, with status asthmaticus Has improved on advair, will give Rx at this time - Fluticasone-Salmeterol (ADVAIR) 250-50 MCG/DOSE AEPB; Inhale 1 puff into the lungs 2 (two) times daily.  Dispense: 60 each; Refill: 3  3. Allergic Rhinitis -to take zyrtec OTC daily to help with allergies  - fluticasone (FLONASE) 50 MCG/ACT nasal spray; Place 2 sprays into both nostrils daily. For allergic rhinitis  Dispense: 9.9 g; Refill: 3   4. Edema -to use TED hose and decrease sodium intake -will cont to monitor

## 2014-06-16 NOTE — Patient Instructions (Signed)
Continue all current medications, refills have been provided Take generic zyrtec daily  Cut back on sodium and wear compression stocking on legs  See you next week for your Physical

## 2014-06-17 ENCOUNTER — Telehealth: Payer: Self-pay | Admitting: *Deleted

## 2014-06-17 LAB — LIPID PANEL
CHOLESTEROL TOTAL: 155 mg/dL (ref 100–199)
Chol/HDL Ratio: 3.4 ratio units (ref 0.0–4.4)
HDL: 45 mg/dL (ref >39)
LDL Calculated: 86 mg/dL (ref 0–99)
Triglycerides: 122 mg/dL (ref 0–149)
VLDL CHOLESTEROL CAL: 24 mg/dL (ref 5–40)

## 2014-06-17 LAB — HEPATIC FUNCTION PANEL
ALBUMIN: 4.6 g/dL (ref 3.5–5.5)
ALT: 15 IU/L (ref 0–32)
AST: 20 IU/L (ref 0–40)
Alkaline Phosphatase: 115 IU/L (ref 39–117)
Bilirubin, Direct: 0.18 mg/dL (ref 0.00–0.40)
Total Bilirubin: 0.7 mg/dL (ref 0.0–1.2)
Total Protein: 7.6 g/dL (ref 6.0–8.5)

## 2014-06-17 NOTE — Telephone Encounter (Signed)
Called and started prior authorization process, requested forms to be filled out awaiting fax.

## 2014-06-20 NOTE — Telephone Encounter (Signed)
Called [place] began prior authorization process  , completed and filled out paper's waiting for response from insurance company.

## 2014-06-23 ENCOUNTER — Encounter: Payer: Self-pay | Admitting: Nurse Practitioner

## 2014-06-23 ENCOUNTER — Other Ambulatory Visit: Payer: Self-pay | Admitting: *Deleted

## 2014-06-23 ENCOUNTER — Ambulatory Visit (INDEPENDENT_AMBULATORY_CARE_PROVIDER_SITE_OTHER): Payer: No Typology Code available for payment source | Admitting: Nurse Practitioner

## 2014-06-23 VITALS — BP 160/100 | HR 100 | Temp 98.1°F | Wt 142.4 lb

## 2014-06-23 DIAGNOSIS — Z1211 Encounter for screening for malignant neoplasm of colon: Secondary | ICD-10-CM

## 2014-06-23 DIAGNOSIS — Z1231 Encounter for screening mammogram for malignant neoplasm of breast: Secondary | ICD-10-CM

## 2014-06-23 DIAGNOSIS — Z0000 Encounter for general adult medical examination without abnormal findings: Secondary | ICD-10-CM

## 2014-06-23 DIAGNOSIS — C539 Malignant neoplasm of cervix uteri, unspecified: Secondary | ICD-10-CM

## 2014-06-23 NOTE — Progress Notes (Signed)
Patient ID: Tammie [name], female   DOB: 04/18/1961, 53 y.o.   MRN: [id number]   Allergies  Allergen Reactions  . Aspirin Anaphylaxis, Swelling and Other (See Comments)    Throat swelling   . Ibuprofen Anaphylaxis, Swelling and Other (See Comments)    Throat swelling  . Nsaids Anaphylaxis, Swelling and Other (See Comments)  . Ace Inhibitors Other (See Comments)    Angioedema  . Lipitor [Atorvastatin] Swelling and Other (See Comments)    Face, feet swell    Chief Complaint  Patient presents with  . Annual Exam    Yearly Physical    HPI: Patient is a 53 y.o. female seen in the office today for yearly physical. There has bee no new issues since last visit Pt hx states hysterectomy in 19 but had her son in 52. Reports she had a "hysterectomy" a year before the birth of her son. She is actually not sure what they did. Willing to have pelvic/pap today Needs mammogram. Has not had routine dental care Does not see eye doctor No routine exercise. Attempts to follow low sodium diet but admits she is unsuccessful most the time.  Did not take blood pressure medication today Review of Systems:   Review of Systems  Constitutional: Negative for fever, chills and weight loss.  HENT: Positive for congestion (chronic, does not use flonase routinely). Negative for sore throat.   Respiratory: Negative for cough, shortness of breath and wheezing.   Cardiovascular: Positive for leg swelling (swelling comes and goes). Negative for chest pain and palpitations.  Gastrointestinal: Negative for heartburn, nausea, vomiting, diarrhea and constipation.  Genitourinary: Negative for dysuria and urgency.  Musculoskeletal: Negative for myalgias, joint pain and falls.  Skin: Negative for itching and rash.  Neurological: Negative for dizziness, seizures, weakness and headaches.  Psychiatric/Behavioral: Negative for depression.    Past Medical History  Diagnosis Date  . Hypertension   . Environmental  allergies   . Asthma   . Shortness of breath   . Respiratory failure, acute 04/19/2014  . Anxiety   . Headache(784.0)   . GERD (gastroesophageal reflux disease)    Past Surgical History  Procedure Laterality Date  . Abdominal hysterectomy  [facility]    Social History:   reports that she has never smoked. She has never used smokeless tobacco. She reports that she does not drink alcohol or use illicit drugs.  Family History  Problem Relation Age of Onset  . Heart attack Father   . Heart attack Mother   . High blood pressure Son   . Cancer Maternal Grandmother   . Heart disease Paternal Grandmother   . Asthma Other     Great paternal grandfather   . Autism Son   . Diabetes Sister   . High blood pressure Brother   . Hypertension Brother   . High blood pressure Sister   . Cancer Sister   . Asthma Other     [place]     Medications: Patient's Medications  New Prescriptions   No medications on file  Previous Medications   ALBUTEROL (PROVENTIL HFA;VENTOLIN HFA) 108 (90 BASE) MCG/ACT INHALER    Inhale 2 puffs into the lungs every 4 (four) hours as needed for wheezing (bronchitis).    AMLODIPINE (NORVASC) 5 MG TABLET    Take 1 tablet (5 mg total) by mouth daily. For high blood pressure   BISOPROLOL (ZEBETA) 5 MG TABLET    Take 0.5 tablets (2.5 mg total) by mouth  daily. For high blood pressure   CETIRIZINE (ZYRTEC) 10 MG TABLET    Take 10 mg by mouth daily.   FLUTICASONE (FLONASE) 50 MCG/ACT NASAL SPRAY    Place 2 sprays into both nostrils daily. For allergic rhinitis   FLUTICASONE-SALMETEROL (ADVAIR) 250-50 MCG/DOSE AEPB    Inhale 1 puff into the lungs 2 (two) times daily.   HYDRALAZINE (APRESOLINE) 25 MG TABLET    1 by mouth every 8 hours for high blood pressure   MONTELUKAST (SINGULAIR) 10 MG TABLET    Take 1 tablet (10 mg total) by mouth at bedtime.  Modified Medications   No medications on file  Discontinued Medications   BISOPROLOL (ZEBETA) 5 MG TABLET     Take 0.5 tablets (2.5 mg total) by mouth daily. For high blood pressure     Physical Exam:  Filed Vitals:   06/23/14 1331  BP: 160/100  Pulse: 100  Temp: 98.1 F (36.7 C)  TempSrc: Oral  Weight: 142 lb 6.4 oz (64.592 kg)  SpO2: 97%    Physical Exam  Constitutional: She is oriented to person, place, and time. She appears well-developed and well-nourished. No distress.  HENT:  Head: Normocephalic and atraumatic.  Left Ear: External ear normal.  Nose: Nose normal.  Mouth/Throat: Oropharynx is clear and moist. No oropharyngeal exudate.  Eyes: Conjunctivae and EOM are normal. Pupils are equal, round, and reactive to light.  Neck: Normal range of motion. Neck supple.  Cardiovascular: Normal rate, regular rhythm and normal heart sounds.   Respiratory: Effort normal. No respiratory distress. She has decreased breath sounds. She has no wheezes.  GI: Soft. Bowel sounds are normal. She exhibits no distension. There is no tenderness.  Genitourinary: Vagina normal.  Musculoskeletal: Normal range of motion. She exhibits no edema or tenderness.  Neurological: She is alert and oriented to person, place, and time.  Skin: Skin is warm and dry. She is not diaphoretic.  Psychiatric: She has a normal mood and affect.     Labs reviewed: Basic Metabolic Panel:  Recent Labs  04/16/14 2120  04/17/14 1253 04/17/14 2210  04/21/14 1510 04/22/14 0342 04/25/14 1315  NA  --   < >  --   --   < > 146 143 139  K  --   < >  --   --   < > 4.2 3.8 3.9  CL  --   < >  --   --   < > 106 101 101  CO2  --   < >  --   --   < > 28 28 25   GLUCOSE  --   < >  --   --   < > 166* 100* 128*  BUN  --   < >  --   --   < > 29* 24* 17  CREATININE  --   < >  --   --   < > 0.96 0.91 0.98  CALCIUM  --   < >  --   --   < > 10.0 9.3 9.3  MG 2.5  --  2.3 2.4  --   --   --   --   PHOS 3.6  --  3.7 3.0  --   --   --   --   < > = values in this interval not displayed. Liver Function Tests:  Recent Labs   04/15/14 1359 04/17/14 0343 06/16/14 0936  AST 30 21 20   ALT 23 16 15  ALKPHOS 112 72 115  BILITOT 1.1 <0.2* 0.7  PROT 7.4 6.4 7.6  ALBUMIN 3.5 2.9*  --    No results for input(s): LIPASE, AMYLASE in the last 8760 hours. No results for input(s): AMMONIA in the last 8760 hours. CBC:  Recent Labs  04/15/14 1359  04/17/14 0343 04/18/14 0320 04/20/14 0548 04/22/14 0342  WBC 8.7  < > 13.5* 10.6* 11.3* 9.5  NEUTROABS 3.6  --  12.4* 9.9*  --   --   HGB 14.1  < > 12.2 10.0* 14.4 14.6  HCT 40.8  < > 36.7 27.2* 43.3 44.0  MCV 81.6  < > 83.0 88.6 83.1 82.9  PLT 318  < > 287 210 330 257  < > = values in this interval not displayed. Lipid Panel:  Recent Labs  04/15/14 2000 06/16/14 0936  HDL  --  45  LDLCALC  --  86  TRIG 120 122  CHOLHDL  --  3.4   TSH: No results for input(s): TSH in the last 8760 hours. A1C: No results found for: HGBA1C   Assessment/Plan  ROUTINE GENERAL MEDICAL EXAMINATION AT HEALTH CARE    The patient is doing well and no distinct problems were identified on exam. PREVENTIVE COUNSELING:  The patient was counseled regarding the appropriate use of alcohol, regular self-examination of the breasts on a monthly basis, prevention of dental and periodontal disease, diet, regular sustained exercise for at least 30 minutes 5 times per week, routine screening interval for mammogram as recommended by the [place] and ACOG, importance of regular PAP smears, and recommended schedule for GI hemoccult testing, colonoscopy, cholesterol, thyroid and diabetes screening. Repeat blood pressure 150/90, discussed taking medications prior to appts   Special screening for malignant neoplasms, colon - Ambulatory referral to Gastroenterology  Encounter for screening mammogram for breast cancer - MM DIGITAL SCREENING BILATERAL; Future  Follow up in 2 months with Dr Bubba Camp

## 2014-06-23 NOTE — Addendum Note (Signed)
Addended by: Mosie Epstein on: 06/23/2014 04:50 PM   Modules accepted: Orders

## 2014-06-23 NOTE — Patient Instructions (Addendum)
Follow up in 2 months with Dr Bubba Camp, sooner if needed   Health Maintenance Adopting a healthy lifestyle and getting preventive care can go a long way to promote health and wellness. Talk with your health care provider about what schedule of regular examinations is right for you. This is a good chance for you to check in with your provider about disease prevention and staying healthy. In between checkups, there are plenty of things you can do on your own. Experts have done a lot of research about which lifestyle changes and preventive measures are most likely to keep you healthy. Ask your health care provider for more information. WEIGHT AND DIET  Eat a healthy diet  Be sure to include plenty of vegetables, fruits, low-fat dairy products, and lean protein.  Do not eat a lot of foods high in solid fats, added sugars, or salt.  Get regular exercise. This is one of the most important things you can do for your health.  Most adults should exercise for at least 150 minutes each week. The exercise should increase your heart rate and make you sweat (moderate-intensity exercise).  Most adults should also do strengthening exercises at least twice a week. This is in addition to the moderate-intensity exercise.  Maintain a healthy weight  Body mass index (BMI) is a measurement that can be used to identify possible weight problems. It estimates body fat based on height and weight. Your health care provider can help determine your BMI and help you achieve or maintain a healthy weight.  For females 35 years of age and older:   A BMI below 18.5 is considered underweight.  A BMI of 18.5 to 24.9 is normal.  A BMI of 25 to 29.9 is considered overweight.  A BMI of 30 and above is considered obese.  Watch levels of cholesterol and blood lipids  You should start having your blood tested for lipids and cholesterol at 55 years of age, then have this test every 5 years.  You may need to have your  cholesterol levels checked more often if:  Your lipid or cholesterol levels are high.  You are older than 55 years of age.  You are at high risk for heart disease.  CANCER SCREENING   Lung Cancer  Lung cancer screening is recommended for adults 67-22 years old who are at high risk for lung cancer because of a history of smoking.  A yearly low-dose CT scan of the lungs is recommended for people who:  Currently smoke.  Have quit within the past 15 years.  Have at least a 30-pack-year history of smoking. A pack year is smoking an average of one pack of cigarettes a day for 1 year.  Yearly screening should continue until it has been 15 years since you quit.  Yearly screening should stop if you develop a health problem that would prevent you from having lung cancer treatment.  Breast Cancer  Practice breast self-awareness. This means understanding how your breasts normally appear and feel.  It also means doing regular breast self-exams. Let your health care provider know about any changes, no matter how small.  If you are in your 20s or 30s, you should have a clinical breast exam (CBE) by a health care provider every 1-3 years as part of a regular health exam.  If you are 48 or older, have a CBE every year. Also consider having a breast X-ray (mammogram) every year.  If you have a family history of breast cancer,  talk to your health care provider about genetic screening.  If you are at high risk for breast cancer, talk to your health care provider about having an MRI and a mammogram every year.  Breast cancer gene (BRCA) assessment is recommended for women who have family members with BRCA-related cancers. BRCA-related cancers include:  Breast.  Ovarian.  Tubal.  Peritoneal cancers.  Results of the assessment will determine the need for genetic counseling and BRCA1 and BRCA2 testing. Cervical Cancer Routine pelvic examinations to screen for cervical cancer are no  longer recommended for nonpregnant women who are considered low risk for cancer of the pelvic organs (ovaries, uterus, and vagina) and who do not have symptoms. A pelvic examination may be necessary if you have symptoms including those associated with pelvic infections. Ask your health care provider if a screening pelvic exam is right for you.   The Pap test is the screening test for cervical cancer for women who are considered at risk.  If you had a hysterectomy for a problem that was not cancer or a condition that could lead to cancer, then you no longer need Pap tests.  If you are older than 65 years, and you have had normal Pap tests for the past 10 years, you no longer need to have Pap tests.  If you have had past treatment for cervical cancer or a condition that could lead to cancer, you need Pap tests and screening for cancer for at least 20 years after your treatment.  If you no longer get a Pap test, assess your risk factors if they change (such as having a new sexual partner). This can affect whether you should start being screened again.  Some women have medical problems that increase their chance of getting cervical cancer. If this is the case for you, your health care provider may recommend more frequent screening and Pap tests.  The human papillomavirus (HPV) test is another test that may be used for cervical cancer screening. The HPV test looks for the virus that can cause cell changes in the cervix. The cells collected during the Pap test can be tested for HPV.  The HPV test can be used to screen women 12 years of age and older. Getting tested for HPV can extend the interval between normal Pap tests from three to five years.  An HPV test also should be used to screen women of any age who have unclear Pap test results.  After 55 years of age, women should have HPV testing as often as Pap tests.  Colorectal Cancer  This type of cancer can be detected and often  prevented.  Routine colorectal cancer screening usually begins at 55 years of age and continues through 55 years of age.  Your health care provider may recommend screening at an earlier age if you have risk factors for colon cancer.  Your health care provider may also recommend using home test kits to check for hidden blood in the stool.  A small camera at the end of a tube can be used to examine your colon directly (sigmoidoscopy or colonoscopy). This is done to check for the earliest forms of colorectal cancer.  Routine screening usually begins at age 35.  Direct examination of the colon should be repeated every 5-10 years through 55 years of age. However, you may need to be screened more often if early forms of precancerous polyps or small growths are found. Skin Cancer  Check your skin from head to toe regularly.  Tell your health care provider about any new moles or changes in moles, especially if there is a change in a mole's shape or color.  Also tell your health care provider if you have a mole that is larger than the size of a pencil eraser.  Always use sunscreen. Apply sunscreen liberally and repeatedly throughout the day.  Protect yourself by wearing long sleeves, pants, a wide-brimmed hat, and sunglasses whenever you are outside. HEART DISEASE, DIABETES, AND HIGH BLOOD PRESSURE   Have your blood pressure checked at least every 1-2 years. High blood pressure causes heart disease and increases the risk of stroke.  If you are between 72 years and 44 years old, ask your health care provider if you should take aspirin to prevent strokes.  Have regular diabetes screenings. This involves taking a blood sample to check your fasting blood sugar level.  If you are at a normal weight and have a low risk for diabetes, have this test once every three years after 55 years of age.  If you are overweight and have a high risk for diabetes, consider being tested at a younger age or more  often. PREVENTING INFECTION  Hepatitis B  If you have a higher risk for hepatitis B, you should be screened for this virus. You are considered at high risk for hepatitis B if:  You were born in a country where hepatitis B is common. Ask your health care provider which countries are considered high risk.  Your parents were born in a high-risk country, and you have not been immunized against hepatitis B (hepatitis B vaccine).  You have HIV or AIDS.  You use needles to inject street drugs.  You live with someone who has hepatitis B.  You have had sex with someone who has hepatitis B.  You get hemodialysis treatment.  You take certain medicines for conditions, including cancer, organ transplantation, and autoimmune conditions. Hepatitis C  Blood testing is recommended for:  Everyone born from [birth year] through 1967.  Anyone with known risk factors for hepatitis C. Sexually transmitted infections (STIs)  You should be screened for sexually transmitted infections (STIs) including gonorrhea and chlamydia if:  You are sexually active and are younger than 55 years of age.  You are older than 55 years of age and your health care provider tells you that you are at risk for this type of infection.  Your sexual activity has changed since you were last screened and you are at an increased risk for chlamydia or gonorrhea. Ask your health care provider if you are at risk.  If you do not have HIV, but are at risk, it may be recommended that you take a prescription medicine daily to prevent HIV infection. This is called pre-exposure prophylaxis (PrEP). You are considered at risk if:  You are sexually active and do not regularly use condoms or know the HIV status of your partner(s).  You take drugs by injection.  You are sexually active with a partner who has HIV. Talk with your health care provider about whether you are at high risk of being infected with HIV. If you choose to begin PrEP, you  should first be tested for HIV. You should then be tested every 3 months for as long as you are taking PrEP.  PREGNANCY   If you are premenopausal and you may become pregnant, ask your health care provider about preconception counseling.  If you may become pregnant, take 400 to 800 micrograms (mcg) of folic acid  every day.  If you want to prevent pregnancy, talk to your health care provider about birth control (contraception). OSTEOPOROSIS AND MENOPAUSE   Osteoporosis is a disease in which the bones lose minerals and strength with aging. This can result in serious bone fractures. Your risk for osteoporosis can be identified using a bone density scan.  If you are 63 years of age or older, or if you are at risk for osteoporosis and fractures, ask your health care provider if you should be screened.  Ask your health care provider whether you should take a calcium or vitamin D supplement to lower your risk for osteoporosis.  Menopause may have certain physical symptoms and risks.  Hormone replacement therapy may reduce some of these symptoms and risks. Talk to your health care provider about whether hormone replacement therapy is right for you.  HOME CARE INSTRUCTIONS   Schedule regular health, dental, and eye exams.  Stay current with your immunizations.   Do not use any tobacco products including cigarettes, chewing tobacco, or electronic cigarettes.  If you are pregnant, do not drink alcohol.  If you are breastfeeding, limit how much and how often you drink alcohol.  Limit alcohol intake to no more than 1 drink per day for nonpregnant women. One drink equals 12 ounces of beer, 5 ounces of wine, or 1 ounces of hard liquor.  Do not use street drugs.  Do not share needles.  Ask your health care provider for help if you need support or information about quitting drugs.  Tell your health care provider if you often feel depressed.  Tell your health care provider if you have ever  been abused or do not feel safe at home. Document Released: 02/04/2011 Document Revised: 12/06/2013 Document Reviewed: 06/23/2013 [facility] Patient Information 2015 [place], [place]. This information is not intended to replace advice given to you by your health care provider. Make sure you discuss any questions you have with your health care provider.

## 2014-06-27 LAB — PAP IG (IMAGE GUIDED): PAP Smear Comment: 0

## 2014-07-20 ENCOUNTER — Telehealth: Payer: Self-pay | Admitting: Nurse Practitioner

## 2014-07-20 NOTE — Telephone Encounter (Signed)
Attempted to call with results of PAP, no answer left msg to call office back

## 2014-07-28 ENCOUNTER — Ambulatory Visit: Payer: No Typology Code available for payment source | Admitting: Nurse Practitioner

## 2014-08-23 ENCOUNTER — Other Ambulatory Visit: Payer: Self-pay | Admitting: Nurse Practitioner

## 2014-09-06 ENCOUNTER — Ambulatory Visit: Payer: No Typology Code available for payment source | Admitting: Internal Medicine

## 2014-09-27 ENCOUNTER — Encounter: Payer: Self-pay | Admitting: Internal Medicine

## 2014-10-12 ENCOUNTER — Other Ambulatory Visit: Payer: Self-pay | Admitting: Nurse Practitioner

## 2014-10-14 ENCOUNTER — Other Ambulatory Visit: Payer: Self-pay | Admitting: Nurse Practitioner

## 2014-10-15 ENCOUNTER — Other Ambulatory Visit: Payer: Self-pay | Admitting: Nurse Practitioner

## 2014-11-15 ENCOUNTER — Ambulatory Visit: Payer: No Typology Code available for payment source | Admitting: Internal Medicine

## 2014-11-16 ENCOUNTER — Ambulatory Visit (INDEPENDENT_AMBULATORY_CARE_PROVIDER_SITE_OTHER): Payer: 59 | Admitting: Internal Medicine

## 2014-11-16 ENCOUNTER — Ambulatory Visit: Payer: 59 | Admitting: Internal Medicine

## 2014-11-16 VITALS — BP 140/88 | HR 88 | Temp 98.0°F | Resp 20 | Ht <= 58 in | Wt 144.0 lb

## 2014-11-16 DIAGNOSIS — I10 Essential (primary) hypertension: Secondary | ICD-10-CM

## 2014-11-16 DIAGNOSIS — J309 Allergic rhinitis, unspecified: Secondary | ICD-10-CM | POA: Diagnosis not present

## 2014-11-16 DIAGNOSIS — J4542 Moderate persistent asthma with status asthmaticus: Secondary | ICD-10-CM | POA: Diagnosis not present

## 2014-11-16 DIAGNOSIS — M62830 Muscle spasm of back: Secondary | ICD-10-CM | POA: Diagnosis not present

## 2014-11-16 MED ORDER — FLUTICASONE PROPIONATE 50 MCG/ACT NA SUSP: 2.0000 | Freq: Every day | NASAL | Status: DC

## 2014-11-16 MED ORDER — AMLODIPINE BESYLATE 5 MG PO TABS: 5.0000 mg | ORAL_TABLET | Freq: Every day | ORAL | Status: DC

## 2014-11-16 MED ORDER — BISOPROLOL FUMARATE 5 MG PO TABS: 2.5000 mg | ORAL_TABLET | Freq: Every day | ORAL | Status: DC

## 2014-11-16 MED ORDER — METHYLPREDNISOLONE ACETATE 80 MG/ML IJ SUSP
80.0000 mg | Freq: Once | INTRAMUSCULAR | Status: AC
  Administered 2014-11-16: 80 mg via INTRAMUSCULAR

## 2014-11-16 MED ORDER — HYDRALAZINE HCL 25 MG PO TABS: ORAL_TABLET | ORAL | Status: DC

## 2014-11-16 MED ORDER — FLUTICASONE-SALMETEROL 250-50 MCG/DOSE IN AEPB: 1.0000 | INHALATION_SPRAY | Freq: Two times a day (BID) | RESPIRATORY_TRACT | Status: DC

## 2014-11-16 MED ORDER — CETIRIZINE HCL 10 MG PO TABS: 10.0000 mg | ORAL_TABLET | Freq: Every day | ORAL | Status: DC

## 2014-11-16 MED ORDER — ALBUTEROL SULFATE HFA 108 (90 BASE) MCG/ACT IN AERS: 2.0000 | INHALATION_SPRAY | RESPIRATORY_TRACT | Status: DC | PRN

## 2014-11-16 NOTE — Progress Notes (Signed)
Patient ID: Tammie [name], female   DOB: 1961/02/07, 54 y.o.   MRN: [id number]    Facility  [facility]    Place of Service:   OFFICE    Allergies  Allergen Reactions  . Ace Inhibitors Other (See Comments)    Angioedema  . Aspirin Anaphylaxis, Swelling and Other (See Comments)    Throat swelling   . Ibuprofen Anaphylaxis, Swelling and Other (See Comments)    Throat swelling  . Nsaids Anaphylaxis, Swelling and Other (See Comments)  . Lipitor [Atorvastatin] Swelling and Other (See Comments)    Face, feet swell    Chief Complaint  Patient presents with  . Medical Management of Chronic Issues    HPI:   54 yo female c/o lower back spasms and intermittent pain. She has not tried anything oTC. She has allergy to NSAIDs and ASA. She is out of HFA. She has chronic seasonal allergy and takes OTC meds + singulair. She needs med RF.   Asthma - she has been wheezing and sharing HFA with family.   Anxiety - stable. She does not take any meds  HTN- stable on zebeta, amlodipine and hydralazine.  Past Medical History  Diagnosis Date  . Hypertension   . Environmental allergies   . Asthma   . Shortness of breath   . Respiratory failure, acute 04/19/2014  . Anxiety   . Headache(784.0)   . GERD (gastroesophageal reflux disease)    Past Surgical History  Procedure Laterality Date  . Abdominal hysterectomy  [facility]    History   Social History  . Marital Status: Single    Spouse Name: N/A  . Number of Children: N/A  . Years of Education: N/A   Social History Main Topics  . Smoking status: Never Smoker   . Smokeless tobacco: Never Used  . Alcohol Use: No  . Drug Use: No  . Sexual Activity: No   Other Topics Concern  . Not on file   Social History Narrative   Diet-Salt Free   Drinks [employer]    Lives in an apartment, one stories, one person in home, no pets   Current/past profession: Dietary, dish-room, and cooks    Exercises 2 x weekly                Medications: Patient's Medications  New Prescriptions   No medications on file  Previous Medications   MONTELUKAST (SINGULAIR) 10 MG TABLET    TAKE 1 TABLET (10 MG TOTAL) BY MOUTH AT BEDTIME.  Modified Medications   Modified Medication Previous Medication   ALBUTEROL (PROVENTIL HFA;VENTOLIN HFA) 108 (90 BASE) MCG/ACT INHALER albuterol (PROVENTIL HFA;VENTOLIN HFA) 108 (90 BASE) MCG/ACT inhaler      Inhale 2 puffs into the lungs every 4 (four) hours as needed for wheezing (bronchitis).    Inhale 2 puffs into the lungs every 4 (four) hours as needed for wheezing (bronchitis).    AMLODIPINE (NORVASC) 5 MG TABLET amLODipine (NORVASC) 5 MG tablet      Take 1 tablet (5 mg total) by mouth daily. For high blood pressure    Take 1 tablet (5 mg total) by mouth daily. For high blood pressure   BISOPROLOL (ZEBETA) 5 MG TABLET bisoprolol (ZEBETA) 5 MG tablet      Take 0.5 tablets (2.5 mg total) by mouth daily. For high blood pressure    Take 0.5 tablets (2.5 mg total) by mouth daily. For high blood pressure   CETIRIZINE (ZYRTEC) 10 MG TABLET  cetirizine (ZYRTEC) 10 MG tablet      Take 1 tablet (10 mg total) by mouth daily.    Take 10 mg by mouth daily.   FLUTICASONE (FLONASE) 50 MCG/ACT NASAL SPRAY fluticasone (FLONASE) 50 MCG/ACT nasal spray      Place 2 sprays into both nostrils daily. For allergic rhinitis    Place 2 sprays into both nostrils daily. For allergic rhinitis   FLUTICASONE-SALMETEROL (ADVAIR) 250-50 MCG/DOSE AEPB Fluticasone-Salmeterol (ADVAIR) 250-50 MCG/DOSE AEPB      Inhale 1 puff into the lungs 2 (two) times daily.    Inhale 1 puff into the lungs 2 (two) times daily.   HYDRALAZINE (APRESOLINE) 25 MG TABLET hydrALAZINE (APRESOLINE) 25 MG tablet      1 by mouth every 8 hours for high blood pressure    1 by mouth every 8 hours for high blood pressure  Discontinued Medications   HYDRALAZINE (APRESOLINE) 25 MG TABLET    TAKE 1 TABLET BY MOUTH EVERY 8 HOURS   MONTELUKAST (SINGULAIR) 10  MG TABLET    TAKE 1 TABLET (10 MG TOTAL) BY MOUTH AT BEDTIME.   MONTELUKAST (SINGULAIR) 10 MG TABLET    TAKE 1 TABLET (10 MG TOTAL) BY MOUTH AT BEDTIME.     Review of Systems  Constitutional: Negative for fever, chills, diaphoresis, activity change, appetite change and fatigue.  HENT: Positive for rhinorrhea. Negative for ear pain and sore throat.        Swollen nose  Eyes: Positive for discharge (clear). Negative for visual disturbance.  Respiratory: Positive for chest tightness, shortness of breath and wheezing. Negative for cough.   Cardiovascular: Negative for chest pain, palpitations and leg swelling.  Gastrointestinal: Negative for nausea, vomiting, abdominal pain, diarrhea, constipation and blood in stool.  Genitourinary: Negative for dysuria.  Musculoskeletal: Positive for back pain. Negative for arthralgias.  Allergic/Immunologic: Positive for environmental allergies.  Neurological: Positive for dizziness and headaches. Negative for tremors, weakness and numbness.  Psychiatric/Behavioral: Negative for sleep disturbance. The patient is not nervous/anxious.     Filed Vitals:   11/16/14 1620  BP: 140/88  Pulse: 88  Temp: 98 F (36.7 C)  TempSrc: Oral  Resp: 20  Height: 4\' 9"  (1.448 m)  Weight: 144 lb (65.318 kg)  SpO2: 92%   Body mass index is 31.15 kg/(m^2).  Physical Exam  Constitutional: She is oriented to person, place, and time. She appears well-developed and well-nourished.  HENT:  Mouth/Throat: Oropharynx is clear and moist. No oropharyngeal exudate.  Eyes: Pupils are equal, round, and reactive to light. No scleral icterus.  Neck: Neck supple. No tracheal deviation present. No thyromegaly present.  Cardiovascular: Regular rhythm, intact distal pulses and normal pulses.  Tachycardia present.  Exam reveals no gallop and no friction rub.   Murmur heard.  Systolic murmur is present with a grade of 1/6  Trace LE edema b/l. No calf TTP  Pulmonary/Chest: Effort  normal. No accessory muscle usage or stridor. No respiratory distress. She has decreased breath sounds (throughout). She has wheezes. She has no rhonchi. She has no rales.  Abdominal: Soft. Bowel sounds are normal. She exhibits no distension and no mass. There is no tenderness. There is no rebound and no guarding.  Musculoskeletal: She exhibits edema and tenderness.  Lymphadenopathy:    She has no cervical adenopathy.  Neurological: She is alert and oriented to person, place, and time. She has normal reflexes.  Skin: Skin is warm and dry. No rash noted.     Psychiatric: She  has a normal mood and affect. Her behavior is normal. Judgment and thought content normal.     Labs reviewed: No visits with results within 3 Month(s) from this visit. Latest known visit with results is:  Office Visit on 06/23/2014  Component Date Value Ref Range Status  . DIAGNOSIS: 06/23/2014 Comment   Final   Comment: NEGATIVE FOR INTRAEPITHELIAL LESION AND MALIGNANCY. TRICHOMONAS VAGINALIS IS PRESENT.   Marland Kitchen Specimen adequacy: 06/23/2014 Comment   Final   Comment: Satisfactory for evaluation. Endocervical and/or squamous metaplastic cells (endocervical component) are present.   Marland Kitchen CLINICIAN PROVIDED ICD10: 06/23/2014 Comment   Final   Comment: 180.9 C53.9   . Performed by: 06/23/2014 Comment   Final   An Hulen Luster, Cytotechnologist (ASCP)  . QC reviewed by: 06/23/2014 Comment   Final   Cathlyn Parsons, Supervisory Cytotechnologist (ASCP)  . PAP SMEAR COMMENT 06/23/2014 .   Final  . Note: 06/23/2014 Comment   Final   Comment: The Pap smear is a screening test designed to aid in the detection of premalignant and malignant conditions of the uterine cervix.  It is not a diagnostic procedure and should not be used as the sole means of detecting cervical cancer.  Both false-positive and false-negative reports do occur.   . Test Methodology 06/23/2014 Comment   Final   Comment: This liquid based ThinPrep(R) pap test  was screened with the use of an image guided system.      Assessment/Plan    ICD-9-CM ICD-10-CM   1. Asthma, chronic, moderate persistent, with status asthmaticus 493.91 J45.42 Fluticasone-Salmeterol (ADVAIR) 250-50 MCG/DOSE AEPB  Cont advair, HFA prn and singulair   fluticasone (FLONASE) 50 MCG/ACT nasal spray  2. Essential hypertension - stable; cont meds as ordered 401.9 I10 hydrALAZINE (APRESOLINE) 25 MG tablet     bisoprolol (ZEBETA) 5 MG tablet     amLODipine (NORVASC) 5 MG tablet  3. Allergic rhinitis, unspecified allergic rhinitis type - stable on singulair, flonase and zyrtec 477.9 J30.9   4. Muscle spasm of back 724.8 M62.830    --new Rx written for meds  --DepoMedrol for allergy sx's and back spasm  --RTO in 6 mos for f/u  [facility] S. Perlie Gold  [facility] [address] [place], [place] [address] [phone] Office (Wednesdays and Fridays 8 AM - 5 PM) [phone] Cell (Monday-Friday 8 AM - 5 PM)

## 2014-11-16 NOTE — Patient Instructions (Signed)
Continue medications as ordered.  depomedrol 80 mg given today to help reduce inflammation and wheezing  Follow up in 6 mos for med RF

## 2014-11-18 ENCOUNTER — Encounter: Payer: Self-pay | Admitting: Internal Medicine

## 2014-11-23 ENCOUNTER — Ambulatory Visit: Payer: No Typology Code available for payment source | Admitting: Nurse Practitioner

## 2015-05-12 ENCOUNTER — Ambulatory Visit (INDEPENDENT_AMBULATORY_CARE_PROVIDER_SITE_OTHER): Payer: 59 | Admitting: Internal Medicine

## 2015-05-12 ENCOUNTER — Encounter: Payer: Self-pay | Admitting: Internal Medicine

## 2015-05-12 VITALS — BP 150/90 | HR 85 | Temp 98.9°F | Resp 20 | Ht <= 58 in | Wt 141.4 lb

## 2015-05-12 DIAGNOSIS — J4542 Moderate persistent asthma with status asthmaticus: Secondary | ICD-10-CM | POA: Diagnosis not present

## 2015-05-12 DIAGNOSIS — J309 Allergic rhinitis, unspecified: Secondary | ICD-10-CM | POA: Diagnosis not present

## 2015-05-12 DIAGNOSIS — I10 Essential (primary) hypertension: Secondary | ICD-10-CM | POA: Diagnosis not present

## 2015-05-12 DIAGNOSIS — J339 Nasal polyp, unspecified: Secondary | ICD-10-CM

## 2015-05-12 DIAGNOSIS — K029 Dental caries, unspecified: Secondary | ICD-10-CM

## 2015-05-12 MED ORDER — CETIRIZINE HCL 10 MG PO TABS: 10.0000 mg | ORAL_TABLET | Freq: Every day | ORAL | Status: DC

## 2015-05-12 MED ORDER — ALBUTEROL SULFATE HFA 108 (90 BASE) MCG/ACT IN AERS: 2.0000 | INHALATION_SPRAY | RESPIRATORY_TRACT | Status: DC | PRN

## 2015-05-12 MED ORDER — AMLODIPINE BESYLATE 10 MG PO TABS
10.0000 mg | ORAL_TABLET | Freq: Every day | ORAL | Status: DC
Start: 2015-05-12 — End: 2016-01-03

## 2015-05-12 NOTE — Progress Notes (Signed)
Patient ID: De Libman, female   DOB: 1961/01/05, 54 y.o.   MRN: [id number]    Location:    [facility]   Place of Service:   OFFICE  Chief Complaint  Patient presents with  . Medical Management of Chronic Issues    F/U for asthma  . Acute Visit    tumors in her nose     HPI:  54 yo female seen today for f/u asthma. She rec'd [employer] visit from [facility]. She was told she needs labs. Spot urine neg for glucose. She has not had labs in > 1 yr. Needs dental eval due to dental caries and poor dentition  Asthma /allergic rhinitis- stable now but used it frequently this past summer. She uses advair BID and takes singulair and zyrtec  HTN - she is taking meds as ordered now.   She is c/a lumps in her nose x 5 mos at least. She had 1 episode of epistaxis. Painful nares. No known trauma. (+) clear rhinorrhea "like a faucet"   Past Medical History  Diagnosis Date  . Hypertension   . Environmental allergies   . Asthma   . Shortness of breath   . Respiratory failure, acute ([place]) 04/19/2014  . Anxiety   . Headache(784.0)   . GERD (gastroesophageal reflux disease)     Past Surgical History  Procedure Laterality Date  . Abdominal hysterectomy  [facility]     Patient Care Team: Gildardo Cranker, DO as PCP - General (Internal Medicine)  Social History   Social History  . Marital Status: Single    Spouse Name: N/A  . Number of Children: N/A  . Years of Education: N/A   Occupational History  . Not on file.   Social History Main Topics  . Smoking status: Never Smoker   . Smokeless tobacco: Never Used  . Alcohol Use: No  . Drug Use: No  . Sexual Activity: No   Other Topics Concern  . Not on file   Social History Narrative   Diet-Salt Free   Drinks [employer]    Lives in an apartment, one stories, one person in home, no pets   Current/past profession: Dietary, dish-room, and cooks    Exercises 2 x weekly                 reports that she  has never smoked. She has never used smokeless tobacco. She reports that she does not drink alcohol or use illicit drugs.  Allergies  Allergen Reactions  . Ace Inhibitors Other (See Comments)    Angioedema  . Aspirin Anaphylaxis, Swelling and Other (See Comments)    Throat swelling   . Ibuprofen Anaphylaxis, Swelling and Other (See Comments)    Throat swelling  . Nsaids Anaphylaxis, Swelling and Other (See Comments)  . Lipitor [Atorvastatin] Swelling and Other (See Comments)    Face, feet swell    Medications: Patient's Medications  New Prescriptions   No medications on file  Previous Medications   AMLODIPINE (NORVASC) 5 MG TABLET    Take 1 tablet (5 mg total) by mouth daily. For high blood pressure   BISOPROLOL (ZEBETA) 5 MG TABLET    Take 0.5 tablets (2.5 mg total) by mouth daily. For high blood pressure   FLUTICASONE (FLONASE) 50 MCG/ACT NASAL SPRAY    Place 2 sprays into both nostrils daily. For allergic rhinitis   FLUTICASONE-SALMETEROL (ADVAIR) 250-50 MCG/DOSE AEPB    Inhale 1 puff into the lungs  2 (two) times daily.   HYDRALAZINE (APRESOLINE) 25 MG TABLET    1 by mouth every 8 hours for high blood pressure   MONTELUKAST (SINGULAIR) 10 MG TABLET    TAKE 1 TABLET (10 MG TOTAL) BY MOUTH AT BEDTIME.  Modified Medications   Modified Medication Previous Medication   ALBUTEROL (PROVENTIL HFA;VENTOLIN HFA) 108 (90 BASE) MCG/ACT INHALER albuterol (PROVENTIL HFA;VENTOLIN HFA) 108 (90 BASE) MCG/ACT inhaler      Inhale 2 puffs into the lungs every 4 (four) hours as needed for wheezing (bronchitis).    Inhale 2 puffs into the lungs every 4 (four) hours as needed for wheezing (bronchitis).   CETIRIZINE (ZYRTEC) 10 MG TABLET cetirizine (ZYRTEC) 10 MG tablet      Take 1 tablet (10 mg total) by mouth daily.    Take 1 tablet (10 mg total) by mouth daily.  Discontinued Medications   No medications on file    Review of Systems  Constitutional: Negative for fever, chills, diaphoresis,  activity change, appetite change and fatigue.  HENT: Positive for postnasal drip, rhinorrhea and sinus pressure. Negative for ear pain and sore throat. Voice change: due to nasal lumps.   Eyes: Negative for visual disturbance.  Respiratory: Negative for cough, chest tightness and shortness of breath.   Cardiovascular: Negative for chest pain, palpitations and leg swelling.  Gastrointestinal: Negative for nausea, vomiting, abdominal pain, diarrhea, constipation and blood in stool.  Genitourinary: Negative for dysuria.  Musculoskeletal: Negative for arthralgias.  Neurological: Negative for dizziness, tremors, numbness and headaches.  Psychiatric/Behavioral: Negative for sleep disturbance. The patient is not nervous/anxious.     Filed Vitals:   05/12/15 1538  BP: 150/90  Pulse: 85  Temp: 98.9 F (37.2 C)  TempSrc: Oral  Resp: 20  Height: 4\' 9"  (1.448 m)  Weight: 141 lb 6.4 oz (64.139 kg)  SpO2: 97%   Body mass index is 30.59 kg/(m^2).  Physical Exam  Constitutional: She is oriented to person, place, and time. She appears well-developed and well-nourished.  Sounds nasally and breathes through her mouth  HENT:  Nose: Mucosal edema, rhinorrhea, sinus tenderness and nasal deformity present.    Mouth/Throat: Oropharynx is clear and moist and mucous membranes are normal. No oral lesions. Abnormal dentition. Dental caries present. No dental abscesses or uvula swelling. No oropharyngeal exudate.  Eyes: Pupils are equal, round, and reactive to light. No scleral icterus.  Neck: Neck supple. Carotid bruit is not present. No tracheal deviation present. No thyromegaly present.  Cardiovascular: Normal rate, regular rhythm, normal heart sounds and intact distal pulses.  Exam reveals no gallop and no friction rub.   No murmur heard. No LE edema b/l. no calf TTP.   Pulmonary/Chest: Effort normal. No stridor. No respiratory distress. She has wheezes. She has no rales.  Abdominal: Soft. Bowel  sounds are normal. She exhibits no distension and no mass. There is no hepatomegaly. There is no tenderness. There is no rebound and no guarding.  Lymphadenopathy:    She has no cervical adenopathy.  Neurological: She is alert and oriented to person, place, and time. She has normal reflexes.  Skin: Skin is warm and dry. No rash noted.  Psychiatric: She has a normal mood and affect. Her behavior is normal. Judgment and thought content normal.     Labs reviewed: No visits with results within 3 Month(s) from this visit. Latest known visit with results is:  Office Visit on 06/23/2014  Component Date Value Ref Range Status  . DIAGNOSIS: 06/23/2014 Comment  Final   Comment: NEGATIVE FOR INTRAEPITHELIAL LESION AND MALIGNANCY. TRICHOMONAS VAGINALIS IS PRESENT.   Marland Kitchen Specimen adequacy: 06/23/2014 Comment   Final   Comment: Satisfactory for evaluation. Endocervical and/or squamous metaplastic cells (endocervical component) are present.   Marland Kitchen CLINICIAN PROVIDED ICD10: 06/23/2014 Comment   Final   Comment: 180.9 C53.9   . Performed by: 06/23/2014 Comment   Final   An Hulen Luster, Cytotechnologist (ASCP)  . QC reviewed by: 06/23/2014 Comment   Final   Cathlyn Parsons, Supervisory Cytotechnologist (ASCP)  . PAP SMEAR COMMENT 06/23/2014 .   Final  . Note: 06/23/2014 Comment   Final   Comment: The Pap smear is a screening test designed to aid in the detection of premalignant and malignant conditions of the uterine cervix.  It is not a diagnostic procedure and should not be used as the sole means of detecting cervical cancer.  Both false-positive and false-negative reports do occur.   . Test Methodology 06/23/2014 Comment   Final   Comment: This liquid based ThinPrep(R) pap test was screened with the use of an image guided system.     No results found.   Assessment/Plan   ICD-9-CM ICD-10-CM   1. Multiple nasal polyps - unknown etiology but has hx all rhinitis 471.9 J33.9 Ambulatory referral to  ENT  2. Allergic rhinitis, unspecified allergic rhinitis type - stable 477.9 J30.9   3. Essential hypertension - suboptimally controlled 401.9 I10 CMP     CBC with Differential     amLODipine (NORVASC) 10 MG tablet  4. Asthma, chronic, moderate persistent, with status asthmaticus - stable 493.91 J45.42   5. Dental caries due to poor dentition 521.00 K02.9 Ambulatory referral to Dentistry    Increase amlodipine 10mg  daily  Continue other medications as ordered  Follow up in 1 month for blood pressure check  Zamariyah Furukawa S. Perlie Gold  [facility] [address] [place], [place] [address] [phone] Cell (Monday-Friday 8 AM - 5 PM) [phone] After 5 PM and follow prompts \

## 2015-05-12 NOTE — Patient Instructions (Addendum)
Increase amlodipine 10mg  daily  Will call with ENT appt for nasal lumps  Will call with lab results  Continue other medications as ordered  Follow up in 1 month for blood pressure check

## 2015-05-13 LAB — CBC WITH DIFFERENTIAL/PLATELET
BASOS: 1 %
Basophils Absolute: 0.1 10*3/uL (ref 0.0–0.2)
EOS (ABSOLUTE): 0.8 10*3/uL — ABNORMAL HIGH (ref 0.0–0.4)
EOS: 10 %
Hematocrit: 40.2 % (ref 34.0–46.6)
Hemoglobin: 13.3 g/dL (ref 11.1–15.9)
Immature Grans (Abs): 0 10*3/uL (ref 0.0–0.1)
Immature Granulocytes: 0 %
LYMPHS ABS: 2.2 10*3/uL (ref 0.7–3.1)
Lymphs: 30 %
MCH: 27.1 pg (ref 26.6–33.0)
MCHC: 33.1 g/dL (ref 31.5–35.7)
MCV: 82 fL (ref 79–97)
MONOS ABS: 0.6 10*3/uL (ref 0.1–0.9)
Monocytes: 8 %
NEUTROS ABS: 3.6 10*3/uL (ref 1.4–7.0)
Neutrophils: 51 %
Platelets: 300 10*3/uL (ref 150–379)
RBC: 4.9 x10E6/uL (ref 3.77–5.28)
RDW: 14 % (ref 12.3–15.4)
WBC: 7.3 10*3/uL (ref 3.4–10.8)

## 2015-05-13 LAB — COMPREHENSIVE METABOLIC PANEL
ALT: 16 IU/L (ref 0–32)
AST: 18 IU/L (ref 0–40)
Albumin/Globulin Ratio: 1.5 (ref 1.1–2.5)
Albumin: 4.2 g/dL (ref 3.5–5.5)
Alkaline Phosphatase: 109 IU/L (ref 39–117)
BUN/Creatinine Ratio: 11 (ref 9–23)
BUN: 7 mg/dL (ref 6–24)
Bilirubin Total: 0.4 mg/dL (ref 0.0–1.2)
CO2: 25 mmol/L (ref 18–29)
CREATININE: 0.62 mg/dL (ref 0.57–1.00)
Calcium: 9.6 mg/dL (ref 8.7–10.2)
Chloride: 100 mmol/L (ref 97–108)
GFR, EST AFRICAN AMERICAN: 117 mL/min/{1.73_m2} (ref >59)
GFR, EST NON AFRICAN AMERICAN: 101 mL/min/{1.73_m2} (ref >59)
Globulin, Total: 2.8 g/dL (ref 1.5–4.5)
Glucose: 94 mg/dL (ref 65–99)
Potassium: 3.4 mmol/L — ABNORMAL LOW (ref 3.5–5.2)
SODIUM: 140 mmol/L (ref 134–144)
Total Protein: 7 g/dL (ref 6.0–8.5)

## 2015-05-17 ENCOUNTER — Ambulatory Visit: Payer: 59 | Admitting: Internal Medicine

## 2015-05-26 ENCOUNTER — Telehealth: Payer: Self-pay | Admitting: Internal Medicine

## 2015-05-26 NOTE — Telephone Encounter (Signed)
FYI - referral was placed for ENT, [facility] has made several attempts to schedule patient and our office has called patient multiple times. Patient not returning phone calls   Patient has a follow up appt 06/14/15 with you

## 2015-05-26 NOTE — Telephone Encounter (Signed)
noted 

## 2015-05-27 ENCOUNTER — Other Ambulatory Visit: Payer: Self-pay | Admitting: Internal Medicine

## 2015-06-14 ENCOUNTER — Ambulatory Visit (INDEPENDENT_AMBULATORY_CARE_PROVIDER_SITE_OTHER): Payer: 59 | Admitting: Internal Medicine

## 2015-06-14 ENCOUNTER — Encounter: Payer: Self-pay | Admitting: Internal Medicine

## 2015-06-14 VITALS — BP 110/76 | HR 84 | Temp 97.8°F | Resp 20 | Ht <= 58 in | Wt 141.8 lb

## 2015-06-14 DIAGNOSIS — F411 Generalized anxiety disorder: Secondary | ICD-10-CM

## 2015-06-14 DIAGNOSIS — J309 Allergic rhinitis, unspecified: Secondary | ICD-10-CM

## 2015-06-14 DIAGNOSIS — I10 Essential (primary) hypertension: Secondary | ICD-10-CM | POA: Diagnosis not present

## 2015-06-14 MED ORDER — CITALOPRAM HYDROBROMIDE 10 MG PO TABS: 10.0000 mg | ORAL_TABLET | Freq: Every day | ORAL | Status: DC

## 2015-06-14 NOTE — Patient Instructions (Signed)
Recommend you take citalopram at bedtime  Continue other medications as ordered  Follow up with ENT as scheduled for nasal polyps  Follow up in 1 month to reck anxiety

## 2015-06-14 NOTE — Progress Notes (Signed)
Patient ID: Tammie [name], female   DOB: 1960/11/14, 54 y.o.   MRN: [id number]    Location:    [facility]   Place of Service:  OFFICE   Chief Complaint  Patient presents with  . Medical Management of Chronic Issues    1 month follow-up for blood pressure, labs printed    HPI:  54 yo female seen today for f/u HTN. She reports feeling ok overall. No leg swelling. Intermittent HA but reports that is due to stress as she had to move. No CP. No dizziness. She has not rec'd appt for ENT yet. She worries a lot and has difficulty sleeping at night. She has an adult son with ADHD. No asthma exacerbations since last OV  Past Medical History  Diagnosis Date  . Hypertension   . Environmental allergies   . Asthma   . Shortness of breath   . Respiratory failure, acute ([place]) 04/19/2014  . Anxiety   . Headache(784.0)   . GERD (gastroesophageal reflux disease)     Past Surgical History  Procedure Laterality Date  . Abdominal hysterectomy  [facility]     Patient Care Team: Gildardo Cranker, DO as PCP - General (Internal Medicine)  Social History   Social History  . Marital Status: Single    Spouse Name: N/A  . Number of Children: N/A  . Years of Education: N/A   Occupational History  . Not on file.   Social History Main Topics  . Smoking status: Never Smoker   . Smokeless tobacco: Never Used  . Alcohol Use: No  . Drug Use: No  . Sexual Activity: No   Other Topics Concern  . Not on file   Social History Narrative   Diet-Salt Free   Drinks [employer]    Lives in an apartment, one stories, one person in home, no pets   Current/past profession: Dietary, dish-room, and cooks    Exercises 2 x weekly                 reports that she has never smoked. She has never used smokeless tobacco. She reports that she does not drink alcohol or use illicit drugs.  Allergies  Allergen Reactions  . Ace Inhibitors Other (See Comments)    Angioedema  . Aspirin Anaphylaxis,  Swelling and Other (See Comments)    Throat swelling   . Ibuprofen Anaphylaxis, Swelling and Other (See Comments)    Throat swelling  . Nsaids Anaphylaxis, Swelling and Other (See Comments)  . Lipitor [Atorvastatin] Swelling and Other (See Comments)    Face, feet swell    Medications: Patient's Medications  New Prescriptions   CITALOPRAM (CELEXA) 10 MG TABLET    Take 1 tablet (10 mg total) by mouth daily.  Previous Medications   ALBUTEROL (PROVENTIL HFA;VENTOLIN HFA) 108 (90 BASE) MCG/ACT INHALER    Inhale 2 puffs into the lungs every 4 (four) hours as needed for wheezing (bronchitis).   AMLODIPINE (NORVASC) 10 MG TABLET    Take 1 tablet (10 mg total) by mouth daily. For high blood pressure   BISOPROLOL (ZEBETA) 5 MG TABLET    TAKE 1/2 TABLET BY MOUTH EVERY DAY FOR HIGH BLOOD PRESSURE   CETIRIZINE (ZYRTEC) 10 MG TABLET    Take 1 tablet (10 mg total) by mouth daily.   FLUTICASONE (FLONASE) 50 MCG/ACT NASAL SPRAY    Place 2 sprays into both nostrils daily. For allergic rhinitis   FLUTICASONE-SALMETEROL (ADVAIR) 250-50 MCG/DOSE AEPB  Inhale 1 puff into the lungs 2 (two) times daily.   HYDRALAZINE (APRESOLINE) 25 MG TABLET    1 by mouth every 8 hours for high blood pressure   MONTELUKAST (SINGULAIR) 10 MG TABLET    TAKE 1 TABLET (10 MG TOTAL) BY MOUTH AT BEDTIME.  Modified Medications   No medications on file  Discontinued Medications   No medications on file    Review of Systems  Neurological: Positive for headaches.  Psychiatric/Behavioral: Positive for sleep disturbance. The patient is nervous/anxious.   All other systems reviewed and are negative.   Filed Vitals:   06/14/15 0813  BP: 110/76  Pulse: 84  Temp: 97.8 F (36.6 C)  TempSrc: Oral  Resp: 20  Height: 4\' 9"  (1.448 m)  Weight: 141 lb 12.8 oz (64.32 kg)  SpO2: 97%   Body mass index is 30.68 kg/(m^2).  Physical Exam  Constitutional: She appears well-developed and well-nourished. No distress.  Speech sounds  nasal  HENT:  Nose: Mucosal edema and nasal deformity present.  Yellow nasal d/c  Cardiovascular: Normal rate, regular rhythm, intact distal pulses and normal pulses.  Exam reveals no gallop, no distant heart sounds and no friction rub.   Murmur heard.  Systolic murmur is present with a grade of 1/6  No LE swelling. No calf TTP  Pulmonary/Chest: Effort normal and breath sounds normal. No accessory muscle usage. No respiratory distress. She has no wheezes. She has no rhonchi. She has no rales.  Neurological: She is alert.  Skin: Skin is warm and dry. No rash noted.  Psychiatric: Her speech is normal and behavior is normal. Thought content normal. Her mood appears anxious.     Labs reviewed: Office Visit on 05/12/2015  Component Date Value Ref Range Status  . Glucose 05/12/2015 94  65 - 99 mg/dL Final  . BUN 05/12/2015 7  6 - 24 mg/dL Final  . Creatinine, Ser 05/12/2015 0.62  0.57 - 1.00 mg/dL Final  . GFR calc non Af Amer 05/12/2015 101  >59 mL/min/1.73 Final  . GFR calc Af Amer 05/12/2015 117  >59 mL/min/1.73 Final  . BUN/Creatinine Ratio 05/12/2015 11  9 - 23 Final  . Sodium 05/12/2015 140  134 - 144 mmol/L Final   Comment: **Effective May 22, 2015 the reference interval**   for Sodium, Serum will be changing to:                                             136 - 144   . Potassium 05/12/2015 3.4* 3.5 - 5.2 mmol/L Final   Comment: **Effective May 22, 2015 the reference interval**   for Potassium, Serum will be changing to:                          0 -  7 days        3.7 - 5.2                          8 - 30 days        3.7 - 6.4                          1 -  6 months      3.8 - 6.0  7 months -  1 year        3.8 - 5.3                              >1 year        3.5 - 5.2   . Chloride 05/12/2015 100  97 - 108 mmol/L Final   Comment: **Effective May 22, 2015 the reference interval**   for Chloride, Serum will be changing to:                                               97 - 106   . CO2 05/12/2015 25  18 - 29 mmol/L Final  . Calcium 05/12/2015 9.6  8.7 - 10.2 mg/dL Final  . Total Protein 05/12/2015 7.0  6.0 - 8.5 g/dL Final  . Albumin 05/12/2015 4.2  3.5 - 5.5 g/dL Final  . Globulin, Total 05/12/2015 2.8  1.5 - 4.5 g/dL Final  . Albumin/Globulin Ratio 05/12/2015 1.5  1.1 - 2.5 Final  . Bilirubin Total 05/12/2015 0.4  0.0 - 1.2 mg/dL Final  . Alkaline Phosphatase 05/12/2015 109  39 - 117 IU/L Final  . AST 05/12/2015 18  0 - 40 IU/L Final  . ALT 05/12/2015 16  0 - 32 IU/L Final  . WBC 05/12/2015 7.3  3.4 - 10.8 x10E3/uL Final  . RBC 05/12/2015 4.90  3.77 - 5.28 x10E6/uL Final  . Hemoglobin 05/12/2015 13.3  11.1 - 15.9 g/dL Final  . Hematocrit 05/12/2015 40.2  34.0 - 46.6 % Final  . MCV 05/12/2015 82  79 - 97 fL Final  . MCH 05/12/2015 27.1  26.6 - 33.0 pg Final  . MCHC 05/12/2015 33.1  31.5 - 35.7 g/dL Final  . RDW 05/12/2015 14.0  12.3 - 15.4 % Final  . Platelets 05/12/2015 300  150 - 379 x10E3/uL Final  . Neutrophils 05/12/2015 51   Final  . Lymphs 05/12/2015 30   Final  . Monocytes 05/12/2015 8   Final  . Eos 05/12/2015 10   Final  . Basos 05/12/2015 1   Final  . Neutrophils Absolute 05/12/2015 3.6  1.4 - 7.0 x10E3/uL Final  . Lymphocytes Absolute 05/12/2015 2.2  0.7 - 3.1 x10E3/uL Final  . Monocytes Absolute 05/12/2015 0.6  0.1 - 0.9 x10E3/uL Final  . EOS (ABSOLUTE) 05/12/2015 0.8* 0.0 - 0.4 x10E3/uL Final  . Basophils Absolute 05/12/2015 0.1  0.0 - 0.2 x10E3/uL Final  . Immature Granulocytes 05/12/2015 0   Final  . Immature Grans (Abs) 05/12/2015 0.0  0.0 - 0.1 x10E3/uL Final    No results found.   Assessment/Plan   ICD-9-CM ICD-10-CM   1. Generalized anxiety disorder - failing to change as expected 300.02 F41.1 citalopram (CELEXA) 10 MG tablet  2. Essential hypertension - improved 401.9 I10   3. Allergic rhinitis, unspecified allergic rhinitis type - with elevated eosinophils; stable 477.9 J30.9     Recommend you  take citalopram at bedtime  Continue other medications as ordered  Follow up with ENT as scheduled for nasal polyps  Follow up in 1 month to reck anxiety  Omarrion Carmer S. Perlie Gold  [facility] [address] [place], [place] [address] [phone] Cell (Monday-Friday 8 AM - 5 PM) [phone] After 5  PM and follow prompts

## 2015-06-18 ENCOUNTER — Other Ambulatory Visit: Payer: Self-pay | Admitting: Internal Medicine

## 2015-06-26 ENCOUNTER — Other Ambulatory Visit: Payer: Self-pay | Admitting: Nurse Practitioner

## 2015-07-14 ENCOUNTER — Ambulatory Visit: Payer: 59 | Admitting: Internal Medicine

## 2015-07-21 ENCOUNTER — Ambulatory Visit (INDEPENDENT_AMBULATORY_CARE_PROVIDER_SITE_OTHER): Payer: 59 | Admitting: Internal Medicine

## 2015-07-21 ENCOUNTER — Encounter: Payer: Self-pay | Admitting: Internal Medicine

## 2015-07-21 VITALS — BP 130/70 | HR 77 | Temp 97.9°F | Resp 20 | Ht <= 58 in | Wt 137.8 lb

## 2015-07-21 DIAGNOSIS — J339 Nasal polyp, unspecified: Secondary | ICD-10-CM | POA: Diagnosis not present

## 2015-07-21 DIAGNOSIS — F411 Generalized anxiety disorder: Secondary | ICD-10-CM | POA: Diagnosis not present

## 2015-07-21 MED ORDER — CITALOPRAM HYDROBROMIDE 20 MG PO TABS: 20.0000 mg | ORAL_TABLET | Freq: Every day | ORAL | Status: DC

## 2015-07-21 NOTE — Progress Notes (Signed)
Patient ID: Jatziry Heffelfinger, female   DOB: [birth year], 54 y.o.   MRN: [id number]    Location:    [facility]   Place of Service:  OFFICE   Chief Complaint  Patient presents with  . Medical Management of Chronic Issues    1 month follow-up for Aniexty  . OTHER    sister in room with patient    HPI:  54 yo female seen today for f/u anxiety. She reports sx's improved but still has breakthrough sx's of increased worry and sleeplessness. No panic attacks.   Nasal mass - ready for ENT referral  Sister present  Past Medical History  Diagnosis Date  . Hypertension   . Environmental allergies   . Asthma   . Shortness of breath   . Respiratory failure, acute ([place]) 04/19/2014  . Anxiety   . Headache(784.0)   . GERD (gastroesophageal reflux disease)     Past Surgical History  Procedure Laterality Date  . Abdominal hysterectomy  [facility]     Patient Care Team: Gildardo Cranker, DO as PCP - General (Internal Medicine)  Social History   Social History  . Marital Status: Single    Spouse Name: N/A  . Number of Children: N/A  . Years of Education: N/A   Occupational History  . Not on file.   Social History Main Topics  . Smoking status: Never Smoker   . Smokeless tobacco: Never Used  . Alcohol Use: No  . Drug Use: No  . Sexual Activity: No   Other Topics Concern  . Not on file   Social History Narrative   Diet-Salt Free   Drinks [employer]    Lives in an apartment, one stories, one person in home, no pets   Current/past profession: Dietary, dish-room, and cooks    Exercises 2 x weekly                 reports that she has never smoked. She has never used smokeless tobacco. She reports that she does not drink alcohol or use illicit drugs.  Allergies  Allergen Reactions  . Ace Inhibitors Other (See Comments)    Angioedema  . Aspirin Anaphylaxis, Swelling and Other (See Comments)    Throat swelling   . Ibuprofen Anaphylaxis, Swelling and Other (See  Comments)    Throat swelling  . Nsaids Anaphylaxis, Swelling and Other (See Comments)  . Lipitor [Atorvastatin] Swelling and Other (See Comments)    Face, feet swell    Medications: Patient's Medications  New Prescriptions   No medications on file  Previous Medications   ALBUTEROL (PROVENTIL HFA;VENTOLIN HFA) 108 (90 BASE) MCG/ACT INHALER    Inhale 2 puffs into the lungs every 4 (four) hours as needed for wheezing (bronchitis).   AMLODIPINE (NORVASC) 10 MG TABLET    Take 1 tablet (10 mg total) by mouth daily. For high blood pressure   BISOPROLOL (ZEBETA) 5 MG TABLET    TAKE 1/2 TABLET BY MOUTH EVERY DAY FOR HIGH BLOOD PRESSURE   BISOPROLOL (ZEBETA) 5 MG TABLET    TAKE 1/2 TABLET BY MOUTH ONCE DAILY FOR HIGH BLOOD PRESSURE   CETIRIZINE (ZYRTEC) 10 MG TABLET    Take 1 tablet (10 mg total) by mouth daily.   CITALOPRAM (CELEXA) 10 MG TABLET    Take 1 tablet (10 mg total) by mouth daily.   FLUTICASONE (FLONASE) 50 MCG/ACT NASAL SPRAY    Place 2 sprays into both nostrils daily. For allergic rhinitis  HYDRALAZINE (APRESOLINE) 25 MG TABLET    TAKE 1 TABLET BY MOUTH EVERY 8 HOURS FOR HIGH BLOOD PRESSURE   MONTELUKAST (SINGULAIR) 10 MG TABLET    TAKE 1 TABLET (10 MG TOTAL) BY MOUTH AT BEDTIME.  Modified Medications   No medications on file  Discontinued Medications   FLUTICASONE-SALMETEROL (ADVAIR) 250-50 MCG/DOSE AEPB    Inhale 1 puff into the lungs 2 (two) times daily.    Review of Systems  Constitutional: Negative for fever, chills, diaphoresis, activity change, appetite change and fatigue.  HENT: Negative for ear pain and sore throat.   Eyes: Negative for visual disturbance.  Respiratory: Negative for cough, chest tightness and shortness of breath.   Cardiovascular: Negative for chest pain, palpitations and leg swelling.  Gastrointestinal: Negative for nausea, vomiting, abdominal pain, diarrhea, constipation and blood in stool.  Genitourinary: Negative for dysuria.  Musculoskeletal:  Negative for arthralgias.  Neurological: Negative for dizziness, tremors, numbness and headaches.  Psychiatric/Behavioral: Positive for sleep disturbance. The patient is nervous/anxious.     Filed Vitals:   07/21/15 1549  BP: 130/70  Pulse: 77  Temp: 97.9 F (36.6 C)  TempSrc: Oral  Resp: 20  Height: 4\' 9"  (1.448 m)  Weight: 137 lb 12.8 oz (62.506 kg)  SpO2: 94%   Body mass index is 29.81 kg/(m^2).  Physical Exam  Constitutional: She appears well-developed and well-nourished. No distress.  HENT:  Prominent nasal polyps b/l  Cardiovascular: Normal rate, regular rhythm, normal heart sounds and intact distal pulses.  Exam reveals no gallop and no friction rub.   No murmur heard. No LE edema b/l. No calf TTP  Pulmonary/Chest: Effort normal and breath sounds normal. No respiratory distress. She has no wheezes. She has no rales.  Neurological: She is alert.  Skin: Skin is warm and dry. No rash noted.  Psychiatric: She has a normal mood and affect. Her behavior is normal. Thought content normal.     Labs reviewed: Office Visit on 05/12/2015  Component Date Value Ref Range Status  . Glucose 05/12/2015 94  65 - 99 mg/dL Final  . BUN 05/12/2015 7  6 - 24 mg/dL Final  . Creatinine, Ser 05/12/2015 0.62  0.57 - 1.00 mg/dL Final  . GFR calc non Af Amer 05/12/2015 101  >59 mL/min/1.73 Final  . GFR calc Af Amer 05/12/2015 117  >59 mL/min/1.73 Final  . BUN/Creatinine Ratio 05/12/2015 11  9 - 23 Final  . Sodium 05/12/2015 140  134 - 144 mmol/L Final   Comment: **Effective May 22, 2015 the reference interval**   for Sodium, Serum will be changing to:                                             136 - 144   . Potassium 05/12/2015 3.4* 3.5 - 5.2 mmol/L Final   Comment: **Effective May 22, 2015 the reference interval**   for Potassium, Serum will be changing to:                          0 -  7 days        3.7 - 5.2                          8 - 30 days        3.7 -  6.4                           1 -  6 months      3.8 - 6.0                   7 months -  1 year        3.8 - 5.3                              >1 year        3.5 - 5.2   . Chloride 05/12/2015 100  97 - 108 mmol/L Final   Comment: **Effective May 22, 2015 the reference interval**   for Chloride, Serum will be changing to:                                              97 - 106   . CO2 05/12/2015 25  18 - 29 mmol/L Final  . Calcium 05/12/2015 9.6  8.7 - 10.2 mg/dL Final  . Total Protein 05/12/2015 7.0  6.0 - 8.5 g/dL Final  . Albumin 05/12/2015 4.2  3.5 - 5.5 g/dL Final  . Globulin, Total 05/12/2015 2.8  1.5 - 4.5 g/dL Final  . Albumin/Globulin Ratio 05/12/2015 1.5  1.1 - 2.5 Final  . Bilirubin Total 05/12/2015 0.4  0.0 - 1.2 mg/dL Final  . Alkaline Phosphatase 05/12/2015 109  39 - 117 IU/L Final  . AST 05/12/2015 18  0 - 40 IU/L Final  . ALT 05/12/2015 16  0 - 32 IU/L Final  . WBC 05/12/2015 7.3  3.4 - 10.8 x10E3/uL Final  . RBC 05/12/2015 4.90  3.77 - 5.28 x10E6/uL Final  . Hemoglobin 05/12/2015 13.3  11.1 - 15.9 g/dL Final  . Hematocrit 05/12/2015 40.2  34.0 - 46.6 % Final  . MCV 05/12/2015 82  79 - 97 fL Final  . MCH 05/12/2015 27.1  26.6 - 33.0 pg Final  . MCHC 05/12/2015 33.1  31.5 - 35.7 g/dL Final  . RDW 05/12/2015 14.0  12.3 - 15.4 % Final  . Platelets 05/12/2015 300  150 - 379 x10E3/uL Final  . Neutrophils 05/12/2015 51   Final  . Lymphs 05/12/2015 30   Final  . Monocytes 05/12/2015 8   Final  . Eos 05/12/2015 10   Final  . Basos 05/12/2015 1   Final  . Neutrophils Absolute 05/12/2015 3.6  1.4 - 7.0 x10E3/uL Final  . Lymphocytes Absolute 05/12/2015 2.2  0.7 - 3.1 x10E3/uL Final  . Monocytes Absolute 05/12/2015 0.6  0.1 - 0.9 x10E3/uL Final  . EOS (ABSOLUTE) 05/12/2015 0.8* 0.0 - 0.4 x10E3/uL Final  . Basophils Absolute 05/12/2015 0.1  0.0 - 0.2 x10E3/uL Final  . Immature Granulocytes 05/12/2015 0   Final  . Immature Grans (Abs) 05/12/2015 0.0  0.0 - 0.1 x10E3/uL Final    No results  found.   Assessment/Plan   ICD-9-CM ICD-10-CM   1. Generalized anxiety disorder - improving 300.02 F41.1 citalopram (CELEXA) 20 MG tablet  2. Multiple nasal polyps 471.9 J33.9 Ambulatory referral to ENT    Will call with ENT referral  Increase citalopram to 20mg  daily  Follow up in 3 mos for routine visit  Jourdin Connors S. Eulas Post, D. Jenetta Downer., F. A.  Suzzanne Cloud  [facility] [place], [place] [address] [phone] Cell (Monday-Friday 8 AM - 5 PM) [phone] After 5 PM and follow prompts

## 2015-07-21 NOTE — Patient Instructions (Signed)
Will call with ENT referral  Increase citalopram to 20mg  daily  Follow up in 3 mos for routine visit

## 2015-08-10 ENCOUNTER — Encounter: Payer: Self-pay | Admitting: Internal Medicine

## 2015-08-21 ENCOUNTER — Other Ambulatory Visit: Payer: Self-pay | Admitting: *Deleted

## 2015-08-21 ENCOUNTER — Other Ambulatory Visit: Payer: Self-pay | Admitting: Internal Medicine

## 2015-08-21 MED ORDER — HYDRALAZINE HCL 25 MG PO TABS: ORAL_TABLET | ORAL | Status: DC

## 2015-08-21 NOTE — Telephone Encounter (Signed)
[facility] 

## 2015-09-19 ENCOUNTER — Other Ambulatory Visit: Payer: Self-pay | Admitting: Internal Medicine

## 2015-09-20 ENCOUNTER — Telehealth: Payer: Self-pay | Admitting: Internal Medicine

## 2015-09-20 NOTE — Telephone Encounter (Signed)
Sending Mammogram overdue letter..Carolin Coy

## 2015-09-21 ENCOUNTER — Encounter: Payer: Self-pay | Admitting: Internal Medicine

## 2015-10-20 ENCOUNTER — Encounter: Payer: Self-pay | Admitting: Internal Medicine

## 2015-10-20 ENCOUNTER — Ambulatory Visit (INDEPENDENT_AMBULATORY_CARE_PROVIDER_SITE_OTHER): Payer: Medicare Other | Admitting: Internal Medicine

## 2015-10-20 VITALS — BP 106/62 | HR 79 | Temp 97.8°F | Resp 20 | Ht <= 58 in | Wt 138.6 lb

## 2015-10-20 DIAGNOSIS — J309 Allergic rhinitis, unspecified: Secondary | ICD-10-CM

## 2015-10-20 DIAGNOSIS — J4542 Moderate persistent asthma with status asthmaticus: Secondary | ICD-10-CM | POA: Diagnosis not present

## 2015-10-20 DIAGNOSIS — F411 Generalized anxiety disorder: Secondary | ICD-10-CM | POA: Diagnosis not present

## 2015-10-20 DIAGNOSIS — I10 Essential (primary) hypertension: Secondary | ICD-10-CM

## 2015-10-20 DIAGNOSIS — J339 Nasal polyp, unspecified: Secondary | ICD-10-CM

## 2015-10-20 MED ORDER — ALBUTEROL SULFATE HFA 108 (90 BASE) MCG/ACT IN AERS: 2.0000 | INHALATION_SPRAY | RESPIRATORY_TRACT | Status: DC | PRN

## 2015-10-20 NOTE — Progress Notes (Signed)
Patient ID: Tammie [name], female   DOB: [birth year], 55 y.o.   MRN: [id number]    Location:    [facility]   Place of Service:   OFFICE  Chief Complaint  Patient presents with  . Medical Management of Chronic Issues    3 month follow-up for routine visit  . OTHER    Patients c/o polyps in nostrils having some pain and drainage,and neck pain on left side    HPI:  55 yo female seen today for f/u.  Anxiety - stable mood on celexa. No panic attacks.   Asthma /allergic rhinitis- stable now but used it frequently this past summer. She uses albuterol HFA BID and takes singulair and zyrtec. She uses flonase daily  HTN - BP controlled on amlodipine and bisoprolol  Nasal mass vs polyps - She is c/a lumps in her nose x >6 mos. Painful nares. No known trauma. (+) clear rhinorrhea "like a faucet". She has not seen ENT yet as she had to change medical insurance policy and was having financial issues. She cancelled last 2 referrals  Past Medical History  Diagnosis Date  . Hypertension   . Environmental allergies   . Asthma   . Shortness of breath   . Respiratory failure, acute ([place]) 04/19/2014  . Anxiety   . Headache(784.0)   . GERD (gastroesophageal reflux disease)     Past Surgical History  Procedure Laterality Date  . Abdominal hysterectomy  [facility]     Patient Care Team: Gildardo Cranker, DO as PCP - General (Internal Medicine)  Social History   Social History  . Marital Status: Single    Spouse Name: N/A  . Number of Children: N/A  . Years of Education: N/A   Occupational History  . Not on file.   Social History Main Topics  . Smoking status: Never Smoker   . Smokeless tobacco: Never Used  . Alcohol Use: No  . Drug Use: No  . Sexual Activity: No   Other Topics Concern  . Not on file   Social History Narrative   Diet-Salt Free   Drinks [employer]    Lives in an apartment, one stories, one person in home, no pets   Current/past profession: Dietary,  dish-room, and cooks    Exercises 2 x weekly                 reports that she has never smoked. She has never used smokeless tobacco. She reports that she does not drink alcohol or use illicit drugs.  Allergies  Allergen Reactions  . Ace Inhibitors Other (See Comments)    Angioedema  . Aspirin Anaphylaxis, Swelling and Other (See Comments)    Throat swelling   . Ibuprofen Anaphylaxis, Swelling and Other (See Comments)    Throat swelling  . Nsaids Anaphylaxis, Swelling and Other (See Comments)  . Lipitor [Atorvastatin] Swelling and Other (See Comments)    Face, feet swell    Medications: Patient's Medications  New Prescriptions   No medications on file  Previous Medications   ALBUTEROL (PROVENTIL HFA;VENTOLIN HFA) 108 (90 BASE) MCG/ACT INHALER    Inhale 2 puffs into the lungs every 4 (four) hours as needed for wheezing (bronchitis).   AMLODIPINE (NORVASC) 10 MG TABLET    Take 1 tablet (10 mg total) by mouth daily. For high blood pressure   BISOPROLOL (ZEBETA) 5 MG TABLET    TAKE 1/2 TABLET BY MOUTH ONCE DAILY FOR HIGH BLOOD PRESSURE   CETIRIZINE (  ZYRTEC) 10 MG TABLET    TAKE 1 TABLET(10 MG) BY MOUTH DAILY   CITALOPRAM (CELEXA) 20 MG TABLET    Take 1 tablet (20 mg total) by mouth daily.   FLUTICASONE (FLONASE) 50 MCG/ACT NASAL SPRAY    Place 2 sprays into both nostrils daily. For allergic rhinitis   HYDRALAZINE (APRESOLINE) 25 MG TABLET    Take one tablet by mouth every 8 hours for high blood pressure   MONTELUKAST (SINGULAIR) 10 MG TABLET    TAKE 1 TABLET (10 MG TOTAL) BY MOUTH AT BEDTIME.  Modified Medications   No medications on file  Discontinued Medications   BISOPROLOL (ZEBETA) 5 MG TABLET    TAKE 1/2 TABLET BY MOUTH EVERY DAY FOR HIGH BLOOD PRESSURE    Review of Systems  Constitutional: Negative for fever, chills, diaphoresis, activity change, appetite change and fatigue.  HENT: Positive for postnasal drip and rhinorrhea. Negative for ear pain and sore throat.     Eyes: Negative for visual disturbance.  Respiratory: Negative for cough, chest tightness and shortness of breath.   Cardiovascular: Negative for chest pain, palpitations and leg swelling.  Gastrointestinal: Negative for nausea, vomiting, abdominal pain, diarrhea, constipation and blood in stool.  Genitourinary: Negative for dysuria.  Musculoskeletal: Negative for arthralgias.  Neurological: Negative for dizziness, tremors, numbness and headaches.  Psychiatric/Behavioral: Negative for sleep disturbance. The patient is nervous/anxious.     Filed Vitals:   10/20/15 1018  BP: 106/62  Pulse: 79  Temp: 97.8 F (36.6 C)  TempSrc: Oral  Resp: 20  Height: 4\' 9"  (1.448 m)  Weight: 138 lb 9.6 oz (62.869 kg)  SpO2: 93%   Body mass index is 29.98 kg/(m^2).  Physical Exam  Constitutional: She is oriented to person, place, and time. She appears well-developed and well-nourished.  HENT:  Mouth/Throat: Oropharynx is clear and moist. No oropharyngeal exudate.  B/l nasal polyps protruding, grey mucosa. No bleeding  Eyes: Pupils are equal, round, and reactive to light. No scleral icterus.  Neck: Neck supple. Carotid bruit is not present. No tracheal deviation present.  Cardiovascular: Normal rate, regular rhythm, normal heart sounds and intact distal pulses.  Exam reveals no gallop and no friction rub.   No murmur heard. No LE edema b/l. no calf TTP.   Pulmonary/Chest: Effort normal and breath sounds normal. No stridor. No respiratory distress. She has no wheezes. She has no rales.  Abdominal: Soft. Bowel sounds are normal. She exhibits no distension and no mass. There is no hepatomegaly. There is no tenderness. There is no rebound and no guarding.  obese  Lymphadenopathy:    She has no cervical adenopathy.  Neurological: She is alert and oriented to person, place, and time. She has normal reflexes.  Skin: Skin is warm and dry. No rash noted.  Psychiatric: She has a normal mood and affect. Her  behavior is normal. Judgment and thought content normal.     Labs reviewed: No visits with results within 3 Month(s) from this visit. Latest known visit with results is:  Office Visit on 05/12/2015  Component Date Value Ref Range Status  . Glucose 05/12/2015 94  65 - 99 mg/dL Final  . BUN 05/12/2015 7  6 - 24 mg/dL Final  . Creatinine, Ser 05/12/2015 0.62  0.57 - 1.00 mg/dL Final  . GFR calc non Af Amer 05/12/2015 101  >59 mL/min/1.73 Final  . GFR calc Af Amer 05/12/2015 117  >59 mL/min/1.73 Final  . BUN/Creatinine Ratio 05/12/2015 11  9 - 23  Final  . Sodium 05/12/2015 140  134 - 144 mmol/L Final   Comment: **Effective May 22, 2015 the reference interval**   for Sodium, Serum will be changing to:                                             136 - 144   . Potassium 05/12/2015 3.4* 3.5 - 5.2 mmol/L Final   Comment: **Effective May 22, 2015 the reference interval**   for Potassium, Serum will be changing to:                          0 -  7 days        3.7 - 5.2                          8 - 30 days        3.7 - 6.4                          1 -  6 months      3.8 - 6.0                   7 months -  1 year        3.8 - 5.3                              >1 year        3.5 - 5.2   . Chloride 05/12/2015 100  97 - 108 mmol/L Final   Comment: **Effective May 22, 2015 the reference interval**   for Chloride, Serum will be changing to:                                              97 - 106   . CO2 05/12/2015 25  18 - 29 mmol/L Final  . Calcium 05/12/2015 9.6  8.7 - 10.2 mg/dL Final  . Total Protein 05/12/2015 7.0  6.0 - 8.5 g/dL Final  . Albumin 05/12/2015 4.2  3.5 - 5.5 g/dL Final  . Globulin, Total 05/12/2015 2.8  1.5 - 4.5 g/dL Final  . Albumin/Globulin Ratio 05/12/2015 1.5  1.1 - 2.5 Final  . Bilirubin Total 05/12/2015 0.4  0.0 - 1.2 mg/dL Final  . Alkaline Phosphatase 05/12/2015 109  39 - 117 IU/L Final  . AST 05/12/2015 18  0 - 40 IU/L Final  . ALT 05/12/2015 16  0 - 32  IU/L Final  . WBC 05/12/2015 7.3  3.4 - 10.8 x10E3/uL Final  . RBC 05/12/2015 4.90  3.77 - 5.28 x10E6/uL Final  . Hemoglobin 05/12/2015 13.3  11.1 - 15.9 g/dL Final  . Hematocrit 05/12/2015 40.2  34.0 - 46.6 % Final  . MCV 05/12/2015 82  79 - 97 fL Final  . MCH 05/12/2015 27.1  26.6 - 33.0 pg Final  . MCHC 05/12/2015 33.1  31.5 - 35.7 g/dL Final  . RDW 05/12/2015 14.0  12.3 - 15.4 % Final  . Platelets 05/12/2015 300  150 - 379 x10E3/uL Final  . Neutrophils 05/12/2015 51  Final  . Lymphs 05/12/2015 30   Final  . Monocytes 05/12/2015 8   Final  . Eos 05/12/2015 10   Final  . Basos 05/12/2015 1   Final  . Neutrophils Absolute 05/12/2015 3.6  1.4 - 7.0 x10E3/uL Final  . Lymphocytes Absolute 05/12/2015 2.2  0.7 - 3.1 x10E3/uL Final  . Monocytes Absolute 05/12/2015 0.6  0.1 - 0.9 x10E3/uL Final  . EOS (ABSOLUTE) 05/12/2015 0.8* 0.0 - 0.4 x10E3/uL Final  . Basophils Absolute 05/12/2015 0.1  0.0 - 0.2 x10E3/uL Final  . Immature Granulocytes 05/12/2015 0   Final  . Immature Grans (Abs) 05/12/2015 0.0  0.0 - 0.1 x10E3/uL Final    No results found.   Assessment/Plan   ICD-9-CM ICD-10-CM   1. Multiple nasal polyps 471.9 J33.9 Ambulatory referral to ENT  2. Asthma, chronic, moderate persistent, with status asthmaticus 493.91 J45.42 albuterol (PROVENTIL HFA;VENTOLIN HFA) 108 (90 Base) MCG/ACT inhaler  3. Allergic rhinitis, unspecified allergic rhinitis type 477.9 J30.9 Ambulatory referral to ENT  4. Essential hypertension 401.9 [id number] Basic Metabolic Panel  5. Generalized anxiety disorder 300.02 F41.1    May start multivitamin daily   Continue current medications as ordered  Will call with ENT referral appt for nasal polyps  Will call with lab result  NEEDS MAMMOGRAM AND PAP. Will discuss at CPE  Follow up in 3 mos for [place]. Perlie Gold  [facility] [address] [place], [place] [address] [phone] Cell  (Monday-Friday 8 AM - 5 PM) [phone] After 5 PM and follow prompts

## 2015-10-20 NOTE — Patient Instructions (Addendum)
May start multivitamin daily   Continue current medications as ordered  Will call with ENT referral appt for nasal polyps  Will call with lab result  Follow up in 3 mos for CPE/ECG

## 2015-10-21 LAB — BASIC METABOLIC PANEL
BUN / CREAT RATIO: 8 — AB (ref 9–23)
BUN: 7 mg/dL (ref 6–24)
CO2: 27 mmol/L (ref 18–29)
CREATININE: 0.9 mg/dL (ref 0.57–1.00)
Calcium: 9.9 mg/dL (ref 8.7–10.2)
Chloride: 102 mmol/L (ref 96–106)
GFR calc non Af Amer: 72 mL/min/{1.73_m2} (ref >59)
GFR, EST AFRICAN AMERICAN: 83 mL/min/{1.73_m2} (ref >59)
Glucose: 80 mg/dL (ref 65–99)
Potassium: 3.9 mmol/L (ref 3.5–5.2)
Sodium: 143 mmol/L (ref 134–144)

## 2015-11-20 ENCOUNTER — Other Ambulatory Visit: Payer: Self-pay | Admitting: Internal Medicine

## 2015-11-22 DIAGNOSIS — J324 Chronic pansinusitis: Secondary | ICD-10-CM | POA: Insufficient documentation

## 2015-12-13 ENCOUNTER — Other Ambulatory Visit: Payer: Self-pay | Admitting: Internal Medicine

## 2015-12-18 DIAGNOSIS — Z029 Encounter for administrative examinations, unspecified: Secondary | ICD-10-CM

## 2015-12-19 ENCOUNTER — Other Ambulatory Visit: Payer: Self-pay | Admitting: Internal Medicine

## 2016-01-03 ENCOUNTER — Other Ambulatory Visit: Payer: Self-pay | Admitting: Internal Medicine

## 2016-01-03 ENCOUNTER — Telehealth: Payer: Self-pay | Admitting: *Deleted

## 2016-01-03 NOTE — Telephone Encounter (Signed)
Received fax from [facility] requesting form to be filled out and signed to schedule a pulmonary Function Studies, including pre and post bronchodilator and/or DLCO, to assess the severity of individual's alleged or diagnosed respiratory impairment.  Given to Dr. Eulas Post to review and sign with last OV and facesheet To be faxed back to [phone] ATTN Shauna Cobb-Hunter.

## 2016-01-08 ENCOUNTER — Other Ambulatory Visit: Payer: Self-pay | Admitting: Internal Medicine

## 2016-01-18 ENCOUNTER — Other Ambulatory Visit: Payer: Self-pay | Admitting: Internal Medicine

## 2016-02-07 ENCOUNTER — Encounter: Payer: Self-pay | Admitting: Internal Medicine

## 2016-02-07 ENCOUNTER — Ambulatory Visit (INDEPENDENT_AMBULATORY_CARE_PROVIDER_SITE_OTHER): Payer: Medicare Other | Admitting: Internal Medicine

## 2016-02-07 VITALS — BP 122/82 | HR 72 | Temp 97.8°F | Ht <= 58 in | Wt 145.2 lb

## 2016-02-07 DIAGNOSIS — I351 Nonrheumatic aortic (valve) insufficiency: Secondary | ICD-10-CM | POA: Diagnosis not present

## 2016-02-07 DIAGNOSIS — F411 Generalized anxiety disorder: Secondary | ICD-10-CM | POA: Diagnosis not present

## 2016-02-07 DIAGNOSIS — Z1239 Encounter for other screening for malignant neoplasm of breast: Secondary | ICD-10-CM

## 2016-02-07 DIAGNOSIS — I10 Essential (primary) hypertension: Secondary | ICD-10-CM | POA: Diagnosis not present

## 2016-02-07 DIAGNOSIS — J309 Allergic rhinitis, unspecified: Secondary | ICD-10-CM

## 2016-02-07 DIAGNOSIS — J4542 Moderate persistent asthma with status asthmaticus: Secondary | ICD-10-CM

## 2016-02-07 DIAGNOSIS — K029 Dental caries, unspecified: Secondary | ICD-10-CM

## 2016-02-07 DIAGNOSIS — Z0000 Encounter for general adult medical examination without abnormal findings: Secondary | ICD-10-CM | POA: Diagnosis not present

## 2016-02-07 MED ORDER — MONTELUKAST SODIUM 10 MG PO TABS: 10.0000 mg | ORAL_TABLET | Freq: Every day | ORAL | Status: DC

## 2016-02-07 MED ORDER — HYDRALAZINE HCL 25 MG PO TABS: 25.0000 mg | ORAL_TABLET | Freq: Three times a day (TID) | ORAL | Status: DC

## 2016-02-07 MED ORDER — BISOPROLOL FUMARATE 5 MG PO TABS: ORAL_TABLET | ORAL | Status: DC

## 2016-02-07 MED ORDER — CITALOPRAM HYDROBROMIDE 20 MG PO TABS: 20.0000 mg | ORAL_TABLET | Freq: Every day | ORAL | Status: DC

## 2016-02-07 MED ORDER — ALBUTEROL SULFATE HFA 108 (90 BASE) MCG/ACT IN AERS: 2.0000 | INHALATION_SPRAY | RESPIRATORY_TRACT | Status: DC | PRN

## 2016-02-07 NOTE — Progress Notes (Signed)
Patient ID: Tammie [name], female   DOB: June 22, 1961, 55 y.o.   MRN: [id number]   Location:  [facility]  Place of Service:  OFFICE  Provider: Arletha Grippe, DO  Patient Care Team: Gildardo Cranker, DO as PCP - General (Internal Medicine)  Extended Emergency Contact Information Primary Emergency Contact: Gratz,Shirley Address: [address]          [place] [address] [place] Phone: [phone] Relation: Sister Secondary Emergency Contact: Winferd Humphrey [place] Phone: [phone] Relation: Brother  Code Status: FULL CODE Goals of Care: Advanced Directive information Advanced Directives 02/07/2016  Does patient have an advance directive? No  Would patient like information on creating an advanced directive? No - patient declined information     Chief Complaint  Patient presents with  . Annual Exam    Yearly exam    HPI: Patient is a 55 y.o. female seen in today for an annual wellness exam.   Anxiety - stable mood on celexa. No panic attacks.   Asthma /allergic rhinitis- she has used HFA at least 4 times in the last week, including this AM. She uses albuterol HFA BID and takes singulair and zyrtec. She uses flonase daily  HTN - BP controlled on amlodipine and bisoprolol  Nasal mass vs polyps - she is s/p removal in May 2017 by Dr Redmond Baseman, ENT. She is now able to smell. No more rhinorrhea. She has not followed up post op yet  Heart murmur - last 2D echo in 2015 revealed nml EF and mild AR  Depression screen [facility] 2/9 02/07/2016 06/23/2014  Decreased Interest 0 0  Down, Depressed, Hopeless 0 0  PHQ - 2 Score 0 0    Fall Risk  02/07/2016 10/20/2015 07/21/2015 06/14/2015 06/23/2014  Falls in the past year? Yes No No No No  Number falls in past yr: 1 - - - -  Injury with Fall? No - - - -   No flowsheet data found.   Health Maintenance  Topic Date Due  . Hepatitis C Screening  1961/05/15  . TETANUS/TDAP  11/18/1977  . MAMMOGRAM   11/18/2008  . COLONOSCOPY  11/18/2008  . INFLUENZA VACCINE  03/05/2016  . PAP SMEAR  06/23/2017  . HIV Screening  Completed    Urinary incontinence? none  Functional Status Survey: Is the patient deaf or have difficulty hearing?: No Does the patient have difficulty seeing, even when wearing glasses/contacts?: Yes Does the patient have difficulty concentrating, remembering, or making decisions?: No Does the patient have difficulty walking or climbing stairs?: Yes (due to muscle spasms in back with associated tingling) Does the patient have difficulty dressing or bathing?: No Does the patient have difficulty doing errands alone such as visiting a doctor's office or shopping?: Yes (due to dyspnea)  Exercise? No regular routine  Diet? Occasional healthy food choices   Visual Acuity Screening   Right eye Left eye Both eyes  Without correction: 20/40 20/50 20/25   With correction:       Hearing: none    Dentition: she has not seen dentist in 2-3 mos. She has poor dentition  Pain: lower back  Past Medical History  Diagnosis Date  . Hypertension   . Environmental allergies   . Asthma   . Shortness of breath   . Respiratory failure, acute ([place]) 04/19/2014  . Anxiety   . Headache(784.0)   . GERD (gastroesophageal reflux disease)     Past Surgical History  Procedure Laterality Date  .  Abdominal hysterectomy  1993    [facility]     Family History  Problem Relation Age of Onset  . Heart attack Father   . Heart attack Mother   . High blood pressure Son   . Cancer Maternal Grandmother   . Heart disease Paternal Grandmother   . Asthma Other     Great paternal grandfather   . Autism Son   . Diabetes Sister   . High blood pressure Brother   . Hypertension Brother   . High blood pressure Sister   . Cancer Sister   . Asthma Other     Neice    Family Status  Relation Status Death Age  . Father Deceased 58    heart disease  . Mother Deceased 37    heart  disease, dementia  . Sister Alive   . Brother Alive   . Sister Alive     Social History   Social History  . Marital Status: Single    Spouse Name: N/A  . Number of Children: N/A  . Years of Education: N/A   Occupational History  . Not on file.   Social History Main Topics  . Smoking status: Never Smoker   . Smokeless tobacco: Never Used  . Alcohol Use: No  . Drug Use: No  . Sexual Activity: No   Other Topics Concern  . Not on file   Social History Narrative   Diet-Salt Free   Drinks [employer]    Lives in an apartment, one stories, one person in home, no pets   Current/past profession: Dietary, dish-room, and cooks    Exercises 2 x weekly                Allergies  Allergen Reactions  . Ace Inhibitors Other (See Comments)    Angioedema  . Aspirin Anaphylaxis, Swelling and Other (See Comments)    Throat swelling   . Ibuprofen Anaphylaxis, Swelling and Other (See Comments)    Throat swelling  . Nsaids Anaphylaxis, Swelling and Other (See Comments)  . Lipitor [Atorvastatin] Swelling and Other (See Comments)    Face, feet swell      Medication List       This list is accurate as of: 02/07/16  3:40 PM.  Always use your most recent med list.               albuterol 108 (90 Base) MCG/ACT inhaler  Commonly known as:  PROVENTIL HFA;VENTOLIN HFA  Inhale 2 puffs into the lungs every 4 (four) hours as needed for wheezing (bronchitis).     amLODipine 10 MG tablet  Commonly known as:  NORVASC  TAKE 1 TABLET(10 MG) BY MOUTH DAILY FOR HIGH BLOOD PRESSURE     bisoprolol 5 MG tablet  Commonly known as:  ZEBETA  TAKE 1/2 TABLET BY MOUTH EVERY DAY FOR HIGH BLOOD PRESSURE     cetirizine 10 MG tablet  Commonly known as:  ZYRTEC  TAKE 1 TABLET(10 MG) BY MOUTH DAILY     citalopram 20 MG tablet  Commonly known as:  CELEXA  Take 1 tablet (20 mg total) by mouth daily.     fluticasone 50 MCG/ACT nasal spray  Commonly known as:  FLONASE  Place 2 sprays into  both nostrils daily. For allergic rhinitis     hydrALAZINE 25 MG tablet  Commonly known as:  APRESOLINE  TAKE 1 TABLET BY MOUTH EVERY 8 HOURS     montelukast 10 MG tablet  Commonly known  as:  SINGULAIR  TAKE 1 TABLET BY MOUTH AT BEDTIME         Review of Systems:  Review of Systems  Unable to perform ROS: Other  intellectual challenged  Physical Exam: Filed Vitals:   02/07/16 1459  BP: 122/82  Pulse: 72  Temp: 97.8 F (36.6 C)  TempSrc: Oral  Height: 4\' 9"  (1.448 m)  Weight: 145 lb 3.2 oz (65.862 kg)  SpO2: 93%   Body mass index is 31.41 kg/(m^2). Physical Exam  Constitutional: She is oriented to person, place, and time. She appears well-developed and well-nourished. No distress.  HENT:  Head: Normocephalic and atraumatic.  Right Ear: Hearing, tympanic membrane, external ear and ear canal normal.  Left Ear: Hearing, tympanic membrane, external ear and ear canal normal.  Mouth/Throat: Uvula is midline, oropharynx is clear and moist and mucous membranes are normal. She does not have dentures. No oropharyngeal exudate.  Poor dentition  Eyes: Conjunctivae, EOM and lids are normal. Pupils are equal, round, and reactive to light. No scleral icterus.  Neck: Trachea normal and normal range of motion. Neck supple. Carotid bruit is not present. No tracheal deviation present. No thyroid mass and no thyromegaly present.  Cardiovascular: Normal rate, regular rhythm and intact distal pulses.  Exam reveals no gallop and no friction rub.   Murmur (1/6 SEM) heard. No LE edema b/l. no calf TTP.   Pulmonary/Chest: Effort normal and breath sounds normal. No stridor. No respiratory distress. She has no wheezes. She has no rhonchi. She has no rales. Right breast exhibits no inverted nipple, no mass, no nipple discharge, no skin change and no tenderness. Left breast exhibits no inverted nipple, no mass, no nipple discharge, no skin change and no tenderness. Breasts are symmetrical.  Abdominal:  Soft. Normal appearance, normal aorta and bowel sounds are normal. She exhibits no distension, no pulsatile midline mass and no mass. There is no hepatosplenomegaly or hepatomegaly. There is no tenderness. There is no rigidity, no rebound and no guarding. No hernia.  Musculoskeletal: Normal range of motion. She exhibits edema.  Lymphadenopathy:       Head (right side): No posterior auricular adenopathy present.       Head (left side): No posterior auricular adenopathy present.    She has no cervical adenopathy.       Right: No supraclavicular adenopathy present.       Left: No supraclavicular adenopathy present.  Neurological: She is alert and oriented to person, place, and time. She has normal strength. No cranial nerve deficit. Gait normal.  Skin: Skin is warm, dry and intact. No rash noted. Nails show no clubbing.  Psychiatric: She has a normal mood and affect. Her speech is normal and behavior is normal. Thought content normal. Cognition and memory are normal.    Labs reviewed:  Basic Metabolic Panel:  Recent Labs  05/12/15 1609 10/20/15 1124  NA 140 143  K 3.4* 3.9  CL 100 102  CO2 25 27  GLUCOSE 94 80  BUN 7 7  CREATININE 0.62 0.90  CALCIUM 9.6 9.9   Liver Function Tests:  Recent Labs  05/12/15 1609  AST 18  ALT 16  ALKPHOS 109  BILITOT 0.4  PROT 7.0  ALBUMIN 4.2   No results for input(s): LIPASE, AMYLASE in the last 8760 hours. No results for input(s): AMMONIA in the last 8760 hours. CBC:  Recent Labs  05/12/15 1609  WBC 7.3  NEUTROABS 3.6  HCT 40.2  MCV 82  PLT 300  Lipid Panel: No results for input(s): CHOL, HDL, LDLCALC, TRIG, CHOLHDL, LDLDIRECT in the last 8760 hours. No results found for: HGBA1C  Procedures: No results found. ECG OBTAINED AND REVIEWED BY MYSELF:  NSR @ 60 bpm, nml axis, LAE. No acute ischemic changes. No change since 09/2013  Assessment/Plan   ICD-9-CM ICD-10-CM   1. Well adult exam V70.0 Z00.00 Lipid Panel     TSH  2.  Essential hypertension 401.9 I10 EKG 12-Lead     bisoprolol (ZEBETA) 5 MG tablet     hydrALAZINE (APRESOLINE) 25 MG tablet     CMP     ECHO COMPLETE (BUBBLE STUDY)  3. Aortic regurgitation 424.1 I35.1 ECHO COMPLETE (BUBBLE STUDY)  4. Asthma, chronic, moderate persistent, with status asthmaticus 493.91 J45.42 albuterol (PROVENTIL HFA;VENTOLIN HFA) 108 (90 Base) MCG/ACT inhaler     montelukast (SINGULAIR) 10 MG tablet  5. Allergic rhinitis, unspecified allergic rhinitis type 477.9 J30.9 montelukast (SINGULAIR) 10 MG tablet  6. Generalized anxiety disorder 300.02 F41.1 citalopram (CELEXA) 20 MG tablet     CMP  7. Dental caries 521.00 K02.9   8. Breast cancer screening V76.10 Z12.39 MM Digital Screening   Continue current medications as ordered  FOLLOW UP WITH DR BATES  Recommend annual eye exam due to reduced vision. She declines today  Will call with heart [place] appt and mammogram  Follow up in 3 mos for routine visit   Pt is UTD on health maintenance. Vaccinations are UTD. Pt maintains a healthy lifestyle. Encouraged pt to exercise 30-45 minutes 4-5 times per week. Eat a well balanced diet. Avoid smoking. Limit alcohol intake. Wear seatbelt when riding in the car. Wear sun block (SPF >50) when spending extended times outside.   Avery Eustice S. Perlie Gold  [facility] [address] [place], [place] [address] [phone] Cell (Monday-Friday 8 AM - 5 PM) [phone] After 5 PM and follow prompts

## 2016-02-07 NOTE — Patient Instructions (Addendum)
Encouraged her to exercise 30-45 minutes 4-5 times per week. Eat a well balanced diet. Avoid smoking. Limit alcohol intake. Wear seatbelt when riding in the car. Wear sun block (SPF >50) when spending extended times outside.  Continue current medications as ordered  FOLLOW UP WITH DR BATES  Will call with heart [place] appt and mammogram  Follow up in 3 mos for routine visit

## 2016-02-08 LAB — COMPREHENSIVE METABOLIC PANEL
A/G RATIO: 1.4 (ref 1.2–2.2)
ALBUMIN: 4.4 g/dL (ref 3.5–5.5)
ALT: 17 IU/L (ref 0–32)
AST: 24 IU/L (ref 0–40)
Alkaline Phosphatase: 111 IU/L (ref 39–117)
BILIRUBIN TOTAL: 0.5 mg/dL (ref 0.0–1.2)
BUN / CREAT RATIO: 7 — AB (ref 9–23)
BUN: 6 mg/dL (ref 6–24)
CHLORIDE: 103 mmol/L (ref 96–106)
CO2: 26 mmol/L (ref 18–29)
Calcium: 9.8 mg/dL (ref 8.7–10.2)
Creatinine, Ser: 0.92 mg/dL (ref 0.57–1.00)
GFR calc non Af Amer: 69 mL/min/{1.73_m2} (ref >59)
GFR, EST AFRICAN AMERICAN: 80 mL/min/{1.73_m2} (ref >59)
GLOBULIN, TOTAL: 3.1 g/dL (ref 1.5–4.5)
Glucose: 81 mg/dL (ref 65–99)
POTASSIUM: 3.6 mmol/L (ref 3.5–5.2)
SODIUM: 143 mmol/L (ref 134–144)
TOTAL PROTEIN: 7.5 g/dL (ref 6.0–8.5)

## 2016-02-08 LAB — TSH: TSH: 1.25 u[IU]/mL (ref 0.450–4.500)

## 2016-02-08 LAB — LIPID PANEL
Chol/HDL Ratio: 3.3 ratio units (ref 0.0–4.4)
Cholesterol, Total: 144 mg/dL (ref 100–199)
HDL: 44 mg/dL (ref >39)
LDL Calculated: 73 mg/dL (ref 0–99)
Triglycerides: 135 mg/dL (ref 0–149)
VLDL CHOLESTEROL CAL: 27 mg/dL (ref 5–40)

## 2016-02-26 ENCOUNTER — Ambulatory Visit (HOSPITAL_COMMUNITY): Payer: Medicare Other

## 2016-02-27 ENCOUNTER — Telehealth (HOSPITAL_COMMUNITY): Payer: Self-pay | Admitting: Internal Medicine

## 2016-02-27 NOTE — Telephone Encounter (Signed)
Called pt yesterday and today and lmsg for her to CB to reschedule her Bubble Echo because we do not do those on Fridays.

## 2016-03-08 ENCOUNTER — Other Ambulatory Visit (HOSPITAL_COMMUNITY): Payer: Medicare Other

## 2016-03-10 ENCOUNTER — Other Ambulatory Visit: Payer: Self-pay | Admitting: Internal Medicine

## 2016-03-12 ENCOUNTER — Other Ambulatory Visit: Payer: Self-pay | Admitting: Internal Medicine

## 2016-03-12 ENCOUNTER — Ambulatory Visit (HOSPITAL_COMMUNITY): Payer: Medicare Other | Attending: Cardiology

## 2016-03-12 ENCOUNTER — Encounter (INDEPENDENT_AMBULATORY_CARE_PROVIDER_SITE_OTHER): Payer: Self-pay

## 2016-03-12 ENCOUNTER — Other Ambulatory Visit: Payer: Self-pay

## 2016-03-12 DIAGNOSIS — I340 Nonrheumatic mitral (valve) insufficiency: Secondary | ICD-10-CM | POA: Diagnosis not present

## 2016-03-12 DIAGNOSIS — I10 Essential (primary) hypertension: Secondary | ICD-10-CM | POA: Diagnosis not present

## 2016-03-12 DIAGNOSIS — I119 Hypertensive heart disease without heart failure: Secondary | ICD-10-CM | POA: Insufficient documentation

## 2016-03-12 DIAGNOSIS — I071 Rheumatic tricuspid insufficiency: Secondary | ICD-10-CM | POA: Diagnosis not present

## 2016-03-12 DIAGNOSIS — I359 Nonrheumatic aortic valve disorder, unspecified: Secondary | ICD-10-CM | POA: Diagnosis present

## 2016-03-12 DIAGNOSIS — I351 Nonrheumatic aortic (valve) insufficiency: Secondary | ICD-10-CM | POA: Diagnosis not present

## 2016-04-09 ENCOUNTER — Other Ambulatory Visit: Payer: Self-pay | Admitting: Internal Medicine

## 2016-05-10 ENCOUNTER — Ambulatory Visit: Payer: Medicare Other | Admitting: Internal Medicine

## 2016-05-15 ENCOUNTER — Encounter: Payer: Self-pay | Admitting: Internal Medicine

## 2016-05-28 ENCOUNTER — Other Ambulatory Visit: Payer: Self-pay | Admitting: Internal Medicine

## 2016-06-21 ENCOUNTER — Ambulatory Visit (INDEPENDENT_AMBULATORY_CARE_PROVIDER_SITE_OTHER): Payer: Medicare Other | Admitting: Internal Medicine

## 2016-06-21 ENCOUNTER — Encounter: Payer: Self-pay | Admitting: Internal Medicine

## 2016-06-21 VITALS — BP 126/78 | HR 90 | Temp 99.1°F | Ht <= 58 in | Wt 148.8 lb

## 2016-06-21 DIAGNOSIS — F411 Generalized anxiety disorder: Secondary | ICD-10-CM | POA: Diagnosis not present

## 2016-06-21 DIAGNOSIS — I351 Nonrheumatic aortic (valve) insufficiency: Secondary | ICD-10-CM

## 2016-06-21 DIAGNOSIS — J4542 Moderate persistent asthma with status asthmaticus: Secondary | ICD-10-CM

## 2016-06-21 DIAGNOSIS — Z23 Encounter for immunization: Secondary | ICD-10-CM

## 2016-06-21 DIAGNOSIS — I10 Essential (primary) hypertension: Secondary | ICD-10-CM | POA: Diagnosis not present

## 2016-06-21 DIAGNOSIS — J302 Other seasonal allergic rhinitis: Secondary | ICD-10-CM

## 2016-06-21 LAB — BASIC METABOLIC PANEL WITH GFR
BUN: 9 mg/dL (ref 7–25)
CALCIUM: 9.6 mg/dL (ref 8.6–10.4)
CHLORIDE: 106 mmol/L (ref 98–110)
CO2: 28 mmol/L (ref 20–31)
Creat: 0.87 mg/dL (ref 0.50–1.05)
GFR, EST NON AFRICAN AMERICAN: 74 mL/min (ref >=60)
GFR, Est African American: 86 mL/min (ref >=60)
GLUCOSE: 103 mg/dL — AB (ref 65–99)
POTASSIUM: 3.4 mmol/L — AB (ref 3.5–5.3)
SODIUM: 142 mmol/L (ref 135–146)

## 2016-06-21 MED ORDER — MONTELUKAST SODIUM 10 MG PO TABS: 10.0000 mg | ORAL_TABLET | Freq: Every day | ORAL | 6 refills | Status: DC

## 2016-06-21 MED ORDER — AMLODIPINE BESYLATE 10 MG PO TABS: ORAL_TABLET | ORAL | 3 refills | Status: DC

## 2016-06-21 NOTE — Patient Instructions (Addendum)
Continue current medications as ordered  Follow up in 3 mos for routine visit  Flu shot given today  Will call with lab results

## 2016-06-21 NOTE — Progress Notes (Signed)
Patient ID: Tammie [name], female   DOB: [birth year], 55 y.o.   MRN: [id number]    Location:  [facility] Place of Service: OFFICE  Chief Complaint  Patient presents with  . Medical Management of Chronic Issues    3 month routine visit    HPI:  55 yo female seen today for f/u. She reports nasal congestion and post nasal drip.  Anxiety - stable mood on celexa. No panic attacks.   Asthma /allergic rhinitis- she has used HFA at least 4 times in the last week, including this AM. She uses albuterol HFA BID and takes singulair and zyrtec. She uses flonase daily  HTN - BP controlled on amlodipine and bisoprolol  Nasal mass vs polyps - she is s/p removal in May 2017 by Dr Redmond Baseman, ENT. She is now able to smell. No more rhinorrhea. She has not followed up post op yet  Heart murmur - 2D echo in Aug 2017 revealed EF 55-60%;  Grade 1 DD; mild AI, MR, TR ;mild pulm HTN (32 mm Hg)   Past Medical History:  Diagnosis Date  . Anxiety   . Asthma   . Environmental allergies   . GERD (gastroesophageal reflux disease)   . Headache(784.0)   . Hypertension   . Respiratory failure, acute ([place]) 04/19/2014  . Shortness of breath     Past Surgical History:  Procedure Laterality Date  . [place]   [facility]     Patient Care Team: Gildardo Cranker, DO as PCP - General (Internal Medicine)  Social History   Social History  . Marital status: Single    Spouse name: N/A  . Number of children: N/A  . Years of education: N/A   Occupational History  . Not on file.   Social History Main Topics  . Smoking status: Never Smoker  . Smokeless tobacco: Never Used  . Alcohol use No  . Drug use: No  . Sexual activity: No   Other Topics Concern  . Not on file   Social History Narrative   Diet-Salt Free   Drinks [employer]    Lives in an apartment, one stories, one person in home, no pets   Current/past profession: Dietary, dish-room, and cooks    Exercises 2 x weekly                reports that she has never smoked. She has never used smokeless tobacco. She reports that she does not drink alcohol or use drugs.  Family History  Problem Relation Age of Onset  . Heart attack Father   . Heart attack Mother   . Diabetes Sister   . High blood pressure Brother   . Hypertension Brother   . High blood pressure Sister   . Cancer Sister   . High blood pressure Son   . Cancer Maternal Grandmother   . Heart disease Paternal Grandmother   . Asthma Other     Great paternal grandfather   . Autism Son   . Asthma Other     Neice    Family Status  Relation Status  . Father Deceased at age 84   heart disease  . Mother Deceased at age 57   heart disease, dementia  . Sister Alive  . Brother Alive  . Sister Alive  . Son   . Maternal Grandmother   . Paternal Grandmother   . Other   . Son   . Other      Allergies  Allergen Reactions  .  Ace Inhibitors Other (See Comments)    Angioedema  . Aspirin Anaphylaxis, Swelling and Other (See Comments)    Throat swelling   . Ibuprofen Anaphylaxis, Swelling and Other (See Comments)    Throat swelling  . Nsaids Anaphylaxis, Swelling and Other (See Comments)  . Lipitor [Atorvastatin] Swelling and Other (See Comments)    Face, feet swell    Medications: Patient's Medications  New Prescriptions   No medications on file  Previous Medications   ALBUTEROL (PROVENTIL HFA;VENTOLIN HFA) 108 (90 BASE) MCG/ACT INHALER    Inhale 2 puffs into the lungs every 4 (four) hours as needed for wheezing (bronchitis).   AMLODIPINE (NORVASC) 10 MG TABLET    TAKE 1 TABLET(10 MG) BY MOUTH DAILY FOR HIGH BLOOD PRESSURE   BISOPROLOL (ZEBETA) 5 MG TABLET    TAKE 1/2 TABLET BY MOUTH EVERY DAY FOR HIGH BLOOD PRESSURE   CETIRIZINE (ZYRTEC) 10 MG TABLET    TAKE 1 TABLET BY MOUTH EVERY DAY   CITALOPRAM (CELEXA) 20 MG TABLET    Take 1 tablet (20 mg total) by mouth daily.   FLUTICASONE (FLONASE) 50 MCG/ACT NASAL SPRAY    Place 2 sprays  into both nostrils daily. For allergic rhinitis   HYDRALAZINE (APRESOLINE) 25 MG TABLET    Take 1 tablet (25 mg total) by mouth every 8 (eight) hours.   MONTELUKAST (SINGULAIR) 10 MG TABLET    Take 1 tablet (10 mg total) by mouth at bedtime.   MONTELUKAST (SINGULAIR) 10 MG TABLET    TAKE 1 TABLET BY MOUTH AT BEDTIME   VENTOLIN HFA 108 (90 BASE) MCG/ACT INHALER    INHALE 2 PUFFS INTO THE LUNGS EVERY 4 HOURS AS NEEDED FOR WHEEZING OR BRONCHITIS  Modified Medications   No medications on file  Discontinued Medications   No medications on file    Review of Systems  Unable to perform ROS: Other    Vitals:   06/21/16 1335  BP: 126/78  Pulse: 90  Temp: 99.1 F (37.3 C)  TempSrc: Oral  SpO2: 97%  Weight: 148 lb 12.8 oz (67.5 kg)  Height: '4\' 9"'  (1.448 m)   Body mass index is 32.2 kg/m.  Physical Exam  Constitutional: She is oriented to person, place, and time. She appears well-developed and well-nourished.  HENT:  Mouth/Throat: Oropharynx is clear and moist. No oropharyngeal exudate.  B/l nasal polyps protruding, grey mucosa. No bleeding  Eyes: Pupils are equal, round, and reactive to light. No scleral icterus.  Neck: Neck supple. Carotid bruit is not present. No tracheal deviation present.  Cardiovascular: Normal rate, regular rhythm and intact distal pulses.  Exam reveals no gallop and no friction rub.   Murmur (1/6 diastolic blowing murmur) heard. No LE edema b/l. no calf TTP.   Pulmonary/Chest: Effort normal and breath sounds normal. No stridor. No respiratory distress. She has no wheezes. She has no rales.  Abdominal: Soft. Bowel sounds are normal. She exhibits no distension and no mass. There is no hepatomegaly. There is no tenderness. There is no rebound and no guarding.  obese  Musculoskeletal: She exhibits edema.  Lymphadenopathy:    She has no cervical adenopathy.  Neurological: She is alert and oriented to person, place, and time. She has normal reflexes.  Skin: Skin is  warm and dry. No rash noted.  Psychiatric: She has a normal mood and affect. Her behavior is normal. Judgment and thought content normal.     Labs reviewed: No visits with results within 3 Month(s) from this  visit.  Latest known visit with results is:  Office Visit on 02/07/2016  Component Date Value Ref Range Status  . Glucose 02/08/2016 81  65 - 99 mg/dL Final  . BUN 02/08/2016 6  6 - 24 mg/dL Final  . Creatinine, Ser 02/08/2016 0.92  0.57 - 1.00 mg/dL Final  . GFR calc non Af Amer 02/08/2016 69  >59 mL/min/1.73 Final  . GFR calc Af Amer 02/08/2016 80  >59 mL/min/1.73 Final  . BUN/Creatinine Ratio 02/08/2016 7* 9 - 23 Final  . Sodium 02/08/2016 143  134 - 144 mmol/L Final  . Potassium 02/08/2016 3.6  3.5 - 5.2 mmol/L Final  . Chloride 02/08/2016 103  96 - 106 mmol/L Final  . CO2 02/08/2016 26  18 - 29 mmol/L Final  . Calcium 02/08/2016 9.8  8.7 - 10.2 mg/dL Final  . Total Protein 02/08/2016 7.5  6.0 - 8.5 g/dL Final  . Albumin 02/08/2016 4.4  3.5 - 5.5 g/dL Final  . Globulin, Total 02/08/2016 3.1  1.5 - 4.5 g/dL Final  . Albumin/Globulin Ratio 02/08/2016 1.4  1.2 - 2.2 Final  . Bilirubin Total 02/08/2016 0.5  0.0 - 1.2 mg/dL Final  . Alkaline Phosphatase 02/08/2016 111  39 - 117 IU/L Final  . AST 02/08/2016 24  0 - 40 IU/L Final  . ALT 02/08/2016 17  0 - 32 IU/L Final  . Cholesterol, Total 02/08/2016 144  100 - 199 mg/dL Final  . Triglycerides 02/08/2016 135  0 - 149 mg/dL Final  . HDL 02/08/2016 44  >39 mg/dL Final  . VLDL Cholesterol Cal 02/08/2016 27  5 - 40 mg/dL Final  . LDL Calculated 02/08/2016 73  0 - 99 mg/dL Final  . Chol/HDL Ratio 02/08/2016 3.3  0.0 - 4.4 ratio units Final   Comment:                                   T. Chol/HDL Ratio                                             Men  Women                               1/2 Avg.Risk  3.4    3.3                                   Avg.Risk  5.0    4.4                                2X Avg.Risk  9.6    7.1                                 3X Avg.Risk 23.4   11.0   . TSH 02/08/2016 1.250  0.450 - 4.500 uIU/mL Final    No results found.   Assessment/Plan   ICD-9-CM ICD-10-CM   1. Asthma, chronic, moderate persistent, with status asthmaticus 493.91 J45.42 montelukast (SINGULAIR) 10 MG tablet  2. Chronic seasonal allergic rhinitis, unspecified trigger 477.8  J30.2   3. Aortic valve insufficiency, etiology of cardiac valve disease unspecified 424.1 I35.1   4. Essential hypertension 401.9 I10 amLODipine (NORVASC) 10 MG tablet     BMP with eGFR  5. Generalized anxiety disorder 300.02 F41.1   6. Encounter for immunization Z23 Z23 Flu Vaccine QUAD 36+ mos IM    Continue current medications as ordered  Follow up in 3 mos for routine visit  Flu shot given today  Will call with lab results  Lloyd Cullinan S. Perlie Gold  [facility] and [facility] [address] [place], [place] [address] [phone] Cell (Monday-Friday 8 AM - 5 PM) [phone] After 5 PM and follow prompts

## 2016-07-11 ENCOUNTER — Other Ambulatory Visit: Payer: Self-pay | Admitting: Internal Medicine

## 2016-07-11 DIAGNOSIS — I10 Essential (primary) hypertension: Secondary | ICD-10-CM

## 2016-08-15 ENCOUNTER — Other Ambulatory Visit: Payer: Self-pay | Admitting: Internal Medicine

## 2016-08-15 DIAGNOSIS — F411 Generalized anxiety disorder: Secondary | ICD-10-CM

## 2016-08-16 ENCOUNTER — Encounter (HOSPITAL_COMMUNITY): Payer: Self-pay

## 2016-08-16 ENCOUNTER — Ambulatory Visit (HOSPITAL_COMMUNITY)
Admission: EM | Admit: 2016-08-16 | Discharge: 2016-08-16 | Disposition: A | Discharge status: Home / Self Care | Payer: Medicare HMO | Attending: Family Medicine | Admitting: Family Medicine

## 2016-08-16 ENCOUNTER — Telehealth: Payer: Self-pay

## 2016-08-16 DIAGNOSIS — R0602 Shortness of breath: Secondary | ICD-10-CM

## 2016-08-16 DIAGNOSIS — J4541 Moderate persistent asthma with (acute) exacerbation: Secondary | ICD-10-CM | POA: Diagnosis not present

## 2016-08-16 DIAGNOSIS — B9789 Other viral agents as the cause of diseases classified elsewhere: Secondary | ICD-10-CM | POA: Diagnosis not present

## 2016-08-16 DIAGNOSIS — J069 Acute upper respiratory infection, unspecified: Secondary | ICD-10-CM | POA: Diagnosis not present

## 2016-08-16 MED ORDER — PREDNISONE 20 MG PO TABS: ORAL_TABLET | ORAL | 0 refills | Status: DC

## 2016-08-16 MED ORDER — IPRATROPIUM-ALBUTEROL 0.5-2.5 (3) MG/3ML IN SOLN
RESPIRATORY_TRACT | Status: AC
  Filled 2016-08-16: qty 3

## 2016-08-16 MED ORDER — ALBUTEROL SULFATE (2.5 MG/3ML) 0.083% IN NEBU: 2.5000 mg | INHALATION_SOLUTION | Freq: Four times a day (QID) | RESPIRATORY_TRACT | 0 refills | Status: DC | PRN

## 2016-08-16 MED ORDER — ALBUTEROL SULFATE (2.5 MG/3ML) 0.083% IN NEBU
INHALATION_SOLUTION | RESPIRATORY_TRACT | Status: AC
  Filled 2016-08-16: qty 3

## 2016-08-16 MED ORDER — DEXAMETHASONE SODIUM PHOSPHATE 10 MG/ML IJ SOLN
INTRAMUSCULAR | Status: AC
  Filled 2016-08-16: qty 1

## 2016-08-16 MED ORDER — METHYLPREDNISOLONE SODIUM SUCC 125 MG IJ SOLR
80.0000 mg | Freq: Once | INTRAMUSCULAR | Status: AC
  Administered 2016-08-16: 80 mg via INTRAMUSCULAR

## 2016-08-16 MED ORDER — DEXAMETHASONE SODIUM PHOSPHATE 10 MG/ML IJ SOLN
10.0000 mg | Freq: Once | INTRAMUSCULAR | Status: AC
  Administered 2016-08-16: 10 mg via INTRAMUSCULAR

## 2016-08-16 MED ORDER — IPRATROPIUM-ALBUTEROL 0.5-2.5 (3) MG/3ML IN SOLN
3.0000 mL | Freq: Once | RESPIRATORY_TRACT | Status: AC
  Administered 2016-08-16: 3 mL via RESPIRATORY_TRACT

## 2016-08-16 MED ORDER — ALBUTEROL SULFATE (2.5 MG/3ML) 0.083% IN NEBU
2.5000 mg | INHALATION_SOLUTION | Freq: Once | RESPIRATORY_TRACT | Status: AC
  Administered 2016-08-16: 2.5 mg via RESPIRATORY_TRACT

## 2016-08-16 MED ORDER — METHYLPREDNISOLONE SODIUM SUCC 125 MG IJ SOLR
INTRAMUSCULAR | Status: AC
  Filled 2016-08-16: qty 2

## 2016-08-16 NOTE — Discharge Instructions (Signed)
Use your albuterol nebulizer every 4-6 hours as needed for breathing. Take the prednisone as directed starting tomorrow. Take with food. If you continue to have problems with breathing despite using the nebulizer  or you develop fever or increased shortness of breath call 911 or go to the emergency department. The following medication may also help with your upper respiratory symptoms. Allegra or Zyrtec daily as needed for drainage and runny nose. For stronger antihistamine may take Chlor-Trimeton 2 to 4 mg every 4 to 6 hours, may cause drowsiness. Saline nasal spray used frequently. Ibuprofen 400 mg every 8 hours as needed for pain, discomfort or fever. Drink plenty of fluids and stay well-hydrated. Flonase or Rhinocort nasal spray daily

## 2016-08-16 NOTE — ED Triage Notes (Signed)
SOB for 1 week, chest congestion, Neck pain and has a hx of asthma. Has been using her inhaler without relief. Pro air.

## 2016-08-16 NOTE — Telephone Encounter (Signed)
Patient and her sister called (speaker phone), patient with trouble breathing, chest tightness, really bad productive cough(yellowish phlegm)  and congestion. Patient denies fever.   Patient would like to be seen today. I informed patient to go to Urgent Care, no available appointments.  Message forwarded to Dr.Carter as a Juluis Rainier

## 2016-08-16 NOTE — ED Provider Notes (Signed)
CSN: [id number]     Arrival date & time 08/16/16  1107 History   First MD Initiated Contact with Patient 08/16/16 1130     Chief Complaint  Patient presents with  . Shortness of Breath   (Consider location/radiation/quality/duration/timing/severity/associated sxs/prior Treatment) 56 year old female presents to the urgent care with trouble breathing and DOE. She is coughing up mucus. She has a history of asthma and she is using her albuterol inhaler 4 times a day without relief. Symptoms started 4 days ago. This is her first visit for medical assistance. His also complaining of upper respiratory congestion. Currently afebrile. O2 sat 95%. Medical history includes asthma, anxiety, GERD, hypertension, history of respiratory failure and episodic shortness of breath.        Past Medical History:  Diagnosis Date  . Anxiety   . Asthma   . Environmental allergies   . GERD (gastroesophageal reflux disease)   . Headache(784.0)   . Hypertension   . Respiratory failure, acute ([place]) 04/19/2014  . Shortness of breath    Past Surgical History:  Procedure Laterality Date  . ABDOMINAL HYSTERECTOMY  1993   [facility]    Family History  Problem Relation Age of Onset  . Heart attack Father   . Heart attack Mother   . Diabetes Sister   . High blood pressure Brother   . Hypertension Brother   . High blood pressure Sister   . Cancer Sister   . High blood pressure Son   . Cancer Maternal Grandmother   . Heart disease Paternal Grandmother   . Asthma Other     Great paternal grandfather   . Autism Son   . Asthma Other     Neice    Social History  Substance Use Topics  . Smoking status: Never Smoker  . Smokeless tobacco: Never Used  . Alcohol use No   OB History    No data available     Review of Systems  Constitutional: Positive for activity change. Negative for appetite change, chills, fatigue and fever.  HENT: Positive for congestion, postnasal drip and rhinorrhea.  Negative for facial swelling and sore throat.   Eyes: Negative.   Respiratory: Positive for cough, shortness of breath and wheezing.   Cardiovascular: Negative.  Negative for chest pain.  Gastrointestinal: Negative.   Musculoskeletal: Negative for neck pain and neck stiffness.  Skin: Negative for pallor and rash.  Neurological: Negative.   All other systems reviewed and are negative.   Allergies  Ace inhibitors; Aspirin; Ibuprofen; Nsaids; and Lipitor [atorvastatin]  Home Medications   Prior to Admission medications   Medication Sig Start Date End Date Taking? Authorizing Provider  amLODipine (NORVASC) 10 MG tablet TAKE 1 TABLET(10 MG) BY MOUTH DAILY FOR HIGH BLOOD PRESSURE 06/21/16  Yes Gildardo Cranker, DO  citalopram (CELEXA) 20 MG tablet Take 1 tablet (20 mg total) by mouth daily. 02/07/16  Yes Gildardo Cranker, DO  hydrALAZINE (APRESOLINE) 25 MG tablet TAKE 1 TABLET(25 MG) BY MOUTH EVERY 8 HOURS 07/11/16  Yes Tiffany L Reed, DO  montelukast (SINGULAIR) 10 MG tablet Take 1 tablet (10 mg total) by mouth at bedtime. 06/21/16  Yes Monica Carter, DO  albuterol (PROVENTIL) (2.5 MG/3ML) 0.083% nebulizer solution Take 3 mLs (2.5 mg total) by nebulization every 6 (six) hours as needed for wheezing or shortness of breath. 08/16/16   Janne Napoleon, NP  bisoprolol (ZEBETA) 5 MG tablet TAKE 1/2 TABLET BY MOUTH EVERY DAY FOR HIGH BLOOD PRESSURE 02/07/16   Gildardo Cranker, DO  cetirizine (ZYRTEC) 10 MG tablet TAKE 1 TABLET BY MOUTH EVERY DAY 04/10/16   Gildardo Cranker, DO  citalopram (CELEXA) 20 MG tablet TAKE 1 TABLET(20 MG) BY MOUTH DAILY 08/16/16   Gildardo Cranker, DO  fluticasone (FLONASE) 50 MCG/ACT nasal spray Place 2 sprays into both nostrils daily. For allergic rhinitis 11/16/14   Gildardo Cranker, DO  predniSONE (DELTASONE) 20 MG tablet Take 3 tabs po on first day, 2 tabs second day, 2 tabs third day, 1 tab fourth day, 1 tab 5th day. Take with food. Start 08/17/2016. 08/16/16   Janne Napoleon, NP   Meds Ordered and  Administered this Visit   Medications  ipratropium-albuterol (DUONEB) 0.5-2.5 (3) MG/3ML nebulizer solution 3 mL (3 mLs Nebulization Given 08/16/16 1159)  albuterol (PROVENTIL) (2.5 MG/3ML) 0.083% nebulizer solution 2.5 mg (2.5 mg Nebulization Given 08/16/16 1159)  dexamethasone (DECADRON) injection 10 mg (10 mg Intramuscular Given 08/16/16 1154)  methylPREDNISolone sodium succinate (SOLU-MEDROL) 125 mg/2 mL injection 80 mg (80 mg Intramuscular Given 08/16/16 1157)  albuterol (PROVENTIL) (2.5 MG/3ML) 0.083% nebulizer solution 2.5 mg (2.5 mg Nebulization Given 08/16/16 1237)    BP 153/81 (BP Location: Left Arm)   Pulse 90   Temp 98.3 F (36.8 C) (Oral)   Resp 16   SpO2 95%  No data found.   Physical Exam  Constitutional: She is oriented to person, place, and time. She appears well-developed and well-nourished. No distress.  HENT:  Mouth/Throat: No oropharyngeal exudate.  Bilateral TMs are occluded by cerumen. Oropharynx with minor erythema, cobblestoning and clear PND.  Neck: Normal range of motion. Neck supple.  Cardiovascular: Normal rate and regular rhythm.   Pulmonary/Chest: No respiratory distress. She has wheezes.  Increase in respiratory effort and rate. Poor air movement. Bilateral coarseness and wheezing.  Musculoskeletal: Normal range of motion. She exhibits no edema.  Lymphadenopathy:    She has no cervical adenopathy.  Neurological: She is alert and oriented to person, place, and time.  Skin: Skin is warm and dry. No rash noted.  Psychiatric: She has a normal mood and affect.  Nursing note and vitals reviewed.   Urgent Care Course   Clinical Course     Procedures (including critical care time)  Labs Review Labs Reviewed - No data to display  Imaging Review No results found.   Visual Acuity Review  Right Eye Distance:   Left Eye Distance:   Bilateral Distance:    Right Eye Near:   Left Eye Near:    Bilateral Near:         MDM   1. Moderate  persistent asthma with exacerbation   2. Shortness of breath   3. Viral upper respiratory tract infection    After the initial DuoNeb the patient states that she is breathing a little better. She does have improved air movement and decrease in wheeze however she continues to have some wheezing and mildly increased effort. Repeat albuterol neb 2.5 mg. after the second albuterol nebulizer treatment the patient states she is feeling much better and breathing much better. She is moving more air, only if few distant wheezes. She has improved substantially. Patient states she wants to go home now and take her medicines as needed. Use your albuterol nebulizer every 4-6 hours as needed for breathing. Take the prednisone as directed starting tomorrow. Take with food. If you continue to have problems with breathing despite using the nebulizer  or you develop fever or increased shortness of breath call 911 or go to the emergency department.  The following medication may also help with your upper respiratory symptoms. Allegra or Zyrtec daily as needed for drainage and runny nose. For stronger antihistamine may take Chlor-Trimeton 2 to 4 mg every 4 to 6 hours, may cause drowsiness. Saline nasal spray used frequently. Ibuprofen 400 mg every 8 hours as needed for pain, discomfort or fever. Drink plenty of fluids and stay well-hydrated. Flonase or Rhinocort nasal spray daily Meds ordered this encounter  Medications  . ipratropium-albuterol (DUONEB) 0.5-2.5 (3) MG/3ML nebulizer solution 3 mL  . albuterol (PROVENTIL) (2.5 MG/3ML) 0.083% nebulizer solution 2.5 mg  . dexamethasone (DECADRON) injection 10 mg  . methylPREDNISolone sodium succinate (SOLU-MEDROL) 125 mg/2 mL injection 80 mg  . albuterol (PROVENTIL) (2.5 MG/3ML) 0.083% nebulizer solution 2.5 mg  . albuterol (PROVENTIL) (2.5 MG/3ML) 0.083% nebulizer solution    Sig: Take 3 mLs (2.5 mg total) by nebulization every 6 (six) hours as needed for wheezing or  shortness of breath.    Dispense:  75 mL    Refill:  0    Order Specific Question:   Supervising Provider    Answer:   Billy Fischer [phone]  . predniSONE (DELTASONE) 20 MG tablet    Sig: Take 3 tabs po on first day, 2 tabs second day, 2 tabs third day, 1 tab fourth day, 1 tab 5th day. Take with food. Start 08/17/2016.    Dispense:  9 tablet    Refill:  0    Order Specific Question:   Supervising Provider    Answer:   Billy Fischer [[id number]]       Janne Napoleon, NP 08/16/16 1251

## 2016-09-18 ENCOUNTER — Telehealth: Payer: Self-pay

## 2016-09-18 DIAGNOSIS — Z1231 Encounter for screening mammogram for malignant neoplasm of breast: Secondary | ICD-10-CM

## 2016-09-18 NOTE — Telephone Encounter (Signed)
Called Tammie [name] to try schedule an appointment for a  mammogram. Left a message for her to please call back.

## 2016-09-25 ENCOUNTER — Ambulatory Visit (INDEPENDENT_AMBULATORY_CARE_PROVIDER_SITE_OTHER): Payer: Medicare HMO | Admitting: Internal Medicine

## 2016-09-25 ENCOUNTER — Encounter: Payer: Self-pay | Admitting: Internal Medicine

## 2016-09-25 VITALS — BP 124/72 | HR 71 | Temp 97.9°F | Ht <= 58 in | Wt 144.8 lb

## 2016-09-25 DIAGNOSIS — I10 Essential (primary) hypertension: Secondary | ICD-10-CM | POA: Diagnosis not present

## 2016-09-25 DIAGNOSIS — J302 Other seasonal allergic rhinitis: Secondary | ICD-10-CM | POA: Diagnosis not present

## 2016-09-25 DIAGNOSIS — Z1211 Encounter for screening for malignant neoplasm of colon: Secondary | ICD-10-CM | POA: Diagnosis not present

## 2016-09-25 DIAGNOSIS — J3089 Other allergic rhinitis: Secondary | ICD-10-CM | POA: Insufficient documentation

## 2016-09-25 DIAGNOSIS — F411 Generalized anxiety disorder: Secondary | ICD-10-CM

## 2016-09-25 DIAGNOSIS — I351 Nonrheumatic aortic (valve) insufficiency: Secondary | ICD-10-CM | POA: Insufficient documentation

## 2016-09-25 DIAGNOSIS — J4540 Moderate persistent asthma, uncomplicated: Secondary | ICD-10-CM | POA: Diagnosis not present

## 2016-09-25 MED ORDER — CITALOPRAM HYDROBROMIDE 20 MG PO TABS
ORAL_TABLET | ORAL | 0 refills | Status: DC
Start: 2016-09-25 — End: 2017-02-12

## 2016-09-25 MED ORDER — TETANUS-DIPHTH-ACELL PERTUSSIS 5-2.5-18.5 LF-MCG/0.5 IM SUSP: 0.5000 mL | Freq: Once | INTRAMUSCULAR | 0 refills | Status: AC

## 2016-09-25 MED ORDER — ALBUTEROL SULFATE HFA 108 (90 BASE) MCG/ACT IN AERS: 2.0000 | INHALATION_SPRAY | Freq: Four times a day (QID) | RESPIRATORY_TRACT | 6 refills | Status: DC | PRN

## 2016-09-25 MED ORDER — AMLODIPINE BESYLATE 10 MG PO TABS: ORAL_TABLET | ORAL | 6 refills | Status: DC

## 2016-09-25 NOTE — Progress Notes (Signed)
Patient ID: Tammie [name], female   DOB: 01-May-1961, 56 y.o.   MRN: [id number]    Location:  [facility] Place of Service: OFFICE  Chief Complaint  Patient presents with  . Medical Management of Chronic Issues    3 month Routine Visit    HPI:  56 yo female seen today for f/u. She was seen in the ED last month asthma exacerbation. She was tx with nebs and IV solumedrol. She was d/c'd from ED with prednisone taper and HFA. She feels better now. Family present  Anxiety - stable mood on celexa. No panic attacks.   Asthma moderate persistent/allergic rhinitis - She uses albuterol HFA daily and takes singulair and zyrtec. She uses flonase daily.   HTN - BP controlled on amlodipine and bisoprolol  Nasal mass vs polyps - she is s/p removal in May 2017 by Dr Redmond Baseman, ENT. She is now able to smell. (+) rhinorrhea. She never followed up postop procedure.  Heart murmur - 2D echo in Aug 2017 revealed EF 55-60%;  Grade 1 DD; mild AI, MR, TR ;mild pulm HTN (32 mm Hg)   Past Medical History:  Diagnosis Date  . Anxiety   . Asthma   . Environmental allergies   . GERD (gastroesophageal reflux disease)   . Headache(784.0)   . Hypertension   . Respiratory failure, acute ([place]) 04/19/2014  . Shortness of breath     Past Surgical History:  Procedure Laterality Date  . [place]   [facility]     Patient Care Team: Gildardo Cranker, DO as PCP - General (Internal Medicine)  Social History   Social History  . Marital status: Single    Spouse name: N/A  . Number of children: N/A  . Years of education: N/A   Occupational History  . Not on file.   Social History Main Topics  . Smoking status: Never Smoker  . Smokeless tobacco: Never Used  . Alcohol use No  . Drug use: No  . Sexual activity: No   Other Topics Concern  . Not on file   Social History Narrative   Diet-Salt Free   Drinks [employer]    Lives in an apartment, one stories, one person in home, no pets   Current/past profession: Dietary, dish-room, and cooks    Exercises 2 x weekly                 reports that she has never smoked. She has never used smokeless tobacco. She reports that she does not drink alcohol or use drugs.  Family History  Problem Relation Age of Onset  . Heart attack Father   . Heart attack Mother   . Diabetes Sister   . High blood pressure Brother   . Hypertension Brother   . High blood pressure Sister   . Cancer Sister   . High blood pressure Son   . Cancer Maternal Grandmother   . Heart disease Paternal Grandmother   . Asthma Other     Great paternal grandfather   . Autism Son   . Asthma Other     Neice    Family Status  Relation Status  . Father Deceased at age 77   heart disease  . Mother Deceased at age 23   heart disease, dementia  . Sister Alive  . Brother Alive  . Sister Alive  . Son   . Maternal Grandmother   . Paternal Grandmother   . Other   . Son   .  Other      Allergies  Allergen Reactions  . Ace Inhibitors Other (See Comments)    Angioedema  . Aspirin Anaphylaxis, Swelling and Other (See Comments)    Throat swelling   . Ibuprofen Anaphylaxis, Swelling and Other (See Comments)    Throat swelling  . Nsaids Anaphylaxis, Swelling and Other (See Comments)  . Lipitor [Atorvastatin] Swelling and Other (See Comments)    Face, feet swell    Medications: Patient's Medications  New Prescriptions   No medications on file  Previous Medications   ALBUTEROL (PROVENTIL) (2.5 MG/3ML) 0.083% NEBULIZER SOLUTION    Take 3 mLs (2.5 mg total) by nebulization every 6 (six) hours as needed for wheezing or shortness of breath.   AMLODIPINE (NORVASC) 10 MG TABLET    TAKE 1 TABLET(10 MG) BY MOUTH DAILY FOR HIGH BLOOD PRESSURE   BISOPROLOL (ZEBETA) 5 MG TABLET    TAKE 1/2 TABLET BY MOUTH EVERY DAY FOR HIGH BLOOD PRESSURE   CETIRIZINE (ZYRTEC) 10 MG TABLET    TAKE 1 TABLET BY MOUTH EVERY DAY   CITALOPRAM (CELEXA) 20 MG TABLET    Take 1  tablet (20 mg total) by mouth daily.   CITALOPRAM (CELEXA) 20 MG TABLET    TAKE 1 TABLET(20 MG) BY MOUTH DAILY   FLUTICASONE (FLONASE) 50 MCG/ACT NASAL SPRAY    Place 2 sprays into both nostrils daily. For allergic rhinitis   HYDRALAZINE (APRESOLINE) 25 MG TABLET    TAKE 1 TABLET(25 MG) BY MOUTH EVERY 8 HOURS   MONTELUKAST (SINGULAIR) 10 MG TABLET    Take 1 tablet (10 mg total) by mouth at bedtime.   PREDNISONE (DELTASONE) 20 MG TABLET    Take 3 tabs po on first day, 2 tabs second day, 2 tabs third day, 1 tab fourth day, 1 tab 5th day. Take with food. Start 08/17/2016.  Modified Medications   No medications on file  Discontinued Medications   No medications on file    Review of Systems  Unable to perform ROS: Other    Vitals:   09/25/16 1012  BP: 124/72  Pulse: 71  Temp: 97.9 F (36.6 C)  TempSrc: Oral  SpO2: 97%  Weight: 144 lb 12.8 oz (65.7 kg)  Height: 4\' 9"  (1.448 m)   Body mass index is 31.33 kg/m.  Physical Exam  Constitutional: She is oriented to person, place, and time. She appears well-developed and well-nourished.  HENT:  Mouth/Throat: Oropharynx is clear and moist. No oropharyngeal exudate.  Eyes: Pupils are equal, round, and reactive to light. No scleral icterus.  Neck: Neck supple. Carotid bruit is not present. No tracheal deviation present.  Cardiovascular: Normal rate, regular rhythm and intact distal pulses.  Exam reveals no gallop and no friction rub.   Murmur (1/6 diastolic blowing murmur) heard. No LE edema b/l. no calf TTP.   Pulmonary/Chest: Effort normal and breath sounds normal. No stridor. No respiratory distress. She has no wheezes. She has no rales.  Abdominal: Soft. Bowel sounds are normal. She exhibits no distension and no mass. There is no hepatomegaly. There is no tenderness. There is no rebound and no guarding.  obese  Musculoskeletal: She exhibits edema.  Lymphadenopathy:    She has no cervical adenopathy.  Neurological: She is alert and  oriented to person, place, and time. She has normal reflexes.  Skin: Skin is warm and dry. No rash noted.  Psychiatric: She has a normal mood and affect. Her behavior is normal. Judgment and thought content normal.  Labs reviewed: No visits with results within 3 Month(s) from this visit.  Latest known visit with results is:  Office Visit on 06/21/2016  Component Date Value Ref Range Status  . Sodium 06/21/2016 142  135 - 146 mmol/L Final  . Potassium 06/21/2016 3.4* 3.5 - 5.3 mmol/L Final  . Chloride 06/21/2016 106  98 - 110 mmol/L Final  . CO2 06/21/2016 28  20 - 31 mmol/L Final  . Glucose, Bld 06/21/2016 103* 65 - 99 mg/dL Final  . BUN 06/21/2016 9  7 - 25 mg/dL Final  . Creat 06/21/2016 0.87  0.50 - 1.05 mg/dL Final   Comment:   For patients > or = 58 years of age: The upper reference limit for Creatinine is approximately 13% higher for people identified as African-American.     . Calcium 06/21/2016 9.6  8.6 - 10.4 mg/dL Final  . GFR, Est African American 06/21/2016 86  >=60 mL/min Final  . GFR, Est Non African American 06/21/2016 74  >=60 mL/min Final    No results found.   Assessment/Plan   ICD-9-CM ICD-10-CM   1. Essential hypertension 401.9 I10 amLODipine (NORVASC) 10 MG tablet  2. Colon cancer screening V76.51 Z12.11 Ambulatory referral to Gastroenterology  3. Generalized anxiety disorder 300.02 F41.1 citalopram (CELEXA) 20 MG tablet  4. Moderate persistent asthma without complication [id number] albuterol (PROVENTIL HFA;VENTOLIN HFA) 108 (90 Base) MCG/ACT inhaler  5. Chronic seasonal allergic rhinitis, unspecified trigger 477.8 J30.2   6. Aortic valve insufficiency, etiology of cardiac valve disease unspecified 424.1 I35.1    Continue current medications as ordered  FOLLOW UP WITH DR BATES FOR SEASONAL ALLERGY, NASAL POLYPS AND ASTHMA  Follow up in 5-6 mos for AWV/CPE/ECG. Fasting labs prior to appt    Boykins S. Perlie Gold  [facility] and Adult Medicine [address] [place], [place] [address] [phone] Cell (Monday-Friday 8 AM - 5 PM) [phone] After 5 PM and follow prompts

## 2016-09-25 NOTE — Patient Instructions (Addendum)
Continue current medications as ordered  FOLLOW UP WITH DR BATES FOR SEASONAL ALLERGY, NASAL POLYPS AND ASTHMA  Follow up in 5-6 mos for AWV/CPE/ECG. Fasting labs prior to appt

## 2016-09-26 NOTE — Telephone Encounter (Signed)
Spoke with patient, patient ok to get mammogram. Order placed

## 2016-09-26 NOTE — Addendum Note (Signed)
Addended by: Logan Bores on: 09/26/2016 10:38 AM   Modules accepted: Orders

## 2016-10-10 ENCOUNTER — Other Ambulatory Visit: Payer: Self-pay | Admitting: Internal Medicine

## 2016-10-10 DIAGNOSIS — I10 Essential (primary) hypertension: Secondary | ICD-10-CM

## 2016-11-06 ENCOUNTER — Encounter: Payer: Self-pay | Admitting: Internal Medicine

## 2016-12-09 ENCOUNTER — Other Ambulatory Visit: Payer: Self-pay | Admitting: Internal Medicine

## 2016-12-09 ENCOUNTER — Telehealth: Payer: Self-pay | Admitting: *Deleted

## 2016-12-09 DIAGNOSIS — I10 Essential (primary) hypertension: Secondary | ICD-10-CM

## 2016-12-09 NOTE — Telephone Encounter (Signed)
Received fax from [facility] for prior authorization for Proventil HFA inhaler Inhale 2 puffs into the lungs every 6 hours as needed for wheezing or SOB. Prior authorization done through Cover My Meds. In PENDING Status. Awaiting determination.  Member ID#: [id number]

## 2016-12-11 ENCOUNTER — Telehealth: Payer: Self-pay

## 2016-12-11 NOTE — Telephone Encounter (Signed)
[employer]  [phone]  Patient has been approved for Proventil 90 MCH inhaler. Good until 08/04/2017.  Member ID: [id number]

## 2016-12-11 NOTE — Telephone Encounter (Signed)
Spoke with pharmacy they stated that they would notify patient when refill is ready.

## 2016-12-16 ENCOUNTER — Encounter: Payer: Self-pay | Admitting: Internal Medicine

## 2016-12-26 ENCOUNTER — Encounter: Payer: Self-pay | Admitting: Internal Medicine

## 2017-01-14 ENCOUNTER — Other Ambulatory Visit: Payer: Self-pay | Admitting: Internal Medicine

## 2017-01-14 DIAGNOSIS — I10 Essential (primary) hypertension: Secondary | ICD-10-CM

## 2017-02-07 ENCOUNTER — Other Ambulatory Visit: Payer: Self-pay | Admitting: Internal Medicine

## 2017-02-07 ENCOUNTER — Other Ambulatory Visit: Payer: Medicare HMO

## 2017-02-07 ENCOUNTER — Ambulatory Visit (INDEPENDENT_AMBULATORY_CARE_PROVIDER_SITE_OTHER): Payer: Medicare HMO

## 2017-02-07 VITALS — BP 122/50 | HR 80 | Temp 97.5°F | Ht <= 58 in | Wt 148.0 lb

## 2017-02-07 DIAGNOSIS — Z79899 Other long term (current) drug therapy: Secondary | ICD-10-CM

## 2017-02-07 DIAGNOSIS — Z723 Lack of physical exercise: Secondary | ICD-10-CM | POA: Diagnosis not present

## 2017-02-07 DIAGNOSIS — Z1231 Encounter for screening mammogram for malignant neoplasm of breast: Secondary | ICD-10-CM

## 2017-02-07 DIAGNOSIS — Z0000 Encounter for general adult medical examination without abnormal findings: Secondary | ICD-10-CM

## 2017-02-07 DIAGNOSIS — I10 Essential (primary) hypertension: Secondary | ICD-10-CM

## 2017-02-07 DIAGNOSIS — Z1239 Encounter for other screening for malignant neoplasm of breast: Secondary | ICD-10-CM

## 2017-02-07 DIAGNOSIS — Z1211 Encounter for screening for malignant neoplasm of colon: Secondary | ICD-10-CM | POA: Diagnosis not present

## 2017-02-07 DIAGNOSIS — Z135 Encounter for screening for eye and ear disorders: Secondary | ICD-10-CM

## 2017-02-07 MED ORDER — TETANUS-DIPHTH-ACELL PERTUSSIS 5-2.5-18.5 LF-MCG/0.5 IM SUSP: 0.5000 mL | Freq: Once | INTRAMUSCULAR | 0 refills | Status: AC

## 2017-02-07 MED ORDER — HYDRALAZINE HCL 25 MG PO TABS: ORAL_TABLET | ORAL | 5 refills | Status: DC

## 2017-02-07 NOTE — Progress Notes (Signed)
Subjective:   Tammie [name] is a 56 y.o. female who presents for an Initial Medicare Annual Wellness Visit.       Objective:    Today's Vitals   02/07/17 0905  BP: (!) 122/50  Pulse: 80  Temp: (!) 97.5 F (36.4 C)  TempSrc: Oral  SpO2: 95%  Weight: 148 lb (67.1 kg)  Height: 4\' 9"  (1.448 m)  PainSc: 6    Body mass index is 32.03 kg/m.   Current Medications (verified) Outpatient Encounter Prescriptions as of 02/07/2017  Medication Sig  . albuterol (PROVENTIL HFA;VENTOLIN HFA) 108 (90 Base) MCG/ACT inhaler Inhale 2 puffs into the lungs every 6 (six) hours as needed for wheezing or shortness of breath.  Marland Kitchen amLODipine (NORVASC) 10 MG tablet TAKE 1 TABLET(10 MG) BY MOUTH DAILY FOR HIGH BLOOD PRESSURE  . bisoprolol (ZEBETA) 5 MG tablet TAKE 1/2 TABLET BY MOUTH EVERY DAY FOR HIGH BLOOD PRESSURE  . cetirizine (ZYRTEC) 10 MG tablet TAKE 1 TABLET BY MOUTH EVERY DAY  . hydrALAZINE (APRESOLINE) 25 MG tablet TAKE 1 TABLET(25 MG) BY MOUTH EVERY 8 HOURS  . montelukast (SINGULAIR) 10 MG tablet Take 1 tablet (10 mg total) by mouth at bedtime.  . Tdap (BOOSTRIX) 5-2.5-18.5 LF-MCG/0.5 injection Inject 0.5 mLs into the muscle once.  . [DISCONTINUED] hydrALAZINE (APRESOLINE) 25 MG tablet TAKE 1 TABLET(25 MG) BY MOUTH EVERY 8 HOURS  . [DISCONTINUED] Tdap (BOOSTRIX) 5-2.5-18.5 LF-MCG/0.5 injection Inject 0.5 mLs into the muscle once.  . citalopram (CELEXA) 20 MG tablet TAKE 1 TABLET(20 MG) BY MOUTH DAILY (Patient not taking: Reported on 02/07/2017)  . [DISCONTINUED] albuterol (PROVENTIL) (2.5 MG/3ML) 0.083% nebulizer solution Take 3 mLs (2.5 mg total) by nebulization every 6 (six) hours as needed for wheezing or shortness of breath.  . [DISCONTINUED] bisoprolol (ZEBETA) 5 MG tablet TAKE 1/2 TABLET BY MOUTH EVERY DAY FOR HIGH BLOOD PRESSURE  . [DISCONTINUED] fluticasone (FLONASE) 50 MCG/ACT nasal spray Place 2 sprays into both nostrils daily. For allergic rhinitis   No facility-administered encounter  medications on file as of 02/07/2017.     Allergies (verified) Ace inhibitors; Aspirin; Ibuprofen; Nsaids; and Lipitor [atorvastatin]   History: Past Medical History:  Diagnosis Date  . Anxiety   . Asthma   . Environmental allergies   . GERD (gastroesophageal reflux disease)   . Headache(784.0)   . Hypertension   . Respiratory failure, acute ([place]) 04/19/2014  . Shortness of breath    Past Surgical History:  Procedure Laterality Date  . ABDOMINAL HYSTERECTOMY  1993   [facility]    Family History  Problem Relation Age of Onset  . Heart attack Father   . Heart attack Mother   . Diabetes Sister   . High blood pressure Brother   . Hypertension Brother   . High blood pressure Sister   . Cancer Sister   . High blood pressure Son   . Cancer Maternal Grandmother   . Heart disease Paternal Grandmother   . Asthma Other        Great paternal grandfather   . Autism Son   . Asthma Other        Neice    Social History   Occupational History  . Not on file.   Social History Main Topics  . Smoking status: Never Smoker  . Smokeless tobacco: Never Used  . Alcohol use No  . Drug use: No  . Sexual activity: No    Tobacco Counseling Counseling given: Not Answered   Activities of Daily  Living In your present state of health, do you have any difficulty performing the following activities: 02/07/2017  Hearing? N  Vision? Y  Difficulty concentrating or making decisions? N  Walking or climbing stairs? N  Dressing or bathing? N  Doing errands, shopping? Y  Preparing Food and eating ? N  Using the Toilet? N  In the past six months, have you accidently leaked urine? N  Do you have problems with loss of bowel control? N  Managing your Medications? N  Managing your Finances? N  Housekeeping or managing your Housekeeping? N  Some recent data might be hidden    Immunizations and Health Maintenance Immunization History  Administered Date(s) Administered  .  Influenza,inj,Quad PF,36+ Mos 05/26/2014, 06/21/2016  . Influenza-Unspecified 05/06/2015   Health Maintenance Due  Topic Date Due  . Hepatitis C Screening  13-Jun-1961  . TETANUS/TDAP  11/18/1977    Patient Care Team: Gildardo Cranker, DO as PCP - General (Internal Medicine)  Indicate any recent Medical Services you may have received from other than [facility] providers in the past year (date may be approximate).     Assessment:   This is a routine wellness examination for Tammie [name].   Hearing/Vision screen No exam data present  Dietary issues and exercise activities discussed: Current Exercise Habits: The patient does not participate in regular exercise at present, Exercise limited by: None identified  Goals    . Exercise 3x per week (30 min per time)          Patient would like to start exercising 3 days a week.      Depression Screen PHQ 2/9 Scores 02/07/2017 02/07/2016 06/23/2014  PHQ - 2 Score 0 0 0    Fall Risk Fall Risk  02/07/2017 09/25/2016 02/07/2016 10/20/2015 07/21/2015  Falls in the past year? Yes No Yes No No  Number falls in past yr: 1 - 1 - -  Injury with Fall? Yes - No - -    Cognitive Function: MMSE - Mini Mental State Exam 02/07/2017  Not completed: (No Data)        Screening Tests Health Maintenance  Topic Date Due  . Hepatitis C Screening  Jun 10, 1961  . TETANUS/TDAP  11/18/1977  . MAMMOGRAM  09/24/2017 (Originally 11/18/2008)  . COLONOSCOPY  09/24/2017 (Originally 11/18/2008)  . INFLUENZA VACCINE  03/05/2017  . PAP SMEAR  06/23/2017  . HIV Screening  Completed      Plan:    I have personally reviewed and addressed the Medicare Annual Wellness questionnaire and have noted the following in the patient's chart:  A. Medical and social history B. Use of alcohol, tobacco or illicit drugs  C. Current medications and supplements D. Functional ability and status E.  Nutritional status F.  Physical activity G. Advance directives H. List of other physicians I.    Hospitalizations, surgeries, and ER visits in previous 12 months J.  [place] to include hearing, vision, cognitive, depression L. Referrals and appointments - none  In addition, I have reviewed and discussed with patient certain preventive protocols, quality metrics, and best practice recommendations. A written personalized care plan for preventive services as well as general preventive health recommendations were provided to patient.  See attached scanned questionnaire for additional information.   Signed,   Rich Reining, RN Nurse Health Advisor   Quick Notes   Health Maintenance: mammogram, colonoscopy, eye exam referrals put in. Hep C screen due.TDAP prescription sent to pharmacy     Abnormal Screen: Pt under age  65, no MMSE     Patient Concerns: Pain in neck and hips     Nurse Concerns: spoke about exercise options

## 2017-02-07 NOTE — Patient Instructions (Signed)
Tammie [name] , Thank you for taking time to come for your Medicare Wellness Visit. I appreciate your ongoing commitment to your health goals. Please review the following plan we discussed and let me know if I can assist you in the future.   Screening recommendations/referrals: Colonoscopy due, I will put in a referral Mammogram due, I will put in a referral Bone Density due at age 58 Recommended yearly ophthalmology/optometry visit for glaucoma screening and checkup Recommended yearly dental visit for hygiene and checkup  Vaccinations: Influenza vaccine up to date. Due 06/21/2017 Pneumococcal vaccine due at ae 65 Tdap vaccine due, I will sent prescription to your pharmacy Shingles vaccine due at age 61   Advanced directives: Advance directive discussed with you today. I have provided a copy for you to complete at home and have notarized. Once this is complete please bring a copy in to our office so we can scan it into your chart.   Conditions/risks identified: None  Next appointment: Dr. Eulas Post 02/12/17 @ 2:45pm  Preventive Care 40-64 Years, Female Preventive care refers to lifestyle choices and visits with your health care provider that can promote health and wellness. What does preventive care include?  A yearly physical exam. This is also called an annual well check.  Dental exams once or twice a year.  Routine eye exams. Ask your health care provider how often you should have your eyes checked.  Personal lifestyle choices, including:  Daily care of your teeth and gums.  Regular physical activity.  Eating a healthy diet.  Avoiding tobacco and drug use.  Limiting alcohol use.  Practicing safe sex.  Taking low-dose aspirin daily starting at age 17.  Taking vitamin and mineral supplements as recommended by your health care provider. What happens during an annual well check? The services and screenings done by your health care provider during your annual well check will  depend on your age, overall health, lifestyle risk factors, and family history of disease. Counseling  Your health care provider may ask you questions about your:  Alcohol use.  Tobacco use.  Drug use.  Emotional well-being.  Home and relationship well-being.  Sexual activity.  Eating habits.  Work and work Statistician.  Method of birth control.  Menstrual cycle.  Pregnancy history. Screening  You may have the following tests or measurements:  Height, weight, and BMI.  Blood pressure.  Lipid and cholesterol levels. These may be checked every 5 years, or more frequently if you are over 64 years old.  Skin check.  Lung cancer screening. You may have this screening every year starting at age 68 if you have a 30-pack-year history of smoking and currently smoke or have quit within the past 15 years.  Fecal occult blood test (FOBT) of the stool. You may have this test every year starting at age 34.  Flexible sigmoidoscopy or colonoscopy. You may have a sigmoidoscopy every 5 years or a colonoscopy every 10 years starting at age 37.  Hepatitis C blood test.  Hepatitis B blood test.  Sexually transmitted disease (STD) testing.  Diabetes screening. This is done by checking your blood sugar (glucose) after you have not eaten for a while (fasting). You may have this done every 1-3 years.  Mammogram. This may be done every 1-2 years. Talk to your health care provider about when you should start having regular mammograms. This may depend on whether you have a family history of breast cancer.  BRCA-related cancer screening. This may be done if you  have a family history of breast, ovarian, tubal, or peritoneal cancers.  Pelvic exam and Pap test. This may be done every 3 years starting at age 95. Starting at age 2, this may be done every 5 years if you have a Pap test in combination with an HPV test.  Bone density scan. This is done to screen for osteoporosis. You may have  this scan if you are at high risk for osteoporosis. Discuss your test results, treatment options, and if necessary, the need for more tests with your health care provider. Vaccines  Your health care provider may recommend certain vaccines, such as:  Influenza vaccine. This is recommended every year.  Tetanus, diphtheria, and acellular pertussis (Tdap, Td) vaccine. You may need a Td booster every 10 years.  Zoster vaccine. You may need this after age 43.  Pneumococcal 13-valent conjugate (PCV13) vaccine. You may need this if you have certain conditions and were not previously vaccinated.  Pneumococcal polysaccharide (PPSV23) vaccine. You may need one or two doses if you smoke cigarettes or if you have certain conditions. Talk to your health care provider about which screenings and vaccines you need and how often you need them. This information is not intended to replace advice given to you by your health care provider. Make sure you discuss any questions you have with your health care provider. Document Released: 08/18/2015 Document Revised: 04/10/2016 Document Reviewed: 05/23/2015 Elsevier Interactive Patient Education  2017 [employer] Prevention in the Home Falls can cause injuries. They can happen to people of all ages. There are many things you can do to make your home safe and to help prevent falls. What can I do on the outside of my home?  Regularly fix the edges of walkways and driveways and fix any cracks.  Remove anything that might make you trip as you walk through a door, such as a raised step or threshold.  Trim any bushes or trees on the path to your home.  Use bright outdoor lighting.  Clear any walking paths of anything that might make someone trip, such as rocks or tools.  Regularly check to see if handrails are loose or broken. Make sure that both sides of any steps have handrails.  Any raised decks and porches should have guardrails on the  edges.  Have any leaves, snow, or ice cleared regularly.  Use sand or salt on walking paths during winter.  Clean up any spills in your garage right away. This includes oil or grease spills. What can I do in the bathroom?  Use night lights.  Install grab bars by the toilet and in the tub and shower. Do not use towel bars as grab bars.  Use non-skid mats or decals in the tub or shower.  If you need to sit down in the shower, use a plastic, non-slip stool.  Keep the floor dry. Clean up any water that spills on the floor as soon as it happens.  Remove soap buildup in the tub or shower regularly.  Attach bath mats securely with double-sided non-slip rug tape.  Do not have throw rugs and other things on the floor that can make you trip. What can I do in the bedroom?  Use night lights.  Make sure that you have a light by your bed that is easy to reach.  Do not use any sheets or blankets that are too big for your bed. They should not hang down onto the floor.  Have  a firm chair that has side arms. You can use this for support while you get dressed.  Do not have throw rugs and other things on the floor that can make you trip. What can I do in the kitchen?  Clean up any spills right away.  Avoid walking on wet floors.  Keep items that you use a lot in easy-to-reach places.  If you need to reach something above you, use a strong step stool that has a grab bar.  Keep electrical cords out of the way.  Do not use floor polish or wax that makes floors slippery. If you must use wax, use non-skid floor wax.  Do not have throw rugs and other things on the floor that can make you trip. What can I do with my stairs?  Do not leave any items on the stairs.  Make sure that there are handrails on both sides of the stairs and use them. Fix handrails that are broken or loose. Make sure that handrails are as long as the stairways.  Check any carpeting to make sure that it is firmly  attached to the stairs. Fix any carpet that is loose or worn.  Avoid having throw rugs at the top or bottom of the stairs. If you do have throw rugs, attach them to the floor with carpet tape.  Make sure that you have a light switch at the top of the stairs and the bottom of the stairs. If you do not have them, ask someone to add them for you. What else can I do to help prevent falls?  Wear shoes that:  Do not have high heels.  Have rubber bottoms.  Are comfortable and fit you well.  Are closed at the toe. Do not wear sandals.  If you use a stepladder:  Make sure that it is fully opened. Do not climb a closed stepladder.  Make sure that both sides of the stepladder are locked into place.  Ask someone to hold it for you, if possible.  Clearly mark and make sure that you can see:  Any grab bars or handrails.  First and last steps.  Where the edge of each step is.  Use tools that help you move around (mobility aids) if they are needed. These include:  Canes.  Walkers.  Scooters.  Crutches.  Turn on the lights when you go into a dark area. Replace any light bulbs as soon as they burn out.  Set up your furniture so you have a clear path. Avoid moving your furniture around.  If any of your floors are uneven, fix them.  If there are any pets around you, be aware of where they are.  Review your medicines with your doctor. Some medicines can make you feel dizzy. This can increase your chance of falling. Ask your doctor what other things that you can do to help prevent falls. This information is not intended to replace advice given to you by your health care provider. Make sure you discuss any questions you have with your health care provider. Document Released: 05/18/2009 Document Revised: 12/28/2015 Document Reviewed: 08/26/2014 Elsevier Interactive Patient Education  2017 [employer].

## 2017-02-10 ENCOUNTER — Other Ambulatory Visit: Payer: Medicare HMO

## 2017-02-10 DIAGNOSIS — Z0000 Encounter for general adult medical examination without abnormal findings: Secondary | ICD-10-CM | POA: Diagnosis not present

## 2017-02-10 DIAGNOSIS — Z79899 Other long term (current) drug therapy: Secondary | ICD-10-CM

## 2017-02-10 LAB — COMPLETE METABOLIC PANEL WITH GFR
ALT: 14 U/L (ref 6–29)
AST: 18 U/L (ref 10–35)
Albumin: 4 g/dL (ref 3.6–5.1)
Alkaline Phosphatase: 139 U/L — ABNORMAL HIGH (ref 33–130)
BUN: 11 mg/dL (ref 7–25)
CHLORIDE: 105 mmol/L (ref 98–110)
CO2: 21 mmol/L (ref 20–31)
Calcium: 9.5 mg/dL (ref 8.6–10.4)
Creat: 0.85 mg/dL (ref 0.50–1.05)
GFR, EST NON AFRICAN AMERICAN: 76 mL/min (ref >=60)
GFR, Est African American: 87 mL/min (ref >=60)
GLUCOSE: 104 mg/dL — AB (ref 65–99)
POTASSIUM: 3.6 mmol/L (ref 3.5–5.3)
SODIUM: 142 mmol/L (ref 135–146)
Total Bilirubin: 0.6 mg/dL (ref 0.2–1.2)
Total Protein: 7.8 g/dL (ref 6.1–8.1)

## 2017-02-10 LAB — CBC WITH DIFFERENTIAL/PLATELET
BASOS PCT: 1 %
Basophils Absolute: 66 cells/uL (ref 0–200)
EOS ABS: 726 {cells}/uL — AB (ref 15–500)
Eosinophils Relative: 11 %
HEMATOCRIT: 40.4 % (ref 35.0–45.0)
HEMOGLOBIN: 13.3 g/dL (ref 11.7–15.5)
LYMPHS ABS: 2112 {cells}/uL (ref 850–3900)
Lymphocytes Relative: 32 %
MCH: 27.2 pg (ref 27.0–33.0)
MCHC: 32.9 g/dL (ref 32.0–36.0)
MCV: 82.6 fL (ref 80.0–100.0)
MONO ABS: 528 {cells}/uL (ref 200–950)
MONOS PCT: 8 %
MPV: 9.4 fL (ref 7.5–12.5)
NEUTROS ABS: 3168 {cells}/uL (ref 1500–7800)
Neutrophils Relative %: 48 %
PLATELETS: 283 10*3/uL (ref 140–400)
RBC: 4.89 MIL/uL (ref 3.80–5.10)
RDW: 13.5 % (ref 11.0–15.0)
WBC: 6.6 10*3/uL (ref 3.8–10.8)

## 2017-02-10 LAB — LIPID PANEL
CHOL/HDL RATIO: 3.5 ratio (ref <5.0)
Cholesterol: 156 mg/dL (ref <200)
HDL: 44 mg/dL — AB (ref >50)
LDL Cholesterol: 90 mg/dL (ref <100)
Triglycerides: 112 mg/dL (ref <150)
VLDL: 22 mg/dL (ref <30)

## 2017-02-10 LAB — TSH: TSH: 1.57 mIU/L

## 2017-02-11 DIAGNOSIS — Z0000 Encounter for general adult medical examination without abnormal findings: Secondary | ICD-10-CM | POA: Diagnosis not present

## 2017-02-11 DIAGNOSIS — Z79899 Other long term (current) drug therapy: Secondary | ICD-10-CM | POA: Diagnosis not present

## 2017-02-12 ENCOUNTER — Ambulatory Visit: Payer: Medicare Other

## 2017-02-12 ENCOUNTER — Ambulatory Visit (INDEPENDENT_AMBULATORY_CARE_PROVIDER_SITE_OTHER): Payer: Medicare HMO | Admitting: Nurse Practitioner

## 2017-02-12 ENCOUNTER — Encounter: Payer: Self-pay | Admitting: Nurse Practitioner

## 2017-02-12 ENCOUNTER — Encounter: Payer: Medicare Other | Admitting: Internal Medicine

## 2017-02-12 VITALS — BP 112/76 | HR 77 | Temp 98.1°F | Resp 18 | Ht <= 58 in | Wt 148.2 lb

## 2017-02-12 DIAGNOSIS — I10 Essential (primary) hypertension: Secondary | ICD-10-CM

## 2017-02-12 DIAGNOSIS — J4540 Moderate persistent asthma, uncomplicated: Secondary | ICD-10-CM

## 2017-02-12 DIAGNOSIS — Z0000 Encounter for general adult medical examination without abnormal findings: Secondary | ICD-10-CM

## 2017-02-12 DIAGNOSIS — M542 Cervicalgia: Secondary | ICD-10-CM | POA: Diagnosis not present

## 2017-02-12 DIAGNOSIS — F411 Generalized anxiety disorder: Secondary | ICD-10-CM | POA: Diagnosis not present

## 2017-02-12 DIAGNOSIS — H43392 Other vitreous opacities, left eye: Secondary | ICD-10-CM

## 2017-02-12 MED ORDER — ACETAMINOPHEN 325 MG PO TABS: 650.0000 mg | ORAL_TABLET | Freq: Four times a day (QID) | ORAL | Status: DC | PRN

## 2017-02-12 MED ORDER — CITALOPRAM HYDROBROMIDE 20 MG PO TABS: ORAL_TABLET | ORAL | 3 refills | Status: DC

## 2017-02-12 MED ORDER — FLUTICASONE-SALMETEROL 250-50 MCG/DOSE IN AEPB: 1.0000 | INHALATION_SPRAY | Freq: Two times a day (BID) | RESPIRATORY_TRACT | 3 refills | Status: DC

## 2017-02-12 NOTE — Progress Notes (Addendum)
Provider: Gildardo Cranker, DO  Patient Care Team: Gildardo Cranker, DO as PCP - General (Internal Medicine)  Extended Emergency Contact Information Primary Emergency Contact: Covington,Shirley Address: [address] [address] [place] Phone: [phone] Relation: Sister Secondary Emergency Contact: Winferd Humphrey [place] Phone: [phone] Relation: Brother Allergies  Allergen Reactions  . Ace Inhibitors Other (See Comments)    Angioedema  . Aspirin Anaphylaxis, Swelling and Other (See Comments)    Throat swelling   . Ibuprofen Anaphylaxis, Swelling and Other (See Comments)    Throat swelling  . Nsaids Anaphylaxis, Swelling and Other (See Comments)  . Lipitor [Atorvastatin] Swelling and Other (See Comments)    Face, feet swell   Code Status: FULL Goals of Care: Advanced Directive information Advanced Directives 02/12/2017  Does Patient Have a Medical Advance Directive? No  Would patient like information on creating a medical advance directive? Yes (MAU/Ambulatory/Procedural Areas - Information given)     Chief Complaint  Patient presents with  . Medical Management of Chronic Issues    Pt is being seen for a physical and follow up on chronic conditions.     HPI: Patient is a 56 y.o. female seen in today for an annual wellness exam.   Major illnesses or hospitalization in the last year: none Anxiety - stable mood on celexa (tells MA she has not been taking celexa). Denies anxiety attacks.   Asthma /allergic rhinitis- she has used albuterol HFA three times a day. This is the only inhaler she has. Takes singulair and zyrtec. She uses flonase daily as well. Was on advair in the past but now does not have an inhaler.    HTN - BP controlled on amlodipine, hydralazine and bisoprolol  Nasal mass vs polyps - she is s/p removal in May 2017 by Dr Redmond Baseman, ENT. She is now able to smell. No more rhinorrhea. Doing well.     Heart murmur - last 2D echo in 2015 revealed nml EF and mild AR  Depression screen [facility] 2/9 02/12/2017 02/07/2017 02/07/2016 06/23/2014  Decreased Interest 0 0 0 0  Down, Depressed, Hopeless 0 0 0 0  PHQ - 2 Score 0 0 0 0    Fall Risk  02/12/2017 02/07/2017 09/25/2016 02/07/2016 10/20/2015  Falls in the past year? No Yes No Yes No  Number falls in past yr: - 1 - 1 -  Injury with Fall? - Yes - No -   Fall in a parking lot maybe she tripped?, no injury   MMSE - [facility] [place] Exam 02/07/2017  Not completed: (No Data)     Health Maintenance  Topic Date Due  . Hepatitis C Screening  1961/01/17  . TETANUS/TDAP  11/18/1977  . MAMMOGRAM  09/24/2017 (Originally 11/18/2008)  . COLONOSCOPY  09/24/2017 (Originally 11/18/2008)  . INFLUENZA VACCINE  03/05/2017  . PAP SMEAR  06/23/2017  . HIV Screening  Completed    Diet? None.  Dentition: does not go due to cost  Pain: soreness in neck, 6/10, describes pain as tight and sore. off and on for over a year. Did not used to bother her bad but has progressively gotten worse. No injury. Has not taken anything for it. No paresthesia.  Worse at night, hard to get comfortable.    Past Medical History:  Diagnosis Date  . Anxiety   . Asthma   . Environmental allergies   . GERD (gastroesophageal reflux disease)   .  Headache(784.0)   . Hypertension   . Respiratory failure, acute ([place]) 04/19/2014  . Shortness of breath     Past Surgical History:  Procedure Laterality Date  . [facility]     Social History   Social History  . Marital status: Single    Spouse name: N/A  . Number of children: N/A  . Years of education: N/A   Social History Main Topics  . Smoking status: Never Smoker  . Smokeless tobacco: Never Used  . Alcohol use No  . Drug use: No  . Sexual activity: No   Other Topics Concern  . None   Social History Narrative   Diet-Salt Free   Drinks [employer]    Lives in an apartment,  one stories, one person in home, no pets   Current/past profession: Dietary, dish-room, and cooks    Exercises 2 x weekly                Family History  Problem Relation Age of Onset  . Heart attack Father   . Heart attack Mother   . Diabetes Sister   . High blood pressure Brother   . Hypertension Brother   . High blood pressure Sister   . Cancer Sister   . High blood pressure Son   . Cancer Maternal Grandmother   . Heart disease Paternal Grandmother   . Asthma Other        Great paternal grandfather   . Autism Son   . Asthma Other        Neice     Review of Systems:  Review of Systems  Constitutional: Negative for activity change, appetite change, fatigue and unexpected weight change.  HENT: Positive for dental problem (poor dentition). Negative for congestion and hearing loss.   Eyes: Positive for visual disturbance (floaters for ~3 months).  Respiratory: Positive for shortness of breath (asthma). Negative for cough.   Cardiovascular: Negative for chest pain, palpitations and leg swelling.  Gastrointestinal: Negative for abdominal pain, constipation and diarrhea.  Genitourinary: Negative for difficulty urinating and dysuria.  Musculoskeletal: Positive for neck pain. Negative for arthralgias and myalgias.  Skin: Negative for color change and wound.  Neurological: Negative for dizziness and weakness.  Psychiatric/Behavioral: Positive for confusion. Negative for agitation and behavioral problems.    Allergies as of 02/12/2017      Reactions   Ace Inhibitors Other (See Comments)   Angioedema   Aspirin Anaphylaxis, Swelling, Other (See Comments)   Throat swelling   Ibuprofen Anaphylaxis, Swelling, Other (See Comments)   Throat swelling   Nsaids Anaphylaxis, Swelling, Other (See Comments)   Lipitor [atorvastatin] Swelling, Other (See Comments)   Face, feet swell      Medication List       Accurate as of 02/12/17  2:05 PM. Always use your most recent med list.            albuterol 108 (90 Base) MCG/ACT inhaler Commonly known as:  PROVENTIL HFA;VENTOLIN HFA Inhale 2 puffs into the lungs every 6 (six) hours as needed for wheezing or shortness of breath.   amLODipine 10 MG tablet Commonly known as:  NORVASC TAKE 1 TABLET(10 MG) BY MOUTH DAILY FOR HIGH BLOOD PRESSURE   bisoprolol 5 MG tablet Commonly known as:  ZEBETA TAKE 1/2 TABLET BY MOUTH EVERY DAY FOR HIGH BLOOD PRESSURE   cetirizine 10 MG tablet Commonly known as:  ZYRTEC TAKE 1 TABLET BY MOUTH EVERY DAY   citalopram  20 MG tablet Commonly known as:  CELEXA TAKE 1 TABLET(20 MG) BY MOUTH DAILY   hydrALAZINE 25 MG tablet Commonly known as:  APRESOLINE TAKE 1 TABLET(25 MG) BY MOUTH EVERY 8 HOURS   montelukast 10 MG tablet Commonly known as:  SINGULAIR Take 1 tablet (10 mg total) by mouth at bedtime.         Physical Exam: Vitals:   02/12/17 1345  BP: 112/76  Pulse: 77  Resp: 18  Temp: 98.1 F (36.7 C)  TempSrc: Oral  SpO2: 96%  Weight: 148 lb 3.2 oz (67.2 kg)  Height: 4\' 9"  (1.448 m)   Body mass index is 32.07 kg/m. Physical Exam  Constitutional: She is oriented to person, place, and time. She appears well-developed and well-nourished.  HENT:  Mouth/Throat: Oropharynx is clear and moist. No oropharyngeal exudate.  Poor dentition   Eyes: Pupils are equal, round, and reactive to light. No scleral icterus.  Neck: Normal range of motion. Neck supple. Carotid bruit is not present. No tracheal deviation present.  Cardiovascular: Normal rate, regular rhythm and intact distal pulses.  Exam reveals no gallop and no friction rub.   Murmur (1/6 diastolic blowing murmur) heard. Pulmonary/Chest: Effort normal and breath sounds normal. No stridor. No respiratory distress. She has no wheezes. She has no rales.  Abdominal: Soft. Bowel sounds are normal. She exhibits no distension and no mass. There is no hepatomegaly. There is no tenderness. There is no rebound and no guarding.   obese  Musculoskeletal: Normal range of motion. She exhibits no edema, tenderness or deformity.  Lymphadenopathy:    She has no cervical adenopathy.  Neurological: She is alert and oriented to person, place, and time. She has normal reflexes.  Skin: Skin is warm and dry. No rash noted.  Psychiatric: She has a normal mood and affect. Her behavior is normal. Judgment and thought content normal.    Labs reviewed: Basic Metabolic Panel:  Recent Labs  06/21/16 1456 02/10/17 0816  NA 142 142  K 3.4* 3.6  CL 106 105  CO2 28 21  GLUCOSE 103* 104*  BUN 9 11  CREATININE 0.87 0.85  CALCIUM 9.6 9.5  TSH  --  1.57   Liver Function Tests:  Recent Labs  02/10/17 0816  AST 18  ALT 14  ALKPHOS 139*  BILITOT 0.6  PROT 7.8  ALBUMIN 4.0   No results for input(s): LIPASE, AMYLASE in the last 8760 hours. No results for input(s): AMMONIA in the last 8760 hours. CBC:  Recent Labs  02/10/17 0816  WBC 6.6  NEUTROABS 3,168  HGB 13.3  HCT 40.4  MCV 82.6  PLT 283   Lipid Panel:  Recent Labs  02/10/17 0816  CHOL 156  HDL 44*  LDLCALC 90  TRIG 112  CHOLHDL 3.5   No results found for: HGBA1C  Procedures: No results found.  Assessment/Plan 1. Wellness examination AWV completed. PREVENTIVE COUNSELING:  The patient was counseled regarding the appropriate use of alcohol, regular self-examination of the breasts on a monthly basis, prevention of dental and periodontal disease, diet, regular sustained exercise for at least 30 minutes 5 times per week, routine screening interval for mammogram as recommended by the [place] and ACOG, importance of regular PAP smears, tobacco use,  and recommended schedule for GI hemoccult testing, colonoscopy, cholesterol, thyroid and diabetes screening. - Ambulatory referral to [facility] for dental needs   2. Generalized anxiety disorder Stable, to cont on celexa - citalopram (CELEXA) 20 MG tablet;  TAKE 1 TABLET(20 MG) BY  MOUTH DAILY  Dispense: 30 tablet; Refill: 3  3. Essential hypertension -controlled on zebeta, norvasc, and hydralazine  - EKG 12-Lead- sinus rhythm HR 75  4. Neck pain Exam unremarkable. To use tylenol as needed for pain Muscle rub as needed to neck Heat as needed If worsen or fails to improve notify office   5. Moderate persistent asthma without complication - not well controled, only using albuterol  - Fluticasone-Salmeterol (ADVAIR DISKUS) 250-50 MCG/DOSE AEPB; Inhale 1 puff into the lungs 2 (two) times daily.  Dispense: 60 each; Refill: 3  6. Floaters in visual field, left - Ambulatory referral to Ophthalmology   To follow up in 3 months  Tayonna Bacha K. Harle Battiest  [facility] [phone] 8 am - 5 pm) [phone] (after hours)

## 2017-02-12 NOTE — Patient Instructions (Addendum)
Use tylenol as needed for pain Biofreeze/ben gay/ icy hot as needed to neck Heat as needed If worsen or fails to improve notify office   To use Advair twice daily scheduled and use ALBUTEROL as needed only   Ophthalmology referral placed due to floaters in eye

## 2017-03-10 DIAGNOSIS — H01024 Squamous blepharitis left upper eyelid: Secondary | ICD-10-CM | POA: Diagnosis not present

## 2017-03-10 DIAGNOSIS — H01021 Squamous blepharitis right upper eyelid: Secondary | ICD-10-CM | POA: Diagnosis not present

## 2017-03-10 DIAGNOSIS — H01022 Squamous blepharitis right lower eyelid: Secondary | ICD-10-CM | POA: Diagnosis not present

## 2017-03-10 DIAGNOSIS — H40013 Open angle with borderline findings, low risk, bilateral: Secondary | ICD-10-CM | POA: Diagnosis not present

## 2017-03-10 DIAGNOSIS — H25813 Combined forms of age-related cataract, bilateral: Secondary | ICD-10-CM | POA: Diagnosis not present

## 2017-03-10 DIAGNOSIS — H01025 Squamous blepharitis left lower eyelid: Secondary | ICD-10-CM | POA: Diagnosis not present

## 2017-03-25 ENCOUNTER — Ambulatory Visit: Payer: Medicare HMO

## 2017-03-26 ENCOUNTER — Encounter: Payer: Medicare HMO | Admitting: Internal Medicine

## 2017-03-27 ENCOUNTER — Encounter: Payer: Self-pay | Admitting: Internal Medicine

## 2017-04-17 ENCOUNTER — Encounter: Payer: Self-pay | Admitting: Internal Medicine

## 2017-04-21 ENCOUNTER — Other Ambulatory Visit: Payer: Self-pay | Admitting: Internal Medicine

## 2017-04-21 DIAGNOSIS — J4542 Moderate persistent asthma with status asthmaticus: Secondary | ICD-10-CM

## 2017-05-01 ENCOUNTER — Encounter: Payer: Self-pay | Admitting: Gastroenterology

## 2017-05-16 ENCOUNTER — Ambulatory Visit: Payer: Medicare HMO | Admitting: Internal Medicine

## 2017-05-27 ENCOUNTER — Other Ambulatory Visit: Payer: Self-pay | Admitting: Internal Medicine

## 2017-05-27 DIAGNOSIS — I10 Essential (primary) hypertension: Secondary | ICD-10-CM

## 2017-05-28 ENCOUNTER — Telehealth: Payer: Self-pay

## 2017-05-28 NOTE — Telephone Encounter (Signed)
John,      Just FYI.  2015 acute resp failure after taking ASA and NTG resulting in intubation.  No other mention of resp failure.  I assume OK for [facility]. Thank you for reviewing Tammie [name]/PV

## 2017-06-04 ENCOUNTER — Other Ambulatory Visit: Payer: Self-pay | Admitting: Internal Medicine

## 2017-06-04 DIAGNOSIS — I10 Essential (primary) hypertension: Secondary | ICD-10-CM

## 2017-06-05 ENCOUNTER — Ambulatory Visit (AMBULATORY_SURGERY_CENTER): Payer: Self-pay

## 2017-06-05 VITALS — Ht 59.0 in | Wt 150.8 lb

## 2017-06-05 DIAGNOSIS — Z1211 Encounter for screening for malignant neoplasm of colon: Secondary | ICD-10-CM

## 2017-06-05 MED ORDER — NA SULFATE-K SULFATE-MG SULF 17.5-3.13-1.6 GM/177ML PO SOLN: 1.0000 | Freq: Once | ORAL | 0 refills | Status: AC

## 2017-06-05 NOTE — Progress Notes (Signed)
Denies allergies to eggs or soy products. Denies complication of anesthesia or sedation. Denies use of weight loss medication. Denies use of O2.   Emmi instructions declined. No home computer.

## 2017-06-06 ENCOUNTER — Ambulatory Visit (INDEPENDENT_AMBULATORY_CARE_PROVIDER_SITE_OTHER): Payer: Medicare HMO | Admitting: Internal Medicine

## 2017-06-06 ENCOUNTER — Encounter: Payer: Self-pay | Admitting: Internal Medicine

## 2017-06-06 VITALS — BP 112/64 | HR 89 | Temp 98.5°F | Ht <= 58 in | Wt 151.2 lb

## 2017-06-06 DIAGNOSIS — J4540 Moderate persistent asthma, uncomplicated: Secondary | ICD-10-CM | POA: Diagnosis not present

## 2017-06-06 DIAGNOSIS — Z1231 Encounter for screening mammogram for malignant neoplasm of breast: Secondary | ICD-10-CM

## 2017-06-06 DIAGNOSIS — J3089 Other allergic rhinitis: Secondary | ICD-10-CM | POA: Diagnosis not present

## 2017-06-06 DIAGNOSIS — Z23 Encounter for immunization: Secondary | ICD-10-CM | POA: Diagnosis not present

## 2017-06-06 DIAGNOSIS — Z1239 Encounter for other screening for malignant neoplasm of breast: Secondary | ICD-10-CM

## 2017-06-06 DIAGNOSIS — I351 Nonrheumatic aortic (valve) insufficiency: Secondary | ICD-10-CM | POA: Diagnosis not present

## 2017-06-06 DIAGNOSIS — F411 Generalized anxiety disorder: Secondary | ICD-10-CM | POA: Diagnosis not present

## 2017-06-06 DIAGNOSIS — I10 Essential (primary) hypertension: Secondary | ICD-10-CM | POA: Diagnosis not present

## 2017-06-06 MED ORDER — BISOPROLOL FUMARATE 5 MG PO TABS: ORAL_TABLET | ORAL | 3 refills | Status: DC

## 2017-06-06 MED ORDER — TETANUS-DIPHTH-ACELL PERTUSSIS 5-2.5-18.5 LF-MCG/0.5 IM SUSP: 0.5000 mL | Freq: Once | INTRAMUSCULAR | 0 refills | Status: AC

## 2017-06-06 MED ORDER — HYDRALAZINE HCL 25 MG PO TABS: ORAL_TABLET | ORAL | 3 refills | Status: DC

## 2017-06-06 MED ORDER — FLUTICASONE PROPIONATE 50 MCG/ACT NA SUSP: 2.0000 | Freq: Every day | NASAL | 6 refills | Status: DC

## 2017-06-06 NOTE — Progress Notes (Signed)
Patient ID: Tammie [name], female   DOB: 02/05/1961, 56 y.o.   MRN: [id number]    Location:  [facility] Place of Service: OFFICE  Chief Complaint  Patient presents with  . Medical Management of Chronic Issues    3 month follow-up, fasting if labs due   . Medication Refill    Zebeta, celexa, and hydralazine. FYI out of celexa since August 2018  . Health maintenance    Pending colonoscopy 06/19/17, mammogram (order for next month), TDaP(rx printed),     HPI:  56 yo female seen today for f/u. She c/o nasal congestion. She uses daily generic flonase. No other concerns. She needs med RF. Colonoscopy scheduled for 11/15th.   Anxiety - stable off celexa. No panic attacks.   Asthma moderate persistent/allergic rhinitis - She uses albuterol HFA daily and takes singulair and zyrtec. She uses flonase daily.  No recent asthma exacerbation    HTN - BP controlled on amlodipine, hydralazine and bisoprolol  Hx Nasal mass vs polyps - she is s/p removal in May 2017 by Dr Redmond Baseman, ENT. She has rhinorrhea  Heart murmur - 2D echo in Aug 2017 revealed EF 55-60%;  Grade 1 DD; mild AI, MR, TR ;mild pulm HTN (32 mm Hg)   Past Medical History:  Diagnosis Date  . Allergy   . Anemia   . Anxiety   . Asthma   . Environmental allergies   . GERD (gastroesophageal reflux disease)    Patient denies  . Headache(784.0)   . Hypertension   . Respiratory failure, acute ([place]) 04/19/2014  . Shortness of breath     Past Surgical History:  Procedure Laterality Date  . [place]     Polyps    Patient Care Team: Gildardo Cranker, DO as PCP - General (Internal Medicine)  Social History   Social History  . Marital status: Single    Spouse name: N/A  . Number of children: N/A  . Years of education: N/A   Occupational History  . Not on file.   Social History Main Topics  . Smoking status: Never Smoker  . Smokeless tobacco: Never Used  . Alcohol  use No  . Drug use: No  . Sexual activity: No   Other Topics Concern  . Not on file   Social History Narrative   Diet-Salt Free   Drinks [employer]    Lives in an apartment, one stories, one person in home, no pets   Current/past profession: Dietary, dish-room, and cooks    Exercises 2 x weekly                 reports that she has never smoked. She has never used smokeless tobacco. She reports that she does not drink alcohol or use drugs.  Family History  Problem Relation Age of Onset  . Heart attack Father   . Heart attack Mother   . Diabetes Sister   . High blood pressure Brother   . Hypertension Brother   . High blood pressure Sister   . Cancer Sister   . High blood pressure Son   . Cancer Maternal Grandmother   . Heart disease Paternal Grandmother   . Pancreatic cancer Paternal Grandmother   . Asthma Other        Great paternal grandfather   . Autism Son   . Asthma Other        Neice   . Colon cancer Neg  Hx   . Esophageal cancer Neg Hx   . Rectal cancer Neg Hx   . Stomach cancer Neg Hx    Family Status  Relation Status  . Father Deceased at age 62       heart disease  . Mother Deceased at age 64       heart disease, dementia  . Sister Alive  . Brother Alive  . Sister Alive  . Son (Not Specified)  . MGM (Not Specified)  . PGM (Not Specified)  . Other (Not Specified)  . Son (Not Specified)  . Other (Not Specified)  . Neg Hx (Not Specified)     Allergies  Allergen Reactions  . Ace Inhibitors Other (See Comments)    Angioedema  . Aspirin Anaphylaxis, Swelling and Other (See Comments)    Throat swelling   . Ibuprofen Anaphylaxis, Swelling and Other (See Comments)    Throat swelling  . Nsaids Anaphylaxis, Swelling and Other (See Comments)  . Lipitor [Atorvastatin] Swelling and Other (See Comments)    Face, feet swell    Medications: Patient's Medications  New Prescriptions   No medications on file  Previous Medications   ACETAMINOPHEN  (TYLENOL) 325 MG TABLET    Take 2 tablets (650 mg total) by mouth every 6 (six) hours as needed.   ALBUTEROL (PROVENTIL HFA;VENTOLIN HFA) 108 (90 BASE) MCG/ACT INHALER    Inhale 2 puffs into the lungs every 6 (six) hours as needed for wheezing or shortness of breath.   AMLODIPINE (NORVASC) 10 MG TABLET    TAKE 1 TABLET(10 MG) BY MOUTH DAILY FOR HIGH BLOOD PRESSURE   BISOPROLOL (ZEBETA) 5 MG TABLET    TAKE 1/2 TABLET BY MOUTH EVERY DAY FOR HIGH BLOOD PRESSURE   CETIRIZINE (ZYRTEC) 10 MG TABLET    TAKE 1 TABLET(10 MG) BY MOUTH DAILY   CITALOPRAM (CELEXA) 20 MG TABLET    TAKE 1 TABLET(20 MG) BY MOUTH DAILY   FLUTICASONE-SALMETEROL (ADVAIR DISKUS) 250-50 MCG/DOSE AEPB    Inhale 1 puff into the lungs 2 (two) times daily.   HYDRALAZINE (APRESOLINE) 25 MG TABLET    TAKE 1 TABLET(25 MG) BY MOUTH EVERY 8 HOURS   MONTELUKAST (SINGULAIR) 10 MG TABLET    TAKE 1 TABLET(10 MG) BY MOUTH AT BEDTIME   TDAP (BOOSTRIX) 5-2.5-18.5 LF-MCG/0.5 INJECTION    Inject 0.5 mLs into the muscle once.  Modified Medications   No medications on file  Discontinued Medications   No medications on file    Review of Systems  HENT: Positive for rhinorrhea.   Psychiatric/Behavioral: The patient is not nervous/anxious.   All other systems reviewed and are negative.   Vitals:   06/06/17 0822  BP: 112/64  Pulse: 89  Temp: 98.5 F (36.9 C)  TempSrc: Oral  SpO2: 96%  Weight: 151 lb 3.2 oz (68.6 kg)  Height: '4\' 9"'  (1.448 m)   Body mass index is 32.72 kg/m.  Physical Exam  Constitutional: She is oriented to person, place, and time. She appears well-developed and well-nourished.  HENT:  Mouth/Throat: Oropharynx is clear and moist. No oropharyngeal exudate.  MMM; no oral thrush  Eyes: Pupils are equal, round, and reactive to light. No scleral icterus.  Neck: Neck supple. Carotid bruit is not present. No tracheal deviation present. No thyromegaly present.  Cardiovascular: Normal rate, regular rhythm and intact distal  pulses.  Exam reveals no gallop and no friction rub.   Murmur (1/6 blowing murmur) heard. No LE edema b/l. no calf TTP.  Pulmonary/Chest: Effort normal and breath sounds normal. No stridor. No respiratory distress. She has no wheezes. She has no rales.  Abdominal: Soft. Normal appearance and bowel sounds are normal. She exhibits no distension and no mass. There is no hepatomegaly. There is no tenderness. There is no rigidity, no rebound and no guarding. No hernia.  Lymphadenopathy:    She has no cervical adenopathy.  Neurological: She is alert and oriented to person, place, and time.  Skin: Skin is warm and dry. No rash noted.  Psychiatric: She has a normal mood and affect. Her behavior is normal. Judgment and thought content normal.     Labs reviewed: No visits with results within 3 Month(s) from this visit.  Latest known visit with results is:  Appointment on 02/10/2017  Component Date Value Ref Range Status  . Sodium 02/10/2017 142  135 - 146 mmol/L Final  . Potassium 02/10/2017 3.6  3.5 - 5.3 mmol/L Final  . Chloride 02/10/2017 105  98 - 110 mmol/L Final  . CO2 02/10/2017 21  20 - 31 mmol/L Final  . Glucose, Bld 02/10/2017 104* 65 - 99 mg/dL Final  . BUN 02/10/2017 11  7 - 25 mg/dL Final  . Creat 02/10/2017 0.85  0.50 - 1.05 mg/dL Final   Comment:   For patients > or = 58 years of age: The upper reference limit for Creatinine is approximately 13% higher for people identified as African-American.     . Total Bilirubin 02/10/2017 0.6  0.2 - 1.2 mg/dL Final  . Alkaline Phosphatase 02/10/2017 139* 33 - 130 U/L Final  . AST 02/10/2017 18  10 - 35 U/L Final  . ALT 02/10/2017 14  6 - 29 U/L Final  . Total Protein 02/10/2017 7.8  6.1 - 8.1 g/dL Final  . Albumin 02/10/2017 4.0  3.6 - 5.1 g/dL Final  . Calcium 02/10/2017 9.5  8.6 - 10.4 mg/dL Final  . GFR, Est African American 02/10/2017 87  >=60 mL/min Final  . GFR, Est Non African American 02/10/2017 76  >=60 mL/min Final  . TSH  02/10/2017 1.57  mIU/L Final   Comment:   Reference Range   > or = 20 Years  0.40-4.50   Pregnancy Range First trimester  0.26-2.66 Second trimester 0.55-2.73 Third trimester  0.43-2.91     . WBC 02/10/2017 6.6  3.8 - 10.8 K/uL Final  . RBC 02/10/2017 4.89  3.80 - 5.10 MIL/uL Final  . Hemoglobin 02/10/2017 13.3  11.7 - 15.5 g/dL Final  . HCT 02/10/2017 40.4  35.0 - 45.0 % Final  . MCV 02/10/2017 82.6  80.0 - 100.0 fL Final  . MCH 02/10/2017 27.2  27.0 - 33.0 pg Final  . MCHC 02/10/2017 32.9  32.0 - 36.0 g/dL Final  . RDW 02/10/2017 13.5  11.0 - 15.0 % Final  . Platelets 02/10/2017 283  140 - 400 K/uL Final  . MPV 02/10/2017 9.4  7.5 - 12.5 fL Final  . Neutro Abs 02/10/2017 3168  1,500 - 7,800 cells/uL Final  . Lymphs Abs 02/10/2017 2112  850 - 3,900 cells/uL Final  . Monocytes Absolute 02/10/2017 528  200 - 950 cells/uL Final  . Eosinophils Absolute 02/10/2017 726* 15 - 500 cells/uL Final  . Basophils Absolute 02/10/2017 66  0 - 200 cells/uL Final  . Neutrophils Relative % 02/10/2017 48  % Final  . Lymphocytes Relative 02/10/2017 32  % Final  . Monocytes Relative 02/10/2017 8  % Final  . Eosinophils Relative 02/10/2017 11  %  Final  . Basophils Relative 02/10/2017 1  % Final  . Smear Review 02/10/2017 Criteria for review not met   Final  . Cholesterol 02/10/2017 156  <200 mg/dL Final  . Triglycerides 02/10/2017 112  <150 mg/dL Final  . HDL 02/10/2017 44* >50 mg/dL Final  . Total CHOL/HDL Ratio 02/10/2017 3.5  <5.0 Ratio Final  . VLDL 02/10/2017 22  <30 mg/dL Final  . LDL Cholesterol 02/10/2017 90  <100 mg/dL Final    No results found.   Assessment/Plan   ICD-10-CM   1. Essential hypertension I10 hydrALAZINE (APRESOLINE) 25 MG tablet    bisoprolol (ZEBETA) 5 MG tablet  2. Moderate persistent asthma without complication X90.24   3. Generalized anxiety disorder F41.1   4. Aortic valve insufficiency, etiology of cardiac valve disease unspecified I35.1   5. Breast cancer  screening Z12.31 MM DIGITAL SCREENING BILATERAL  6. Need for Tdap vaccination Z23 Tdap (BOOSTRIX) 5-2.5-18.5 LF-MCG/0.5 injection  7. Non-seasonal allergic rhinitis, unspecified trigger J30.89 fluticasone (FLONASE) 50 MCG/ACT nasal spray    STOP CITALOPRAM as you completed course  START FLUTICASONE NASAL SPRAY for seasonal allergy as directed  Continue other medications as ordered  Follow up in 3 mos for anxiety, asthma, seasonal allergy and HTN. Will check CMP at next [facility] S. Perlie Gold  [facility] [address] [place], [place] [address] [phone] Cell (Monday-Friday 8 AM - 5 PM) [phone] After 5 PM and follow prompts

## 2017-06-06 NOTE — Patient Instructions (Signed)
STOP CITALOPRAM as you completed course  START FLUTICASONE NASAL SPRAY for seasonal allergy as directed  Continue other medications as ordered  Follow up in 3 mos for anxiety, asthma, seasonal allergy and HTN

## 2017-06-10 ENCOUNTER — Encounter: Payer: Self-pay | Admitting: Gastroenterology

## 2017-06-19 ENCOUNTER — Ambulatory Visit (AMBULATORY_SURGERY_CENTER): Payer: Medicare HMO | Admitting: Gastroenterology

## 2017-06-19 ENCOUNTER — Other Ambulatory Visit: Payer: Self-pay

## 2017-06-19 VITALS — BP 125/76 | HR 73 | Temp 98.7°F | Resp 20 | Ht 59.0 in | Wt 150.0 lb

## 2017-06-19 DIAGNOSIS — D129 Benign neoplasm of anus and anal canal: Secondary | ICD-10-CM

## 2017-06-19 DIAGNOSIS — K635 Polyp of colon: Secondary | ICD-10-CM

## 2017-06-19 DIAGNOSIS — D125 Benign neoplasm of sigmoid colon: Secondary | ICD-10-CM | POA: Diagnosis not present

## 2017-06-19 DIAGNOSIS — D124 Benign neoplasm of descending colon: Secondary | ICD-10-CM

## 2017-06-19 DIAGNOSIS — D126 Benign neoplasm of colon, unspecified: Secondary | ICD-10-CM

## 2017-06-19 DIAGNOSIS — D128 Benign neoplasm of rectum: Secondary | ICD-10-CM | POA: Diagnosis not present

## 2017-06-19 DIAGNOSIS — Z1212 Encounter for screening for malignant neoplasm of rectum: Secondary | ICD-10-CM | POA: Diagnosis not present

## 2017-06-19 DIAGNOSIS — K621 Rectal polyp: Secondary | ICD-10-CM

## 2017-06-19 DIAGNOSIS — Z1211 Encounter for screening for malignant neoplasm of colon: Secondary | ICD-10-CM | POA: Diagnosis present

## 2017-06-19 DIAGNOSIS — I10 Essential (primary) hypertension: Secondary | ICD-10-CM | POA: Diagnosis not present

## 2017-06-19 DIAGNOSIS — D122 Benign neoplasm of ascending colon: Secondary | ICD-10-CM

## 2017-06-19 DIAGNOSIS — J45909 Unspecified asthma, uncomplicated: Secondary | ICD-10-CM | POA: Diagnosis not present

## 2017-06-19 DIAGNOSIS — D127 Benign neoplasm of rectosigmoid junction: Secondary | ICD-10-CM

## 2017-06-19 DIAGNOSIS — D123 Benign neoplasm of transverse colon: Secondary | ICD-10-CM

## 2017-06-19 MED ORDER — SODIUM CHLORIDE 0.9 % IV SOLN: 500.0000 mL | INTRAVENOUS | Status: DC

## 2017-06-19 NOTE — Patient Instructions (Signed)
YOU HAD AN ENDOSCOPIC PROCEDURE TODAY AT [facility]:   Refer to the procedure report that was given to you for any specific questions about what was found during the examination.  If the procedure report does not answer your questions, please call your gastroenterologist to clarify.  If you requested that your care partner not be given the details of your procedure findings, then the procedure report has been included in a sealed envelope for you to review at your convenience later.  YOU SHOULD EXPECT: Some feelings of bloating in the abdomen. Passage of more gas than usual.  Walking can help get rid of the air that was put into your GI tract during the procedure and reduce the bloating. If you had a lower endoscopy (such as a colonoscopy or flexible sigmoidoscopy) you may notice spotting of blood in your stool or on the toilet paper. If you underwent a bowel prep for your procedure, you may not have a normal bowel movement for a few days.  Please Note:  You might notice some irritation and congestion in your nose or some drainage.  This is from the oxygen used during your procedure.  There is no need for concern and it should clear up in a day or so.  SYMPTOMS TO REPORT IMMEDIATELY:   Following lower endoscopy (colonoscopy or flexible sigmoidoscopy):  Excessive amounts of blood in the stool  Significant tenderness or worsening of abdominal pains  Swelling of the abdomen that is new, acute  Fever of 100F or higher  For urgent or emergent issues, a gastroenterologist can be reached at any hour by calling [phone].   DIET:  We do recommend a small meal at first, but then you may proceed to your regular diet.  Drink plenty of fluids but you should avoid alcoholic beverages for 24 hours.  ACTIVITY:  You should plan to take it easy for the rest of today and you should NOT DRIVE or use heavy machinery until tomorrow (because of the sedation medicines used during the test).     FOLLOW UP: Our staff will call the number listed on your records the next business day following your procedure to check on you and address any questions or concerns that you may have regarding the information given to you following your procedure. If we do not reach you, we will leave a message.  However, if you are feeling well and you are not experiencing any problems, there is no need to return our call.  We will assume that you have returned to your regular daily activities without incident.  If any biopsies were taken you will be contacted by phone or by letter within the next 1-3 weeks.  Please call us at [phone] if you have not heard about the biopsies in 3 weeks.   Await for biopsy results to determine next repeat Colonoscopy screening Polyps (handout given) .Marland KitchenNo ibuprofen, naproxen or other non-steroidal anti-inflammatory drugs for 2 weeks after polyp removal. Tylenol okay if needed. Hemorrhoids (handout given) Diverticulosis (handout given)   SIGNATURES/CONFIDENTIALITY: You and/or your care partner have signed paperwork which will be entered into your electronic medical record.  These signatures attest to the fact that that the information above on your After Visit Summary has been reviewed and is understood.  Full responsibility of the confidentiality of this discharge information lies with you and/or your care-partner.

## 2017-06-19 NOTE — Op Note (Signed)
[facility] Patient Name: Tammie [name] Procedure Date: 06/19/2017 1:11 PM MRN: [id number] Endoscopist: Remo Lipps P. Makaylyn Sinyard MD, MD Age: 56 Referring MD:  Date of Birth: [birth year] Gender: Female Account #: [id number] Procedure:                Colonoscopy Indications:              Screening for colorectal malignant neoplasm, This                            is the patient's first colonoscopy Medicines:                Monitored Anesthesia Care Procedure:                Pre-Anesthesia Assessment:                           - Prior to the procedure, a History and Physical                            was performed, and patient medications and                            allergies were reviewed. The patient's tolerance of                            previous anesthesia was also reviewed. The risks                            and benefits of the procedure and the sedation                            options and risks were discussed with the patient.                            All questions were answered, and informed consent                            was obtained. Prior Anticoagulants: The patient has                            taken no previous anticoagulant or antiplatelet                            agents. ASA Grade Assessment: II - A patient with                            mild systemic disease. After reviewing the risks                            and benefits, the patient was deemed in                            satisfactory condition to undergo the procedure.  After obtaining informed consent, the colonoscope                            was passed under direct vision. Throughout the                            procedure, the patient's blood pressure, pulse, and                            oxygen saturations were monitored continuously. The                            Colonoscope was introduced through the anus and                            advanced to the  the cecum, identified by                            appendiceal orifice and ileocecal valve. The                            colonoscopy was performed without difficulty. The                            patient tolerated the procedure well. The quality                            of the bowel preparation was good. The ileocecal                            valve, appendiceal orifice, and rectum were                            photographed. Scope In: 1:29:49 PM Scope Out: 1:54:54 PM Scope Withdrawal Time: 0 hours 17 minutes 35 seconds  Total Procedure Duration: 0 hours 25 minutes 5 seconds  Findings:                 The perianal and digital rectal examinations were                            normal.                           Two sessile polyps were found in the ascending                            colon. The polyps were 4 to 5 mm in size. These                            polyps were removed with a cold snare. Resection                            and retrieval were complete.  A 5 mm polyp was found in the hepatic flexure. The                            polyp was sessile. The polyp was removed with a                            cold snare. Resection and retrieval were complete.                           A 4 mm polyp was found in the descending colon. The                            polyp was sessile. The polyp was removed with a                            cold snare. Resection and retrieval were complete.                           Two sessile polyps were found in the sigmoid colon.                            The polyps were 4 to 5 mm in size. These polyps                            were removed with a cold snare. Resection and                            retrieval were complete.                           A 4 mm polyp was found in the recto-sigmoid colon.                            The polyp was sessile. The polyp was removed with a                            cold snare.  Resection and retrieval were complete.                           A 3 mm polyp was found in the rectum. The polyp was                            sessile. The polyp was removed with a cold snare.                            Resection and retrieval were complete.                           Multiple medium-mouthed diverticula were found in                            the  left colon.                           Internal hemorrhoids were found during retroflexion.                           The exam was otherwise without abnormality. Complications:            No immediate complications. Estimated blood loss:                            Minimal. Estimated Blood Loss:     Estimated blood loss was minimal. Impression:               - Two 4 to 5 mm polyps in the ascending colon,                            removed with a cold snare. Resected and retrieved.                           - One 5 mm polyp at the hepatic flexure, removed                            with a cold snare. Resected and retrieved.                           - One 4 mm polyp in the descending colon, removed                            with a cold snare. Resected and retrieved.                           - Two 4 to 5 mm polyps in the sigmoid colon,                            removed with a cold snare. Resected and retrieved.                           - One 4 mm polyp at the recto-sigmoid colon,                            removed with a cold snare. Resected and retrieved.                           - One 3 mm polyp in the rectum, removed with a cold                            snare. Resected and retrieved.                           - Diverticulosis in the left colon.                           - Internal hemorrhoids.                           -  The examination was otherwise normal. Recommendation:           - Patient has a contact number available for                            emergencies. The signs and symptoms of potential                             delayed complications were discussed with the                            patient. Return to normal activities tomorrow.                            Written discharge instructions were provided to the                            patient.                           - Resume previous diet.                           - Continue present medications.                           - Await pathology results.                           - Repeat colonoscopy is recommended for                            surveillance. The colonoscopy date will be                            determined after pathology results from today's                            exam become available for review.                           - No ibuprofen, naproxen, or other non-steroidal                            anti-inflammatory drugs for 2 weeks after polyp                            removal. Remo Lipps P. Gaston Dase MD, MD 06/19/2017 2:00:22 PM This report has been signed electronically.

## 2017-06-19 NOTE — Progress Notes (Signed)
A/ox3 pleased with MAC, report to Michele RN 

## 2017-06-19 NOTE — Progress Notes (Signed)
Called to room to assist during endoscopic procedure.  Patient ID and intended procedure confirmed with present staff. Received instructions for my participation in the procedure from the performing physician.  

## 2017-06-20 ENCOUNTER — Telehealth: Payer: Self-pay | Admitting: *Deleted

## 2017-06-20 NOTE — Telephone Encounter (Signed)
  Follow up Call-  Call back number 06/19/2017  Post procedure Call Back phone  # [phone]  Permission to leave phone message Yes  Some recent data might be hidden     Patient questions:  Do you have a fever, pain , or abdominal swelling? No. Pain Score  0 *  Have you tolerated food without any problems? Yes.    Have you been able to return to your normal activities? Yes.    Do you have any questions about your discharge instructions: Diet   No. Medications  No. Follow up visit  No.  Do you have questions or concerns about your Care? No.  Actions: * If pain score is 4 or above: No action needed, pain <4.

## 2017-06-25 ENCOUNTER — Other Ambulatory Visit: Payer: Self-pay | Admitting: Internal Medicine

## 2017-06-25 ENCOUNTER — Encounter: Payer: Self-pay | Admitting: Gastroenterology

## 2017-06-25 DIAGNOSIS — J4542 Moderate persistent asthma with status asthmaticus: Secondary | ICD-10-CM

## 2017-08-14 ENCOUNTER — Other Ambulatory Visit: Payer: Self-pay | Admitting: Internal Medicine

## 2017-08-14 DIAGNOSIS — I10 Essential (primary) hypertension: Secondary | ICD-10-CM

## 2017-09-09 ENCOUNTER — Other Ambulatory Visit: Payer: Self-pay | Admitting: Internal Medicine

## 2017-09-09 ENCOUNTER — Encounter: Payer: Self-pay | Admitting: Internal Medicine

## 2017-09-09 ENCOUNTER — Ambulatory Visit (INDEPENDENT_AMBULATORY_CARE_PROVIDER_SITE_OTHER): Payer: Medicare HMO | Admitting: Internal Medicine

## 2017-09-09 VITALS — BP 130/70 | HR 75 | Temp 97.4°F | Resp 20 | Ht 59.0 in | Wt 150.4 lb

## 2017-09-09 DIAGNOSIS — F411 Generalized anxiety disorder: Secondary | ICD-10-CM

## 2017-09-09 DIAGNOSIS — M542 Cervicalgia: Secondary | ICD-10-CM | POA: Diagnosis not present

## 2017-09-09 DIAGNOSIS — J4540 Moderate persistent asthma, uncomplicated: Secondary | ICD-10-CM

## 2017-09-09 DIAGNOSIS — Z79899 Other long term (current) drug therapy: Secondary | ICD-10-CM

## 2017-09-09 DIAGNOSIS — I10 Essential (primary) hypertension: Secondary | ICD-10-CM | POA: Diagnosis not present

## 2017-09-09 DIAGNOSIS — D126 Benign neoplasm of colon, unspecified: Secondary | ICD-10-CM

## 2017-09-09 LAB — COMPLETE METABOLIC PANEL WITH GFR
AG Ratio: 1.3 (calc) (ref 1.0–2.5)
ALT: 17 U/L (ref 6–29)
AST: 22 U/L (ref 10–35)
Albumin: 4.2 g/dL (ref 3.6–5.1)
Alkaline phosphatase (APISO): 111 U/L (ref 33–130)
BILIRUBIN TOTAL: 0.7 mg/dL (ref 0.2–1.2)
BUN: 7 mg/dL (ref 7–25)
CHLORIDE: 107 mmol/L (ref 98–110)
CO2: 32 mmol/L (ref 20–32)
Calcium: 9.7 mg/dL (ref 8.6–10.4)
Creat: 0.99 mg/dL (ref 0.50–1.05)
GFR, EST AFRICAN AMERICAN: 73 mL/min/{1.73_m2} (ref >=60)
GFR, Est Non African American: 63 mL/min/{1.73_m2} (ref >=60)
GLUCOSE: 94 mg/dL (ref 65–99)
Globulin: 3.2 g/dL (calc) (ref 1.9–3.7)
POTASSIUM: 3.6 mmol/L (ref 3.5–5.3)
Sodium: 141 mmol/L (ref 135–146)
TOTAL PROTEIN: 7.4 g/dL (ref 6.1–8.1)

## 2017-09-09 NOTE — Patient Instructions (Addendum)
Continue current medications as ordered  May take Tylenol ES for pain as needed  AVOID NSAIDS DUE TO ALLERGY  Will call with xray results  Will call with lab results  Follow up in 6 mos for AWV/CPE.

## 2017-09-09 NOTE — Progress Notes (Signed)
Patient ID: Tammie [name], female   DOB: [birth year], 59 y.o.   MRN: [id number]   Location:  [facility] OFFICE  Provider: DR Arletha Grippe  Code Status:  Goals of Care:  Advanced Directives 06/19/2017  Does Patient Have a Medical Advance Directive? No  Would patient like information on creating a medical advance directive? No - Patient declined     Chief Complaint  Patient presents with  . Medical Management of Chronic Issues    3 mo f/u- anxiety, asthma, HTN,    HPI: Patient is a 57 y.o. female seen today for medical management of chronic diseases.  She c/o constant left neck pain that improves with Tylenol.  No known injury. No dizziness but has occasional HA. 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 She has intermittent needle-like tingling/numbness in finger tips. She has never had imaging studies. She has occasional hot flashes.  She had colonoscopy in 06/2017 - several precancerous polyps (tubular adenomas) removed and some hyperplastic polyps. She is due for repeat colonoscopy in 2021  Anxiety - mood stable on citalopram. No panic attacks. She failed GDR  Asthma moderate persistent/allergic rhinitis - She uses albuterol HFA daily and takes singulair and zyrtec. She uses flonase daily.  No recent asthma exacerbation    HTN - BP controlled on amlodipine, hydralazine and bisoprolol  Hx Nasal mass vs polyps - she is s/p removal in May 2017 by Dr Redmond Baseman, ENT. She has rhinorrhea  Heart murmur - 2D echo in Aug 2017 revealed EF 55-60%;  Grade 1 DD; mild AI, MR, TR ;mild pulm HTN (32 mm Hg)   Past Medical History:  Diagnosis Date  . Allergy   . Anemia   . Anxiety   . Asthma   . Environmental allergies   . GERD (gastroesophageal reflux disease)    Patient denies  . Headache(784.0)   . Hypertension   . Respiratory failure, acute ([place]) 04/19/2014  . Shortness of breath     Past Surgical  History:  Procedure Laterality Date  . [place]     Polyps     reports that  has never smoked. she has never used smokeless tobacco. She reports that she does not drink alcohol or use drugs. Social History   Socioeconomic History  . Marital status: Single    Spouse name: Not on file  . Number of children: Not on file  . Years of education: Not on file  . Highest education level: Not on file  Social Needs  . Financial resource strain: Not on file  . Food insecurity - worry: Not on file  . Food insecurity - inability: Not on file  . Transportation needs - medical: Not on file  . Transportation needs - non-medical: Not on file  Occupational History  . Not on file  Tobacco Use  . Smoking status: Never Smoker  . Smokeless tobacco: Never Used  Substance and Sexual Activity  . Alcohol use: No  . Drug use: No  . Sexual activity: No  Other Topics Concern  . Not on file  Social History Narrative   Diet-Salt Free   Drinks [employer]    Lives in an apartment, one stories, one person in home, no pets   Current/past profession: Dietary, dish-room, and cooks    Exercises 2 x weekly  Family History  Problem Relation Age of Onset  . Heart attack Father   . Heart attack Mother   . Diabetes Sister   . High blood pressure Brother   . Hypertension Brother   . High blood pressure Sister   . Cancer Sister   . High blood pressure Son   . Cancer Maternal Grandmother   . Heart disease Paternal Grandmother   . Pancreatic cancer Paternal Grandmother   . Asthma Other        Great paternal grandfather   . Autism Son   . Asthma Other        Neice   . Colon cancer Neg Hx   . Esophageal cancer Neg Hx   . Rectal cancer Neg Hx   . Stomach cancer Neg Hx     Allergies  Allergen Reactions  . Ace Inhibitors Other (See Comments)    Angioedema  . Aspirin Anaphylaxis, Swelling and Other (See Comments)     Throat swelling   . Ibuprofen Anaphylaxis, Swelling and Other (See Comments)    Throat swelling  . Nsaids Anaphylaxis, Swelling and Other (See Comments)  . Lipitor [Atorvastatin] Swelling and Other (See Comments)    Face, feet swell    Outpatient Encounter Medications as of 09/09/2017  Medication Sig  . acetaminophen (TYLENOL) 325 MG tablet Take 2 tablets (650 mg total) by mouth every 6 (six) hours as needed.  Marland Kitchen albuterol (PROVENTIL HFA;VENTOLIN HFA) 108 (90 Base) MCG/ACT inhaler Inhale 2 puffs into the lungs every 6 (six) hours as needed for wheezing or shortness of breath.  Marland Kitchen amLODipine (NORVASC) 10 MG tablet TAKE 1 TABLET(10 MG) BY MOUTH DAILY FOR HIGH BLOOD PRESSURE  . bisoprolol (ZEBETA) 5 MG tablet TAKE 1/2 TABLET BY MOUTH EVERY DAY FOR HIGH BLOOD PRESSURE  . cetirizine (ZYRTEC) 10 MG tablet TAKE 1 TABLET(10 MG) BY MOUTH DAILY  . citalopram (CELEXA) 20 MG tablet Take 1 tablet by mouth daily.  . fluticasone (FLONASE) 50 MCG/ACT nasal spray Place 2 sprays into both nostrils daily.  . Fluticasone-Salmeterol (ADVAIR DISKUS) 250-50 MCG/DOSE AEPB Inhale 1 puff into the lungs 2 (two) times daily.  . hydrALAZINE (APRESOLINE) 25 MG tablet TAKE 1 TABLET(25 MG) BY MOUTH EVERY 8 HOURS  . montelukast (SINGULAIR) 10 MG tablet TAKE 1 TABLET(10 MG) BY MOUTH AT BEDTIME  . [DISCONTINUED] diphenhydrAMINE (BENADRYL) 25 MG tablet Take 1 tablet (25 mg total) by mouth every 6 (six) hours.  . [DISCONTINUED] metoprolol (LOPRESSOR) 50 MG tablet Take 50 mg by mouth 2 (two) times daily.  . [DISCONTINUED] montelukast (SINGULAIR) 10 MG tablet TAKE 1 TABLET(10 MG) BY MOUTH AT BEDTIME   No facility-administered encounter medications on file as of 09/09/2017.     Review of Systems:  Review of Systems  Musculoskeletal: Positive for neck pain.  Neurological: Positive for numbness and headaches.  Psychiatric/Behavioral: The patient is nervous/anxious.   All other systems reviewed and are negative.   Health  Maintenance  Topic Date Due  . TETANUS/TDAP  11/18/1977  . PAP SMEAR  06/23/2017  . MAMMOGRAM  09/24/2017 (Originally 11/18/2008)  . Hepatitis C Screening  08/05/2018 (Originally [birth year])  . COLONOSCOPY  06/19/2020  . INFLUENZA VACCINE  Completed  . HIV Screening  Completed    Physical Exam: Vitals:   09/09/17 1002  BP: 130/70  Pulse: 75  Resp: 20  Temp: (!) 97.4 F (36.3 C)  SpO2: 93%  Weight: 150 lb 6.4 oz (68.2 kg)  Height: '4\' 11"'  (1.499 m)   Body  mass index is 30.38 kg/m. Physical Exam  Constitutional: She is oriented to person, place, and time. She appears well-developed and well-nourished.  HENT:  Mouth/Throat: Oropharynx is clear and moist. No oropharyngeal exudate.  MMM; no oral thrush  Eyes: Pupils are equal, round, and reactive to light. No scleral icterus.  Neck: Neck supple. Muscular tenderness present. Carotid bruit is not present. Decreased range of motion present. No tracheal deviation present. No thyromegaly present.    Cardiovascular: Normal rate, regular rhythm and intact distal pulses. Exam reveals no gallop and no friction rub.  Murmur (1/6 blowing murmur) heard. No LE edema b/l. no calf TTP.   Pulmonary/Chest: Effort normal and breath sounds normal. No stridor. No respiratory distress. She has no wheezes. She has no rales.  Abdominal: Soft. Normal appearance and bowel sounds are normal. She exhibits no distension and no mass. There is no hepatomegaly. There is tenderness (LUQ). There is no rigidity, no rebound and no guarding. No hernia.  obese  Musculoskeletal: She exhibits edema.  Lymphadenopathy:    She has no cervical adenopathy.  Neurological: She is alert and oriented to person, place, and time.  Skin: Skin is warm and dry. No rash noted.  Psychiatric: She has a normal mood and affect. Her behavior is normal. Judgment and thought content normal.    Labs reviewed: Basic Metabolic Panel: Recent Labs    02/10/17 0816  NA 142  K 3.6  CL  105  CO2 21  GLUCOSE 104*  BUN 11  CREATININE 0.85  CALCIUM 9.5  TSH 1.57   Liver Function Tests: Recent Labs    02/10/17 0816  AST 18  ALT 14  ALKPHOS 139*  BILITOT 0.6  PROT 7.8  ALBUMIN 4.0   No results for input(s): LIPASE, AMYLASE in the last 8760 hours. No results for input(s): AMMONIA in the last 8760 hours. CBC: Recent Labs    02/10/17 0816  WBC 6.6  NEUTROABS 3,168  HGB 13.3  HCT 40.4  MCV 82.6  PLT 283   Lipid Panel: Recent Labs    02/10/17 0816  CHOL 156  HDL 44*  LDLCALC 90  TRIG 112  CHOLHDL 3.5   No results found for: HGBA1C  Procedures since last visit: No results found.  Assessment/Plan     ICD-10-CM   1. Neck pain on left side M54.2 DG Cervical Spine Complete  2. Adenomatous polyp of colon, unspecified part of colon D12.6   3. Essential hypertension I10 CMP with eGFR([facility])  4. Moderate persistent asthma without complication W80.88   5. Generalized anxiety disorder F41.1 CMP with eGFR([facility])  6. High risk medication use Z79.899 CMP with eGFR([facility])   Continue current medications as ordered  May take Tylenol ES for pain as needed  AVOID NSAIDS DUE TO ALLERGY  Will call with xray results  Will call with lab results  Follow up in 6 mos for AWV/CPE.    Titianna Loomis S. Perlie Gold  [facility] [address] [place], [place] [address] [phone] Cell (Monday-Friday 8 AM - 5 PM) [phone] After 5 PM and follow prompts

## 2017-09-21 ENCOUNTER — Other Ambulatory Visit: Payer: Self-pay | Admitting: Nurse Practitioner

## 2017-09-21 DIAGNOSIS — J4540 Moderate persistent asthma, uncomplicated: Secondary | ICD-10-CM

## 2017-09-21 DIAGNOSIS — F411 Generalized anxiety disorder: Secondary | ICD-10-CM

## 2017-10-11 ENCOUNTER — Other Ambulatory Visit: Payer: Self-pay | Admitting: Internal Medicine

## 2017-10-11 DIAGNOSIS — F411 Generalized anxiety disorder: Secondary | ICD-10-CM

## 2017-10-11 DIAGNOSIS — J4540 Moderate persistent asthma, uncomplicated: Secondary | ICD-10-CM

## 2017-10-13 ENCOUNTER — Other Ambulatory Visit: Payer: Self-pay | Admitting: Internal Medicine

## 2017-10-13 DIAGNOSIS — I10 Essential (primary) hypertension: Secondary | ICD-10-CM

## 2018-02-10 ENCOUNTER — Other Ambulatory Visit: Payer: Self-pay | Admitting: Internal Medicine

## 2018-02-10 DIAGNOSIS — J4542 Moderate persistent asthma with status asthmaticus: Secondary | ICD-10-CM

## 2018-03-09 ENCOUNTER — Other Ambulatory Visit: Payer: Self-pay | Admitting: Internal Medicine

## 2018-03-09 DIAGNOSIS — I10 Essential (primary) hypertension: Secondary | ICD-10-CM

## 2018-03-10 ENCOUNTER — Ambulatory Visit (INDEPENDENT_AMBULATORY_CARE_PROVIDER_SITE_OTHER): Payer: Medicare HMO

## 2018-03-10 ENCOUNTER — Ambulatory Visit (INDEPENDENT_AMBULATORY_CARE_PROVIDER_SITE_OTHER): Payer: Medicare HMO | Admitting: Internal Medicine

## 2018-03-10 ENCOUNTER — Encounter: Payer: Self-pay | Admitting: Internal Medicine

## 2018-03-10 VITALS — BP 146/69 | HR 90 | Temp 97.8°F | Ht 59.0 in | Wt 152.0 lb

## 2018-03-10 DIAGNOSIS — L2082 Flexural eczema: Secondary | ICD-10-CM | POA: Diagnosis not present

## 2018-03-10 DIAGNOSIS — Z0000 Encounter for general adult medical examination without abnormal findings: Secondary | ICD-10-CM

## 2018-03-10 DIAGNOSIS — M542 Cervicalgia: Secondary | ICD-10-CM

## 2018-03-10 DIAGNOSIS — Z1231 Encounter for screening mammogram for malignant neoplasm of breast: Secondary | ICD-10-CM | POA: Diagnosis not present

## 2018-03-10 DIAGNOSIS — I10 Essential (primary) hypertension: Secondary | ICD-10-CM

## 2018-03-10 DIAGNOSIS — Z79899 Other long term (current) drug therapy: Secondary | ICD-10-CM

## 2018-03-10 DIAGNOSIS — H01021 Squamous blepharitis right upper eyelid: Secondary | ICD-10-CM | POA: Diagnosis not present

## 2018-03-10 DIAGNOSIS — H01024 Squamous blepharitis left upper eyelid: Secondary | ICD-10-CM | POA: Diagnosis not present

## 2018-03-10 DIAGNOSIS — Z1239 Encounter for other screening for malignant neoplasm of breast: Secondary | ICD-10-CM

## 2018-03-10 DIAGNOSIS — J4540 Moderate persistent asthma, uncomplicated: Secondary | ICD-10-CM | POA: Diagnosis not present

## 2018-03-10 DIAGNOSIS — H01022 Squamous blepharitis right lower eyelid: Secondary | ICD-10-CM | POA: Diagnosis not present

## 2018-03-10 DIAGNOSIS — F411 Generalized anxiety disorder: Secondary | ICD-10-CM | POA: Diagnosis not present

## 2018-03-10 DIAGNOSIS — H40013 Open angle with borderline findings, low risk, bilateral: Secondary | ICD-10-CM | POA: Diagnosis not present

## 2018-03-10 DIAGNOSIS — I351 Nonrheumatic aortic (valve) insufficiency: Secondary | ICD-10-CM

## 2018-03-10 DIAGNOSIS — H01025 Squamous blepharitis left lower eyelid: Secondary | ICD-10-CM | POA: Diagnosis not present

## 2018-03-10 DIAGNOSIS — H6121 Impacted cerumen, right ear: Secondary | ICD-10-CM | POA: Diagnosis not present

## 2018-03-10 DIAGNOSIS — H25813 Combined forms of age-related cataract, bilateral: Secondary | ICD-10-CM | POA: Diagnosis not present

## 2018-03-10 LAB — COMPLETE METABOLIC PANEL WITH GFR
AG RATIO: 1.3 (calc) (ref 1.0–2.5)
ALT: 18 U/L (ref 6–29)
AST: 17 U/L (ref 10–35)
Albumin: 4 g/dL (ref 3.6–5.1)
Alkaline phosphatase (APISO): 118 U/L (ref 33–130)
BUN: 8 mg/dL (ref 7–25)
CO2: 28 mmol/L (ref 20–32)
CREATININE: 0.91 mg/dL (ref 0.50–1.05)
Calcium: 9.2 mg/dL (ref 8.6–10.4)
Chloride: 106 mmol/L (ref 98–110)
GFR, EST NON AFRICAN AMERICAN: 69 mL/min/{1.73_m2} (ref >=60)
GFR, Est African American: 80 mL/min/{1.73_m2} (ref >=60)
GLOBULIN: 3.1 g/dL (ref 1.9–3.7)
Glucose, Bld: 103 mg/dL — ABNORMAL HIGH (ref 65–99)
Potassium: 3.2 mmol/L — ABNORMAL LOW (ref 3.5–5.3)
SODIUM: 143 mmol/L (ref 135–146)
Total Bilirubin: 0.5 mg/dL (ref 0.2–1.2)
Total Protein: 7.1 g/dL (ref 6.1–8.1)

## 2018-03-10 LAB — LIPID PANEL
CHOL/HDL RATIO: 3.7 (calc) (ref <5.0)
Cholesterol: 154 mg/dL (ref <200)
HDL: 42 mg/dL — AB (ref >50)
LDL Cholesterol (Calc): 83 mg/dL (calc)
NON-HDL CHOLESTEROL (CALC): 112 mg/dL (ref <130)
Triglycerides: 191 mg/dL — ABNORMAL HIGH (ref <150)

## 2018-03-10 LAB — CBC WITH DIFFERENTIAL/PLATELET
Basophils Absolute: 69 cells/uL (ref 0–200)
Basophils Relative: 1 %
EOS PCT: 9.2 %
Eosinophils Absolute: 635 cells/uL — ABNORMAL HIGH (ref 15–500)
HEMATOCRIT: 38.1 % (ref 35.0–45.0)
Hemoglobin: 12.7 g/dL (ref 11.7–15.5)
LYMPHS ABS: 1891 {cells}/uL (ref 850–3900)
MCH: 26.9 pg — ABNORMAL LOW (ref 27.0–33.0)
MCHC: 33.3 g/dL (ref 32.0–36.0)
MCV: 80.7 fL (ref 80.0–100.0)
MPV: 10.2 fL (ref 7.5–12.5)
Monocytes Relative: 10 %
NEUTROS ABS: 3616 {cells}/uL (ref 1500–7800)
Neutrophils Relative %: 52.4 %
Platelets: 276 10*3/uL (ref 140–400)
RBC: 4.72 10*6/uL (ref 3.80–5.10)
RDW: 13.1 % (ref 11.0–15.0)
Total Lymphocyte: 27.4 %
WBC: 6.9 10*3/uL (ref 3.8–10.8)
WBCMIX: 690 {cells}/uL (ref 200–950)

## 2018-03-10 LAB — TSH: TSH: 1.88 m[IU]/L (ref 0.40–4.50)

## 2018-03-10 MED ORDER — TRIAMCINOLONE ACETONIDE 0.025 % EX OINT: 1.0000 "application " | TOPICAL_OINTMENT | Freq: Two times a day (BID) | CUTANEOUS | 1 refills | Status: DC

## 2018-03-10 NOTE — Addendum Note (Signed)
Addended by: Tyson Dense E on: 03/10/2018 09:48 AM   Modules accepted: Orders

## 2018-03-10 NOTE — Progress Notes (Addendum)
Subjective:   Tammie [name] is a 57 y.o. female who presents for Medicare Annual (Subsequent) preventive examination.  Last AWV-02/07/2017    Objective:     Vitals: BP (!) 146/69 (BP Location: Left Arm, Patient Position: Sitting)   Pulse 90   Temp 97.8 F (36.6 C) (Oral)   Ht 4\' 11"  (1.499 m)   Wt 152 lb (68.9 kg)   SpO2 95%   BMI 30.70 kg/m   Body mass index is 30.7 kg/m.  Provider notified of BP  Advanced Directives 03/10/2018 06/19/2017 06/05/2017 02/12/2017 02/07/2017 09/25/2016 02/07/2016  Does Patient Have a Medical Advance Directive? No No No No No No No  Would patient like information on creating a medical advance directive? Yes (MAU/Ambulatory/Procedural Areas - Information given) No - Patient declined - Yes (MAU/Ambulatory/Procedural Areas - Information given) Yes (MAU/Ambulatory/Procedural Areas - Information given) No - Patient declined No - patient declined information    Tobacco Social History   Tobacco Use  Smoking Status Never Smoker  Smokeless Tobacco Never Used     Counseling given: Not Answered   Clinical Intake:  Pre-visit preparation completed: No  Pain : 0-10 Pain Score: 6  Pain Type: Chronic pain Pain Location: Neck Pain Orientation: Posterior Pain Descriptors / Indicators: Aching Pain Onset: More than a month ago Pain Frequency: Intermittent     Diabetes: No  What is the last grade level you completed in school?: High school  Interpreter Needed?: No  Information entered by :: Tyson Dense, RN  Past Medical History:  Diagnosis Date  . Allergy   . Anemia   . Anxiety   . Asthma   . Environmental allergies   . GERD (gastroesophageal reflux disease)    Patient denies  . Headache(784.0)   . Hypertension   . Respiratory failure, acute ([place]) 04/19/2014  . Shortness of breath    Past Surgical History:  Procedure Laterality Date  . ABDOMINAL HYSTERECTOMY  1993   [facility]   . NASAL SINUS SURGERY     Polyps   Family History    Problem Relation Age of Onset  . Heart attack Father   . Heart attack Mother   . Diabetes Sister   . High blood pressure Brother   . Hypertension Brother   . High blood pressure Sister   . Cancer Sister   . High blood pressure Son   . Cancer Maternal Grandmother   . Heart disease Paternal Grandmother   . Pancreatic cancer Paternal Grandmother   . Asthma Other        Great paternal grandfather   . Autism Son   . Asthma Other        Neice   . Colon cancer Neg Hx   . Esophageal cancer Neg Hx   . Rectal cancer Neg Hx   . Stomach cancer Neg Hx    Social History   Socioeconomic History  . Marital status: Single    Spouse name: Not on file  . Number of children: Not on file  . Years of education: Not on file  . Highest education level: Not on file  Occupational History  . Not on file  Social Needs  . Financial resource strain: Not hard at all  . Food insecurity:    Worry: Never true    Inability: Never true  . Transportation needs:    Medical: No    Non-medical: No  Tobacco Use  . Smoking status: Never Smoker  . Smokeless tobacco: Never Used  Substance and Sexual Activity  . Alcohol use: No  . Drug use: No  . Sexual activity: Never  Lifestyle  . Physical activity:    Days per week: 0 days    Minutes per session: 0 min  . Stress: Only a little  Relationships  . Social connections:    Talks on phone: More than three times a week    Gets together: More than three times a week    Attends religious service: More than 4 times per year    Active member of club or organization: No    Attends meetings of clubs or organizations: Never    Relationship status: Never married  Other Topics Concern  . Not on file  Social History Narrative   Diet-Salt Free   Drinks [employer]    Lives in an apartment, one stories, one person in home, no pets   Current/past profession: Dietary, dish-room, and cooks    Exercises 2 x weekly                Outpatient Encounter  Medications as of 03/10/2018  Medication Sig  . acetaminophen (TYLENOL) 325 MG tablet Take 2 tablets (650 mg total) by mouth every 6 (six) hours as needed.  Marland Kitchen ADVAIR DISKUS 250-50 MCG/DOSE AEPB INHALE 1 PUFF INTO THE LUNGS TWICE DAILY  . amLODipine (NORVASC) 10 MG tablet TAKE 1 TABLET(10 MG) BY MOUTH DAILY FOR HIGH BLOOD PRESSURE  . bisoprolol (ZEBETA) 5 MG tablet TAKE 1/2 TABLET BY MOUTH EVERY DAY FOR HIGH BLOOD PRESSURE  . cetirizine (ZYRTEC) 10 MG tablet TAKE 1 TABLET(10 MG) BY MOUTH DAILY  . citalopram (CELEXA) 20 MG tablet TAKE 1 TABLET(20 MG) BY MOUTH DAILY  . fluticasone (FLONASE) 50 MCG/ACT nasal spray Place 2 sprays into both nostrils daily.  . hydrALAZINE (APRESOLINE) 25 MG tablet TAKE 1 TABLET(25 MG) BY MOUTH EVERY 8 HOURS  . montelukast (SINGULAIR) 10 MG tablet TAKE 1 TABLET(10 MG) BY MOUTH AT BEDTIME  . PROVENTIL HFA 108 (90 Base) MCG/ACT inhaler INHALE 2 PUFFS INTO THE LUNGS EVERY 6 HOURS AS NEEDED FOR WHEEZING OR SHORTNESS OF BREATH  . [DISCONTINUED] diphenhydrAMINE (BENADRYL) 25 MG tablet Take 1 tablet (25 mg total) by mouth every 6 (six) hours.  . [DISCONTINUED] metoprolol (LOPRESSOR) 50 MG tablet Take 50 mg by mouth 2 (two) times daily.   No facility-administered encounter medications on file as of 03/10/2018.     Activities of Daily Living In your present state of health, do you have any difficulty performing the following activities: 03/10/2018  Hearing? N  Vision? N  Difficulty concentrating or making decisions? N  Walking or climbing stairs? N  Dressing or bathing? N  Doing errands, shopping? N  Preparing Food and eating ? N  Using the Toilet? N  In the past six months, have you accidently leaked urine? Y  Comment seldom  Do you have problems with loss of bowel control? N  Managing your Medications? N  Managing your Finances? N  Housekeeping or managing your Housekeeping? N  Some recent data might be hidden    Patient Care Team: Gildardo Cranker, DO as PCP -  General (Internal Medicine)    Assessment:   This is a routine wellness examination for Tammie [name].  Exercise Activities and Dietary recommendations Current Exercise Habits: The patient does not participate in regular exercise at present, Exercise limited by: None identified  Goals    . Exercise 3x per week (30 min per time)     Patient would  like to start exercising 3 days a week.       Fall Risk Fall Risk  03/10/2018 06/06/2017 02/12/2017 02/07/2017 09/25/2016  Falls in the past year? No No No Yes No  Number falls in past yr: - - - 1 -  Injury with Fall? - - - Yes -  Comment - - - bruised kneecap -   Is the patient's home free of loose throw rugs in walkways, pet beds, electrical cords, etc?   yes      Grab bars in the bathroom? no      Handrails on the stairs?   yes      Adequate lighting?   yes  Depression Screen PHQ 2/9 Scores 03/10/2018 02/12/2017 02/07/2017 02/07/2016  PHQ - 2 Score 0 0 0 0     Cognitive Function within normal function MMSE - Mini Mental State Exam 02/07/2017  Not completed: (No Data)        Immunization History  Administered Date(s) Administered  . Influenza,inj,Quad PF,6+ Mos 05/26/2014, 06/21/2016, 05/27/2017  . Influenza-Unspecified 05/06/2015, 05/23/2017  . Pneumococcal Conjugate-13 12/17/2016  . Tdap 07/22/2017  . Zoster Recombinat (Shingrix) 12/17/2016    Qualifies for Shingles Vaccine? Yes, due for secondshot  Screening Tests Health Maintenance  Topic Date Due  . TETANUS/TDAP  11/18/1977  . MAMMOGRAM  11/18/2008  . PAP SMEAR  06/23/2017  . INFLUENZA VACCINE  03/05/2018  . Hepatitis C Screening  08/05/2018 (Originally [birth year])  . COLONOSCOPY  06/19/2020  . HIV Screening  Completed    Cancer Screenings: Lung: Low Dose CT Chest recommended if Age 77-80 years, 30 pack-year currently smoking OR have quit w/in 15years. Patient does not qualify. Breast:  Up to date on Mammogram? No, ordered Up to date of Bone Density/Dexa? Due at age  23 Colorectal: up to date  Additional Screenings:  Hepatitis C Screening: declined TDAP due-ordered     Plan:    I have personally reviewed and addressed the Medicare Annual Wellness questionnaire and have noted the following in the patient's chart:  A. Medical and social history B. Use of alcohol, tobacco or illicit drugs  C. Current medications and supplements D. Functional ability and status E.  Nutritional status F.  Physical activity G. Advance directives H. List of other physicians I.  Hospitalizations, surgeries, and ER visits in previous 12 months J.  [place] to include hearing, vision, cognitive, depression L. Referrals and appointments - none  In addition, I have reviewed and discussed with patient certain preventive protocols, quality metrics, and best practice recommendations. A written personalized care plan for preventive services as well as general preventive health recommendations were provided to patient.  See attached scanned questionnaire for additional information.   Signed,   Tyson Dense, RN Nurse Health Advisor  Patient Concerns: L ankle dry, itchy spots. Having trouble breathing when sleeping and when throat is dry

## 2018-03-10 NOTE — Patient Instructions (Addendum)
RIGHT EAR LAVAGE DONE TODAY  START TRIAMCINOLONE OINTMENT TO RASH ON FOOT - APPLY 2 TIMES DAILY X 2 WEEKS OR UNTIL RASH GONE  Continue other medications as ordered  WILL CALL WITH XRAY OF NECK RESULTS  Will call with lab results  Follow up in 6 mos with Janett Billow for GAD, AI, asthma, HTN  Keeping You Healthy  Get These Tests  Blood Pressure- Have your blood pressure checked by your healthcare provider at least once a year.  Normal blood pressure is 120/80.  Weight- Have your body mass index (BMI) calculated to screen for obesity.  BMI is a measure of body fat based on height and weight.  You can calculate your own BMI at [employer]  Cholesterol- Have your cholesterol checked every year.  Diabetes- Have your blood sugar checked every year if you have high blood pressure, high cholesterol, a family history of diabetes or if you are overweight.  Pap Test - Have a pap test every 1 to 5 years if you have been sexually active.  If you are older than 65 and recent pap tests have been normal you may not need additional pap tests.  In addition, if you have had a hysterectomy  for benign disease additional pap tests are not necessary.  Mammogram-Yearly mammograms are essential for early detection of breast cancer  Screening for Colon Cancer- Colonoscopy starting at age 29. Screening may begin sooner depending on your family history and other health conditions.  Follow up colonoscopy as directed by your Gastroenterologist.  Screening for Osteoporosis- Screening begins at age 87 with bone density scanning, sooner if you are at higher risk for developing Osteoporosis.  Get these medicines  Calcium with Vitamin D- Your body requires 1200-1500 mg of Calcium a day and [phone] IU of Vitamin D a day.  You can only absorb 500 mg of Calcium at a time therefore Calcium must be taken in 2 or 3 separate doses throughout the day.  Hormones- Hormone therapy has been associated with increased  risk for certain cancers and heart disease.  Talk to your healthcare provider about if you need relief from menopausal symptoms.  Aspirin- Ask your healthcare provider about taking Aspirin to prevent Heart Disease and Stroke.  Get these Immuniztions  Flu shot- Every fall  Pneumonia shot- Once after the age of 74; if you are younger ask your healthcare provider if you need a pneumonia shot.  Tetanus- Every ten years.  Zostavax- Once after the age of 89 to prevent shingles.  Take these steps  Don't smoke- Your healthcare provider can help you quit. For tips on how to quit, ask your healthcare provider or go to www.smokefree.gov or call 1-800 QUIT-NOW.  Be physically active- Exercise 5 days a week for a minimum of 30 minutes.  If you are not already physically active, start slow and gradually work up to 30 minutes of moderate physical activity.  Try walking, dancing, bike riding, swimming, etc.  Eat a healthy diet- Eat a variety of healthy foods such as fruits, vegetables, whole grains, low fat milk, low fat cheeses, yogurt, lean meats, chicken, fish, eggs, dried beans, tofu, etc.  For more information go to www.thenutritionsource.org  Dental visit- Brush and floss teeth twice daily; visit your dentist twice a year.  Eye exam- Visit your Optometrist or Ophthalmologist yearly.  Drink alcohol in moderation- Limit alcohol intake to one drink or less a day.  Never drink and drive.  Depression- Your emotional health is as important as your  physical health.  If you're feeling down or losing interest in things you normally enjoy, please talk to your healthcare provider.  Seat Belts- can save your life; always wear one  Smoke/Carbon Monoxide detectors- These detectors need to be installed on the appropriate level of your home.  Replace batteries at least once a year.  Violence- If anyone is threatening or hurting you, please tell your healthcare provider.  Living Will/ Health care power of  attorney- Discuss with your healthcare provider and family.

## 2018-03-10 NOTE — Progress Notes (Signed)
Patient ID: Tammie [name], female   DOB: 1961-04-20, 57 y.o.   MRN: [id number]   Location:  [facility]  Place of Service:  OFFICE  Provider: Arletha Grippe, DO  Patient Care Team: Gildardo Cranker, DO as PCP - General (Internal Medicine)  Extended Emergency Contact Information Primary Emergency Contact: Bey,Shirley Address: [address]          [place] [address] [place] Phone: [phone] Relation: Sister Secondary Emergency Contact: [place] Phone: [phone] Relation: Brother  Code Status:  Goals of Care: Advanced Directive information Advanced Directives 03/10/2018  Does Patient Have a Medical Advance Directive? No  Would patient like information on creating a medical advance directive? Yes (MAU/Ambulatory/Procedural Areas - Information given)     Chief Complaint  Patient presents with  . Annual Exam    Yearly check-up, AWV completed today, -depression, -fall    HPI: Patient is a 57 y.o. female seen in today for a comprehensive exam.  She completed AWV this AM.  She continues to have pain in left neck as previously stated in Feb 2019 ov ("constant left neck pain that improves with Tylenol.  No known injury. No dizziness but has occasional HA. 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 She has intermittent needle-like tingling/numbness in finger tips. She has never had imaging studies. She has occasional hot flashes".). She never went for C spine xray that was ordered.  She had colonoscopy in 06/2017 - several precancerous polyps (tubular adenomas) removed and some hyperplastic polyps. She is due for repeat colonoscopy in 2021  Anxiety - mood stable on citalopram. No panic attacks. She failed GDR  Asthma moderate persistent/allergic rhinitis - She uses albuterol HFA daily and takes singulair and zyrtec. She uses flonase daily.  No  recent asthma exacerbation    HTN - BP controlled on amlodipine, hydralazine and bisoprolol  Hx Nasal mass vs polyps - she is s/p removal in May 2017 by Dr Redmond Baseman, ENT. She has rhinorrhea  Heart murmur - 2D echo in Aug 2017 revealed EF 55-60%;  Grade 1 DD; mild AI, MR, TR ;mild pulm HTN (32 mm Hg)       Depression screen [facility] 2/9 03/10/2018 02/12/2017 02/07/2017 02/07/2016 06/23/2014  Decreased Interest 0 0 0 0 0  Down, Depressed, Hopeless 0 0 0 0 0  PHQ - 2 Score 0 0 0 0 0    Fall Risk  03/10/2018 06/06/2017 02/12/2017 02/07/2017 09/25/2016  Falls in the past year? No No No Yes No  Number falls in past yr: - - - 1 -  Injury with Fall? - - - Yes -  Comment - - - bruised kneecap -   MMSE - Mini Mental State Exam 02/07/2017  Not completed: (No Data)     Health Maintenance  Topic Date Due  . TETANUS/TDAP  11/18/1977  . MAMMOGRAM  11/18/2008  . PAP SMEAR  06/23/2017  . INFLUENZA VACCINE  03/05/2018  . Hepatitis C Screening  08/05/2018 (Originally May 05, 1961)  . COLONOSCOPY  06/19/2020  . HIV Screening  Completed    Past Medical History:  Diagnosis Date  . Allergy   . Anemia   . Anxiety   . Asthma   . Environmental allergies   . GERD (gastroesophageal reflux disease)    Patient denies  . Headache(784.0)   . Hypertension   . Respiratory failure, acute ([place]) 04/19/2014  .  Shortness of breath     Past Surgical History:  Procedure Laterality Date  . ABDOMINAL HYSTERECTOMY  1993   [facility]   . NASAL SINUS SURGERY     Polyps    Family History  Problem Relation Age of Onset  . Heart attack Father   . Heart attack Mother   . Diabetes Sister   . High blood pressure Brother   . Hypertension Brother   . High blood pressure Sister   . Cancer Sister   . High blood pressure Son   . Cancer Maternal Grandmother   . Heart disease Paternal Grandmother   . Pancreatic cancer Paternal Grandmother   . Asthma Other        Great paternal grandfather   . Autism Son   . Asthma  Other        Neice   . Colon cancer Neg Hx   . Esophageal cancer Neg Hx   . Rectal cancer Neg Hx   . Stomach cancer Neg Hx    Family Status  Relation Name Status  . Father  Deceased at age 36       heart disease  . Mother  Deceased at age 80       heart disease, dementia  . Sister [employer]  . Brother [employer]  . Sister [employer]  . Son Hoover Browns  (Not Specified)  . [employer]GM  (Not Specified)  . PGM  (Not Specified)  . Other  (Not Specified)  . Son  (Not Specified)  . Other  (Not Specified)  . Neg Hx  (Not Specified)    Social History   Socioeconomic History  . Marital status: Single    Spouse name: Not on file  . Number of children: Not on file  . Years of education: Not on file  . Highest education level: Not on file  Occupational History  . Not on file  Social Needs  . Financial resource strain: Not hard at all  . Food insecurity:    Worry: Never true    Inability: Never true  . Transportation needs:    Medical: No    Non-medical: No  Tobacco Use  . Smoking status: Never Smoker  . Smokeless tobacco: Never Used  Substance and Sexual Activity  . Alcohol use: No  . Drug use: No  . Sexual activity: Never  Lifestyle  . Physical activity:    Days per week: 0 days    Minutes per session: 0 min  . Stress: Only a little  Relationships  . Social connections:    Talks on phone: More than three times a week    Gets together: More than three times a week    Attends religious service: More than 4 times per year    Active member of club or organization: No    Attends meetings of clubs or organizations: Never    Relationship status: Never married  . Intimate partner violence:    Fear of current or ex partner: No    Emotionally abused: No    Physically abused: No    Forced sexual activity: No  Other Topics Concern  . Not on file  Social History Narrative   Diet-Salt Free   Drinks [employer]    Lives in an apartment, one stories, one person in home, no  pets   Current/past profession: Dietary, dish-room, and cooks    Exercises 2 x weekly  Allergies  Allergen Reactions  . Ace Inhibitors Other (See Comments)    Angioedema  . Aspirin Anaphylaxis, Swelling and Other (See Comments)    Throat swelling   . Ibuprofen Anaphylaxis, Swelling and Other (See Comments)    Throat swelling  . Nsaids Anaphylaxis, Swelling and Other (See Comments)  . Lipitor [Atorvastatin] Swelling and Other (See Comments)    Face, feet swell    Allergies as of 03/10/2018      Reactions   Ace Inhibitors Other (See Comments)   Angioedema   Aspirin Anaphylaxis, Swelling, Other (See Comments)   Throat swelling   Ibuprofen Anaphylaxis, Swelling, Other (See Comments)   Throat swelling   Nsaids Anaphylaxis, Swelling, Other (See Comments)   Lipitor [atorvastatin] Swelling, Other (See Comments)   Face, feet swell      Medication List        Accurate as of 03/10/18 10:13 AM. Always use your most recent med list.          acetaminophen 325 MG tablet Commonly known as:  TYLENOL Take 2 tablets (650 mg total) by mouth every 6 (six) hours as needed.   ADVAIR DISKUS 250-50 MCG/DOSE Aepb Generic drug:  Fluticasone-Salmeterol INHALE 1 PUFF INTO THE LUNGS TWICE DAILY   amLODipine 10 MG tablet Commonly known as:  NORVASC TAKE 1 TABLET(10 MG) BY MOUTH DAILY FOR HIGH BLOOD PRESSURE   bisoprolol 5 MG tablet Commonly known as:  ZEBETA TAKE 1/2 TABLET BY MOUTH EVERY DAY FOR HIGH BLOOD PRESSURE   cetirizine 10 MG tablet Commonly known as:  ZYRTEC TAKE 1 TABLET(10 MG) BY MOUTH DAILY   citalopram 20 MG tablet Commonly known as:  CELEXA TAKE 1 TABLET(20 MG) BY MOUTH DAILY   fluticasone 50 MCG/ACT nasal spray Commonly known as:  FLONASE Place 2 sprays into both nostrils daily.   hydrALAZINE 25 MG tablet Commonly known as:  APRESOLINE TAKE 1 TABLET(25 MG) BY MOUTH EVERY 8 HOURS   montelukast 10 MG tablet Commonly known as:  SINGULAIR TAKE 1  TABLET(10 MG) BY MOUTH AT BEDTIME   PROVENTIL HFA 108 (90 Base) MCG/ACT inhaler Generic drug:  albuterol INHALE 2 PUFFS INTO THE LUNGS EVERY 6 HOURS AS NEEDED FOR WHEEZING OR SHORTNESS OF BREATH        Review of Systems:  Review of Systems  Musculoskeletal: Positive for neck pain.  Neurological: Positive for numbness and headaches.  All other systems reviewed and are negative.   Physical Exam: Vitals:   03/10/18 0940  BP: (!) 146/69  Pulse: 90  Temp: 97.8 F (36.6 C)  TempSrc: Oral  SpO2: 95%  Weight: 152 lb (68.9 kg)  Height: '4\' 11"'  (1.499 m)   Body mass index is 30.7 kg/m. Physical Exam  Constitutional: She is oriented to person, place, and time. She appears well-developed and well-nourished. No distress.  HENT:  Head: Normocephalic and atraumatic.  Right Ear: Hearing, tympanic membrane, external ear and ear canal normal.  Left Ear: Hearing, tympanic membrane, external ear and ear canal normal.  Mouth/Throat: Uvula is midline, oropharynx is clear and moist and mucous membranes are normal. She does not have dentures.  Eyes: Pupils are equal, round, and reactive to light. Conjunctivae, EOM and lids are normal. No scleral icterus.  Neck: Trachea normal and normal range of motion. Neck supple. Carotid bruit is not present. No thyroid mass and no thyromegaly present.  Cardiovascular: Normal rate, regular rhythm and intact distal pulses. Exam reveals no gallop and no friction rub.  Murmur (1/6 diastolic)  heard. No LE edema b/l. No calf TTP.   Pulmonary/Chest: Effort normal. She has wheezes (end expiratory). She has no rhonchi. She has no rales. She exhibits no mass. Right breast exhibits no inverted nipple, no mass, no nipple discharge, no skin change and no tenderness. Left breast exhibits no inverted nipple, no mass, no nipple discharge, no skin change and no tenderness. Breasts are symmetrical.  Abdominal: Soft. Normal appearance, normal aorta and bowel sounds are normal.  She exhibits distension. She exhibits no pulsatile midline mass and no mass. There is no hepatosplenomegaly. There is no tenderness. There is no rigidity, no rebound and no guarding. No hernia.  Musculoskeletal: Normal range of motion. She exhibits edema. She exhibits no deformity.  Lymphadenopathy:       Head (right side): No posterior auricular adenopathy present.       Head (left side): No posterior auricular adenopathy present.    She has no cervical adenopathy.       Right: No supraclavicular adenopathy present.       Left: No supraclavicular adenopathy present.  Neurological: She is alert and oriented to person, place, and time. She has normal strength and normal reflexes. No cranial nerve deficit. Gait normal.  Skin: Skin is warm, dry and intact. Rash (patchy eczematous rash on dorsal left distal leg and foot) noted. Nails show no clubbing.  Psychiatric: She has a normal mood and affect. Her speech is normal and behavior is normal. Thought content normal. Cognition and memory are normal.    Labs reviewed:  Basic Metabolic Panel: Recent Labs    09/09/17 1122  NA 141  K 3.6  CL 107  CO2 32  GLUCOSE 94  BUN 7  CREATININE 0.99  CALCIUM 9.7   Liver Function Tests: Recent Labs    09/09/17 1122  AST 22  ALT 17  BILITOT 0.7  PROT 7.4   No results for input(s): LIPASE, AMYLASE in the last 8760 hours. No results for input(s): AMMONIA in the last 8760 hours. CBC: No results for input(s): WBC, NEUTROABS, HGB, HCT, MCV, PLT in the last 8760 hours. Lipid Panel: No results for input(s): CHOL, HDL, LDLCALC, TRIG, CHOLHDL, LDLDIRECT in the last 8760 hours. No results found for: HGBA1C  Procedures: No results found.  Assessment/Plan   ICD-10-CM   1. Well adult exam Z00.00 Lipid Panel  2. Neck pain on left side M54.2   3. Essential hypertension I10 EKG 12-Lead    CBC with Differential/Platelets    TSH  4. Moderate persistent asthma without complication M46.80   5.  Generalized anxiety disorder F41.1 CMP with eGFR([facility])    TSH  6. Aortic valve insufficiency, etiology of cardiac valve disease unspecified I35.1 ECHOCARDIOGRAM COMPLETE  7. High risk medication use Z79.899 CBC with Differential/Platelets    CMP with eGFR([facility])    TSH  8. Impacted cerumen of right ear H61.21 Ear Lavage  9. Flexural eczema L20.82 triamcinolone (KENALOG) 0.025 % ointment     She is s/p TAH so no pap smear for cervical cancer screening  mammogram ordered at [facility] today  RIGHT EAR LAVAGE DONE TODAY  Continue current medications as ordered  WILL CALL WITH XRAY OF NECK RESULTS  Will call with lab results  Follow up in 6 mos with Janett Billow for GAD, AI, asthma, HTN  Keeping You Healthy handout given  Cordella Register. Perlie Gold  [facility] [address] [place], [place] [address] [phone] Cell (Monday-Friday  8 AM - 5 PM) [phone] After 5 PM and follow prompts

## 2018-03-10 NOTE — Telephone Encounter (Signed)
Spoke with patient, patient checked pill bottle at home and last fill date was July 2019. I was questioning last fill date, based on our records rx was last filled in Jan 2019 #90 (1 month supply because she take every 8 hours) with 1 refill which would indicate she ran out of medication in March 2019.  Patient states she does not understand why our records show that for she has taken medication continually.  RX filled

## 2018-03-10 NOTE — Patient Instructions (Addendum)
Tammie [name] , Thank you for taking time to come for your Medicare Wellness Visit. I appreciate your ongoing commitment to your health goals. Please review the following plan we discussed and let me know if I can assist you in the future.   Screening recommendations/referrals: Colonoscopy up to date, due 06/20/2027 Mammogram due, ordered Bone Density due at age 57 Recommended yearly ophthalmology/optometry visit for glaucoma screening and checkup Recommended yearly dental visit for hygiene and checkup  Vaccinations: Influenza vaccine up to date, due 2019 fall season Pneumococcal vaccine due at age 60 Tdap vaccine due, ordered Shingles vaccine due, please go to pharmacy and get second shot  Advanced directives: Advance directive discussed with you today. I have provided a copy for you to complete at home and have notarized. Once this is complete please bring a copy in to our office so we can scan it into your chart.  Conditions/risks identified: none  Next appointment: Tyson Dense, RN 03/12/2019 @ 9:15am  Preventive Care 40-64 Years, Female Preventive care refers to lifestyle choices and visits with your health care provider that can promote health and wellness. What does preventive care include?  A yearly physical exam. This is also called an annual well check.  Dental exams once or twice a year.  Routine eye exams. Ask your health care provider how often you should have your eyes checked.  Personal lifestyle choices, including:  Daily care of your teeth and gums.  Regular physical activity.  Eating a healthy diet.  Avoiding tobacco and drug use.  Limiting alcohol use.  Practicing safe sex.  Taking low-dose aspirin daily starting at age 35.  Taking vitamin and mineral supplements as recommended by your health care provider. What happens during an annual well check? The services and screenings done by your health care provider during your annual well check will depend  on your age, overall health, lifestyle risk factors, and family history of disease. Counseling  Your health care provider may ask you questions about your:  Alcohol use.  Tobacco use.  Drug use.  Emotional well-being.  Home and relationship well-being.  Sexual activity.  Eating habits.  Work and work Statistician.  Method of birth control.  Menstrual cycle.  Pregnancy history. Screening  You may have the following tests or measurements:  Height, weight, and BMI.  Blood pressure.  Lipid and cholesterol levels. These may be checked every 5 years, or more frequently if you are over 104 years old.  Skin check.  Lung cancer screening. You may have this screening every year starting at age 51 if you have a 30-pack-year history of smoking and currently smoke or have quit within the past 15 years.  Fecal occult blood test (FOBT) of the stool. You may have this test every year starting at age 60.  Flexible sigmoidoscopy or colonoscopy. You may have a sigmoidoscopy every 5 years or a colonoscopy every 10 years starting at age 30.  Hepatitis C blood test.  Hepatitis B blood test.  Sexually transmitted disease (STD) testing.  Diabetes screening. This is done by checking your blood sugar (glucose) after you have not eaten for a while (fasting). You may have this done every 1-3 years.  Mammogram. This may be done every 1-2 years. Talk to your health care provider about when you should start having regular mammograms. This may depend on whether you have a family history of breast cancer.  BRCA-related cancer screening. This may be done if you have a family history of breast, ovarian,  tubal, or peritoneal cancers.  Pelvic exam and Pap test. This may be done every 3 years starting at age 18. Starting at age 47, this may be done every 5 years if you have a Pap test in combination with an HPV test.  Bone density scan. This is done to screen for osteoporosis. You may have this scan  if you are at high risk for osteoporosis. Discuss your test results, treatment options, and if necessary, the need for more tests with your health care provider. Vaccines  Your health care provider may recommend certain vaccines, such as:  Influenza vaccine. This is recommended every year.  Tetanus, diphtheria, and acellular pertussis (Tdap, Td) vaccine. You may need a Td booster every 10 years.  Zoster vaccine. You may need this after age 50.  Pneumococcal 13-valent conjugate (PCV13) vaccine. You may need this if you have certain conditions and were not previously vaccinated.  Pneumococcal polysaccharide (PPSV23) vaccine. You may need one or two doses if you smoke cigarettes or if you have certain conditions. Talk to your health care provider about which screenings and vaccines you need and how often you need them. This information is not intended to replace advice given to you by your health care provider. Make sure you discuss any questions you have with your health care provider. Document Released: 08/18/2015 Document Revised: 04/10/2016 Document Reviewed: 05/23/2015 Elsevier Interactive Patient Education  2017 [place] Prevention in the Home Falls can cause injuries. They can happen to people of all ages. There are many things you can do to make your home safe and to help prevent falls. What can I do on the outside of my home?  Regularly fix the edges of walkways and driveways and fix any cracks.  Remove anything that might make you trip as you walk through a door, such as a raised step or threshold.  Trim any bushes or trees on the path to your home.  Use bright outdoor lighting.  Clear any walking paths of anything that might make someone trip, such as rocks or tools.  Regularly check to see if handrails are loose or broken. Make sure that both sides of any steps have handrails.  Any raised decks and porches should have guardrails on the edges.  Have any  leaves, snow, or ice cleared regularly.  Use sand or salt on walking paths during winter.  Clean up any spills in your garage right away. This includes oil or grease spills. What can I do in the bathroom?  Use night lights.  Install grab bars by the toilet and in the tub and shower. Do not use towel bars as grab bars.  Use non-skid mats or decals in the tub or shower.  If you need to sit down in the shower, use a plastic, non-slip stool.  Keep the floor dry. Clean up any water that spills on the floor as soon as it happens.  Remove soap buildup in the tub or shower regularly.  Attach bath mats securely with double-sided non-slip rug tape.  Do not have throw rugs and other things on the floor that can make you trip. What can I do in the bedroom?  Use night lights.  Make sure that you have a light by your bed that is easy to reach.  Do not use any sheets or blankets that are too big for your bed. They should not hang down onto the floor.  Have a firm chair that has side arms.  You can use this for support while you get dressed.  Do not have throw rugs and other things on the floor that can make you trip. What can I do in the kitchen?  Clean up any spills right away.  Avoid walking on wet floors.  Keep items that you use a lot in easy-to-reach places.  If you need to reach something above you, use a strong step stool that has a grab bar.  Keep electrical cords out of the way.  Do not use floor polish or wax that makes floors slippery. If you must use wax, use non-skid floor wax.  Do not have throw rugs and other things on the floor that can make you trip. What can I do with my stairs?  Do not leave any items on the stairs.  Make sure that there are handrails on both sides of the stairs and use them. Fix handrails that are broken or loose. Make sure that handrails are as long as the stairways.  Check any carpeting to make sure that it is firmly attached to the stairs.  Fix any carpet that is loose or worn.  Avoid having throw rugs at the top or bottom of the stairs. If you do have throw rugs, attach them to the floor with carpet tape.  Make sure that you have a light switch at the top of the stairs and the bottom of the stairs. If you do not have them, ask someone to add them for you. What else can I do to help prevent falls?  Wear shoes that:  Do not have high heels.  Have rubber bottoms.  Are comfortable and fit you well.  Are closed at the toe. Do not wear sandals.  If you use a stepladder:  Make sure that it is fully opened. Do not climb a closed stepladder.  Make sure that both sides of the stepladder are locked into place.  Ask someone to hold it for you, if possible.  Clearly mark and make sure that you can see:  Any grab bars or handrails.  First and last steps.  Where the edge of each step is.  Use tools that help you move around (mobility aids) if they are needed. These include:  Canes.  Walkers.  Scooters.  Crutches.  Turn on the lights when you go into a dark area. Replace any light bulbs as soon as they burn out.  Set up your furniture so you have a clear path. Avoid moving your furniture around.  If any of your floors are uneven, fix them.  If there are any pets around you, be aware of where they are.  Review your medicines with your doctor. Some medicines can make you feel dizzy. This can increase your chance of falling. Ask your doctor what other things that you can do to help prevent falls. This information is not intended to replace advice given to you by your health care provider. Make sure you discuss any questions you have with your health care provider. Document Released: 05/18/2009 Document Revised: 12/28/2015 Document Reviewed: 08/26/2014 Elsevier Interactive Patient Education  2017 [employer].

## 2018-03-10 NOTE — Telephone Encounter (Signed)
Left message on voicemail for patient to return call when available   

## 2018-03-12 ENCOUNTER — Encounter: Payer: Self-pay | Admitting: Internal Medicine

## 2018-03-17 ENCOUNTER — Ambulatory Visit (HOSPITAL_COMMUNITY): Payer: Medicare HMO | Attending: Cardiology

## 2018-03-17 ENCOUNTER — Other Ambulatory Visit: Payer: Self-pay

## 2018-03-17 DIAGNOSIS — R011 Cardiac murmur, unspecified: Secondary | ICD-10-CM | POA: Diagnosis not present

## 2018-03-17 DIAGNOSIS — I351 Nonrheumatic aortic (valve) insufficiency: Secondary | ICD-10-CM | POA: Diagnosis not present

## 2018-03-25 ENCOUNTER — Encounter: Payer: Self-pay | Admitting: Internal Medicine

## 2018-04-09 ENCOUNTER — Other Ambulatory Visit: Payer: Self-pay | Admitting: Internal Medicine

## 2018-04-09 DIAGNOSIS — I10 Essential (primary) hypertension: Secondary | ICD-10-CM

## 2018-04-29 ENCOUNTER — Ambulatory Visit
Admission: RE | Admit: 2018-04-29 | Discharge: 2018-04-29 | Disposition: A | Discharge status: Home / Self Care | Payer: Medicare HMO | Source: Ambulatory Visit | Attending: Internal Medicine | Admitting: Internal Medicine

## 2018-04-29 DIAGNOSIS — Z1231 Encounter for screening mammogram for malignant neoplasm of breast: Secondary | ICD-10-CM | POA: Diagnosis not present

## 2018-04-29 DIAGNOSIS — Z1239 Encounter for other screening for malignant neoplasm of breast: Secondary | ICD-10-CM

## 2018-04-30 ENCOUNTER — Other Ambulatory Visit: Payer: Self-pay | Admitting: Internal Medicine

## 2018-04-30 DIAGNOSIS — R928 Other abnormal and inconclusive findings on diagnostic imaging of breast: Secondary | ICD-10-CM

## 2018-05-05 ENCOUNTER — Other Ambulatory Visit: Payer: Self-pay | Admitting: Internal Medicine

## 2018-05-05 ENCOUNTER — Ambulatory Visit
Admission: RE | Admit: 2018-05-05 | Discharge: 2018-05-05 | Disposition: A | Discharge status: Home / Self Care | Payer: Medicare HMO | Source: Ambulatory Visit | Attending: Internal Medicine | Admitting: Internal Medicine

## 2018-05-05 DIAGNOSIS — R928 Other abnormal and inconclusive findings on diagnostic imaging of breast: Secondary | ICD-10-CM

## 2018-05-05 DIAGNOSIS — N63 Unspecified lump in unspecified breast: Secondary | ICD-10-CM

## 2018-05-05 DIAGNOSIS — N6320 Unspecified lump in the left breast, unspecified quadrant: Secondary | ICD-10-CM | POA: Diagnosis not present

## 2018-05-05 DIAGNOSIS — N6310 Unspecified lump in the right breast, unspecified quadrant: Secondary | ICD-10-CM | POA: Diagnosis not present

## 2018-05-08 ENCOUNTER — Other Ambulatory Visit: Payer: Self-pay | Admitting: Internal Medicine

## 2018-05-08 DIAGNOSIS — J4542 Moderate persistent asthma with status asthmaticus: Secondary | ICD-10-CM

## 2018-05-16 ENCOUNTER — Other Ambulatory Visit: Payer: Self-pay | Admitting: Internal Medicine

## 2018-05-16 DIAGNOSIS — J3089 Other allergic rhinitis: Secondary | ICD-10-CM

## 2018-05-16 DIAGNOSIS — J4540 Moderate persistent asthma, uncomplicated: Secondary | ICD-10-CM

## 2018-06-04 ENCOUNTER — Other Ambulatory Visit: Payer: Self-pay | Admitting: Internal Medicine

## 2018-06-04 DIAGNOSIS — I10 Essential (primary) hypertension: Secondary | ICD-10-CM

## 2018-06-22 ENCOUNTER — Telehealth: Payer: Self-pay

## 2018-06-22 DIAGNOSIS — J4540 Moderate persistent asthma, uncomplicated: Secondary | ICD-10-CM

## 2018-06-22 MED ORDER — ALBUTEROL SULFATE HFA 108 (90 BASE) MCG/ACT IN AERS: INHALATION_SPRAY | RESPIRATORY_TRACT | 3 refills | Status: DC

## 2018-06-22 NOTE — Telephone Encounter (Signed)
Incoming fax received from [facility] requesting refill for Citalopram.   I called patient to check compliance for we last filled citalopram in March 2019 #30/3 refills which would indicate patient ran out of medication in July 2019. Patient states she is out of medication and has no idea how long. Patient then asked the indication for medication. I advised patient medication was associated with generalized anxiety disorder. Patient then stated she does not need this medication refilled, will no longer take. Medication list updated to reflect patient's decision to no longer take citalopram.    Patient states she needs a refill on her inhaler, rx sent as requested    I advised patient that she was instructed to schedule a 6 month follow-up at her last ov with Dr.Carter in August. Scheduled appointment for February 2020 with Sherrie Mustache, NP

## 2018-07-21 ENCOUNTER — Other Ambulatory Visit: Payer: Self-pay | Admitting: *Deleted

## 2018-07-21 DIAGNOSIS — I10 Essential (primary) hypertension: Secondary | ICD-10-CM

## 2018-07-21 DIAGNOSIS — J4540 Moderate persistent asthma, uncomplicated: Secondary | ICD-10-CM

## 2018-07-21 MED ORDER — ALBUTEROL SULFATE HFA 108 (90 BASE) MCG/ACT IN AERS: INHALATION_SPRAY | RESPIRATORY_TRACT | 2 refills | Status: DC

## 2018-07-21 MED ORDER — BISOPROLOL FUMARATE 5 MG PO TABS: ORAL_TABLET | ORAL | 0 refills | Status: DC

## 2018-07-21 MED ORDER — AMLODIPINE BESYLATE 10 MG PO TABS: ORAL_TABLET | ORAL | 0 refills | Status: DC

## 2018-07-21 NOTE — Telephone Encounter (Signed)
[facility] 

## 2018-08-14 ENCOUNTER — Other Ambulatory Visit: Payer: Self-pay | Admitting: Nurse Practitioner

## 2018-08-14 ENCOUNTER — Other Ambulatory Visit: Payer: Self-pay | Admitting: *Deleted

## 2018-08-14 DIAGNOSIS — J3089 Other allergic rhinitis: Secondary | ICD-10-CM

## 2018-08-14 MED ORDER — FLUTICASONE PROPIONATE 50 MCG/ACT NA SUSP: NASAL | 1 refills | Status: DC

## 2018-08-14 NOTE — Telephone Encounter (Signed)
[facility] 

## 2018-08-31 ENCOUNTER — Other Ambulatory Visit: Payer: Self-pay | Admitting: *Deleted

## 2018-08-31 DIAGNOSIS — J4540 Moderate persistent asthma, uncomplicated: Secondary | ICD-10-CM

## 2018-08-31 MED ORDER — FLUTICASONE-SALMETEROL 250-50 MCG/DOSE IN AEPB: INHALATION_SPRAY | RESPIRATORY_TRACT | 0 refills | Status: DC

## 2018-08-31 NOTE — Telephone Encounter (Signed)
[facility] 

## 2018-09-15 ENCOUNTER — Ambulatory Visit: Payer: Self-pay | Admitting: Nurse Practitioner

## 2018-09-15 ENCOUNTER — Ambulatory Visit (INDEPENDENT_AMBULATORY_CARE_PROVIDER_SITE_OTHER): Payer: Medicare HMO | Admitting: Nurse Practitioner

## 2018-09-15 ENCOUNTER — Encounter: Payer: Self-pay | Admitting: Nurse Practitioner

## 2018-09-15 VITALS — BP 126/78 | HR 91 | Temp 98.3°F | Ht 59.0 in | Wt 159.0 lb

## 2018-09-15 DIAGNOSIS — I351 Nonrheumatic aortic (valve) insufficiency: Secondary | ICD-10-CM | POA: Diagnosis not present

## 2018-09-15 DIAGNOSIS — R350 Frequency of micturition: Secondary | ICD-10-CM

## 2018-09-15 DIAGNOSIS — L2082 Flexural eczema: Secondary | ICD-10-CM | POA: Diagnosis not present

## 2018-09-15 DIAGNOSIS — J209 Acute bronchitis, unspecified: Secondary | ICD-10-CM

## 2018-09-15 DIAGNOSIS — I10 Essential (primary) hypertension: Secondary | ICD-10-CM

## 2018-09-15 DIAGNOSIS — R399 Unspecified symptoms and signs involving the genitourinary system: Secondary | ICD-10-CM | POA: Diagnosis not present

## 2018-09-15 DIAGNOSIS — J3089 Other allergic rhinitis: Secondary | ICD-10-CM

## 2018-09-15 DIAGNOSIS — E782 Mixed hyperlipidemia: Secondary | ICD-10-CM

## 2018-09-15 DIAGNOSIS — M542 Cervicalgia: Secondary | ICD-10-CM

## 2018-09-15 DIAGNOSIS — F411 Generalized anxiety disorder: Secondary | ICD-10-CM | POA: Diagnosis not present

## 2018-09-15 DIAGNOSIS — R7309 Other abnormal glucose: Secondary | ICD-10-CM | POA: Diagnosis not present

## 2018-09-15 LAB — POCT URINALYSIS DIPSTICK
BILIRUBIN UA: NEGATIVE
Blood, UA: NEGATIVE
Glucose, UA: NEGATIVE
Ketones, UA: NEGATIVE
Nitrite, UA: POSITIVE
Odor: NORMAL
Protein, UA: POSITIVE — AB
Spec Grav, UA: 1.01 (ref 1.010–1.025)
Urobilinogen, UA: 1 E.U./dL
pH, UA: 7.5 (ref 5.0–8.0)

## 2018-09-15 MED ORDER — FLUTICASONE PROPIONATE 50 MCG/ACT NA SUSP: NASAL | 1 refills | Status: DC

## 2018-09-15 MED ORDER — PREDNISONE 10 MG (21) PO TBPK: ORAL_TABLET | ORAL | 0 refills | Status: DC

## 2018-09-15 MED ORDER — AMOXICILLIN-POT CLAVULANATE 875-125 MG PO TABS: 1.0000 | ORAL_TABLET | Freq: Two times a day (BID) | ORAL | 0 refills | Status: DC

## 2018-09-15 NOTE — Patient Instructions (Addendum)
[place] [facility] may be able to help get you proper care [phone] ext. [phone]  To get xray of your neck and chest Just walk into [facility]-- do not need appt  Start augmentin 875-125 mg by mouth twice daily for 7 days  To take prednisone dose pack  Continue flonase Decrease soda intake and drink more water  Follow up in 2 weeks on shortness of breath, wheezing

## 2018-09-15 NOTE — Progress Notes (Signed)
Careteam: Patient Care Team: [name], NP as PCP - General (Geriatric Medicine)  Advanced Directive information    Allergies  Allergen Reactions  . Ace Inhibitors Other (See Comments)    Angioedema  . Aspirin Anaphylaxis, Swelling and Other (See Comments)    Throat swelling   . Ibuprofen Anaphylaxis, Swelling and Other (See Comments)    Throat swelling  . Nsaids Anaphylaxis, Swelling and Other (See Comments)  . Lipitor [Atorvastatin] Swelling and Other (See Comments)    Face, feet swell    Chief Complaint  Patient presents with  . Medical Management of Chronic Issues    6 month follow-up. Patient c/o thirsty all the time and SOB. Patient also with neck discomfort   . Best Practice Recommendations    Discuss need for Pap and Hep C screening      HPI: Patient is a 58 y.o. female seen in the office today for routine follow up  She continues to have pain in left neck as previously stated in Feb 2019 ov ("constant left neck pain that improves with Tylenol.  No known injury. No dizziness but has occasional HA. 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 Stopped for a while but restarted around feb 2019. She has intermittent needle-like tingling/numbness in finger tips, remains unchanged, not worse. No weakness. She has never had imaging studies. She has occasional hot flashes".). She never went for C spine xray that was ordered in feb 2019- "just never went"  She had colonoscopy in 06/2017 - several precancerous polyps (tubular adenomas) removed and some hyperplastic polyps. She is due for repeat colonoscopy in 2021  Anxiety -in the past mood stable on citalopram. She is off now and reports mood is stable. No panic attacks. She failed GDR  Asthma moderate persistent/allergic rhinitis - She uses albuterol HFA twice daily and takes singulair and zyrtec. She uses flonase daily.  reports  some shortness of breath last week. When she walks and gets out of breath has to stop and it improves. More chest congestion and wheezing in the last 2 weeks.   HTN - BP controlled on amlodipine, hydralazine and bisoprolol  Hx Nasal mass vs polyps - she is s/p removal in May 2017 by Dr Redmond Baseman, ENT. She has rhinorrhea. No longer seeing Dr Redmond Baseman due to financial reasons AND She is unsure if she was supposed to follow up.   Heart murmur - 2D echo in aug 2019 revealed EF 65-70%;  Grade 1 DD; mild AI, MR, TR ;mild pulm HTN (32 mm Hg)  Review of Systems:  Review of Systems  Constitutional: Positive for malaise/fatigue. Negative for chills, fever and weight loss.  HENT: Positive for congestion. Negative for ear discharge, ear pain, hearing loss, sinus pain, sore throat and tinnitus.        Poor dentition, can not afford dental care  Respiratory: Positive for shortness of breath and wheezing. Negative for cough, sputum production and stridor.   Cardiovascular: Negative for chest pain, palpitations, orthopnea and leg swelling.  Genitourinary: Positive for frequency.  Musculoskeletal: Positive for neck pain.  Neurological: Positive for tingling, sensory change and headaches. Negative for dizziness, focal weakness, seizures, loss of consciousness and weakness.  Endo/Heme/Allergies: Positive for polydipsia.    Past Medical History:  Diagnosis Date  . Allergy   . Anemia   . Anxiety   . Asthma   . Environmental allergies   .  GERD (gastroesophageal reflux disease)    Patient denies  . Headache(784.0)   . Hypertension   . Respiratory failure, acute ([place]) 04/19/2014  . Shortness of breath    Past Surgical History:  Procedure Laterality Date  . [place] SINUS SURGERY     Polyps   Social History:   reports that she has never smoked. She has never used smokeless tobacco. She reports that she does not drink alcohol or use drugs.  Family  History  Problem Relation Age of Onset  . Heart attack Father   . Heart attack Mother   . Diabetes Sister   . High blood pressure Brother   . Hypertension Brother   . High blood pressure Sister   . Cancer Sister   . High blood pressure Son   . Cancer Maternal Grandmother   . Heart disease Paternal Grandmother   . Pancreatic cancer Paternal Grandmother   . Asthma Other        Great paternal grandfather   . Autism Son   . Asthma Other        Neice   . Colon cancer Neg Hx   . Esophageal cancer Neg Hx   . Rectal cancer Neg Hx   . Stomach cancer Neg Hx     Medications: Patient's Medications  New Prescriptions   No medications on file  Previous Medications   ACETAMINOPHEN (TYLENOL) 160 MG/5ML SUSPENSION    Take by mouth every 6 (six) hours as needed.   ALBUTEROL (PROVENTIL HFA) 108 (90 BASE) MCG/ACT INHALER    INHALE 2 PUFFS INTO THE LUNGS EVERY 6 HOURS AS NEEDED FOR WHEEZING OR SHORTNESS OF BREATH   AMLODIPINE (NORVASC) 10 MG TABLET    Take one tablet by mouth once daily for high blood pressure   BISOPROLOL (ZEBETA) 5 MG TABLET    Take 1/2 tablet by mouth once daily for high blood pressure   CETIRIZINE (ZYRTEC) 10 MG TABLET    TAKE 1 TABLET(10 MG) BY MOUTH DAILY   FLUTICASONE (FLONASE) 50 MCG/ACT NASAL SPRAY    SHAKE LIQUID AND USE 2 SPRAYS IN EACH NOSTRIL DAILY   FLUTICASONE-SALMETEROL (WIXELA INHUB) 250-50 MCG/DOSE AEPB    INHALE 1 PUFF INTO THE LUNGS TWICE DAILY   HYDRALAZINE (APRESOLINE) 25 MG TABLET    TAKE 1 TABLET(25 MG) BY MOUTH EVERY 8 HOURS   MONTELUKAST (SINGULAIR) 10 MG TABLET    TAKE 1 TABLET(10 MG) BY MOUTH AT BEDTIME   TRIAMCINOLONE (KENALOG) 0.025 % OINTMENT    Apply 1 application topically 2 (two) times daily. For rash  Modified Medications   No medications on file  Discontinued Medications   ACETAMINOPHEN (TYLENOL) 325 MG TABLET    Take 2 tablets (650 mg total) by mouth every 6 (six) hours as needed.     Physical Exam:  Vitals:   09/15/18 1017  BP:  126/78  Pulse: 91  Temp: 98.3 F (36.8 C)  TempSrc: Oral  SpO2: 91%  Weight: 159 lb (72.1 kg)  Height: 4\' 11"  (1.499 m)   Body mass index is 32.11 kg/m.  Physical Exam Constitutional:      General: She is not in acute distress.    Appearance: Normal appearance. She is well-developed.  HENT:     Head: Normocephalic and atraumatic.     Right Ear: Hearing, tympanic membrane, ear canal and external ear normal.     Left Ear: Hearing, tympanic membrane, ear canal and  external ear normal.     Nose: Congestion and rhinorrhea present.     Mouth/Throat:     Dentition: Does not have dentures.     Pharynx: Uvula midline.     Comments: Poor dentition Eyes:     General: Lids are normal. No scleral icterus.    Conjunctiva/sclera: Conjunctivae normal.     Pupils: Pupils are equal, round, and reactive to light.  Neck:     Musculoskeletal: Normal range of motion and neck supple.     Thyroid: No thyroid mass or thyromegaly.     Vascular: No carotid bruit.     Trachea: Trachea normal.  Cardiovascular:     Rate and Rhythm: Normal rate and regular rhythm.     Heart sounds: Murmur (1/6 diastolic) present. No friction rub. No gallop.      Comments: No LE edema b/l. No calf TTP.  Pulmonary:     Effort: Pulmonary effort is normal.     Breath sounds: Decreased breath sounds present. No wheezing, rhonchi or rales.  Abdominal:     General: Bowel sounds are normal. There is no distension.     Palpations: Abdomen is soft. Abdomen is not rigid. There is no mass or pulsatile mass.     Tenderness: There is no abdominal tenderness. There is no guarding or rebound.     Hernia: No hernia is present.  Musculoskeletal: Normal range of motion.        General: No deformity.  Lymphadenopathy:     Head:     Right side of head: No posterior auricular adenopathy.     Left side of head: No posterior auricular adenopathy.     Cervical: No cervical adenopathy.  Skin:    General: Skin is warm and dry.      Findings: Rash (patchy eczematous rash on dorsal left distal leg and foot) present.     Nails: There is no clubbing.   Neurological:     Mental Status: She is alert and oriented to person, place, and time.     Cranial Nerves: No cranial nerve deficit.     Gait: Gait normal.     Deep Tendon Reflexes: Reflexes are normal and symmetric.  Psychiatric:        Speech: Speech normal.        Behavior: Behavior normal.        Thought Content: Thought content normal.     Labs reviewed: Basic Metabolic Panel: Recent Labs    03/10/18 1100  NA 143  K 3.2*  CL 106  CO2 28  GLUCOSE 103*  BUN 8  CREATININE 0.91  CALCIUM 9.2  TSH 1.88   Liver Function Tests: Recent Labs    03/10/18 1100  AST 17  ALT 18  BILITOT 0.5  PROT 7.1   No results for input(s): LIPASE, AMYLASE in the last 8760 hours. No results for input(s): AMMONIA in the last 8760 hours. CBC: Recent Labs    03/10/18 1100  WBC 6.9  NEUTROABS 3,616  HGB 12.7  HCT 38.1  MCV 80.7  PLT 276   Lipid Panel: Recent Labs    03/10/18 1100  CHOL 154  HDL 42*  LDLCALC 83  TRIG 191*  CHOLHDL 3.7   TSH: Recent Labs    03/10/18 1100  TSH 1.88   A1C: No results found for: HGBA1C   Assessment/Plan 1. Non-seasonal allergic rhinitis, unspecified trigger - fluticasone (FLONASE) 50 MCG/ACT nasal spray; 2 sprays in each nostril daily  Dispense: 48  g; Refill: 1  2. Flexural eczema Continues on kenalog cream BID PRN  3. Neck pain on left side -ongoing, uses tylenol PRN, xray was placed last year however due to transportation issues she has not gotten.  4. Essential hypertension Controlled on current regimen. - COMPLETE METABOLIC PANEL WITH GFR - CBC with Differential/Platelets  5. Urinary frequency - POC Urinalysis Dipstick- abnormal - Culture, Urine  6. Generalized anxiety disorder -currently off medication and reports anxiety has been stable.   7. Aortic valve insufficiency, etiology of cardiac valve  disease unspecified Recent echo without significant change.   8. Mixed hyperlipidemia -not currently on medications, will follow up - Lipid panel  9. Acute bronchitis, unspecified organism -with urinary symptoms will start Augmentin 875-125 mg by mouth twice daily  - predniSONE (STERAPRED UNI-PAK 21 TAB) 10 MG (21) TBPK tablet; Use as directed  Dispense: 21 tablet; Refill: 0 -to continue to use albuterol PRN  10. Poor dentition Information provided for dental care.    Next appt: 09/30/2018 [facility]. [place], [facility] [phone]

## 2018-09-16 MED ORDER — TRIAMCINOLONE ACETONIDE 0.025 % EX OINT: 1.0000 "application " | TOPICAL_OINTMENT | Freq: Two times a day (BID) | CUTANEOUS | 1 refills | Status: DC

## 2018-09-18 LAB — COMPLETE METABOLIC PANEL WITH GFR
AG Ratio: 1.2 (calc) (ref 1.0–2.5)
ALT: 17 U/L (ref 6–29)
AST: 21 U/L (ref 10–35)
Albumin: 4.1 g/dL (ref 3.6–5.1)
Alkaline phosphatase (APISO): 118 U/L (ref 37–153)
BUN: 7 mg/dL (ref 7–25)
CO2: 30 mmol/L (ref 20–32)
Calcium: 9.7 mg/dL (ref 8.6–10.4)
Chloride: 104 mmol/L (ref 98–110)
Creat: 0.91 mg/dL (ref 0.50–1.05)
GFR, EST AFRICAN AMERICAN: 80 mL/min/{1.73_m2} (ref >=60)
GFR, Est Non African American: 69 mL/min/{1.73_m2} (ref >=60)
GLUCOSE: 116 mg/dL — AB (ref 65–99)
Globulin: 3.4 g/dL (calc) (ref 1.9–3.7)
Potassium: 3.6 mmol/L (ref 3.5–5.3)
Sodium: 141 mmol/L (ref 135–146)
TOTAL PROTEIN: 7.5 g/dL (ref 6.1–8.1)
Total Bilirubin: 0.7 mg/dL (ref 0.2–1.2)

## 2018-09-18 LAB — CBC WITH DIFFERENTIAL/PLATELET
ABSOLUTE MONOCYTES: 580 {cells}/uL (ref 200–950)
Basophils Absolute: 69 cells/uL (ref 0–200)
Basophils Relative: 1 %
Eosinophils Absolute: 462 cells/uL (ref 15–500)
Eosinophils Relative: 6.7 %
HCT: 39.7 % (ref 35.0–45.0)
Hemoglobin: 13.5 g/dL (ref 11.7–15.5)
Lymphs Abs: 1622 cells/uL (ref 850–3900)
MCH: 27.4 pg (ref 27.0–33.0)
MCHC: 34 g/dL (ref 32.0–36.0)
MCV: 80.5 fL (ref 80.0–100.0)
MPV: 10.4 fL (ref 7.5–12.5)
Monocytes Relative: 8.4 %
Neutro Abs: 4168 cells/uL (ref 1500–7800)
Neutrophils Relative %: 60.4 %
Platelets: 309 10*3/uL (ref 140–400)
RBC: 4.93 10*6/uL (ref 3.80–5.10)
RDW: 13.6 % (ref 11.0–15.0)
Total Lymphocyte: 23.5 %
WBC: 6.9 10*3/uL (ref 3.8–10.8)

## 2018-09-18 LAB — HEMOGLOBIN A1C
Hgb A1c MFr Bld: 6.1 % of total Hgb — ABNORMAL HIGH (ref <5.7)
MEAN PLASMA GLUCOSE: 128 (calc)
eAG (mmol/L): 7.1 (calc)

## 2018-09-18 LAB — TEST AUTHORIZATION

## 2018-09-18 LAB — URINE CULTURE
MICRO NUMBER:: 179516
SPECIMEN QUALITY: ADEQUATE

## 2018-09-18 LAB — LIPID PANEL
Cholesterol: 167 mg/dL (ref <200)
HDL: 43 mg/dL — AB (ref >=50)
LDL Cholesterol (Calc): 95 mg/dL (calc)
NON-HDL CHOLESTEROL (CALC): 124 mg/dL (ref <130)
Total CHOL/HDL Ratio: 3.9 (calc) (ref <5.0)
Triglycerides: 191 mg/dL — ABNORMAL HIGH (ref <150)

## 2018-09-30 ENCOUNTER — Ambulatory Visit (INDEPENDENT_AMBULATORY_CARE_PROVIDER_SITE_OTHER): Payer: Medicare HMO | Admitting: Nurse Practitioner

## 2018-09-30 ENCOUNTER — Encounter: Payer: Self-pay | Admitting: Nurse Practitioner

## 2018-09-30 VITALS — BP 140/80 | HR 70 | Temp 98.1°F | Ht 59.0 in | Wt 158.0 lb

## 2018-09-30 DIAGNOSIS — R739 Hyperglycemia, unspecified: Secondary | ICD-10-CM

## 2018-09-30 DIAGNOSIS — K089 Disorder of teeth and supporting structures, unspecified: Secondary | ICD-10-CM | POA: Diagnosis not present

## 2018-09-30 DIAGNOSIS — N390 Urinary tract infection, site not specified: Secondary | ICD-10-CM | POA: Diagnosis not present

## 2018-09-30 DIAGNOSIS — I351 Nonrheumatic aortic (valve) insufficiency: Secondary | ICD-10-CM | POA: Diagnosis not present

## 2018-09-30 DIAGNOSIS — J4540 Moderate persistent asthma, uncomplicated: Secondary | ICD-10-CM

## 2018-09-30 NOTE — Progress Notes (Signed)
Careteam: Patient Care Team: [name], NP as PCP - General (Geriatric Medicine)  Advanced Directive information Does Patient Have a Medical Advance Directive?: No, Would patient like information on creating a medical advance directive?: Yes (MAU/Ambulatory/Procedural Areas - Information given)  Allergies  Allergen Reactions  . Ace Inhibitors Other (See Comments)    Angioedema  . Aspirin Anaphylaxis, Swelling and Other (See Comments)    Throat swelling   . Ibuprofen Anaphylaxis, Swelling and Other (See Comments)    Throat swelling  . Nsaids Anaphylaxis, Swelling and Other (See Comments)  . Lipitor [Atorvastatin] Swelling and Other (See Comments)    Face, feet swell    Chief Complaint  Patient presents with  . Follow-up    2 week follow-up on shortness of breath   . Advanced Directive    Advance Care Planning, No HCPOA on file     HPI: Patient is a 58 y.o. female seen in the office today for 2 week follow up on shortness of breath.  She has increased congestion, cough and wheezing at last OV. She also noted with urinary symptoms and found to have UTI. She was placed on prednisone and Augmentin. Urinary frequency and lower abdominal discomfort has improved Asthma- reports shortness of breath is "okay" cough and congestion has improved. Has a small amount of thick congestion but has improved.   Still looking into dental care. Review of Systems:  Review of Systems  Constitutional: Positive for malaise/fatigue. Negative for chills, fever and weight loss.  HENT: Positive for congestion. Negative for ear discharge, ear pain, hearing loss, sinus pain, sore throat and tinnitus.        Poor dentition, can not afford dental care  Respiratory: Positive for shortness of breath (with activity). Negative for cough, sputum production, wheezing and stridor.   Cardiovascular: Negative for chest pain, palpitations, orthopnea and leg swelling.  Genitourinary: Negative for  dysuria, frequency and urgency.  Neurological: Positive for tingling and sensory change. Negative for dizziness, focal weakness, seizures, loss of consciousness, weakness and headaches.    Past Medical History:  Diagnosis Date  . Allergy   . Anemia   . Anxiety   . Asthma   . Environmental allergies   . GERD (gastroesophageal reflux disease)    Patient denies  . Headache(784.0)   . Hypertension   . Respiratory failure, acute ([place]) 04/19/2014  . Shortness of breath    Past Surgical History:  Procedure Laterality Date  . [place] SINUS SURGERY     Polyps   Social History:   reports that she has never smoked. She has never used smokeless tobacco. She reports that she does not drink alcohol or use drugs.  Family History  Problem Relation Age of Onset  . Heart attack Father   . Heart attack Mother   . Diabetes Sister   . High blood pressure Brother   . Hypertension Brother   . High blood pressure Sister   . Cancer Sister   . High blood pressure Son   . Cancer Maternal Grandmother   . Heart disease Paternal Grandmother   . Pancreatic cancer Paternal Grandmother   . Asthma Other        Great paternal grandfather   . Autism Son   . Asthma Other        Neice   . Colon cancer Neg Hx   . Esophageal cancer Neg Hx   . Rectal cancer  Neg Hx   . Stomach cancer Neg Hx     Medications: Patient's Medications  New Prescriptions   No medications on file  Previous Medications   ACETAMINOPHEN (TYLENOL) 160 MG/5ML SUSPENSION    Take by mouth every 6 (six) hours as needed.   ALBUTEROL (PROVENTIL HFA) 108 (90 BASE) MCG/ACT INHALER    INHALE 2 PUFFS INTO THE LUNGS EVERY 6 HOURS AS NEEDED FOR WHEEZING OR SHORTNESS OF BREATH   AMLODIPINE (NORVASC) 10 MG TABLET    Take one tablet by mouth once daily for high blood pressure   AMOXICILLIN-CLAVULANATE (AUGMENTIN) 875-125 MG TABLET    Take 1 tablet by mouth 2 (two) times daily.   BISOPROLOL  (ZEBETA) 5 MG TABLET    Take 1/2 tablet by mouth once daily for high blood pressure   CETIRIZINE (ZYRTEC) 10 MG TABLET    TAKE 1 TABLET(10 MG) BY MOUTH DAILY   FLUTICASONE (FLONASE) 50 MCG/ACT NASAL SPRAY    2 sprays in each nostril daily   FLUTICASONE-SALMETEROL (WIXELA INHUB) 250-50 MCG/DOSE AEPB    INHALE 1 PUFF INTO THE LUNGS TWICE DAILY   HYDRALAZINE (APRESOLINE) 25 MG TABLET    TAKE 1 TABLET(25 MG) BY MOUTH EVERY 8 HOURS   MONTELUKAST (SINGULAIR) 10 MG TABLET    TAKE 1 TABLET(10 MG) BY MOUTH AT BEDTIME   PREDNISONE (STERAPRED UNI-PAK 21 TAB) 10 MG (21) TBPK TABLET    Use as directed   TRIAMCINOLONE (KENALOG) 0.025 % OINTMENT    Apply 1 application topically 2 (two) times daily. For rash  Modified Medications   No medications on file  Discontinued Medications   No medications on file     Physical Exam:  Vitals:   09/30/18 0953  BP: 140/80  Pulse: 70  Temp: 98.1 F (36.7 C)  TempSrc: Oral  SpO2: 96%  Weight: 158 lb (71.7 kg)  Height: 4\' 11"  (1.499 m)   Body mass index is 31.91 kg/m.  Physical Exam Constitutional:      General: She is not in acute distress.    Appearance: Normal appearance. She is well-developed.  HENT:     Head: Normocephalic and atraumatic.     Right Ear: Hearing, tympanic membrane, ear canal and external ear normal.     Left Ear: Hearing, tympanic membrane, ear canal and external ear normal.     Nose: Congestion and rhinorrhea present.     Mouth/Throat:     Dentition: Does not have dentures.     Pharynx: Uvula midline.     Comments: Poor dentition Eyes:     General: Lids are normal. No scleral icterus.    Conjunctiva/sclera: Conjunctivae normal.     Pupils: Pupils are equal, round, and reactive to light.  Neck:     Musculoskeletal: Normal range of motion and neck supple.     Thyroid: No thyroid mass or thyromegaly.     Vascular: No carotid bruit.     Trachea: Trachea normal.  Cardiovascular:     Rate and Rhythm: Normal rate and regular  rhythm.     Heart sounds: Murmur (1/6 diastolic) present. No friction rub. No gallop.      Comments: No LE edema b/l. No calf TTP.  Pulmonary:     Effort: Pulmonary effort is normal.     Breath sounds: Decreased breath sounds present. No wheezing, rhonchi or rales.  Abdominal:     General: Bowel sounds are normal. There is no distension.     Palpations: Abdomen is soft. Abdomen  is not rigid. There is no mass or pulsatile mass.     Tenderness: There is no abdominal tenderness. There is no guarding or rebound.     Hernia: No hernia is present.  Musculoskeletal: Normal range of motion.        General: No swelling or deformity.  Lymphadenopathy:     Head:     Right side of head: No posterior auricular adenopathy.     Left side of head: No posterior auricular adenopathy.     Cervical: No cervical adenopathy.  Skin:    General: Skin is warm and dry.     Findings: Rash (patchy eczematous rash on dorsal left distal leg and foot) present.     Nails: There is no clubbing.   Neurological:     Mental Status: She is alert and oriented to person, place, and time.     Cranial Nerves: No cranial nerve deficit.     Gait: Gait normal.     Deep Tendon Reflexes: Reflexes are normal and symmetric.  Psychiatric:        Speech: Speech normal.        Behavior: Behavior normal.        Thought Content: Thought content normal.     Labs reviewed: Basic Metabolic Panel: Recent Labs    03/10/18 1100 09/15/18 1119  NA 143 141  K 3.2* 3.6  CL 106 104  CO2 28 30  GLUCOSE 103* 116*  BUN 8 7  CREATININE 0.91 0.91  CALCIUM 9.2 9.7  TSH 1.88  --    Liver Function Tests: Recent Labs    03/10/18 1100 09/15/18 1119  AST 17 21  ALT 18 17  BILITOT 0.5 0.7  PROT 7.1 7.5   No results for input(s): LIPASE, AMYLASE in the last 8760 hours. No results for input(s): AMMONIA in the last 8760 hours. CBC: Recent Labs    03/10/18 1100 09/15/18 1119  WBC 6.9 6.9  NEUTROABS 3,616 4,168  HGB 12.7 13.5    HCT 38.1 39.7  MCV 80.7 80.5  PLT 276 309   Lipid Panel: Recent Labs    03/10/18 1100 09/15/18 1119  CHOL 154 167  HDL 42* 43*  LDLCALC 83 95  TRIG 191* 191*  CHOLHDL 3.7 3.9   TSH: Recent Labs    03/10/18 1100  TSH 1.88   A1C: Lab Results  Component Value Date   HGBA1C 6.1 (H) 09/15/2018     Assessment/Plan 1. Moderate persistent asthma without complication -improved however not well controlled - Ambulatory referral to Pulmonology for further evaluation and management.   2. Aortic valve insufficiency, etiology of cardiac valve disease unspecified -had echo 03/17/2018; - Normal LV systolic function; mild diastolic dysfunction; mild  LVH; mild AI.  3. Hyperglycemia -discussed dietary modifications due to elevated blood sugar, exercise limited due to asthma.   4. Poor dentition -information provided for denture clinc  5. Urinary tract infection without hematuria, site unspecified -completed Augmentin, symptoms have resolved. Encouraged proper hydration.   Next appt: 4 months for routine follow up [place]. [place], [facility] [phone]

## 2018-09-30 NOTE — Patient Instructions (Addendum)
[facility] Address: [address], [place], [place] [address] Phone: [phone]  Mucinex DM by mouth twice daily with full glass of water for cough and congestion OR mucinex twice daily for mucous/congestion.   Increase fluid intake Limit sugar  Diabetes Mellitus and Nutrition, Adult When you have diabetes (diabetes mellitus), it is very important to have healthy eating habits because your blood sugar (glucose) levels are greatly affected by what you eat and drink. Eating healthy foods in the appropriate amounts, at about the same times every day, can help you:  Control your blood glucose.  Lower your risk of heart disease.  Improve your blood pressure.  Reach or maintain a healthy weight. Every person with diabetes is different, and each person has different needs for a meal plan. Your health care provider may recommend that you work with a diet and nutrition specialist (dietitian) to make a meal plan that is best for you. Your meal plan may vary depending on factors such as:  The calories you need.  The medicines you take.  Your weight.  Your blood glucose, blood pressure, and cholesterol levels.  Your activity level.  Other health conditions you have, such as heart or kidney disease. How do carbohydrates affect me? Carbohydrates, also called carbs, affect your blood glucose level more than any other type of food. Eating carbs naturally raises the amount of glucose in your blood. Carb counting is a method for keeping track of how many carbs you eat. Counting carbs is important to keep your blood glucose at a healthy level, especially if you use insulin or take certain oral diabetes medicines. It is important to know how many carbs you can safely have in each meal. This is different for every person. Your dietitian can help you calculate how many carbs you should have at each meal and for each snack. Foods that contain carbs include:  Bread, cereal, rice, pasta, and  crackers.  Potatoes and corn.  Peas, beans, and lentils.  Milk and yogurt.  Fruit and juice.  Desserts, such as cakes, cookies, ice cream, and candy. How does alcohol affect me? Alcohol can cause a sudden decrease in blood glucose (hypoglycemia), especially if you use insulin or take certain oral diabetes medicines. Hypoglycemia can be a life-threatening condition. Symptoms of hypoglycemia (sleepiness, dizziness, and confusion) are similar to symptoms of having too much alcohol. If your health care provider says that alcohol is safe for you, follow these guidelines:  Limit alcohol intake to no more than 1 drink per day for nonpregnant women and 2 drinks per day for men. One drink equals 12 oz of beer, 5 oz of wine, or 1 oz of hard liquor.  Do not drink on an empty stomach.  Keep yourself hydrated with water, diet soda, or unsweetened iced tea.  Keep in mind that regular soda, juice, and other mixers may contain a lot of sugar and must be counted as carbs. What are tips for following this plan?  Reading food labels  Start by checking the serving size on the "Nutrition Facts" label of packaged foods and drinks. The amount of calories, carbs, fats, and other nutrients listed on the label is based on one serving of the item. Many items contain more than one serving per package.  Check the total grams (g) of carbs in one serving. You can calculate the number of servings of carbs in one serving by dividing the total carbs by 15. For example, if a food has 30 g  of total carbs, it would be equal to 2 servings of carbs.  Check the number of grams (g) of saturated and trans fats in one serving. Choose foods that have low or no amount of these fats.  Check the number of milligrams (mg) of salt (sodium) in one serving. Most people should limit total sodium intake to less than 2,300 mg per day.  Always check the nutrition information of foods labeled as "low-fat" or "nonfat". These foods may be  higher in added sugar or refined carbs and should be avoided.  Talk to your dietitian to identify your daily goals for nutrients listed on the label. Shopping  Avoid buying canned, premade, or processed foods. These foods tend to be high in fat, sodium, and added sugar.  Shop around the outside edge of the grocery store. This includes fresh fruits and vegetables, bulk grains, fresh meats, and fresh dairy. Cooking  Use low-heat cooking methods, such as baking, instead of high-heat cooking methods like deep frying.  Cook using healthy oils, such as olive, canola, or sunflower oil.  Avoid cooking with butter, cream, or high-fat meats. Meal planning  Eat meals and snacks regularly, preferably at the same times every day. Avoid going long periods of time without eating.  Eat foods high in fiber, such as fresh fruits, vegetables, beans, and whole grains. Talk to your dietitian about how many servings of carbs you can eat at each meal.  Eat 4-6 ounces (oz) of lean protein each day, such as lean meat, chicken, fish, eggs, or tofu. One oz of lean protein is equal to: ? 1 oz of meat, chicken, or fish. ? 1 egg. ?  cup of tofu.  Eat some foods each day that contain healthy fats, such as avocado, nuts, seeds, and fish. Lifestyle  Check your blood glucose regularly.  Exercise regularly as told by your health care provider. This may include: ? 150 minutes of moderate-intensity or vigorous-intensity exercise each week. This could be brisk walking, biking, or water aerobics. ? Stretching and doing strength exercises, such as yoga or weightlifting, at least 2 times a week.  Take medicines as told by your health care provider.  Do not use any products that contain nicotine or tobacco, such as cigarettes and e-cigarettes. If you need help quitting, ask your health care provider.  Work with a Social worker or diabetes educator to identify strategies to manage stress and any emotional and social  challenges. Questions to ask a health care provider  Do I need to meet with a diabetes educator?  Do I need to meet with a dietitian?  What number can I call if I have questions?  When are the best times to check my blood glucose? Where to find more information:  American Diabetes Association: diabetes.org  Academy of Nutrition and Dietetics: www.eatright.[employer] of Diabetes and Digestive and Kidney Diseases (NIH): [employer] Summary  A healthy meal plan will help you control your blood glucose and maintain a healthy lifestyle.  Working with a diet and nutrition specialist (dietitian) can help you make a meal plan that is best for you.  Keep in mind that carbohydrates (carbs) and alcohol have immediate effects on your blood glucose levels. It is important to count carbs and to use alcohol carefully. This information is not intended to replace advice given to you by your health care provider. Make sure you discuss any questions you have with your health care provider. Document Released: 04/18/2005 Document Revised: 02/19/2017 Document Reviewed: 08/26/2016  Chartered certified accountant Patient Education  [employer].

## 2018-10-13 ENCOUNTER — Other Ambulatory Visit: Payer: Self-pay | Admitting: Nurse Practitioner

## 2018-10-13 DIAGNOSIS — J4540 Moderate persistent asthma, uncomplicated: Secondary | ICD-10-CM

## 2018-10-26 ENCOUNTER — Other Ambulatory Visit: Payer: Self-pay

## 2018-10-26 ENCOUNTER — Ambulatory Visit (INDEPENDENT_AMBULATORY_CARE_PROVIDER_SITE_OTHER): Payer: Medicare HMO | Admitting: Pulmonary Disease

## 2018-10-26 ENCOUNTER — Encounter: Payer: Self-pay | Admitting: Pulmonary Disease

## 2018-10-26 DIAGNOSIS — J4540 Moderate persistent asthma, uncomplicated: Secondary | ICD-10-CM | POA: Diagnosis not present

## 2018-10-26 MED ORDER — ALBUTEROL SULFATE HFA 108 (90 BASE) MCG/ACT IN AERS: INHALATION_SPRAY | RESPIRATORY_TRACT | 2 refills | Status: DC

## 2018-10-26 MED ORDER — FLUTICASONE-SALMETEROL 250-50 MCG/DOSE IN AEPB: 2.0000 | INHALATION_SPRAY | Freq: Two times a day (BID) | RESPIRATORY_TRACT | 5 refills | Status: DC

## 2018-10-26 NOTE — Progress Notes (Signed)
Subjective:   PATIENT ID: Tammie [name] GENDER: female DOB: 05-13-1961, MRN: [id number]   HPI  Chief Complaint  Patient presents with  . Consult    SOB with excertion    Reason for Visit: New consult for asthma  [name] is a 58 year old female never smoker with asthma, hx cardiac arrest in 2015 who presents with asthma consult.  She was diagnosed in her 58s and previously on steroid inhalers which sometimes helped. She frequently had to use her albuterol. She reports wheezing, cough and shortness of breath. She will wake up with wheezing. After moderate exertion, she has dyspnea on exertion that requires her to stop and catch her breath including walking in the grocery store after 20 minutes. She reports her last asthma exacerbation was two years ago. She is not sure what the names of her medications are. On review of her medications she identifies Advair which she states usually 1 puff twice daily but has increased to 2 puffs twice daily for a few days during the week. 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  Currently disability  Environmental exposures: Cleaning supplies  I have personally reviewed patient's past medical/family/social history, allergies, current medications.  Past Medical History:  Diagnosis Date  . Allergy   . Anemia   . Anxiety   . Asthma   . Environmental allergies   . GERD (gastroesophageal reflux disease)    Patient denies  . Headache(784.0)   . Hypertension   . Respiratory failure, acute ([place]) 04/19/2014  . Shortness of breath      Family History  Problem Relation Age of Onset  . Heart attack Father   . Heart attack Mother   . Diabetes Sister   . High blood pressure Brother   . Hypertension Brother   . High  blood pressure Sister   . Cancer Sister   . High blood pressure Son   . Cancer Maternal Grandmother   . Heart disease Paternal Grandmother   . Pancreatic cancer Paternal Grandmother   . Asthma Other        Great paternal grandfather   . Autism Son   . Asthma Other        Neice   . Colon cancer Neg Hx   . Esophageal cancer Neg Hx   . Rectal cancer Neg Hx   . Stomach cancer Neg Hx      Social History   Occupational History  . Not on file  Tobacco Use  . Smoking status: Never Smoker  . Smokeless tobacco: Never Used  Substance and Sexual Activity  . Alcohol use: No  . Drug use: No  . Sexual activity: Never    Allergies  Allergen Reactions  . Ace Inhibitors Other (See Comments)    Angioedema  . Aspirin Anaphylaxis, Swelling and Other (See Comments)    Throat swelling   . Ibuprofen Anaphylaxis, Swelling and Other (See Comments)    Throat swelling  . Nsaids Anaphylaxis, Swelling and Other (See Comments)  . Lipitor [Atorvastatin] Swelling and Other (See Comments)    Face, feet swell     Outpatient Medications Prior to Visit  Medication Sig Dispense Refill  . acetaminophen (TYLENOL) 160 MG/5ML suspension Take by mouth every 6 (six) hours as needed.    Marland Kitchen amLODipine (  NORVASC) 10 MG tablet Take one tablet by mouth once daily for high blood pressure 90 tablet 0  . bisoprolol (ZEBETA) 5 MG tablet Take 1/2 tablet by mouth once daily for high blood pressure 45 tablet 0  . cetirizine (ZYRTEC) 10 MG tablet TAKE 1 TABLET(10 MG) BY MOUTH DAILY 30 tablet 6  . fluticasone (FLONASE) 50 MCG/ACT nasal spray 2 sprays in each nostril daily 48 g 1  . hydrALAZINE (APRESOLINE) 25 MG tablet TAKE 1 TABLET(25 MG) BY MOUTH EVERY 8 HOURS 90 tablet 6  . montelukast (SINGULAIR) 10 MG tablet TAKE 1 TABLET(10 MG) BY MOUTH AT BEDTIME 90 tablet 1  . triamcinolone (KENALOG) 0.025 % ointment Apply 1 application topically 2 (two) times daily. For rash 30 g 1  . albuterol (PROVENTIL HFA) 108 (90 Base)  MCG/ACT inhaler INHALE 2 PUFFS INTO THE LUNGS EVERY 6 HOURS AS NEEDED FOR WHEEZING OR SHORTNESS OF BREATH 6.7 g 2  . WIXELA INHUB 250-50 MCG/DOSE AEPB INHALE 1 PUFF INTO THE LUNGS TWICE DAILY 60 each 5  . amoxicillin-clavulanate (AUGMENTIN) 875-125 MG tablet Take 1 tablet by mouth 2 (two) times daily. 14 tablet 0  . predniSONE (STERAPRED UNI-PAK 21 TAB) 10 MG (21) TBPK tablet Use as directed 21 tablet 0   No facility-administered medications prior to visit.     Review of Systems  Constitutional: Positive for chills. Negative for diaphoresis, fever, malaise/fatigue and weight loss.  HENT: Positive for congestion. Negative for ear pain and sore throat.   Respiratory: Positive for sputum production, shortness of breath and wheezing. Negative for cough and hemoptysis.   Cardiovascular: Positive for chest pain and palpitations. Negative for leg swelling.  Gastrointestinal: Negative for abdominal pain, heartburn and nausea.  Genitourinary: Negative for frequency.  Musculoskeletal: Negative for joint pain and myalgias.  Skin: Negative for itching and rash.  Neurological: Negative for dizziness, weakness and headaches.  Endo/Heme/Allergies: Does not bruise/bleed easily.  Psychiatric/Behavioral: Negative for depression. The patient is nervous/anxious.      Objective:   Vitals:   10/26/18 1506  BP: 130/64  Pulse: 84  SpO2: 96%  Weight: 160 lb (72.6 kg)  Height: 4\' 11"  (1.499 m)   SpO2: 96 % O2 Device: None (Room air)  Physical Exam: General: Well-appearing, no acute distress HENT: [place], AT, OP clear, MMM Eyes: EOMI, no scleral icterus Respiratory: Clear to auscultation bilaterally.  No crackles, wheezing or rales Cardiovascular: RRR, -M/R/G, no JVD GI: BS+, soft, nontender Extremities:-Edema,-tenderness Neuro: AAO x4, CNII-XII grossly intact Skin: Intact, no rashes or bruising Psych: Normal mood, normal affect  Data Reviewed:  Imaging: CXR 04/19/14 - Mildly enlarged cardiac  silhouette. No infiltrate   PFT: None on file  Labs: CBC wit diff on 09/15/18 reviewed. Absolute eosinophil 462 (6.7%)  TTE: 03/17/18 EF 65-70%, no regional WMA, grade 1 diastolic dysfunction  Imaging, labs and tests noted above have been reviewed independently by me.    Assessment & Plan:   Discussion: 58 year old female never smoker with history of adult-onset asthma who presents with shortness of breath, wheezing and nocturnal cough concerning for asthma. Will increase daily steroid inhaler intake. Not in exacerbation so no prednisone needed. Eosinophils noted to be elevated to 462 last month. Will obtain CBC with diff and IgE at next visit after PFTs have been reviewed.  Asthma Exacerbation --INCREASE Advair 250-50 2 puffs twice a day --CONTINUE Albuterol as needed every 4 hours for shortness of breath or wheezing --Obtain PFTs at next visit --Follow-up in 4 months  Allergic Rhinitis --CONTINUE Flonase 1 spray per nostril --CONTINUE zyrtec 10 mg daily   No orders of the defined types were placed in this encounter.  Meds ordered this encounter  Medications  . Fluticasone-Salmeterol (WIXELA INHUB) 250-50 MCG/DOSE AEPB    Sig: Inhale 2 puffs into the lungs 2 (two) times daily.    Dispense:  60 each    Refill:  5  . albuterol (PROVENTIL HFA) 108 (90 Base) MCG/ACT inhaler    Sig: INHALE 2 PUFFS INTO THE LUNGS EVERY 6 HOURS AS NEEDED FOR WHEEZING OR SHORTNESS OF BREATH    Dispense:  6.7 g    Refill:  2    Return in about 4 months (around 02/25/2019).  [place], MD [facility] 10/26/2018 4:45 PM  Office Number [phone]

## 2018-10-26 NOTE — Patient Instructions (Addendum)
Asthma Exacerbation --INCREASE Advair 250-50 2 puffs twice a day --CONTINUE Albuterol as needed every 4 hours for shortness of breath or wheezing --Obtain PFTs at next visit --Follow-up in 4 months   Allergic Rhinitis --CONTINUE Flonase 1 spray per nostril --CONTINUE zyrtec 10 mg daily

## 2018-10-27 ENCOUNTER — Other Ambulatory Visit: Payer: Self-pay | Admitting: Nurse Practitioner

## 2018-10-27 DIAGNOSIS — I10 Essential (primary) hypertension: Secondary | ICD-10-CM

## 2018-11-05 ENCOUNTER — Other Ambulatory Visit: Payer: Self-pay

## 2018-11-05 ENCOUNTER — Ambulatory Visit
Admission: RE | Admit: 2018-11-05 | Discharge: 2018-11-05 | Disposition: A | Discharge status: Home / Self Care | Payer: Medicare HMO | Source: Ambulatory Visit | Attending: Internal Medicine | Admitting: Internal Medicine

## 2018-11-05 ENCOUNTER — Other Ambulatory Visit: Payer: Self-pay | Admitting: Internal Medicine

## 2018-11-05 ENCOUNTER — Other Ambulatory Visit: Payer: Self-pay | Admitting: Nurse Practitioner

## 2018-11-05 DIAGNOSIS — N63 Unspecified lump in unspecified breast: Secondary | ICD-10-CM

## 2018-11-05 DIAGNOSIS — N6321 Unspecified lump in the left breast, upper outer quadrant: Secondary | ICD-10-CM | POA: Diagnosis not present

## 2018-11-05 DIAGNOSIS — N6310 Unspecified lump in the right breast, unspecified quadrant: Secondary | ICD-10-CM | POA: Diagnosis not present

## 2018-11-05 DIAGNOSIS — N6312 Unspecified lump in the right breast, upper inner quadrant: Secondary | ICD-10-CM | POA: Diagnosis not present

## 2018-11-05 DIAGNOSIS — N6320 Unspecified lump in the left breast, unspecified quadrant: Secondary | ICD-10-CM | POA: Diagnosis not present

## 2018-11-16 ENCOUNTER — Other Ambulatory Visit: Payer: Self-pay | Admitting: Nurse Practitioner

## 2018-11-16 DIAGNOSIS — J3089 Other allergic rhinitis: Secondary | ICD-10-CM

## 2018-11-29 ENCOUNTER — Encounter: Payer: Self-pay | Admitting: Nurse Practitioner

## 2018-12-06 ENCOUNTER — Other Ambulatory Visit: Payer: Self-pay | Admitting: Nurse Practitioner

## 2018-12-06 DIAGNOSIS — I10 Essential (primary) hypertension: Secondary | ICD-10-CM

## 2018-12-29 ENCOUNTER — Other Ambulatory Visit: Payer: Self-pay | Admitting: *Deleted

## 2018-12-29 DIAGNOSIS — I10 Essential (primary) hypertension: Secondary | ICD-10-CM

## 2018-12-29 MED ORDER — HYDRALAZINE HCL 25 MG PO TABS: ORAL_TABLET | ORAL | 3 refills | Status: DC

## 2018-12-29 NOTE — Telephone Encounter (Signed)
[place]

## 2019-01-16 ENCOUNTER — Other Ambulatory Visit: Payer: Self-pay | Admitting: Nurse Practitioner

## 2019-01-16 DIAGNOSIS — I10 Essential (primary) hypertension: Secondary | ICD-10-CM

## 2019-01-29 ENCOUNTER — Ambulatory Visit (INDEPENDENT_AMBULATORY_CARE_PROVIDER_SITE_OTHER): Payer: Medicare HMO | Admitting: Nurse Practitioner

## 2019-01-29 ENCOUNTER — Encounter: Payer: Self-pay | Admitting: Nurse Practitioner

## 2019-01-29 ENCOUNTER — Other Ambulatory Visit: Payer: Self-pay

## 2019-01-29 VITALS — BP 122/78 | HR 82 | Temp 98.9°F | Ht 59.0 in | Wt 160.0 lb

## 2019-01-29 DIAGNOSIS — I10 Essential (primary) hypertension: Secondary | ICD-10-CM

## 2019-01-29 DIAGNOSIS — I351 Nonrheumatic aortic (valve) insufficiency: Secondary | ICD-10-CM | POA: Diagnosis not present

## 2019-01-29 DIAGNOSIS — M542 Cervicalgia: Secondary | ICD-10-CM | POA: Diagnosis not present

## 2019-01-29 DIAGNOSIS — J4540 Moderate persistent asthma, uncomplicated: Secondary | ICD-10-CM

## 2019-01-29 DIAGNOSIS — J302 Other seasonal allergic rhinitis: Secondary | ICD-10-CM | POA: Diagnosis not present

## 2019-01-29 DIAGNOSIS — J3089 Other allergic rhinitis: Secondary | ICD-10-CM | POA: Diagnosis not present

## 2019-01-29 DIAGNOSIS — F411 Generalized anxiety disorder: Secondary | ICD-10-CM | POA: Diagnosis not present

## 2019-01-29 MED ORDER — FLUTICASONE PROPIONATE 50 MCG/ACT NA SUSP: NASAL | 3 refills | Status: DC

## 2019-01-29 NOTE — Progress Notes (Signed)
Careteam: Patient Care Team: [name], NP as PCP - General (Geriatric Medicine)  Advanced Directive information Does Patient Have a Medical Advance Directive?: No, Would patient like information on creating a medical advance directive?: Yes (MAU/Ambulatory/Procedural Areas - Information given)(Paperwork given at previous visit)  Allergies  Allergen Reactions  . Ace Inhibitors Other (See Comments)    Angioedema  . Aspirin Anaphylaxis, Swelling and Other (See Comments)    Throat swelling   . Ibuprofen Anaphylaxis, Swelling and Other (See Comments)    Throat swelling  . Nsaids Anaphylaxis, Swelling and Other (See Comments)  . Lipitor [Atorvastatin] Swelling and Other (See Comments)    Face, feet swell    Chief Complaint  Patient presents with  . Medical Management of Chronic Issues    4 month follow-up. Patient c/o ongoing neck pain   . Quality Metric Gaps    Discuss need for pap  . Best Practice Recommendations    Discuss need for Hep C Screening      HPI: Patient is a 58 y.o. female seen in the office today for routine follow up  Asthma- reports breathing is "okay" told her to increase Advair 250-50 2 puffs twice a day, continues to need albuterol- some weeks are worse than others. Has use 3 times this week.   Allergic rhinitis- using flonase and zyrtec   Continues to have neck pain-reports it is really sore. Hard to lay down and get comfortable. Xray was ordered last year but never went to get. Reports occasional numbness to bilateral hands ("not enough to talk about") has been coming and going for a long time- years. Used to have muscle spasm really bad. Uses benegay and tylenol which sometimes help.  No weakness in hands. Pain 5/10, tight, sore.   She had colonoscopy in 06/2017 - several precancerous polyps (tubular adenomas) removed and some hyperplastic polyps. She is due for repeat colonoscopy in 2021  Anxiety -in the past mood stable on citalopram. She  is off now and reports mood is stable. No panic attacks.   htn- continues on zebeta, hydralazine, and amlodipine. Has cut back on her sodium.   Scared to go in and have her teeth checked out.   Review of Systems:  Review of Systems  Constitutional: Negative for chills, fever, malaise/fatigue and weight loss.  HENT: Positive for congestion. Negative for ear discharge, ear pain, hearing loss, sinus pain, sore throat and tinnitus.        Poor dentition, can not afford dental care  Respiratory: Negative for cough, sputum production, shortness of breath, wheezing and stridor.   Cardiovascular: Negative for chest pain, palpitations, orthopnea and leg swelling.  Gastrointestinal: Negative for constipation and diarrhea.  Genitourinary: Negative for dysuria, frequency and urgency.  Musculoskeletal: Positive for myalgias and neck pain.  Neurological: Positive for tingling and sensory change. Negative for dizziness, focal weakness, seizures, loss of consciousness, weakness and headaches.  Psychiatric/Behavioral: Negative for depression. The patient is not nervous/anxious.     Past Medical History:  Diagnosis Date  . Allergy   . Anemia   . Anxiety   . Asthma   . Environmental allergies   . GERD (gastroesophageal reflux disease)    Patient denies  . Headache(784.0)   . Hypertension   . Respiratory failure, acute ([place]) 04/19/2014  . Shortness of breath    Past Surgical History:  Procedure Laterality Date  . [place]  Polyps   Social History:   reports that she has never smoked. She has never used smokeless tobacco. She reports that she does not drink alcohol or use drugs.  Family History  Problem Relation Age of Onset  . Heart attack Father   . Heart attack Mother   . Diabetes Sister   . High blood pressure Brother   . Hypertension Brother   . High blood pressure Sister   . Cancer Sister   . High blood pressure  Son   . Cancer Maternal Grandmother   . Heart disease Paternal Grandmother   . Pancreatic cancer Paternal Grandmother   . Asthma Other        Great paternal grandfather   . Autism Son   . Asthma Other        Neice   . Colon cancer Neg Hx   . Esophageal cancer Neg Hx   . Rectal cancer Neg Hx   . Stomach cancer Neg Hx     Medications: Patient's Medications  New Prescriptions   No medications on file  Previous Medications   ACETAMINOPHEN (TYLENOL) 160 MG/5ML SUSPENSION    Take by mouth every 6 (six) hours as needed.   ALBUTEROL (PROVENTIL HFA) 108 (90 BASE) MCG/ACT INHALER    INHALE 2 PUFFS INTO THE LUNGS EVERY 6 HOURS AS NEEDED FOR WHEEZING OR SHORTNESS OF BREATH   AMLODIPINE (NORVASC) 10 MG TABLET    TAKE 1 TABLET BY MOUTH EVERY DAY FOR HIGH BLOOD PRESSURE   BISOPROLOL (ZEBETA) 5 MG TABLET    TAKE 1/2 TABLET BY MOUTH EVERY DAY FOR HIGH BLOOD PRESSURE   CETIRIZINE (ZYRTEC) 10 MG TABLET    TAKE 1 TABLET(10 MG) BY MOUTH DAILY   FLUTICASONE (FLONASE) 50 MCG/ACT NASAL SPRAY    SHAKE LIQUID AND USE 2 SPRAYS IN EACH NOSTRIL DAILY   FLUTICASONE-SALMETEROL (WIXELA INHUB) 250-50 MCG/DOSE AEPB    Inhale 2 puffs into the lungs 2 (two) times daily.   HYDRALAZINE (APRESOLINE) 25 MG TABLET    Take one tablet by mouth every 8 hours   LEVOCETIRIZINE DIHYDROCHLORIDE (XYZAL ALLERGY 24HR PO)    Take 1 tablet by mouth daily.   OMEGA-3 FATTY ACIDS (FISH OIL) 500 MG CAPS    Take 1 capsule by mouth daily.   TRIAMCINOLONE (KENALOG) 0.025 % OINTMENT    Apply 1 application topically 2 (two) times daily. For rash  Modified Medications   No medications on file  Discontinued Medications   MONTELUKAST (SINGULAIR) 10 MG TABLET    TAKE 1 TABLET(10 MG) BY MOUTH AT BEDTIME    Physical Exam:  Vitals:   01/29/19 1104  BP: 122/78  Pulse: 82  Temp: 98.9 F (37.2 C)  TempSrc: Oral  SpO2: 96%  Weight: 160 lb (72.6 kg)  Height: 4\' 11"  (1.499 m)   Body mass index is 32.32 kg/m. Wt Readings from Last 3  Encounters:  01/29/19 160 lb (72.6 kg)  10/26/18 160 lb (72.6 kg)  09/30/18 158 lb (71.7 kg)    Physical Exam Constitutional:      General: She is not in acute distress.    Appearance: Normal appearance. She is well-developed.  HENT:     Head: Normocephalic and atraumatic.     Right Ear: Hearing, tympanic membrane, ear canal and external ear normal.     Left Ear: Hearing, tympanic membrane, ear canal and external ear normal.     Nose: Congestion and rhinorrhea present.     Mouth/Throat:  Dentition: Does not have dentures.     Pharynx: Uvula midline.     Comments: Poor dentition Eyes:     General: Lids are normal. No scleral icterus.    Conjunctiva/sclera: Conjunctivae normal.     Pupils: Pupils are equal, round, and reactive to light.  Neck:     Musculoskeletal: Normal range of motion and neck supple.     Thyroid: No thyroid mass or thyromegaly.     Vascular: No carotid bruit.     Trachea: Trachea normal.      Comments: Tenderness noted, no decrease in ROm Cardiovascular:     Rate and Rhythm: Normal rate and regular rhythm.     Heart sounds: Murmur (1/6 diastolic) present. No friction rub. No gallop.      Comments: No LE edema b/l. No calf TTP.  Pulmonary:     Effort: Pulmonary effort is normal.     Breath sounds: Wheezing (end expiratory) present. No rhonchi or rales.  Abdominal:     General: Bowel sounds are normal. There is no distension.     Palpations: Abdomen is soft. Abdomen is not rigid. There is no mass or pulsatile mass.     Tenderness: There is no abdominal tenderness. There is no guarding or rebound.     Hernia: No hernia is present.  Musculoskeletal: Normal range of motion.        General: No swelling or deformity.  Lymphadenopathy:     Head:     Right side of head: No posterior auricular adenopathy.     Left side of head: No posterior auricular adenopathy.     Cervical: No cervical adenopathy.  Skin:    General: Skin is warm and dry.     Findings:  Rash (patchy eczematous rash on dorsal left distal leg and foot) present.     Nails: There is no clubbing.   Neurological:     Mental Status: She is alert and oriented to person, place, and time.     Cranial Nerves: No cranial nerve deficit.     Sensory: Sensation is intact.     Gait: Gait normal.     Deep Tendon Reflexes: Reflexes are normal and symmetric.  Psychiatric:        Speech: Speech normal.        Behavior: Behavior normal.        Thought Content: Thought content normal.     Labs reviewed: Basic Metabolic Panel: Recent Labs    03/10/18 1100 09/15/18 1119  NA 143 141  K 3.2* 3.6  CL 106 104  CO2 28 30  GLUCOSE 103* 116*  BUN 8 7  CREATININE 0.91 0.91  CALCIUM 9.2 9.7  TSH 1.88  --    Liver Function Tests: Recent Labs    03/10/18 1100 09/15/18 1119  AST 17 21  ALT 18 17  BILITOT 0.5 0.7  PROT 7.1 7.5   No results for input(s): LIPASE, AMYLASE in the last 8760 hours. No results for input(s): AMMONIA in the last 8760 hours. CBC: Recent Labs    03/10/18 1100 09/15/18 1119  WBC 6.9 6.9  NEUTROABS 3,616 4,168  HGB 12.7 13.5  HCT 38.1 39.7  MCV 80.7 80.5  PLT 276 309   Lipid Panel: Recent Labs    03/10/18 1100 09/15/18 1119  CHOL 154 167  HDL 42* 43*  LDLCALC 83 95  TRIG 191* 191*  CHOLHDL 3.7 3.9   TSH: Recent Labs    03/10/18 1100  TSH 1.88  A1C: Lab Results  Component Value Date   HGBA1C 6.1 (H) 09/15/2018     Assessment/Plan 1. Neck pain on left side -ongoing, never went to get xray of neck and order expired, she has a hard time with transportation.  -encouraged use of heat to neck with muscle rub after heat -can use tylenol PRN - Ambulatory referral to Neurosurgery place due to paraesthesias noted to bilateral hands, hopefully imagining can be done in office   2. Moderate persistent asthma without complication Following with pulmonary at this time.  Continues on Fluticasone-Salmeterol 250-50 MCG 2 puffs twice daily  -continues to use albuterol PRN, some weeks are worse than others.   3. Generalized anxiety disorder Controlled, off all medication at this time.   4. Chronic seasonal allergic rhinitis Stable, continues on Flonase and zyrtec   5. Essential hypertension -encouraged dietary compliance. Continues on hydralazine, zebeta and norvasc.   6. Aortic valve insufficiency, etiology of cardiac valve disease unspecified There was mild regurgitation noted on echo from 2019. No chest pains, worsening shortness of breath, edema noted.    Next appt: 3 months, will do PAP at that time, hx states she had a hysterotomy but she states she had her son afterwards therefore I think this is an error.  Carlos American. [place], [facility] [phone]

## 2019-01-29 NOTE — Patient Instructions (Addendum)
Continue to work on diet and increasing physical activity as tolerates  Recommend calling and getting teeth evaluated.   Neuro specialist referral placed for further evaluation of back/neck pain.   DASH Eating Plan DASH stands for "Dietary Approaches to Stop Hypertension." The DASH eating plan is a healthy eating plan that has been shown to reduce high blood pressure (hypertension). It may also reduce your risk for type 2 diabetes, heart disease, and stroke. The DASH eating plan may also help with weight loss. What are tips for following this plan?  General guidelines  Avoid eating more than 2,300 mg (milligrams) of salt (sodium) a day. If you have hypertension, you may need to reduce your sodium intake to 1,500 mg a day.  Limit alcohol intake to no more than 1 drink a day for nonpregnant women and 2 drinks a day for men. One drink equals 12 oz of beer, 5 oz of wine, or 1 oz of hard liquor.  Work with your health care provider to maintain a healthy body weight or to lose weight. Ask what an ideal weight is for you.  Get at least 30 minutes of exercise that causes your heart to beat faster (aerobic exercise) most days of the week. Activities may include walking, swimming, or biking.  Work with your health care provider or diet and nutrition specialist (dietitian) to adjust your eating plan to your individual calorie needs. Reading food labels   Check food labels for the amount of sodium per serving. Choose foods with less than 5 percent of the Daily Value of sodium. Generally, foods with less than 300 mg of sodium per serving fit into this eating plan.  To find whole grains, look for the word "whole" as the first word in the ingredient list. Shopping  Buy products labeled as "low-sodium" or "no salt added."  Buy fresh foods. Avoid canned foods and premade or frozen meals. Cooking  Avoid adding salt when cooking. Use salt-free seasonings or herbs instead of table salt or sea salt.  Check with your health care provider or pharmacist before using salt substitutes.  Do not fry foods. Cook foods using healthy methods such as baking, boiling, grilling, and broiling instead.  Cook with heart-healthy oils, such as olive, canola, soybean, or sunflower oil. Meal planning  Eat a balanced diet that includes: ? 5 or more servings of fruits and vegetables each day. At each meal, try to fill half of your plate with fruits and vegetables. ? Up to 6-8 servings of whole grains each day. ? Less than 6 oz of lean meat, poultry, or fish each day. A 3-oz serving of meat is about the same size as a deck of cards. One egg equals 1 oz. ? 2 servings of low-fat dairy each day. ? A serving of nuts, seeds, or beans 5 times each week. ? Heart-healthy fats. Healthy fats called Omega-3 fatty acids are found in foods such as flaxseeds and coldwater fish, like sardines, salmon, and mackerel.  Limit how much you eat of the following: ? Canned or prepackaged foods. ? Food that is high in trans fat, such as fried foods. ? Food that is high in saturated fat, such as fatty meat. ? Sweets, desserts, sugary drinks, and other foods with added sugar. ? Full-fat dairy products.  Do not salt foods before eating.  Try to eat at least 2 vegetarian meals each week.  Eat more home-cooked food and less restaurant, buffet, and fast food.  When eating at a restaurant,  ask that your food be prepared with less salt or no salt, if possible. What foods are recommended? The items listed may not be a complete list. Talk with your dietitian about what dietary choices are best for you. Grains Whole-grain or whole-wheat bread. Whole-grain or whole-wheat pasta. Brown rice. [place]. Bulgur. Whole-grain and low-sodium cereals. Pita bread. Low-fat, low-sodium crackers. Whole-wheat flour tortillas. Vegetables Fresh or frozen vegetables (raw, steamed, roasted, or grilled). Low-sodium or reduced-sodium tomato and  vegetable juice. Low-sodium or reduced-sodium tomato sauce and tomato paste. Low-sodium or reduced-sodium canned vegetables. Fruits All fresh, dried, or frozen fruit. Canned fruit in natural juice (without added sugar). Meat and other protein foods Skinless chicken or [place]. Ground chicken or [place]. Pork with fat trimmed off. Fish and seafood. Egg whites. Dried beans, peas, or lentils. Unsalted nuts, nut butters, and seeds. Unsalted canned beans. Lean cuts of beef with fat trimmed off. Low-sodium, lean deli meat. Dairy Low-fat (1%) or fat-free (skim) milk. Fat-free, low-fat, or reduced-fat cheeses. Nonfat, low-sodium ricotta or cottage cheese. Low-fat or nonfat yogurt. Low-fat, low-sodium cheese. Fats and oils Soft margarine without trans fats. Vegetable oil. Low-fat, reduced-fat, or light mayonnaise and salad dressings (reduced-sodium). Canola, safflower, olive, soybean, and sunflower oils. Avocado. Seasoning and other foods Herbs. Spices. Seasoning mixes without salt. Unsalted popcorn and pretzels. Fat-free sweets. What foods are not recommended? The items listed may not be a complete list. Talk with your dietitian about what dietary choices are best for you. Grains Baked goods made with fat, such as croissants, muffins, or some breads. Dry pasta or rice meal packs. Vegetables Creamed or fried vegetables. Vegetables in a cheese sauce. Regular canned vegetables (not low-sodium or reduced-sodium). Regular canned tomato sauce and paste (not low-sodium or reduced-sodium). Regular tomato and vegetable juice (not low-sodium or reduced-sodium). Angie Fava. Olives. Fruits Canned fruit in a light or heavy syrup. Fried fruit. Fruit in cream or butter sauce. Meat and other protein foods Fatty cuts of meat. Ribs. Fried meat. Berniece Salines. Sausage. Bologna and other processed lunch meats. Salami. Fatback. Hotdogs. Bratwurst. Salted nuts and seeds. Canned beans with added salt. Canned or smoked fish. Whole eggs or  egg yolks. Chicken or [place] with skin. Dairy Whole or 2% milk, cream, and half-and-half. Whole or full-fat cream cheese. Whole-fat or sweetened yogurt. Full-fat cheese. Nondairy creamers. Whipped toppings. Processed cheese and cheese spreads. Fats and oils Butter. Stick margarine. Lard. Shortening. Ghee. Bacon fat. Tropical oils, such as coconut, palm kernel, or palm oil. Seasoning and other foods Salted popcorn and pretzels. Onion salt, garlic salt, seasoned salt, table salt, and sea salt. Worcestershire sauce. Tartar sauce. Barbecue sauce. Teriyaki sauce. Soy sauce, including reduced-sodium. Steak sauce. Canned and packaged gravies. Fish sauce. Oyster sauce. Cocktail sauce. Horseradish that you find on the shelf. Ketchup. Mustard. Meat flavorings and tenderizers. Bouillon cubes. Hot sauce and Tabasco sauce. Premade or packaged marinades. Premade or packaged taco seasonings. Relishes. Regular salad dressings. Where to find more information:  National Heart, Lung, and [place]: https://wilson-eaton.com/  American Heart Association: www.heart.org Summary  The DASH eating plan is a healthy eating plan that has been shown to reduce high blood pressure (hypertension). It may also reduce your risk for type 2 diabetes, heart disease, and stroke.  With the DASH eating plan, you should limit salt (sodium) intake to 2,300 mg a day. If you have hypertension, you may need to reduce your sodium intake to 1,500 mg a day.  When on the DASH eating plan, aim to eat more fresh  fruits and vegetables, whole grains, lean proteins, low-fat dairy, and heart-healthy fats.  Work with your health care provider or diet and nutrition specialist (dietitian) to adjust your eating plan to your individual calorie needs. This information is not intended to replace advice given to you by your health care provider. Make sure you discuss any questions you have with your health care provider. Document Released: 07/11/2011  Document Revised: 07/15/2016 Document Reviewed: 07/15/2016 Elsevier Interactive Patient Education  2019 [employer].

## 2019-02-10 DIAGNOSIS — M5412 Radiculopathy, cervical region: Secondary | ICD-10-CM | POA: Diagnosis not present

## 2019-03-02 DIAGNOSIS — M542 Cervicalgia: Secondary | ICD-10-CM | POA: Diagnosis not present

## 2019-03-02 DIAGNOSIS — M5412 Radiculopathy, cervical region: Secondary | ICD-10-CM | POA: Diagnosis not present

## 2019-03-11 DIAGNOSIS — H40013 Open angle with borderline findings, low risk, bilateral: Secondary | ICD-10-CM | POA: Diagnosis not present

## 2019-03-11 DIAGNOSIS — H0102A Squamous blepharitis right eye, upper and lower eyelids: Secondary | ICD-10-CM | POA: Diagnosis not present

## 2019-03-11 DIAGNOSIS — H0102B Squamous blepharitis left eye, upper and lower eyelids: Secondary | ICD-10-CM | POA: Diagnosis not present

## 2019-03-11 DIAGNOSIS — H25813 Combined forms of age-related cataract, bilateral: Secondary | ICD-10-CM | POA: Diagnosis not present

## 2019-03-12 ENCOUNTER — Other Ambulatory Visit: Payer: Self-pay

## 2019-03-12 ENCOUNTER — Encounter: Payer: Self-pay | Admitting: Nurse Practitioner

## 2019-03-12 ENCOUNTER — Ambulatory Visit: Payer: Self-pay

## 2019-03-12 ENCOUNTER — Ambulatory Visit (INDEPENDENT_AMBULATORY_CARE_PROVIDER_SITE_OTHER): Payer: Medicare HMO | Admitting: Nurse Practitioner

## 2019-03-12 VITALS — BP 120/78 | HR 79 | Temp 98.1°F | Resp 20 | Ht 59.0 in | Wt 162.2 lb

## 2019-03-12 DIAGNOSIS — Z0000 Encounter for general adult medical examination without abnormal findings: Secondary | ICD-10-CM

## 2019-03-12 DIAGNOSIS — Z124 Encounter for screening for malignant neoplasm of cervix: Secondary | ICD-10-CM

## 2019-03-12 DIAGNOSIS — J4540 Moderate persistent asthma, uncomplicated: Secondary | ICD-10-CM

## 2019-03-12 DIAGNOSIS — E782 Mixed hyperlipidemia: Secondary | ICD-10-CM | POA: Diagnosis not present

## 2019-03-12 DIAGNOSIS — J302 Other seasonal allergic rhinitis: Secondary | ICD-10-CM | POA: Diagnosis not present

## 2019-03-12 DIAGNOSIS — Z23 Encounter for immunization: Secondary | ICD-10-CM

## 2019-03-12 DIAGNOSIS — I10 Essential (primary) hypertension: Secondary | ICD-10-CM | POA: Diagnosis not present

## 2019-03-12 DIAGNOSIS — L2082 Flexural eczema: Secondary | ICD-10-CM

## 2019-03-12 MED ORDER — TRIAMCINOLONE ACETONIDE 0.025 % EX OINT: 1.0000 "application " | TOPICAL_OINTMENT | Freq: Two times a day (BID) | CUTANEOUS | 1 refills | Status: DC

## 2019-03-12 NOTE — Progress Notes (Signed)
Provider: [name], NP  Patient Care Team: [name], NP as PCP - General (Geriatric Medicine) [name], MD as Consulting Physician (Ophthalmology)  Extended Emergency Contact Information Primary Emergency Contact: Jaso,Shirley Address: [address]          [place] [address] [place] Phone: [phone] Relation: Sister Secondary Emergency Contact: Winferd Humphrey [place] Phone: [phone] Relation: Brother Allergies  Allergen Reactions  . Ace Inhibitors Other (See Comments)    Angioedema  . Aspirin Anaphylaxis, Swelling and Other (See Comments)    Throat swelling   . Ibuprofen Anaphylaxis, Swelling and Other (See Comments)    Throat swelling  . Nsaids Anaphylaxis, Swelling and Other (See Comments)  . Lipitor [Atorvastatin] Swelling and Other (See Comments)    Face, feet swell   Code Status: FULL Goals of Care: Advanced Directive information Advanced Directives 03/12/2019  Does Patient Have a Medical Advance Directive? Yes  Would patient like information on creating a medical advance directive? -     No chief complaint on file.   HPI: Patient is a 58 y.o. female seen in today for an annual wellness exam.    Exercise? None   Dentition:has not followed up with dentist information has been provided.   Ophthalmology appt: routinely  went yesterday  Asthma- stable at this time, continues to need albuterol- some weeks are worse than others. Has use 3 times this week.   Allergic rhinitis- using flonase and zyrtec   Neck pain- went to neurosurgery due to paresthesias awaiting records  She had colonoscopy in 06/2017 - several precancerous polyps (tubular adenomas) removed and some hyperplastic polyps. She is due for repeat colonoscopy in 2021  htn- continues on zebeta, hydralazine, and amlodipine. Has cut back on her sodium.   Scared to go in and have her teeth checked out   Depression screen [facility] 2/9 03/12/2019 03/10/2018 02/12/2017 02/07/2017 02/07/2016  Decreased Interest 0 0 0 0 0  Down, Depressed, Hopeless 0 0 0 0 0  PHQ - 2 Score 0 0 0 0 0    Fall Risk  03/12/2019 09/30/2018 09/15/2018 03/10/2018 06/06/2017  Falls in the past year? 0 0 0 No No  Number falls in past yr: - 0 0 - -  Injury with Fall? 0 0 0 - -  Comment - - - - -   MMSE - Mini Mental State Exam 02/07/2017  Not completed: (No Data)     Health Maintenance  Topic Date Due  . Hepatitis C Screening  1961/01/27  . PAP SMEAR-Modifier  06/23/2017  . INFLUENZA VACCINE  03/06/2019  . COLONOSCOPY  06/19/2020  . MAMMOGRAM  11/04/2020  . TETANUS/TDAP  07/23/2027  . HIV Screening  Completed    Past Medical History:  Diagnosis Date  . Allergy   . Anemia   . Anxiety   . Asthma   . Environmental allergies   . GERD (gastroesophageal reflux disease)    Patient denies  . Headache(784.0)   . Hypertension   . Respiratory failure, acute ([place]) 04/19/2014  . Shortness of breath     Past Surgical History:  Procedure Laterality Date  . [place] SINUS SURGERY     Polyps    Social History   Socioeconomic History  . Marital status: Single    Spouse name: Not on file  . Number of children: Not on file  . Years of education:  Not on file  . Highest education level: Not on file  Occupational History  . Not on file  Social Needs  . Financial resource strain: Not hard at all  . Food insecurity    Worry: Never true    Inability: Never true  . Transportation needs    Medical: No    Non-medical: No  Tobacco Use  . Smoking status: Never Smoker  . Smokeless tobacco: Never Used  Substance and Sexual Activity  . Alcohol use: No  . Drug use: No  . Sexual activity: Never  Lifestyle  . Physical activity    Days per week: 0 days    Minutes per session: 0 min  . Stress: Only a little  Relationships  . Social connections    Talks on phone: More than  three times a week    Gets together: More than three times a week    Attends religious service: More than 4 times per year    Active member of club or organization: No    Attends meetings of clubs or organizations: Never    Relationship status: Never married  Other Topics Concern  . Not on file  Social History Narrative   Diet-Salt Free   Drinks [employer]    Lives in an apartment, one stories, one person in home, no pets   Current/past profession: Dietary, dish-room, and cooks    Exercises 2 x weekly                Family History  Problem Relation Age of Onset  . Heart attack Father   . Heart attack Mother   . Diabetes Sister   . High blood pressure Brother   . Hypertension Brother   . High blood pressure Sister   . Cancer Sister   . High blood pressure Son   . Cancer Maternal Grandmother   . Heart disease Paternal Grandmother   . Pancreatic cancer Paternal Grandmother   . Asthma Other        Great paternal grandfather   . Autism Son   . Asthma Other        Neice   . Colon cancer Neg Hx   . Esophageal cancer Neg Hx   . Rectal cancer Neg Hx   . Stomach cancer Neg Hx     Review of Systems:  Review of Systems  Constitutional: Negative for chills, fever, malaise/fatigue and weight loss.  HENT: Positive for congestion. Negative for ear discharge, ear pain, hearing loss, sinus pain, sore throat and tinnitus.        Poor dentition, can not afford dental care  Respiratory: Negative for cough, sputum production, shortness of breath, wheezing and stridor.   Cardiovascular: Negative for chest pain, palpitations, orthopnea and leg swelling.  Gastrointestinal: Negative for constipation and diarrhea.  Genitourinary: Negative for dysuria, frequency and urgency.  Musculoskeletal: Positive for myalgias and neck pain.  Neurological: Positive for tingling and sensory change. Negative for dizziness, focal weakness, seizures, loss of consciousness, weakness and headaches.   Psychiatric/Behavioral: Negative for depression. The patient is not nervous/anxious.      Allergies as of 03/12/2019      Reactions   Ace Inhibitors Other (See Comments)   Angioedema   Aspirin Anaphylaxis, Swelling, Other (See Comments)   Throat swelling   Ibuprofen Anaphylaxis, Swelling, Other (See Comments)   Throat swelling   Nsaids Anaphylaxis, Swelling, Other (See Comments)   Lipitor [atorvastatin] Swelling, Other (See Comments)   Face, feet swell  Medication List       Accurate as of March 12, 2019  9:22 AM. If you have any questions, ask your nurse or doctor.        acetaminophen 160 MG/5ML suspension Commonly known as: TYLENOL Take by mouth every 6 (six) hours as needed.   albuterol 108 (90 Base) MCG/ACT inhaler Commonly known as: Proventil HFA INHALE 2 PUFFS INTO THE LUNGS EVERY 6 HOURS AS NEEDED FOR WHEEZING OR SHORTNESS OF BREATH   amLODipine 10 MG tablet Commonly known as: NORVASC TAKE 1 TABLET BY MOUTH EVERY DAY FOR HIGH BLOOD PRESSURE   bisoprolol 5 MG tablet Commonly known as: ZEBETA TAKE 1/2 TABLET BY MOUTH EVERY DAY FOR HIGH BLOOD PRESSURE   cetirizine 10 MG tablet Commonly known as: ZYRTEC TAKE 1 TABLET(10 MG) BY MOUTH DAILY   Fish Oil 500 MG Caps Take 1 capsule by mouth daily.   fluticasone 50 MCG/ACT nasal spray Commonly known as: FLONASE SHAKE LIQUID AND USE 2 SPRAYS IN EACH NOSTRIL DAILY   Fluticasone-Salmeterol 250-50 MCG/DOSE Aepb Commonly known as: Wixela Inhub Inhale 2 puffs into the lungs 2 (two) times daily.   hydrALAZINE 25 MG tablet Commonly known as: APRESOLINE Take one tablet by mouth every 8 hours   triamcinolone 0.025 % ointment Commonly known as: KENALOG Apply 1 application topically 2 (two) times daily. For rash   XYZAL ALLERGY 24HR PO Take 1 tablet by mouth daily.         Physical Exam: Vitals:   03/12/19 0920  BP: 120/78  Pulse: 79  Resp: 20  Temp: 98.1 F (36.7 C)  TempSrc: Oral  SpO2: 95%   Weight: 162 lb 3.2 oz (73.6 kg)  Height: 4\' 11"  (1.499 m)   Body mass index is 32.76 kg/m. Wt Readings from Last 3 Encounters:  03/12/19 162 lb 3.2 oz (73.6 kg)  03/12/19 162 lb 3.2 oz (73.6 kg)  01/29/19 160 lb (72.6 kg)    Physical Exam Exam conducted with a chaperone present.  Constitutional:      General: She is not in acute distress.    Appearance: Normal appearance. She is well-developed.  HENT:     Head: Normocephalic and atraumatic.     Right Ear: Hearing, tympanic membrane, ear canal and external ear normal.     Left Ear: Hearing, tympanic membrane, ear canal and external ear normal.     Nose: Congestion and rhinorrhea present.     Mouth/Throat:     Dentition: Does not have dentures.     Pharynx: Uvula midline.     Comments: Poor dentition Eyes:     General: Lids are normal. No scleral icterus.    Conjunctiva/sclera: Conjunctivae normal.     Pupils: Pupils are equal, round, and reactive to light.  Neck:     Musculoskeletal: Normal range of motion and neck supple.     Thyroid: No thyroid mass or thyromegaly.     Vascular: No carotid bruit.     Trachea: Trachea normal.  Cardiovascular:     Rate and Rhythm: Normal rate and regular rhythm.     Heart sounds: Murmur (1/6 diastolic) present. No friction rub. No gallop.      Comments: No LE edema b/l. No calf TTP.  Pulmonary:     Effort: Pulmonary effort is normal.     Breath sounds: Decreased breath sounds present. No wheezing, rhonchi or rales.  Chest:     Breasts:        Right: Normal.  Left: Normal.  Abdominal:     General: Bowel sounds are normal. There is no distension.     Palpations: Abdomen is soft. Abdomen is not rigid. There is no mass or pulsatile mass.     Tenderness: There is no abdominal tenderness. There is no guarding or rebound.     Hernia: No hernia is present.  Genitourinary:    General: Normal vulva.     Urethra: No prolapse.     Vagina: Normal.  Musculoskeletal: Normal range of  motion.        General: No swelling or deformity.  Lymphadenopathy:     Head:     Right side of head: No posterior auricular adenopathy.     Left side of head: No posterior auricular adenopathy.     Cervical: No cervical adenopathy.  Skin:    General: Skin is warm and dry.     Nails: There is no clubbing.   Neurological:     Mental Status: She is alert and oriented to person, place, and time.     Cranial Nerves: No cranial nerve deficit.     Gait: Gait normal.     Deep Tendon Reflexes: Reflexes are normal and symmetric.  Psychiatric:        Speech: Speech normal.        Behavior: Behavior normal.        Thought Content: Thought content normal.     Labs reviewed: Basic Metabolic Panel: Recent Labs    09/15/18 1119  NA 141  K 3.6  CL 104  CO2 30  GLUCOSE 116*  BUN 7  CREATININE 0.91  CALCIUM 9.7   Liver Function Tests: Recent Labs    09/15/18 1119  AST 21  ALT 17  BILITOT 0.7  PROT 7.5   No results for input(s): LIPASE, AMYLASE in the last 8760 hours. No results for input(s): AMMONIA in the last 8760 hours. CBC: Recent Labs    09/15/18 1119  WBC 6.9  NEUTROABS 4,168  HGB 13.5  HCT 39.7  MCV 80.5  PLT 309   Lipid Panel: Recent Labs    09/15/18 1119  CHOL 167  HDL 43*  LDLCALC 95  TRIG 191*  CHOLHDL 3.9   Lab Results  Component Value Date   HGBA1C 6.1 (H) 09/15/2018    Procedures: No results found.  Assessment/Plan 1. Moderate persistent asthma without complication -stable, no recent flares, continues on fluticason-salmeterol 2 puffs twice daily with albuterol PRN - Pneumococcal polysaccharide vaccine 23-valent greater than or equal to 2yo subcutaneous/IM  2. Chronic seasonal allergic rhinitis Ongoing, continues on xyzal and flonase  3. Essential hypertension -controlled on current regimen, will continue current medication. - CBC with Differential/Platelet  4. Mixed hyperlipidemia -heart healthy diet encouraged - Lipid Panel -  COMPLETE METABOLIC PANEL WITH GFR  5. Preventative health care The patient was counseled regarding the appropriate use of alcohol, regular self-examination of the breasts on a monthly basis, prevention of dental and periodontal disease, diet, regular sustained exercise for at least 30 minutes 5 times per week as tolerates, routine screening interval for mammogram as recommended by the [place] and ACOG- has follow up scheduled for 6 months, importance of regular PAP smears,  tobacco use,  and recommended schedule for GI hemoccult testing, colonoscopy, cholesterol, thyroid and diabetes screening. -encouraged follow up with dentist and advised on complications of poor dentition.   6. Cervical cancer screening - PAP, Image Guided [[facility]]  7. Need for 23-polyvalent pneumococcal polysaccharide  vaccine - Pneumococcal polysaccharide vaccine 23-valent greater than or equal to 2yo IM   Next appt: 4 months.  Carlos American. [place], [facility] [phone]

## 2019-03-12 NOTE — Progress Notes (Signed)
Subjective:   Tammie [name] is a 58 y.o. female who presents for Medicare Annual (Subsequent) preventive examination.  Review of Systems:   Cardiac Risk Factors include: obesity (BMI >30kg/m2);sedentary lifestyle;hypertension;family history of premature cardiovascular disease     Objective:     Vitals: BP 120/78   Pulse 79   Temp 98.1 F (36.7 C) (Oral)   Resp 20   Ht 4\' 11"  (1.499 m)   Wt 162 lb 3.2 oz (73.6 kg)   SpO2 95%   BMI 32.76 kg/m   Body mass index is 32.76 kg/m.  Advanced Directives 03/12/2019 01/29/2019 09/30/2018 03/10/2018 06/19/2017 06/05/2017 02/12/2017  Does Patient Have a Medical Advance Directive? Yes No No No No No No  Would patient like information on creating a medical advance directive? - Yes (MAU/Ambulatory/Procedural Areas - Information given) Yes (MAU/Ambulatory/Procedural Areas - Information given) Yes (MAU/Ambulatory/Procedural Areas - Information given) No - Patient declined - Yes (MAU/Ambulatory/Procedural Areas - Information given)    Tobacco Social History   Tobacco Use  Smoking Status Never Smoker  Smokeless Tobacco Never Used     Counseling given: Not Answered   Clinical Intake:  Pre-visit preparation completed: Yes  Pain : No/denies pain     BMI - recorded: 32.76 Nutritional Status: BMI > 30  Obese Diabetes: No  How often do you need to have someone help you when you read instructions, pamphlets, or other written materials from your doctor or pharmacy?: 1 - Never What is the last grade level you completed in school?: 12th grade  Interpreter Needed?: No     Past Medical History:  Diagnosis Date  . Allergy   . Anemia   . Anxiety   . Asthma   . Environmental allergies   . GERD (gastroesophageal reflux disease)    Patient denies  . Headache(784.0)   . Hypertension   . Respiratory failure, acute ([place]) 04/19/2014  . Shortness of breath    Past Surgical History:  Procedure Laterality Date  . ABDOMINAL HYST[facility]CTOMY  1993    [facility]   . NASAL SINUS SURGERY     Polyps   Family History  Problem Relation Age of Onset  . Heart attack Father   . Heart attack Mother   . Diabetes Sister   . High blood pressure Brother   . Hypertension Brother   . High blood pressure Sister   . Cancer Sister   . High blood pressure Son   . Cancer Maternal Grandmother   . Heart disease Paternal Grandmother   . Pancreatic cancer Paternal Grandmother   . Asthma Other        Great paternal grandfather   . Autism Son   . Asthma Other        Neice   . Colon cancer Neg Hx   . Esophageal cancer Neg Hx   . Rectal cancer Neg Hx   . Stomach cancer Neg Hx    Social History   Socioeconomic History  . Marital status: Single    Spouse name: Not on file  . Number of children: Not on file  . Years of education: Not on file  . Highest education level: Not on file  Occupational History  . Not on file  Social Needs  . Financial resource strain: Not hard at all  . Food insecurity    Worry: Never true    Inability: Never true  . Transportation needs    Medical: No    Non-medical: No  Tobacco Use  .  Smoking status: Never Smoker  . Smokeless tobacco: Never Used  Substance and Sexual Activity  . Alcohol use: No  . Drug use: No  . Sexual activity: Never  Lifestyle  . Physical activity    Days per week: 0 days    Minutes per session: 0 min  . Stress: Only a little  Relationships  . Social connections    Talks on phone: More than three times a week    Gets together: More than three times a week    Attends religious service: More than 4 times per year    Active member of club or organization: No    Attends meetings of clubs or organizations: Never    Relationship status: Never married  Other Topics Concern  . Not on file  Social History Narrative   Diet-Salt Free   Drinks [employer]    Lives in an apartment, one stories, one person in home, no pets   Current/past profession: Dietary, dish-room, and cooks     Exercises 2 x weekly                Outpatient Encounter Medications as of 03/12/2019  Medication Sig  . acetaminophen (TYLENOL) 160 MG/5ML suspension Take by mouth every 6 (six) hours as needed.  Marland Kitchen albuterol (PROVENTIL HFA) 108 (90 Base) MCG/ACT inhaler INHALE 2 PUFFS INTO THE LUNGS EVERY 6 HOURS AS NEEDED FOR WHEEZING OR SHORTNESS OF BREATH  . amLODipine (NORVASC) 10 MG tablet TAKE 1 TABLET BY MOUTH EVERY DAY FOR HIGH BLOOD PRESSURE  . bisoprolol (ZEBETA) 5 MG tablet TAKE 1/2 TABLET BY MOUTH EVERY DAY FOR HIGH BLOOD PRESSURE  . cetirizine (ZYRTEC) 10 MG tablet TAKE 1 TABLET(10 MG) BY MOUTH DAILY  . fluticasone (FLONASE) 50 MCG/ACT nasal spray SHAKE LIQUID AND USE 2 SPRAYS IN EACH NOSTRIL DAILY  . Fluticasone-Salmeterol (WIXELA INHUB) 250-50 MCG/DOSE AEPB Inhale 2 puffs into the lungs 2 (two) times daily.  . hydrALAZINE (APRESOLINE) 25 MG tablet Take one tablet by mouth every 8 hours  . Levocetirizine Dihydrochloride (XYZAL ALLERGY 24HR PO) Take 1 tablet by mouth daily.  . Omega-3 Fatty Acids (FISH OIL) 500 MG CAPS Take 1 capsule by mouth daily.  Marland Kitchen triamcinolone (KENALOG) 0.025 % ointment Apply 1 application topically 2 (two) times daily. For rash  . [DISCONTINUED] triamcinolone (KENALOG) 0.025 % ointment Apply 1 application topically 2 (two) times daily. For rash   No facility-administered encounter medications on file as of 03/12/2019.     Activities of Daily Living In your present state of health, do you have any difficulty performing the following activities: 03/12/2019  Hearing? N  Vision? N  Difficulty concentrating or making decisions? N  Walking or climbing stairs? N  Dressing or bathing? N  Doing errands, shopping? N  Preparing Food and eating ? N  Using the Toilet? N  In the past six months, have you accidently leaked urine? N  Do you have problems with loss of bowel control? N  Managing your Medications? N  Managing your Finances? N  Housekeeping or managing your  Housekeeping? N  Some recent data might be hidden    Patient Care Team: [name], NP as PCP - General (Geriatric Medicine) [name], MD as Consulting Physician (Ophthalmology)    Assessment:   This is a routine wellness examination for Tammie [name].  Exercise Activities and Dietary recommendations Current Exercise Habits: The patient does not participate in regular exercise at present, Exercise limited by: respiratory conditions(s)  Goals    .  Exercise 3x per week (30 min per time)     Patient would like to start exercising 3 days a week.       Fall Risk Fall Risk  03/12/2019 09/30/2018 09/15/2018 03/10/2018 06/06/2017  Falls in the past year? 0 0 0 No No  Number falls in past yr: - 0 0 - -  Injury with Fall? 0 0 0 - -  Comment - - - - -   Is the patient's home free of loose throw rugs in walkways, pet beds, electrical cords, etc?   yes      Grab bars in the bathroom? no      Handrails on the stairs?   no stairs      Adequate lighting?   yes  Timed Get Up and Go performed: na  Depression Screen PHQ 2/9 Scores 03/12/2019 03/10/2018 02/12/2017 02/07/2017  PHQ - 2 Score 0 0 0 0     Cognitive Function MMSE - Mini Mental State Exam 02/07/2017  Not completed: (No Data)        Immunization History  Administered Date(s) Administered  . Influenza,inj,Quad PF,6+ Mos 05/26/2014, 06/21/2016, 05/27/2017, 05/19/2018  . Influenza-Unspecified 05/06/2015, 05/23/2017  . Pneumococcal Conjugate-13 12/17/2016  . Tdap 07/22/2017  . Zoster Recombinat (Shingrix) 12/17/2016    Qualifies for Shingles Vaccine?yes, need 2nd doduse  Screening Tests Health Maintenance  Topic Date Due  . Hepatitis C Screening  05/05/1961  . PAP SMEAR-Modifier  06/23/2017  . INFLUENZA VACCINE  03/06/2019  . COLONOSCOPY  06/19/2020  . MAMMOGRAM  11/04/2020  . TETANUS/TDAP  07/23/2027  . HIV Screening  Completed    Cancer Screenings: Lung: Low Dose CT Chest recommended if Age 68-80 years, 30  pack-year currently smoking OR have quit w/in 15years. Patient does not qualify. Breast:  Up to date on Mammogram? Yes   Up to date of Bone Density/Dexa? na Colorectal: up to date  Additional Screenings:  Hepatitis C Screening: na     Plan:     I have personally reviewed and noted the following in the patient's chart:   . Medical and social history . Use of alcohol, tobacco or illicit drugs  . Current medications and supplements . Functional ability and status . Nutritional status . Physical activity . Advanced directives . List of other physicians . Hospitalizations, surgeries, and ER visits in previous 12 months . Vitals . Screenings to include cognitive, depression, and falls . Referrals and appointments  In addition, I have reviewed and discussed with patient certain preventive protocols, quality metrics, and best practice recommendations. A written personalized care plan for preventive services as well as general preventive health recommendations were provided to patient.     [name], NP  03/12/2019

## 2019-03-12 NOTE — Patient Instructions (Signed)
DASH Eating Plan DASH stands for "Dietary Approaches to Stop Hypertension." The DASH eating plan is a healthy eating plan that has been shown to reduce high blood pressure (hypertension). It may also reduce your risk for type 2 diabetes, heart disease, and stroke. The DASH eating plan may also help with weight loss. What are tips for following this plan?  General guidelines  Avoid eating more than 2,300 mg (milligrams) of salt (sodium) a day. If you have hypertension, you may need to reduce your sodium intake to 1,500 mg a day.  Limit alcohol intake to no more than 1 drink a day for nonpregnant women and 2 drinks a day for men. One drink equals 12 oz of beer, 5 oz of wine, or 1 oz of hard liquor.  Work with your health care provider to maintain a healthy body weight or to lose weight. Ask what an ideal weight is for you.  Get at least 30 minutes of exercise that causes your heart to beat faster (aerobic exercise) most days of the week. Activities may include walking, swimming, or biking.  Work with your health care provider or diet and nutrition specialist (dietitian) to adjust your eating plan to your individual calorie needs. Reading food labels   Check food labels for the amount of sodium per serving. Choose foods with less than 5 percent of the Daily Value of sodium. Generally, foods with less than 300 mg of sodium per serving fit into this eating plan.  To find whole grains, look for the word "whole" as the first word in the ingredient list. Shopping  Buy products labeled as "low-sodium" or "no salt added."  Buy fresh foods. Avoid canned foods and premade or frozen meals. Cooking  Avoid adding salt when cooking. Use salt-free seasonings or herbs instead of table salt or sea salt. Check with your health care provider or pharmacist before using salt substitutes.  Do not fry foods. Cook foods using healthy methods such as baking, boiling, grilling, and broiling instead.  Cook  with heart-healthy oils, such as olive, canola, soybean, or sunflower oil. Meal planning  Eat a balanced diet that includes: ? 5 or more servings of fruits and vegetables each day. At each meal, try to fill half of your plate with fruits and vegetables. ? Up to 6-8 servings of whole grains each day. ? Less than 6 oz of lean meat, poultry, or fish each day. A 3-oz serving of meat is about the same size as a deck of cards. One egg equals 1 oz. ? 2 servings of low-fat dairy each day. ? A serving of nuts, seeds, or beans 5 times each week. ? Heart-healthy fats. Healthy fats called Omega-3 fatty acids are found in foods such as flaxseeds and coldwater fish, like sardines, salmon, and mackerel.  Limit how much you eat of the following: ? Canned or prepackaged foods. ? Food that is high in trans fat, such as fried foods. ? Food that is high in saturated fat, such as fatty meat. ? Sweets, desserts, sugary drinks, and other foods with added sugar. ? Full-fat dairy products.  Do not salt foods before eating.  Try to eat at least 2 vegetarian meals each week.  Eat more home-cooked food and less restaurant, buffet, and fast food.  When eating at a restaurant, ask that your food be prepared with less salt or no salt, if possible. What foods are recommended? The items listed may not be a complete list. Talk with your  dietitian about what dietary choices are best for you. Grains Whole-grain or whole-wheat bread. Whole-grain or whole-wheat pasta. Brown rice. [place]. Bulgur. Whole-grain and low-sodium cereals. Pita bread. Low-fat, low-sodium crackers. Whole-wheat flour tortillas. Vegetables Fresh or frozen vegetables (raw, steamed, roasted, or grilled). Low-sodium or reduced-sodium tomato and vegetable juice. Low-sodium or reduced-sodium tomato sauce and tomato paste. Low-sodium or reduced-sodium canned vegetables. Fruits All fresh, dried, or frozen fruit. Canned fruit in natural juice  (without added sugar). Meat and other protein foods Skinless chicken or [place]. Ground chicken or [place]. Pork with fat trimmed off. Fish and seafood. Egg whites. Dried beans, peas, or lentils. Unsalted nuts, nut butters, and seeds. Unsalted canned beans. Lean cuts of beef with fat trimmed off. Low-sodium, lean deli meat. Dairy Low-fat (1%) or fat-free (skim) milk. Fat-free, low-fat, or reduced-fat cheeses. Nonfat, low-sodium ricotta or cottage cheese. Low-fat or nonfat yogurt. Low-fat, low-sodium cheese. Fats and oils Soft margarine without trans fats. Vegetable oil. Low-fat, reduced-fat, or light mayonnaise and salad dressings (reduced-sodium). Canola, safflower, olive, soybean, and sunflower oils. Avocado. Seasoning and other foods Herbs. Spices. Seasoning mixes without salt. Unsalted popcorn and pretzels. Fat-free sweets. What foods are not recommended? The items listed may not be a complete list. Talk with your dietitian about what dietary choices are best for you. Grains Baked goods made with fat, such as croissants, muffins, or some breads. Dry pasta or rice meal packs. Vegetables Creamed or fried vegetables. Vegetables in a cheese sauce. Regular canned vegetables (not low-sodium or reduced-sodium). Regular canned tomato sauce and paste (not low-sodium or reduced-sodium). Regular tomato and vegetable juice (not low-sodium or reduced-sodium). Angie Fava. Olives. Fruits Canned fruit in a light or heavy syrup. Fried fruit. Fruit in cream or butter sauce. Meat and other protein foods Fatty cuts of meat. Ribs. Fried meat. Berniece Salines. Sausage. Bologna and other processed lunch meats. Salami. Fatback. Hotdogs. Bratwurst. Salted nuts and seeds. Canned beans with added salt. Canned or smoked fish. Whole eggs or egg yolks. Chicken or [place] with skin. Dairy Whole or 2% milk, cream, and half-and-half. Whole or full-fat cream cheese. Whole-fat or sweetened yogurt. Full-fat cheese. Nondairy creamers. Whipped  toppings. Processed cheese and cheese spreads. Fats and oils Butter. Stick margarine. Lard. Shortening. Ghee. Bacon fat. Tropical oils, such as coconut, palm kernel, or palm oil. Seasoning and other foods Salted popcorn and pretzels. Onion salt, garlic salt, seasoned salt, table salt, and sea salt. Worcestershire sauce. Tartar sauce. Barbecue sauce. Teriyaki sauce. Soy sauce, including reduced-sodium. Steak sauce. Canned and packaged gravies. Fish sauce. Oyster sauce. Cocktail sauce. Horseradish that you find on the shelf. Ketchup. Mustard. Meat flavorings and tenderizers. Bouillon cubes. Hot sauce and Tabasco sauce. Premade or packaged marinades. Premade or packaged taco seasonings. Relishes. Regular salad dressings. Where to find more information:  National Heart, Lung, and [place]: https://wilson-eaton.com/  American Heart Association: www.heart.org Summary  The DASH eating plan is a healthy eating plan that has been shown to reduce high blood pressure (hypertension). It may also reduce your risk for type 2 diabetes, heart disease, and stroke.  With the DASH eating plan, you should limit salt (sodium) intake to 2,300 mg a day. If you have hypertension, you may need to reduce your sodium intake to 1,500 mg a day.  When on the DASH eating plan, aim to eat more fresh fruits and vegetables, whole grains, lean proteins, low-fat dairy, and heart-healthy fats.  Work with your health care provider or diet and nutrition specialist (dietitian) to adjust your eating plan  to your individual calorie needs. This information is not intended to replace advice given to you by your health care provider. Make sure you discuss any questions you have with your health care provider. Document Released: 07/11/2011 Document Revised: 07/04/2017 Document Reviewed: 07/15/2016 Elsevier Patient Education  2020 [place] These Tests  Blood Pressure- Have your blood pressure checked by your healthcare  provider at least once a year.  Normal blood pressure is 120/80. Goal for most <140/90.  Weight- Have your body mass index (BMI) calculated to screen for obesity.  BMI is a measure of body fat based on height and weight.  You can calculate your own BMI at GravelBags.it  Cholesterol- Have your cholesterol checked every year.  Diabetes- Have your blood sugar checked every year if you have high blood pressure, high cholesterol, a family history of diabetes or if you are overweight.  Pap Test - Have a pap test every 1 to 5 years if you have been sexually active.  If you are older than 65 and recent pap tests have been normal you may not need additional pap tests.  In addition, if you have had a hysterectomy  for benign disease additional pap tests are not necessary.  Mammogram-Yearly mammograms are essential for early detection of breast cancer  Screening for Colon Cancer- Colonoscopy starting at age 76. Screening may begin sooner depending on your family history and other health conditions.  Follow up colonoscopy as directed by your Gastroenterologist.  Screening for Osteoporosis- Screening begins at age 47 with bone density scanning, sooner if you are at higher risk for developing Osteoporosis.   Get these medicines  Calcium with Vitamin D- Your body requires 1200-1500 mg of Calcium a day and [phone] IU of Vitamin D a day.  You can only absorb 500 mg of Calcium at a time therefore Calcium must be taken in 2 or 3 separate doses throughout the day.  Hormones- Hormone therapy has been associated with increased risk for certain cancers and heart disease.  Talk to your healthcare provider about if you need relief from menopausal symptoms.  Aspirin- Ask your healthcare provider about taking Aspirin to prevent Heart Disease and Stroke.   Get these Immuniztions  Flu shot- Every fall  Pneumonia shot- Once after the age of 69; if you are younger ask your healthcare provider if you need a  pneumonia shot.  Tetanus- Every ten years.  Shingrix- Once after the age of 73 to prevent shingles.   Take these steps  Don't smoke- Your healthcare provider can help you quit. For tips on how to quit, ask your healthcare provider or go to www.smokefree.gov or call 1-800 QUIT-NOW.  Be physically active- Exercise 5 days a week for a minimum of 30 minutes.  If you are not already physically active, start slow and gradually work up to 30 minutes of moderate physical activity.  Try walking, dancing, bike riding, swimming, etc.  Eat a healthy diet- Eat a variety of healthy foods such as fruits, vegetables, whole grains, low fat milk, low fat cheeses, yogurt, lean meats, chicken, fish, eggs, dried beans, tofu, etc.  For more information go to www.thenutritionsource.org  Dental visit- Brush and floss teeth twice daily; visit your dentist twice a year.  Eye exam- Visit your Optometrist or Ophthalmologist yearly.  Drink alcohol in moderation- Limit alcohol intake to one drink or less a day.  Never drink and drive.  Depression- Your emotional health is as important as your physical health.  If you're feeling down or losing interest in things you normally enjoy, please talk to your healthcare provider.  Seat Belts- can save your life; always wear one  Smoke/Carbon Monoxide detectors- These detectors need to be installed on the appropriate level of your home.  Replace batteries at least once a year.  Violence- If anyone is threatening or hurting you, please tell your healthcare provider.  Living Will/ Health care power of attorney- Discuss with your healthcare provider and family.

## 2019-03-12 NOTE — Patient Instructions (Signed)
Tammie [name] , Thank you for taking time to come for your Medicare Wellness Visit. I appreciate your ongoing commitment to your health goals. Please review the following plan we discussed and let me know if I can assist you in the future.   Screening recommendations/referrals: Colonoscopy up to date Mammogram follow up scheduled for October Bone Density na Recommended yearly ophthalmology/optometry visit for glaucoma screening and checkup Recommended yearly dental visit for hygiene and checkup  Vaccinations: Influenza vaccine  DUE- can get at pharmacy or come into office at the end of the month Pneumococcal vaccine due for pne 23 Tdap vaccine up to date Shingles vaccine need  2nd shingles shot, go to pharmacy and request  Advanced directives: to complete and bring back once they have been notorized.   Conditions/risks identified: obesity  Next appointment: 1 year.    Preventive Care 34 Years and Older, Female Preventive care refers to lifestyle choices and visits with your health care provider that can promote health and wellness. What does preventive care include?  A yearly physical exam. This is also called an annual well check.  Dental exams once or twice a year.  Routine eye exams. Ask your health care provider how often you should have your eyes checked.  Personal lifestyle choices, including:  Daily care of your teeth and gums.  Regular physical activity.  Eating a healthy diet.  Avoiding tobacco and drug use.  Limiting alcohol use.  Practicing safe sex.  Taking low-dose aspirin every day.  Taking vitamin and mineral supplements as recommended by your health care provider. What happens during an annual well check? The services and screenings done by your health care provider during your annual well check will depend on your age, overall health, lifestyle risk factors, and family history of disease. Counseling  Your health care provider may ask you questions  about your:  Alcohol use.  Tobacco use.  Drug use.  Emotional well-being.  Home and relationship well-being.  Sexual activity.  Eating habits.  History of falls.  Memory and ability to understand (cognition).  Work and work Statistician.  Reproductive health. Screening  You may have the following tests or measurements:  Height, weight, and BMI.  Blood pressure.  Lipid and cholesterol levels. These may be checked every 5 years, or more frequently if you are over 34 years old.  Skin check.  Lung cancer screening. You may have this screening every year starting at age 35 if you have a 30-pack-year history of smoking and currently smoke or have quit within the past 15 years.  Fecal occult blood test (FOBT) of the stool. You may have this test every year starting at age 39.  Flexible sigmoidoscopy or colonoscopy. You may have a sigmoidoscopy every 5 years or a colonoscopy every 10 years starting at age 63.  Hepatitis C blood test.  Hepatitis B blood test.  Sexually transmitted disease (STD) testing.  Diabetes screening. This is done by checking your blood sugar (glucose) after you have not eaten for a while (fasting). You may have this done every 1-3 years.  Bone density scan. This is done to screen for osteoporosis. You may have this done starting at age 89.  Mammogram. This may be done every 1-2 years. Talk to your health care provider about how often you should have regular mammograms. Talk with your health care provider about your test results, treatment options, and if necessary, the need for more tests. Vaccines  Your health care provider may recommend certain vaccines,  such as:  Influenza vaccine. This is recommended every year.  Tetanus, diphtheria, and acellular pertussis (Tdap, Td) vaccine. You may need a Td booster every 10 years.  Zoster vaccine. You may need this after age 66.  Pneumococcal 13-valent conjugate (PCV13) vaccine. One dose is  recommended after age 28.  Pneumococcal polysaccharide (PPSV23) vaccine. One dose is recommended after age 72. Talk to your health care provider about which screenings and vaccines you need and how often you need them. This information is not intended to replace advice given to you by your health care provider. Make sure you discuss any questions you have with your health care provider. Document Released: 08/18/2015 Document Revised: 04/10/2016 Document Reviewed: 05/23/2015 Elsevier Interactive Patient Education  2017 [place] Prevention in the Home Falls can cause injuries. They can happen to people of all ages. There are many things you can do to make your home safe and to help prevent falls. What can I do on the outside of my home?  Regularly fix the edges of walkways and driveways and fix any cracks.  Remove anything that might make you trip as you walk through a door, such as a raised step or threshold.  Trim any bushes or trees on the path to your home.  Use bright outdoor lighting.  Clear any walking paths of anything that might make someone trip, such as rocks or tools.  Regularly check to see if handrails are loose or broken. Make sure that both sides of any steps have handrails.  Any raised decks and porches should have guardrails on the edges.  Have any leaves, snow, or ice cleared regularly.  Use sand or salt on walking paths during winter.  Clean up any spills in your garage right away. This includes oil or grease spills. What can I do in the bathroom?  Use night lights.  Install grab bars by the toilet and in the tub and shower. Do not use towel bars as grab bars.  Use non-skid mats or decals in the tub or shower.  If you need to sit down in the shower, use a plastic, non-slip stool.  Keep the floor dry. Clean up any water that spills on the floor as soon as it happens.  Remove soap buildup in the tub or shower regularly.  Attach bath mats  securely with double-sided non-slip rug tape.  Do not have throw rugs and other things on the floor that can make you trip. What can I do in the bedroom?  Use night lights.  Make sure that you have a light by your bed that is easy to reach.  Do not use any sheets or blankets that are too big for your bed. They should not hang down onto the floor.  Have a firm chair that has side arms. You can use this for support while you get dressed.  Do not have throw rugs and other things on the floor that can make you trip. What can I do in the kitchen?  Clean up any spills right away.  Avoid walking on wet floors.  Keep items that you use a lot in easy-to-reach places.  If you need to reach something above you, use a strong step stool that has a grab bar.  Keep electrical cords out of the way.  Do not use floor polish or wax that makes floors slippery. If you must use wax, use non-skid floor wax.  Do not have throw rugs and other things on  the floor that can make you trip. What can I do with my stairs?  Do not leave any items on the stairs.  Make sure that there are handrails on both sides of the stairs and use them. Fix handrails that are broken or loose. Make sure that handrails are as long as the stairways.  Check any carpeting to make sure that it is firmly attached to the stairs. Fix any carpet that is loose or worn.  Avoid having throw rugs at the top or bottom of the stairs. If you do have throw rugs, attach them to the floor with carpet tape.  Make sure that you have a light switch at the top of the stairs and the bottom of the stairs. If you do not have them, ask someone to add them for you. What else can I do to help prevent falls?  Wear shoes that:  Do not have high heels.  Have rubber bottoms.  Are comfortable and fit you well.  Are closed at the toe. Do not wear sandals.  If you use a stepladder:  Make sure that it is fully opened. Do not climb a closed  stepladder.  Make sure that both sides of the stepladder are locked into place.  Ask someone to hold it for you, if possible.  Clearly mark and make sure that you can see:  Any grab bars or handrails.  First and last steps.  Where the edge of each step is.  Use tools that help you move around (mobility aids) if they are needed. These include:  Canes.  Walkers.  Scooters.  Crutches.  Turn on the lights when you go into a dark area. Replace any light bulbs as soon as they burn out.  Set up your furniture so you have a clear path. Avoid moving your furniture around.  If any of your floors are uneven, fix them.  If there are any pets around you, be aware of where they are.  Review your medicines with your doctor. Some medicines can make you feel dizzy. This can increase your chance of falling. Ask your doctor what other things that you can do to help prevent falls. This information is not intended to replace advice given to you by your health care provider. Make sure you discuss any questions you have with your health care provider. Document Released: 05/18/2009 Document Revised: 12/28/2015 Document Reviewed: 08/26/2014 Elsevier Interactive Patient Education  2017 [employer].

## 2019-03-13 LAB — LIPID PANEL
Cholesterol: 147 mg/dL (ref <200)
HDL: 36 mg/dL — ABNORMAL LOW (ref >=50)
LDL Cholesterol (Calc): 79 mg/dL (calc)
Non-HDL Cholesterol (Calc): 111 mg/dL (calc) (ref <130)
Total CHOL/HDL Ratio: 4.1 (calc) (ref <5.0)
Triglycerides: 230 mg/dL — ABNORMAL HIGH (ref <150)

## 2019-03-13 LAB — COMPLETE METABOLIC PANEL WITH GFR
AG Ratio: 1.2 (calc) (ref 1.0–2.5)
ALT: 27 U/L (ref 6–29)
AST: 26 U/L (ref 10–35)
Albumin: 3.9 g/dL (ref 3.6–5.1)
Alkaline phosphatase (APISO): 115 U/L (ref 37–153)
BUN: 8 mg/dL (ref 7–25)
CO2: 30 mmol/L (ref 20–32)
Calcium: 9.6 mg/dL (ref 8.6–10.4)
Chloride: 101 mmol/L (ref 98–110)
Creat: 0.98 mg/dL (ref 0.50–0.99)
GFR, Est African American: 73 mL/min/{1.73_m2} (ref >=60)
GFR, Est Non African American: 63 mL/min/{1.73_m2} (ref >=60)
Globulin: 3.3 g/dL (calc) (ref 1.9–3.7)
Glucose, Bld: 117 mg/dL — ABNORMAL HIGH (ref 65–99)
Potassium: 3.4 mmol/L — ABNORMAL LOW (ref 3.5–5.3)
Sodium: 143 mmol/L (ref 135–146)
Total Bilirubin: 0.8 mg/dL (ref 0.2–1.2)
Total Protein: 7.2 g/dL (ref 6.1–8.1)

## 2019-03-13 LAB — CBC WITH DIFFERENTIAL/PLATELET
Absolute Monocytes: 499 cells/uL (ref 200–950)
Basophils Absolute: 70 cells/uL (ref 0–200)
Basophils Relative: 1.1 %
Eosinophils Absolute: 659 cells/uL — ABNORMAL HIGH (ref 15–500)
Eosinophils Relative: 10.3 %
HCT: 43.1 % (ref 35.0–45.0)
Hemoglobin: 14.1 g/dL (ref 11.7–15.5)
Lymphs Abs: 1818 cells/uL (ref 850–3900)
MCH: 26.4 pg — ABNORMAL LOW (ref 27.0–33.0)
MCHC: 32.7 g/dL (ref 32.0–36.0)
MCV: 80.7 fL (ref 80.0–100.0)
MPV: 10 fL (ref 7.5–12.5)
Monocytes Relative: 7.8 %
Neutro Abs: 3354 cells/uL (ref 1500–7800)
Neutrophils Relative %: 52.4 %
Platelets: 326 10*3/uL (ref 140–400)
RBC: 5.34 10*6/uL — ABNORMAL HIGH (ref 3.80–5.10)
RDW: 14 % (ref 11.0–15.0)
Total Lymphocyte: 28.4 %
WBC: 6.4 10*3/uL (ref 3.8–10.8)

## 2019-03-15 LAB — PAP IG (IMAGE GUIDED)

## 2019-03-16 ENCOUNTER — Other Ambulatory Visit: Payer: Self-pay | Admitting: Nurse Practitioner

## 2019-03-16 MED ORDER — FLUCONAZOLE 150 MG PO TABS: 150.0000 mg | ORAL_TABLET | Freq: Once | ORAL | 0 refills | Status: AC

## 2019-03-17 ENCOUNTER — Telehealth: Payer: Self-pay

## 2019-03-17 DIAGNOSIS — J302 Other seasonal allergic rhinitis: Secondary | ICD-10-CM

## 2019-03-17 NOTE — Telephone Encounter (Signed)
Discussed results with patient, patient verbalized understanding of results  1.) Patient refused referral to dietician at this time   2.) Patient was fasting at the time of labs, add on will be given to [facility] tech.  3.) Rx already sent to pharmacy by [name], NP   4.) Patient would like referral to allergist (referral pended for [name], NP to associate and sign)   5.) Copy of labs mailed

## 2019-03-17 NOTE — Telephone Encounter (Signed)
-----   Message from [name], NP sent at 03/16/2019  3:55 PM EDT ----- Blood sugar elevated, can we add on A1c? Cholesterol with elevated triglycerides which could be related to blood sugars. Recommend low triglyceride, diabetic diet. We can also put in a referral to a dietitian if she needs help with her diet. Potassium also slightly low, recommend eating foods high in potassium such as sweet potato, banana and oranges.   PAP came back with indications of yeast, will send in Rx to pharmacy for diflucan that she will take 1 tablet to help with this.   Blood count came back with elevated eosinophils, she already sees pulmonary due to asthma but we can refer her to an allergies due to chronic congestion if she would like

## 2019-03-18 NOTE — Telephone Encounter (Signed)
Done

## 2019-03-26 ENCOUNTER — Other Ambulatory Visit: Payer: Self-pay | Admitting: Nurse Practitioner

## 2019-03-26 DIAGNOSIS — J4540 Moderate persistent asthma, uncomplicated: Secondary | ICD-10-CM

## 2019-03-31 ENCOUNTER — Other Ambulatory Visit: Payer: Self-pay | Admitting: Nurse Practitioner

## 2019-03-31 DIAGNOSIS — I10 Essential (primary) hypertension: Secondary | ICD-10-CM

## 2019-04-06 ENCOUNTER — Ambulatory Visit: Payer: Medicare HMO | Admitting: Allergy & Immunology

## 2019-04-06 ENCOUNTER — Other Ambulatory Visit: Payer: Self-pay

## 2019-04-06 ENCOUNTER — Encounter: Payer: Self-pay | Admitting: Allergy & Immunology

## 2019-04-06 VITALS — BP 150/76 | HR 103 | Temp 98.0°F | Resp 18 | Ht <= 58 in | Wt 161.0 lb

## 2019-04-06 DIAGNOSIS — J45909 Unspecified asthma, uncomplicated: Secondary | ICD-10-CM

## 2019-04-06 DIAGNOSIS — J339 Nasal polyp, unspecified: Secondary | ICD-10-CM

## 2019-04-06 DIAGNOSIS — J4540 Moderate persistent asthma, uncomplicated: Secondary | ICD-10-CM | POA: Diagnosis not present

## 2019-04-06 DIAGNOSIS — J310 Chronic rhinitis: Secondary | ICD-10-CM

## 2019-04-06 DIAGNOSIS — Z886 Allergy status to analgesic agent status: Secondary | ICD-10-CM | POA: Insufficient documentation

## 2019-04-06 DIAGNOSIS — T39015A Adverse effect of aspirin, initial encounter: Secondary | ICD-10-CM | POA: Diagnosis not present

## 2019-04-06 DIAGNOSIS — J984 Other disorders of lung: Secondary | ICD-10-CM | POA: Diagnosis not present

## 2019-04-06 MED ORDER — TRIAMCINOLONE ACETONIDE 0.5 % EX OINT: 1.0000 "application " | TOPICAL_OINTMENT | Freq: Two times a day (BID) | CUTANEOUS | 3 refills | Status: DC

## 2019-04-06 NOTE — Progress Notes (Signed)
NEW PATIENT  Date of Service/Encounter:  04/06/19  Referring provider: [name], NP   Assessment:   Moderate persistent asthma, uncomplicated  Chronic rhinitis  Disorder of respiratory system exacerbated by aspirin  Nasal polyposis  Plan/Recommendations:   1. Moderate persistent asthma, uncomplicated - Lung testing looked low today, but they improved with the albuterol puffs. - We are going to continue with the Wixela one puff twice daily as you are doing now. - Continue with albuterol two puffs as needed.  - Information on Dupixent provided. - Tammy will reach out for discussion on Dupixent.  2. Chronic rhinitis with nasal polyps - You have a left sided nasal polyp (extra growth in your nose, usually from uncontrolled allergies). - We are going to get some blood work to look for environmental allergies. - We will call you in 1-2 weeks with the results of the testing.  - In the meantime, continue with the fluticasone and the cetirizine as you are doing.  3. Eczema versus contact dermatitis  - The Dupixent might help with the rash as well. - I am going to send in a stronger topical steroid to see if this helps with the rash.   4. Return in about 4 weeks (around 05/04/2019). This can be an in-person follow up visit.   Subjective:   Tammie [name] is a 58 y.o. female presenting today for evaluation of  Chief Complaint  Patient presents with  . Asthma  . Allergic Rhinitis     Tammie [name] has a history of the following: Patient Active Problem List   Diagnosis Date Noted  . Chronic rhinitis 04/06/2019  . Disorder of respiratory system exacerbated by aspirin 04/06/2019  . Nasal polyposis 04/06/2019  . Moderate persistent asthma, uncomplicated [id number]  . Generalized anxiety disorder 09/25/2016  . Chronic seasonal allergic rhinitis 09/25/2016  . Aortic valve regurgitation 09/25/2016  . Asthma, chronic 04/26/2014  . Other dysphagia 04/26/2014  .  Status asthmaticus 04/18/2014  . Hypertension 04/18/2014  . Sinus tachycardia 04/18/2014  . Delirium 04/18/2014  . Anaphylaxis 04/15/2014   [phone] (PATIENT CELL PHONE)  History obtained from: chart review and patient.  Tammie [name] was referred by [name], NP.     Pulmonologist: Dr. Willesha Aquino is a 58 y.o. female presenting for an evaluation of elevated eosinophils and concern for allergies.  It seems that she had a complete blood count sent by her pulmonologist which demonstrated an absolute eosinophil count of 659.  This prompted a referral to [place] for an evaluation.   Asthma/Respiratory Symptom History: She does have a history of asthma. She does have a history of multiple hospitalizations and ED visits. She was intubated during the hospitalization. She is currently on Wixela 250/50 one puff twice daily and albuterol as needed. She is using her albuterol twice daily. She was noted to have elevated eosinophils. She tends to use her albuterol only as needed. She was never discussed starting a biologic for her asthma. Her father smoked, but otherwise she is not a smoker.   Allergic Rhinitis Symptom History: She is on fluticasone as well as cetirizine daily. This works fairly well to cont thickening of the maxillary sinus bilaterally.  Rol her symptoms. She does not get antibiotics for sinus infections. She has never been allergy tested to her knowledge.    Eczema Symptom History: She does have a rash on her left lower extremity where it touches her shoe.  She has been given triamcinolone 0.025% ointment twice  daily, which she has been using for a year with somewhat improved results.  She has never seen a dermatologist.   She does have a known aspirin sensitivity.  For pain she will use Tylenol only.  Otherwise, she develops breathing problems and congestion.   She does have a history of nasal polyposis. She has seen Dr. Redmond Baseman in the past, last visit in April 2017.  At  that time, surgery was discussed but it does not seem that this was ever performed.  She had a sinus CT in April 2017 that demonstrated complete opacification of the paranasal sinuses and nasal cavity due to polyposis and sinus obstruction.  There was also chronic thickening of the maxillary sinus bilaterally.  Full results below.     She was working at one point and had some passing out episodes. This was attributed to heart issues. She does have a history of grade 1 diastolic dysfunction with mild left ventricular hypertrophy, with her most recent echocardiogram in August 2019.   Otherwise, there is no history of other atopic diseases, including food allergies, stinging insect allergies, urticaria or contact dermatitis. There is no significant infectious history. Vaccinations are up to date.    Past Medical History: Patient Active Problem List   Diagnosis Date Noted  . Chronic rhinitis 04/06/2019  . Disorder of respiratory system exacerbated by aspirin 04/06/2019  . Nasal polyposis 04/06/2019  . Moderate persistent asthma, uncomplicated [id number]  . Generalized anxiety disorder 09/25/2016  . Chronic seasonal allergic rhinitis 09/25/2016  . Aortic valve regurgitation 09/25/2016  . Asthma, chronic 04/26/2014  . Other dysphagia 04/26/2014  . Status asthmaticus 04/18/2014  . Hypertension 04/18/2014  . Sinus tachycardia 04/18/2014  . Delirium 04/18/2014  . Anaphylaxis 04/15/2014    Medication List:  Allergies as of 04/06/2019      Reactions   Ace Inhibitors Other (See Comments)   Angioedema   Aspirin Anaphylaxis, Swelling, Other (See Comments)   Throat swelling   Ibuprofen Anaphylaxis, Swelling, Other (See Comments)   Throat swelling   Nsaids Anaphylaxis, Swelling, Other (See Comments)   Lipitor [atorvastatin] Swelling, Other (See Comments)   Face, feet swell      Medication List       Accurate as of April 06, 2019  4:22 PM. If you have any questions, ask your nurse or  doctor.        STOP taking these medications   triamcinolone 0.025 % ointment Commonly known as: KENALOG Replaced by: triamcinolone ointment 0.5 % Stopped by: Valentina Shaggy, MD   Harlow Ohms ALLERGY 24HR PO Stopped by: Valentina Shaggy, MD     TAKE these medications   acetaminophen 160 MG/5ML suspension Commonly known as: TYLENOL Take by mouth every 6 (six) hours as needed.   albuterol 108 (90 Base) MCG/ACT inhaler Commonly known as: VENTOLIN HFA INHALE 2 PUFFS INTO THE LUNGS EVERY 6 HOURS AS NEEDED FOR WHEEZING OR SHORTNESS OF BREATH   amLODipine 10 MG tablet Commonly known as: NORVASC TAKE 1 TABLET BY MOUTH EVERY DAY FOR HIGH BLOOD PRESSURE   bisoprolol 5 MG tablet Commonly known as: ZEBETA TAKE 1/2 TABLET BY MOUTH EVERY DAY FOR HIGH BLOOD PRESSURE   cetirizine 10 MG tablet Commonly known as: ZYRTEC TAKE 1 TABLET(10 MG) BY MOUTH DAILY   Fish Oil 500 MG Caps Take 1 capsule by mouth daily.   fluticasone 50 MCG/ACT nasal spray Commonly known as: FLONASE SHAKE LIQUID AND USE 2 SPRAYS IN EACH NOSTRIL DAILY   Fluticasone-Salmeterol 250-50 MCG/DOSE  Aepb Commonly known as: Wixela Inhub Inhale 2 puffs into the lungs 2 (two) times daily.   hydrALAZINE 25 MG tablet Commonly known as: APRESOLINE Take one tablet by mouth every 8 hours   triamcinolone ointment 0.5 % Commonly known as: KENALOG Apply 1 application topically 2 (two) times daily. Replaces: triamcinolone 0.025 % ointment Started by: Valentina Shaggy, MD       Birth History: non-contributory  Developmental History: non-contributory  Past Surgical History: Past Surgical History:  Procedure Laterality Date  . ABDOMINAL HYSTERECTOMY  1993   [facility]   . NASAL SINUS SURGERY     Polyps     Family History: Family History  Problem Relation Age of Onset  . Heart attack Father   . Heart attack Mother   . Diabetes Sister   . High blood pressure Brother   . Hypertension Brother   .  High blood pressure Sister   . Cancer Sister   . High blood pressure Son   . Cancer Maternal Grandmother   . Heart disease Paternal Grandmother   . Pancreatic cancer Paternal Grandmother   . Asthma Other        Great paternal grandfather   . Autism Son   . Asthma Other        Neice   . Colon cancer Neg Hx   . Esophageal cancer Neg Hx   . Rectal cancer Neg Hx   . Stomach cancer Neg Hx      Social History: Keiyana lives at home.  She lives in a house that is 60 years old.  There are rugs throughout the home.  She has a combination of gas and electric heat with window units for cooling.  There are no animals inside or outside of the home.  She does not have dust mite coverings on her bedding.  There is no tobacco exposure.  She is currently on disability for the past 3 to 5 years.   Review of Systems  Constitutional: Negative.  Negative for chills, fever, malaise/fatigue and weight loss.  HENT: Positive for congestion, ear pain and tinnitus. Negative for ear discharge, sinus pain and sore throat.   Eyes: Negative for pain, discharge and redness.  Respiratory: Positive for cough, sputum production and wheezing. Negative for shortness of breath.   Cardiovascular: Negative.  Negative for chest pain and palpitations.  Gastrointestinal: Negative for abdominal pain, constipation, diarrhea, heartburn, nausea and vomiting.  Skin: Negative.  Negative for itching and rash.  Neurological: Negative for dizziness and headaches.  Endo/Heme/Allergies: Positive for environmental allergies. Does not bruise/bleed easily.       Objective:   Blood pressure (!) 150/76, pulse (!) 103, temperature 98 F (36.7 C), temperature source Temporal, resp. rate 18, height 4\' 9"  (1.448 m), weight 161 lb (73 kg), SpO2 90 %. Body mass index is 34.84 kg/m.   Physical Exam:   Physical Exam  Constitutional: She appears well-developed.  HENT:  Head: Normocephalic and atraumatic.  Right Ear: Tympanic membrane,  external ear and ear canal normal. No drainage, swelling or tenderness. Tympanic membrane is not injected, not scarred, not erythematous, not retracted and not bulging.  Left Ear: Tympanic membrane, external ear and ear canal normal. No drainage, swelling or tenderness. Tympanic membrane is not injected, not scarred, not erythematous, not retracted and not bulging.  Nose: No mucosal edema, rhinorrhea, nasal deformity or septal deviation. No epistaxis. Right sinus exhibits no maxillary sinus tenderness and no frontal sinus tenderness. Left sinus exhibits no maxillary  sinus tenderness and no frontal sinus tenderness.  Mouth/Throat: Uvula is midline and oropharynx is clear and moist. Mucous membranes are not pale and not dry.  Eyes: Pupils are equal, round, and reactive to light. Conjunctivae and EOM are normal. Right eye exhibits no chemosis and no discharge. Left eye exhibits no chemosis and no discharge. Right conjunctiva is not injected. Left conjunctiva is not injected.  Cardiovascular: Normal rate, regular rhythm and normal heart sounds.  Respiratory: Effort normal and breath sounds normal. No accessory muscle usage. No tachypnea. No respiratory distress. She has no wheezes. She has no rhonchi. She has no rales. She exhibits no tenderness.  GI: There is no abdominal tenderness. There is no rebound and no guarding.  Lymphadenopathy:       Head (right side): No submandibular, no tonsillar and no occipital adenopathy present.       Head (left side): No submandibular, no tonsillar and no occipital adenopathy present.    She has no cervical adenopathy.  Neurological: She is alert.  Skin: No abrasion, no petechiae and no rash noted. Rash is not papular, not vesicular and not urticarial. No erythema. No pallor.  Psychiatric: She has a normal mood and affect.     Diagnostic studies:    Spirometry: results abnormal (FEV1: 0.92/61%, FVC: 1.22/64%, FEV1/FVC: 75%).    Spirometry consistent with possible  restrictive disease. Albuterol four puffs treatment given in clinic with no improvement.  Allergy Studies: none      Salvatore Marvel, MD [facility]

## 2019-04-06 NOTE — Patient Instructions (Addendum)
1. Moderate persistent asthma, uncomplicated - Lung testing looked low today, but they improved with the albuterol puffs. - We are going to continue with the Wixela one puff twice daily as you are doing now. - Continue with albuterol two puffs as needed.  - Information on Dupixent provided. - Tammy will reach out for discussion on Dupixent.  2. Chronic rhinitis with nasal polyps - You have a left sided nasal polyp (extra growth in your nose, usually from uncontrolled allergies). - We are going to get some blood work to look for environmental allergies. - We will call you in 1-2 weeks with the results of the testing.  - In the meantime, continue with the fluticasone and the cetirizine as you are doing.  3. Eczema versus contact dermatitis  - The Dupixent might help with the rash as well. - I am going to send in a stronger topical steroid to see if this helps with the rash.   4. Return in about 4 weeks (around 05/04/2019). This can be an in-person follow up visit.   Please inform us of any Emergency Department visits, hospitalizations, or changes in symptoms. Call us before going to the ED for breathing or allergy symptoms since we might be able to fit you in for a sick visit. Feel free to contact us anytime with any questions, problems, or concerns.  It was a pleasure to meet you today!  Websites that have reliable patient information: 1. American Academy of Asthma, Allergy, and Immunology: www.aaaai.org 2. Food Allergy Research and Education (FARE): foodallergy.org 3. Mothers of Asthmatics: http://www.asthmacommunitynetwork.org 4. American College of Allergy, Asthma, and Immunology: www.acaai.org  "Like" [place] on Facebook and Instagram for our latest updates!      Make sure you are registered to vote! If you have moved or changed any of your contact information, you will need to get this updated before voting!  In some cases, you MAY be able to register to vote online:  [email or web address].[email or web address]    Voter ID laws are NOT going into effect for the General Election in November 2020! DO NOT let this stop you from exercising your right to vote!   Absentee voting is the SAFEST way to vote during the coronavirus pandemic!   Download and print an absentee ballot request form at rebrand.[email or web address]/GCO-Ballot-Request or you can scan the QR code below with your smart phone:      More information on absentee ballots can be found here: [email or web address]://[email or web address]-[email or web address]  [place] VOTING SITES have been established! You can register to vote and cast your vote on the same day at these locations. See this site for more information on what you need to register to vote: [email or web address]

## 2019-04-09 ENCOUNTER — Telehealth: Payer: Self-pay | Admitting: *Deleted

## 2019-04-09 NOTE — Telephone Encounter (Signed)
-----   Message from Valentina Shaggy, MD sent at 04/06/2019  4:27 PM EDT ----- New start [place]! I put her cell number in the note itself. She is awaiting a call.

## 2019-04-09 NOTE — Telephone Encounter (Signed)
Called patient and discussed starting [place] for her eczema, nasal polyps and asthma. I did get approval and advised patient that I will be sending Rx to [facility] and they will contact her. Also she should put Rx in fridge upon arrival and reach out to clinic to make appt to start therapy.

## 2019-04-18 LAB — ASPERGILLUS PRECIPITINS
A.Fumigatus #1 Abs: NEGATIVE
Aspergillus Flavus Antibodies: NEGATIVE
Aspergillus Niger Antibodies: NEGATIVE
Aspergillus glaucus IgG: NEGATIVE
Aspergillus nidulans IgG: NEGATIVE
Aspergillus terreus IgG: NEGATIVE

## 2019-04-18 LAB — ALLERGEN PROFILE, MOLD
Aureobasidi Pullulans IgE: <0.1 kU/L
Candida Albicans IgE: 0.1 kU/L — AB
M009-IgE Fusarium proliferatum: <0.1 kU/L
M014-IgE Epicoccum purpur: <0.1 kU/L
Phoma Betae IgE: <0.1 kU/L
Setomelanomma Rostrat: <0.1 kU/L

## 2019-04-18 LAB — IGE+ALLERGENS ZONE 2(30)
Alternaria Alternata IgE: <0.1 kU/L
Amer Sycamore IgE Qn: <0.1 kU/L
Aspergillus Fumigatus IgE: <0.1 kU/L
Bahia Grass IgE: 2.23 kU/L — AB
Bermuda Grass IgE: 1.07 kU/L — AB
Cat Dander IgE: <0.1 kU/L
Cedar, Mountain IgE: <0.1 kU/L
Cladosporium Herbarum IgE: <0.1 kU/L
Cockroach, American IgE: <0.1 kU/L
Common Silver Birch IgE: <0.1 kU/L
D Farinae IgE: <0.1 kU/L
D Pteronyssinus IgE: <0.1 kU/L
Dog Dander IgE: <0.1 kU/L
Elm, American IgE: <0.1 kU/L
Hickory, White IgE: <0.1 kU/L
IgE (Immunoglobulin E), Serum: 224 IU/mL (ref 6–495)
Johnson Grass IgE: 1.87 kU/L — AB
Maple/Box Elder IgE: <0.1 kU/L
Mucor Racemosus IgE: <0.1 kU/L
Mugwort IgE Qn: <0.1 kU/L
Nettle IgE: <0.1 kU/L
Oak, White IgE: <0.1 kU/L
Penicillium Chrysogen IgE: <0.1 kU/L
Pigweed, Rough IgE: <0.1 kU/L
Plantain, English IgE: <0.1 kU/L
Ragweed, Short IgE: <0.1 kU/L
Sheep Sorrel IgE Qn: <0.1 kU/L
Stemphylium Herbarum IgE: <0.1 kU/L
Sweet gum IgE RAST Ql: <0.1 kU/L
Timothy Grass IgE: 3.03 kU/L — AB
White Mulberry IgE: <0.1 kU/L

## 2019-04-18 LAB — ALPHA-1-ANTITRYPSIN: A-1 Antitrypsin: 142 mg/dL (ref 101–187)

## 2019-04-19 ENCOUNTER — Other Ambulatory Visit: Payer: Self-pay

## 2019-04-19 ENCOUNTER — Telehealth: Payer: Self-pay

## 2019-04-19 MED ORDER — CETIRIZINE HCL 10 MG PO TABS: ORAL_TABLET | ORAL | 0 refills | Status: DC

## 2019-04-19 NOTE — Telephone Encounter (Signed)
30 day supply of zyrtec will be sent and then patient will call to request 90 day supply.

## 2019-05-03 ENCOUNTER — Ambulatory Visit: Payer: Medicare HMO | Admitting: Nurse Practitioner

## 2019-05-10 ENCOUNTER — Ambulatory Visit
Admission: RE | Admit: 2019-05-10 | Discharge: 2019-05-10 | Disposition: A | Discharge status: Home / Self Care | Payer: Medicare HMO | Source: Ambulatory Visit | Attending: Nurse Practitioner | Admitting: Nurse Practitioner

## 2019-05-10 ENCOUNTER — Ambulatory Visit
Admission: RE | Admit: 2019-05-10 | Discharge: 2019-05-10 | Disposition: A | Discharge status: Home / Self Care | Payer: Medicare HMO | Source: Ambulatory Visit

## 2019-05-10 ENCOUNTER — Other Ambulatory Visit: Payer: Self-pay

## 2019-05-10 DIAGNOSIS — N63 Unspecified lump in unspecified breast: Secondary | ICD-10-CM

## 2019-05-10 DIAGNOSIS — N6321 Unspecified lump in the left breast, upper outer quadrant: Secondary | ICD-10-CM | POA: Diagnosis not present

## 2019-05-10 DIAGNOSIS — N6312 Unspecified lump in the right breast, upper inner quadrant: Secondary | ICD-10-CM | POA: Diagnosis not present

## 2019-05-11 ENCOUNTER — Ambulatory Visit: Payer: Medicare HMO | Admitting: Allergy & Immunology

## 2019-05-11 ENCOUNTER — Encounter: Payer: Self-pay | Admitting: Allergy & Immunology

## 2019-05-11 VITALS — BP 104/60 | HR 85 | Temp 98.5°F | Resp 20

## 2019-05-11 DIAGNOSIS — T39015A Adverse effect of aspirin, initial encounter: Secondary | ICD-10-CM

## 2019-05-11 DIAGNOSIS — Z886 Allergy status to analgesic agent status: Secondary | ICD-10-CM

## 2019-05-11 DIAGNOSIS — J984 Other disorders of lung: Secondary | ICD-10-CM

## 2019-05-11 DIAGNOSIS — J4540 Moderate persistent asthma, uncomplicated: Secondary | ICD-10-CM

## 2019-05-11 DIAGNOSIS — J310 Chronic rhinitis: Secondary | ICD-10-CM | POA: Diagnosis not present

## 2019-05-11 DIAGNOSIS — J339 Nasal polyp, unspecified: Secondary | ICD-10-CM

## 2019-05-11 DIAGNOSIS — J45909 Unspecified asthma, uncomplicated: Secondary | ICD-10-CM

## 2019-05-11 MED ORDER — DUPILUMAB 300 MG/2ML ~~LOC~~ SOSY
300.0000 mg | PREFILLED_SYRINGE | SUBCUTANEOUS | Status: DC
  Administered 2019-05-11 – 2022-03-18 (×62): 300 mg via SUBCUTANEOUS

## 2019-05-11 MED ORDER — EPINEPHRINE 0.3 MG/0.3ML IJ SOAJ: 0.3000 mg | Freq: Once | INTRAMUSCULAR | 2 refills | Status: AC

## 2019-05-11 NOTE — Progress Notes (Signed)
FOLLOW UP  Date of Service/Encounter:  05/11/19   Assessment:   Moderate persistent asthma, uncomplicated  Chronic rhinitis (grasses, molds)  Disorder of respiratory system exacerbated by aspirin  Nasal polyposis (L>R)  Plan/Recommendations:   1. Moderate persistent asthma, uncomplicated - Lung testing looked low today, but it seems to stable compared to the last one. - Dupixent started today.  - We are otherwise not going to make any medication changes at this time. - Daily controller medication(s): Wixela 250/50 mcg one puff twice daily + Dupixent every two weeks - Prior to physical activity: albuterol 2 puffs 10-15 minutes before physical activity. - Rescue medications: albuterol 4 puffs every 4-6 hours as needed - Asthma control goals:  * Full participation in all desired activities (may need albuterol before activity) * Albuterol use two time or less a week on average (not counting use with activity) * Cough interfering with sleep two time or less a month * Oral steroids no more than once a year * No hospitalizations  2. Chronic rhinitis with nasal polyps - Continue with fluticasone two sprays per nostril twice daily. - Continue with cetirizine 10mg  daily.  - Continue with Dupixent every two weeks.   3. Eczema versus contact dermatitis  - Continue with Dupixent every two weeks.  - Continue with your topical steroid to see if this helps with the rash.   4. Return in about 6 weeks (around 06/22/2019). This can be an in-person, a virtual Webex or a telephone follow up visit.   Subjective:   Tammie [name] is a 58 y.o. female presenting today for follow up of  Chief Complaint  Patient presents with  . Immunotherapy    Starting Dupixent?  . Shortness of Breath    Tammie [name] has a history of the following: Patient Active Problem List   Diagnosis Date Noted  . Chronic rhinitis 04/06/2019  . Disorder of respiratory system exacerbated by aspirin  04/06/2019  . Nasal polyposis 04/06/2019  . Moderate persistent asthma, uncomplicated [id number]  . Generalized anxiety disorder 09/25/2016  . Chronic seasonal allergic rhinitis 09/25/2016  . Aortic valve regurgitation 09/25/2016  . Asthma, chronic 04/26/2014  . Other dysphagia 04/26/2014  . Status asthmaticus 04/18/2014  . Hypertension 04/18/2014  . Sinus tachycardia 04/18/2014  . Delirium 04/18/2014  . Anaphylaxis 04/15/2014    History obtained from: chart review and patient.  Tammie [name] is a 58 y.o. female presenting for a follow up visit.  She was last seen in September 2020.  At that time, her lung testing looked low but improved with albuterol. We continued her on Wixela 250/50 one puff twice daily. WE discussed starting Dupixent for control of her nasal polyps and asthma. She did make the decision to go ahead and do this. We also obtained lab work that demonstrated sensitizations to grasses and 1 mold.  We did look at her alpha 1 antitrypsin level and this was normal.  We got Aspergillus precipitants and these were normal as well.  She had a previous CBC that confirmed eosinophilia with an absolute eosinophil count of 659.  Since the last visit, she has mostly done well.  She did make the decision to start [place] and has it with her today.  Asthma/Respiratory Symptom History: She remains on the Wixella 250/50 mcg 1 puff twice daily.  She has been having increased shortness of breath for the last 3 days.  She has been using her rescue inhaler with improvement in her symptoms.  Her sleep at night  has been fairly good.  She has not been in the hospital for her symptoms at all.  Allergic Rhinitis Symptom History: She is using cetirizine and fluticasone.  She is excited to get Dupixent on board as well.   Eczema Symptom History: Her skin seems to be under good control.  She does have a topical steroid that we prescribed at the last visit which was helping with her symptoms.  Otherwise,  there have been no changes to her past medical history, surgical history, family history, or social history.    Review of Systems  Constitutional: Negative.  Negative for chills, fever, malaise/fatigue and weight loss.  HENT: Negative.  Negative for congestion, ear discharge, ear pain, sinus pain and sore throat.   Eyes: Negative for pain, discharge and redness.  Respiratory: Negative for cough, sputum production, shortness of breath and wheezing.   Cardiovascular: Negative.  Negative for chest pain and palpitations.  Gastrointestinal: Negative for abdominal pain, constipation, diarrhea, heartburn, nausea and vomiting.  Skin: Negative.  Negative for itching and rash.  Neurological: Negative for dizziness and headaches.  Endo/Heme/Allergies: Negative for environmental allergies. Does not bruise/bleed easily.       Objective:   Blood pressure 104/60, pulse 85, temperature 98.5 F (36.9 C), temperature source Temporal, resp. rate 20, SpO2 94 %. There is no height or weight on file to calculate BMI.   Physical Exam:  Physical Exam  Constitutional: She appears well-developed.  Pleasant. Well appearing and interactive female.   HENT:  Head: Normocephalic and atraumatic.  Right Ear: Tympanic membrane, external ear and ear canal normal.  Left Ear: Tympanic membrane, external ear and ear canal normal.  Nose: Mucosal edema and rhinorrhea present. No nasal deformity or septal deviation. No epistaxis. Right sinus exhibits no maxillary sinus tenderness and no frontal sinus tenderness. Left sinus exhibits no maxillary sinus tenderness and no frontal sinus tenderness.  Mouth/Throat: Uvula is midline and oropharynx is clear and moist. Mucous membranes are not pale and not dry.  Tonsils 2+ bilaterally without exudates.  Eyes: Pupils are equal, round, and reactive to light. Conjunctivae and EOM are normal. Right eye exhibits no chemosis and no discharge. Left eye exhibits no chemosis and no  discharge. Right conjunctiva is not injected. Left conjunctiva is not injected.  Cardiovascular: Normal rate, regular rhythm and normal heart sounds.  Respiratory: Effort normal and breath sounds normal. No accessory muscle usage. No tachypnea. No respiratory distress. She has no wheezes. She has no rhonchi. She has no rales. She exhibits no tenderness.  Somewhat decreased air movement at the bases.  Lymphadenopathy:    She has no cervical adenopathy.  Neurological: She is alert.  Skin: No abrasion, no petechiae and no rash noted. Rash is not papular, not vesicular and not urticarial. No erythema. No pallor.  No eczematous or urticarial lesions noted.  Psychiatric: She has a normal mood and affect.     Diagnostic studies:    Spirometry: results abnormal (FEV1: 0.70/47%, FVC: 0.95/49%, FEV1/FVC: 74%).     Spirometry consistent with possible restrictive disease, but overall she gave poor effort.   Allergy Studies: none        Salvatore Marvel, MD  [facility]

## 2019-05-11 NOTE — Progress Notes (Signed)
Immunotherapy   Patient Details  Name: Tammie [name] MRN: [id number] Date of Birth: July 01, 1961  05/11/2019  Tammie [name] started injections for  DUPIXENT Frequency: Every 14 days Epi-Pen: Yes Consent signed and patient instructions given. Patient received 600mg  loading dose of Dupixent today. Patient will receive 300mg  every 14 days. Patient waited 30 minutes in office and did not experience any issues.    [name] 05/11/2019, 11:57 AM

## 2019-05-11 NOTE — Patient Instructions (Addendum)
1. Moderate persistent asthma, uncomplicated - Lung testing looked low today, but it seems to stable compared to the last one. - Dupixent started today.  - We are otherwise not going to make any medication changes at this time. - Daily controller medication(s): Wixela 250/50 mcg one puff twice daily + Dupixent every two weeks - Prior to physical activity: albuterol 2 puffs 10-15 minutes before physical activity. - Rescue medications: albuterol 4 puffs every 4-6 hours as needed - Asthma control goals:  * Full participation in all desired activities (may need albuterol before activity) * Albuterol use two time or less a week on average (not counting use with activity) * Cough interfering with sleep two time or less a month * Oral steroids no more than once a year * No hospitalizations  2. Chronic rhinitis with nasal polyps - Continue with fluticasone two sprays per nostril twice daily. - Continue with cetirizine 10mg  daily.  - Continue with Dupixent every two weeks.   3. Eczema versus contact dermatitis  - Continue with Dupixent every two weeks.  - Continue with your topical steroid to see if this helps with the rash.   4. Return in about 6 weeks (around 06/22/2019). This can be an in-person, a virtual Webex or a telephone follow up visit.   Please inform us of any Emergency Department visits, hospitalizations, or changes in symptoms. Call us before going to the ED for breathing or allergy symptoms since we might be able to fit you in for a sick visit. Feel free to contact us anytime with any questions, problems, or concerns.  It was a pleasure to see you again today!  Websites that have reliable patient information: 1. American Academy of Asthma, Allergy, and Immunology: www.aaaai.org 2. Food Allergy Research and Education (FARE): foodallergy.org 3. Mothers of Asthmatics: http://www.asthmacommunitynetwork.org 4. American College of Allergy, Asthma, and Immunology:  www.acaai.org  "Like" [place] on Facebook and Instagram for our latest updates!      Make sure you are registered to vote! If you have moved or changed any of your contact information, you will need to get this updated before voting!  In some cases, you MAY be able to register to vote online: [email or web address]    Voter ID laws are NOT going into effect for the General Election in November 2020! DO NOT let this stop you from exercising your right to vote!   Absentee voting is the SAFEST way to vote during the coronavirus pandemic!   Download and print an absentee ballot request form at [email or web address] or you can scan the QR code below with your smart phone:      More information on absentee ballots can be found here: [email or web address]

## 2019-05-12 ENCOUNTER — Other Ambulatory Visit: Payer: Self-pay

## 2019-05-12 DIAGNOSIS — Z20828 Contact with and (suspected) exposure to other viral communicable diseases: Secondary | ICD-10-CM | POA: Diagnosis not present

## 2019-05-12 DIAGNOSIS — Z20822 Contact with and (suspected) exposure to covid-19: Secondary | ICD-10-CM

## 2019-05-14 LAB — NOVEL CORONAVIRUS, NAA: SARS-CoV-2, NAA: DETECTED — AB

## 2019-05-24 ENCOUNTER — Telehealth: Payer: Self-pay | Admitting: *Deleted

## 2019-05-24 NOTE — Telephone Encounter (Signed)
Patient called and advised she was unable to get next [place] shipment from [facility].  I called them and it turns out her 1st shipment from them was 9/16 then on 10/1. Although she had brought her medication in on 10/6 and received loading dose as I had instructed she had used the 1st shipment to try and inject her herself but only could one injection done.  I advised her the reason they could not ship was due to dosing which she should be at every 14 days and not due for shipment until next week. She has appt for her 2nd inj (mt dose) tomorrow so I advised her we would give her a sample to get her by until she can get her next shipment.

## 2019-05-25 ENCOUNTER — Ambulatory Visit: Payer: Self-pay

## 2019-06-12 ENCOUNTER — Other Ambulatory Visit: Payer: Self-pay | Admitting: Nurse Practitioner

## 2019-06-12 DIAGNOSIS — I10 Essential (primary) hypertension: Secondary | ICD-10-CM

## 2019-06-22 ENCOUNTER — Encounter: Payer: Self-pay | Admitting: Allergy

## 2019-06-22 ENCOUNTER — Other Ambulatory Visit: Payer: Self-pay

## 2019-06-22 ENCOUNTER — Ambulatory Visit: Payer: Medicare HMO | Admitting: Allergy

## 2019-06-22 VITALS — BP 136/62 | HR 90 | Temp 98.4°F | Resp 18 | Ht 59.0 in

## 2019-06-22 DIAGNOSIS — J4540 Moderate persistent asthma, uncomplicated: Secondary | ICD-10-CM

## 2019-06-22 DIAGNOSIS — J310 Chronic rhinitis: Secondary | ICD-10-CM

## 2019-06-22 DIAGNOSIS — L309 Dermatitis, unspecified: Secondary | ICD-10-CM | POA: Diagnosis not present

## 2019-06-22 DIAGNOSIS — J339 Nasal polyp, unspecified: Secondary | ICD-10-CM | POA: Diagnosis not present

## 2019-06-22 MED ORDER — FLUTICASONE-SALMETEROL 250-50 MCG/DOSE IN AEPB: 1.0000 | INHALATION_SPRAY | Freq: Two times a day (BID) | RESPIRATORY_TRACT | 5 refills | Status: DC

## 2019-06-22 NOTE — Assessment & Plan Note (Signed)
Slightly improved with topical steroid cream.  May use triamcinolone ointment twice a day as needed. Do not use on the face, neck, armpits or groin area. Do not use more than 3 weeks in a row.

## 2019-06-22 NOTE — Assessment & Plan Note (Signed)
Started on [place] in October 2020. No difference in respiratory status.  ACT score 19  Today's spirometry showed: mixed restriction and obstruction similar as before.  . Daily controller medication(s): continue Wixela 250 1 puff twice daily and rinse mouth afterwards. o Continue Dupixent every 2 weeks.  . Prior to physical activity: May use albuterol rescue inhaler 2 puffs 5 to 15 minutes prior to strenuous physical activities. Marland Kitchen Rescue medications: May use albuterol rescue inhaler 2 puffs or nebulizer every 4 to 6 hours as needed for shortness of breath, chest tightness, coughing, and wheezing. Monitor frequency of use.

## 2019-06-22 NOTE — Progress Notes (Signed)
Follow Up Note  RE: Tammie [name] MRN: [id number] DOB: 07-03-1961 Date of Office Visit: 06/22/2019  Referring provider: [name], NP Primary care provider: [name], NP  Chief Complaint: No chief complaint on file.  History of Present Illness: I had the pleasure of seeing Tammie [name] for a follow up visit at the [facility] on 06/22/2019. She is a 58 y.o. female, who is being followed for asthma, chronic rhinitis with polyps and dermatitis. Today she is here for regular follow up visit.  Her previous allergy office visit was on 05/11/2019 with Dr. Ernst Bowler.   1. Moderate persistent asthma Started on Dupixent in October.  Since starting the injection noticed that her sense of smell is better but her breathing is about the same as before. Currently on Wixela 250 1 puff twice a day and not rinsing mouth afterwards.  Using albuterol about 1-2 times a week for chest tightness with good benefit.   2. Chronic rhinitis with nasal polyps Currently on Flonase 2 sprays twice a day and Zyrtec 10mg  daily. Started on [place] in October.   Taking some Mucinex sometimes with good benefit.   3. Eczema versus contact dermatitis  Slightly better and using topical steroid cream.  Assessment and Plan: Tammie [name] is a 58 y.o. female with: Moderate persistent asthma without complication Started on [place] in October 2020. No difference in respiratory status.  ACT score 19  Today's spirometry showed: mixed restriction and obstruction similar as before.  . Daily controller medication(s): continue Wixela 250 1 puff twice daily and rinse mouth afterwards. o Continue Dupixent every 2 weeks.  . Prior to physical activity: May use albuterol rescue inhaler 2 puffs 5 to 15 minutes prior to strenuous physical activities. Marland Kitchen Rescue medications: May use albuterol rescue inhaler 2 puffs or nebulizer every 4 to 6 hours as needed for shortness of breath, chest tightness,  coughing, and wheezing. Monitor frequency of use.   Chronic rhinitis Past history - 2020 bloodwork positive to grass pollen. Interim history - Sense of smell improved with Dupixent.  Continue zyrtec (ceterizine) 10mg  daily.  Continue fluticasone nasal spray 1-2 sprays twice a day.   Continue Dupixent every 2 weeks.  Nasal polyposis  See assessment and plan as above for chronic rhinitis.  Dermatitis Slightly improved with topical steroid cream.  May use triamcinolone ointment twice a day as needed. Do not use on the face, neck, armpits or groin area. Do not use more than 3 weeks in a row.   Return in about 4 weeks (around 07/20/2019).  Meds ordered this encounter  Medications  . Fluticasone-Salmeterol (WIXELA INHUB) 250-50 MCG/DOSE AEPB    Sig: Inhale 1 puff into the lungs 2 (two) times daily.    Dispense:  60 each    Refill:  5   Diagnostics: Spirometry:  Tracings reviewed. Her effort: It was hard to get consistent efforts and there is a question as to whether this reflects a maximal maneuver. FVC: 0.98L FEV1: 0.66L, 40% predicted FEV1/FVC ratio: 67% Interpretation: Spirometry consistent with mixed obstructive and restrictive disease.  Please see scanned spirometry results for details.  Medication List:  Current Outpatient Medications  Medication Sig Dispense Refill  . acetaminophen (TYLENOL) 160 MG/5ML suspension Take by mouth every 6 (six) hours as needed.    Marland Kitchen albuterol (VENTOLIN HFA) 108 (90 Base) MCG/ACT inhaler INHALE 2 PUFFS INTO THE LUNGS EVERY 6 HOURS AS NEEDED FOR WHEEZING OR SHORTNESS OF BREATH 54 g 1  .  amLODipine (NORVASC) 10 MG tablet TAKE 1 TABLET BY MOUTH EVERY DAY FOR HIGH BLOOD PRESSURE 90 tablet 1  . bisoprolol (ZEBETA) 5 MG tablet TAKE 1/2 TABLET BY MOUTH EVERY DAY FOR HIGH BLOOD PRESSURE 45 tablet 1  . cetirizine (ZYRTEC) 10 MG tablet TAKE 1 TABLET(10 MG) BY MOUTH DAILY 30 tablet 0  . EPINEPHrine 0.3 mg/0.3 mL IJ SOAJ injection     . fluticasone  (FLONASE) 50 MCG/ACT nasal spray SHAKE LIQUID AND USE 2 SPRAYS IN EACH NOSTRIL DAILY 48 g 3  . Fluticasone-Salmeterol (WIXELA INHUB) 250-50 MCG/DOSE AEPB Inhale 1 puff into the lungs 2 (two) times daily. 60 each 5  . hydrALAZINE (APRESOLINE) 25 MG tablet Take one tablet by mouth every 8 hours 90 tablet 3  . Omega-3 Fatty Acids (FISH OIL) 500 MG CAPS Take 1 capsule by mouth daily.    Marland Kitchen triamcinolone ointment (KENALOG) 0.5 % Apply 1 application topically 2 (two) times daily. 120 g 3   Current Facility-Administered Medications  Medication Dose Route Frequency Provider Last Rate Last Dose  . dupilumab (DUPIXENT) prefilled syringe 300 mg  300 mg Subcutaneous Q14 Days Valentina Shaggy, MD   300 mg at 06/22/19 1032   Allergies: Allergies  Allergen Reactions  . Ace Inhibitors Other (See Comments)    Angioedema  . Aspirin Anaphylaxis, Swelling and Other (See Comments)    Throat swelling   . Ibuprofen Anaphylaxis, Swelling and Other (See Comments)    Throat swelling  . Nsaids Anaphylaxis, Swelling and Other (See Comments)  . Lipitor [Atorvastatin] Swelling and Other (See Comments)    Face, feet swell   I reviewed her past medical history, social history, family history, and environmental history and no significant changes have been reported from her previous visit.  Review of Systems  Constitutional: Negative for appetite change, chills, fever and unexpected weight change.  HENT: Negative for congestion and rhinorrhea.   Eyes: Negative for itching.  Respiratory: Positive for chest tightness. Negative for cough, shortness of breath and wheezing.   Gastrointestinal: Negative for abdominal pain.  Skin: Positive for rash.  Neurological: Negative for headaches.   Objective: BP 136/62   Pulse 90   Temp 98.4 F (36.9 C) (Temporal)   Resp 18   Ht 4\' 11"  (1.499 m)   SpO2 94%   BMI 32.52 kg/m  Body mass index is 32.52 kg/m. Physical Exam  Constitutional: She is oriented to person,  place, and time. She appears well-developed and well-nourished.  HENT:  Head: Normocephalic and atraumatic.  Right Ear: External ear normal.  Left Ear: External ear normal.  Nose: Nose normal.  Mouth/Throat: Oropharynx is clear and moist.  Eyes: Conjunctivae and EOM are normal.  Neck: Neck supple.  Cardiovascular: Normal rate, regular rhythm and normal heart sounds. Exam reveals no gallop and no friction rub.  No murmur heard. Pulmonary/Chest: Effort normal and breath sounds normal. She has no wheezes. She has no rales.  Neurological: She is alert and oriented to person, place, and time.  Skin: Skin is warm. Rash noted.  Left ankle area - dry, leathery, hyperpigmented patch.   Psychiatric: She has a normal mood and affect. Her behavior is normal.  Nursing note and vitals reviewed.  Previous notes and tests were reviewed. The plan was reviewed with the patient/family, and all questions/concerned were addressed.  It was my pleasure to see Delmy today and participate in her care. Please feel free to contact me with any questions or concerns.  Sincerely,  Rexene Alberts,  DO Allergy & Immunology  [facility] office: [phone] [facility] office: [place] office: [phone]

## 2019-06-22 NOTE — Assessment & Plan Note (Signed)
   See assessment and plan as above for chronic rhinitis.  

## 2019-06-22 NOTE — Assessment & Plan Note (Signed)
Past history - 2020 bloodwork positive to grass pollen. Interim history - Sense of smell improved with Dupixent.  Continue zyrtec (ceterizine) 10mg  daily.  Continue fluticasone nasal spray 1-2 sprays twice a day.   Continue Dupixent every 2 weeks.

## 2019-06-22 NOTE — Patient Instructions (Addendum)
Asthma: . Daily controller medication(s): continue Wixela 250 1 puff twice daily and rinse mouth afterwards. o Continue Dupixent every 2 weeks.  . Prior to physical activity: May use albuterol rescue inhaler 2 puffs 5 to 15 minutes prior to strenuous physical activities. Marland Kitchen Rescue medications: May use albuterol rescue inhaler 2 puffs or nebulizer every 4 to 6 hours as needed for shortness of breath, chest tightness, coughing, and wheezing. Monitor frequency of use.  . Asthma control goals:  o Full participation in all desired activities (may need albuterol before activity) o Albuterol use two times or less a week on average (not counting use with activity) o Cough interfering with sleep two times or less a month o Oral steroids no more than once a year o No hospitalizations  Chronic rhinitis with nasal polyps  Continue zyrtec (ceterizine) 10mg  daily.  Continue fluticasone nasal spray 1-2 sprays twice a day.   Continue Dupixent every 2 weeks.  Eczema  Continue with Dupixent every two weeks.   Continue with your topical steroid to see if this helps with the rash.   Follow up in 4 weeks with Dr. Ernst Bowler or sooner if needed.

## 2019-07-06 ENCOUNTER — Other Ambulatory Visit: Payer: Self-pay

## 2019-07-06 ENCOUNTER — Ambulatory Visit (INDEPENDENT_AMBULATORY_CARE_PROVIDER_SITE_OTHER): Payer: Medicare HMO | Admitting: *Deleted

## 2019-07-06 DIAGNOSIS — J4540 Moderate persistent asthma, uncomplicated: Secondary | ICD-10-CM | POA: Diagnosis not present

## 2019-07-08 ENCOUNTER — Other Ambulatory Visit: Payer: Self-pay | Admitting: Nurse Practitioner

## 2019-07-08 DIAGNOSIS — J4540 Moderate persistent asthma, uncomplicated: Secondary | ICD-10-CM

## 2019-07-12 ENCOUNTER — Encounter: Payer: Self-pay | Admitting: Nurse Practitioner

## 2019-07-12 ENCOUNTER — Ambulatory Visit (INDEPENDENT_AMBULATORY_CARE_PROVIDER_SITE_OTHER): Payer: Medicare HMO | Admitting: Nurse Practitioner

## 2019-07-12 ENCOUNTER — Other Ambulatory Visit: Payer: Self-pay

## 2019-07-12 VITALS — BP 142/84 | HR 78 | Temp 98.6°F | Ht 59.0 in | Wt 157.0 lb

## 2019-07-12 DIAGNOSIS — R739 Hyperglycemia, unspecified: Secondary | ICD-10-CM

## 2019-07-12 DIAGNOSIS — J4540 Moderate persistent asthma, uncomplicated: Secondary | ICD-10-CM | POA: Diagnosis not present

## 2019-07-12 DIAGNOSIS — H6121 Impacted cerumen, right ear: Secondary | ICD-10-CM

## 2019-07-12 DIAGNOSIS — J302 Other seasonal allergic rhinitis: Secondary | ICD-10-CM | POA: Diagnosis not present

## 2019-07-12 DIAGNOSIS — E782 Mixed hyperlipidemia: Secondary | ICD-10-CM | POA: Diagnosis not present

## 2019-07-12 DIAGNOSIS — I10 Essential (primary) hypertension: Secondary | ICD-10-CM

## 2019-07-12 NOTE — Progress Notes (Addendum)
Careteam: Patient Care Team: [name], NP as PCP - General (Geriatric Medicine) [name], MD as Consulting Physician (Ophthalmology)  Advanced Directive information    Allergies  Allergen Reactions  . Ace Inhibitors Other (See Comments)    Angioedema  . Aspirin Anaphylaxis, Swelling and Other (See Comments)    Throat swelling   . Ibuprofen Anaphylaxis, Swelling and Other (See Comments)    Throat swelling  . Nsaids Anaphylaxis, Swelling and Other (See Comments)  . Lipitor [Atorvastatin] Swelling and Other (See Comments)    Face, feet swell    Chief Complaint  Patient presents with  . Medical Management of Chronic Issues    4 month follow-up   . Advanced Directive    Discuss need for ACP   . FYI    Patient had 2 flu vaccines and 2 pneumonia vaccines this year. Patient did not remember and got them again.      HPI: Patient is a 58 y.o. female seen in the office today for routine follow up.  Pt noted to have COVID in October but she did not have symptoms. She was with her sister who was diagnosised with it.   Neck pain- went to neurosurgery and was diagnosised with arthritis. Did not offer her much. Reports she did have xrays there. Reports she is not sure when she went or who she saw. Ongoing numbness and tingling in hand but no loss of strength or use of hands. Had to change how she slept which improved things.   htn-does not check blood pressure at home. Taking norvasc, bisoprolol 2.5 mg daily, and hydralazine 25 mg every 8 hours. Pt reports that due to her lack of taste and smell from years of allergies and congestion she has always added a lot of salt to her food for taste.   Poor dentition- continues, has not made it to the dentist yet.   Asthma- following with pulmonary and allergies and now going for immunotherapy due to asthma and severe allergies. Now she can smell and breathing is controlled better.   Hyperglycemia- drinking a half of liter  of pepsi a day.   Right ear is stopped up   Review of Systems:  Review of Systems  Constitutional: Negative for chills, fever and weight loss.  HENT:       Right ear stopped up  Respiratory: Negative for cough, sputum production and shortness of breath.   Cardiovascular: Negative for chest pain, palpitations and leg swelling.  Gastrointestinal: Negative for abdominal pain, constipation, diarrhea and heartburn.  Genitourinary: Negative for dysuria, frequency and urgency.  Musculoskeletal: Positive for neck pain (overall improved). Negative for back pain, falls, joint pain and myalgias.  Skin: Negative.   Neurological: Negative for dizziness, tingling and headaches.  Psychiatric/Behavioral: Negative for depression and memory loss. The patient does not have insomnia.    Past Medical History:  Diagnosis Date  . Allergy   . Anemia   . Anxiety   . Asthma   . Eczema   . Environmental allergies   . GERD (gastroesophageal reflux disease)    Patient denies  . Headache(784.0)   . Hypertension   . Respiratory failure, acute ([place]) 04/19/2014  . Shortness of breath    Past Surgical History:  Procedure Laterality Date  . [place] SINUS SURGERY     Polyps   Social History:   reports that she has never smoked. She has never used  smokeless tobacco. She reports that she does not drink alcohol or use drugs.  Family History  Problem Relation Age of Onset  . Heart attack Father   . Heart attack Mother   . Diabetes Sister   . High blood pressure Brother   . Hypertension Brother   . High blood pressure Sister   . Cancer Sister   . High blood pressure Son   . Cancer Maternal Grandmother   . Heart disease Paternal Grandmother   . Pancreatic cancer Paternal Grandmother   . Asthma Other        Great paternal grandfather   . Autism Son   . Asthma Other        Neice   . Colon cancer Neg Hx   . Esophageal cancer Neg Hx   . Rectal  cancer Neg Hx   . Stomach cancer Neg Hx   . Allergic rhinitis Neg Hx   . Angioedema Neg Hx   . Eczema Neg Hx   . Immunodeficiency Neg Hx   . Atopy Neg Hx     Medications: Patient's Medications  New Prescriptions   No medications on file  Previous Medications   ACETAMINOPHEN (TYLENOL) 160 MG/5ML SUSPENSION    Take by mouth every 6 (six) hours as needed.   ALBUTEROL (VENTOLIN HFA) 108 (90 BASE) MCG/ACT INHALER    INHALE 2 PUFFS INTO THE LUNGS EVERY 6 HOURS AS NEEDED FOR WHEEZING OR SHORTNESS OF BREATH   AMLODIPINE (NORVASC) 10 MG TABLET    TAKE 1 TABLET BY MOUTH EVERY DAY FOR HIGH BLOOD PRESSURE   BISOPROLOL (ZEBETA) 5 MG TABLET    TAKE 1/2 TABLET BY MOUTH EVERY DAY FOR HIGH BLOOD PRESSURE   CETIRIZINE (ZYRTEC) 10 MG TABLET    TAKE 1 TABLET(10 MG) BY MOUTH DAILY   EPINEPHRINE 0.3 MG/0.3 ML IJ SOAJ INJECTION       FLUTICASONE (FLONASE) 50 MCG/ACT NASAL SPRAY    SHAKE LIQUID AND USE 2 SPRAYS IN EACH NOSTRIL DAILY   FLUTICASONE-SALMETEROL (WIXELA INHUB) 250-50 MCG/DOSE AEPB    Inhale 1 puff into the lungs 2 (two) times daily.   HYDRALAZINE (APRESOLINE) 25 MG TABLET    Take one tablet by mouth every 8 hours   OMEGA-3 FATTY ACIDS (FISH OIL) 500 MG CAPS    Take 1 capsule by mouth daily.   TRIAMCINOLONE OINTMENT (KENALOG) 0.5 %    Apply 1 application topically 2 (two) times daily.  Modified Medications   No medications on file  Discontinued Medications   No medications on file    Physical Exam:  Vitals:   07/12/19 1015  BP: (!) 142/84  Pulse: 78  Temp: 98.6 F (37 C)  TempSrc: Temporal  SpO2: 96%  Weight: 157 lb (71.2 kg)  Height: '4\' 11"'  (1.499 m)   Body mass index is 31.71 kg/m. Wt Readings from Last 3 Encounters:  07/12/19 157 lb (71.2 kg)  04/06/19 161 lb (73 kg)  03/12/19 162 lb 3.2 oz (73.6 kg)    Physical Exam Constitutional:      General: She is not in acute distress.    Appearance: She is well-developed. She is not diaphoretic.  HENT:     Head: Normocephalic  and atraumatic.     Right Ear: There is impacted cerumen.     Left Ear: Tympanic membrane, ear canal and external ear normal. There is no impacted cerumen.  Eyes:     Conjunctiva/sclera: Conjunctivae normal.     Pupils: Pupils are equal, round,  and reactive to light.  Neck:     Musculoskeletal: Normal range of motion and neck supple. No pain with movement, spinous process tenderness or muscular tenderness.  Cardiovascular:     Rate and Rhythm: Normal rate and regular rhythm.     Heart sounds: Normal heart sounds.  Pulmonary:     Effort: Pulmonary effort is normal.     Breath sounds: Normal breath sounds. No wheezing.  Abdominal:     General: Bowel sounds are normal.     Palpations: Abdomen is soft.  Musculoskeletal:        General: No tenderness.  Skin:    General: Skin is warm and dry.  Neurological:     Mental Status: She is alert and oriented to person, place, and time.     Cranial Nerves: No cranial nerve deficit.     Sensory: No sensory deficit.  Psychiatric:        Mood and Affect: Mood normal.        Behavior: Behavior normal.     Labs reviewed: Basic Metabolic Panel: Recent Labs    09/15/18 1119 03/12/19 0941  NA 141 143  K 3.6 3.4*  CL 104 101  CO2 30 30  GLUCOSE 116* 117*  BUN 7 8  CREATININE 0.91 0.98  CALCIUM 9.7 9.6   Liver Function Tests: Recent Labs    09/15/18 1119 03/12/19 0941  AST 21 26  ALT 17 27  BILITOT 0.7 0.8  PROT 7.5 7.2   No results for input(s): LIPASE, AMYLASE in the last 8760 hours. No results for input(s): AMMONIA in the last 8760 hours. CBC: Recent Labs    09/15/18 1119 03/12/19 0941  WBC 6.9 6.4  NEUTROABS 4,168 3,354  HGB 13.5 14.1  HCT 39.7 43.1  MCV 80.5 80.7  PLT 309 326   Lipid Panel: Recent Labs    09/15/18 1119 03/12/19 0941  CHOL 167 147  HDL 43* 36*  LDLCALC 95 79  TRIG 191* 230*  CHOLHDL 3.9 4.1   TSH: No results for input(s): TSH in the last 8760 hours. A1C: Lab Results  Component Value  Date   HGBA1C 6.1 (H) 09/15/2018     Assessment/Plan 1. Chronic seasonal allergic rhinitis Doing well at this time. Has had benefits from immunotherapy and follow up with the allergist   2. Moderate persistent asthma without complication Doing well on current regimen. Continues to follow with   3. Essential hypertension -elevated today, she admits to using a lot of salt and eating a lot of processed foods. Discussed limiting sodium in diet as it elevates blood pressure. DASH diet given. Will continue current regimen, she is willing to cut back on her salt.  4. Mixed hyperlipidemia -fasting today, will follow up lipid panel, discussed low triglyceride diet and increase physical activity. Not currently on medication at this time.  - Lipid Panel  5. Hyperglycemia -discussed sugar intake and use of soft drinks. Will follow up labs today. Encouraged to reduce pepsi and increase water intake in diet.  - BMP with eGFR - Hemoglobin A1c  6. Impacted cerumen of right ear -flushed ear but unable to remove wax, too far back to use tool. Encouraged to use debrox drops BID for 4 days and then return to office for repeat lavage   Next appt: 3 months for routine follow up [place]. [place], [facility] [phone]

## 2019-07-12 NOTE — Patient Instructions (Signed)
Stop the salt shaker- NO Added salt to meals. A lot of food has a high amount of sodium (salt) already in it  DASH Eating Plan DASH stands for "Dietary Approaches to Stop Hypertension." The DASH eating plan is a healthy eating plan that has been shown to reduce high blood pressure (hypertension). It may also reduce your risk for type 2 diabetes, heart disease, and stroke. The DASH eating plan may also help with weight loss. What are tips for following this plan?  General guidelines  Avoid eating more than 2,300 mg (milligrams) of salt (sodium) a day. If you have hypertension, you may need to reduce your sodium intake to 1,500 mg a day.  Limit alcohol intake to no more than 1 drink a day for nonpregnant women and 2 drinks a day for men. One drink equals 12 oz of beer, 5 oz of wine, or 1 oz of hard liquor.  Work with your health care provider to maintain a healthy body weight or to lose weight. Ask what an ideal weight is for you.  Get at least 30 minutes of exercise that causes your heart to beat faster (aerobic exercise) most days of the week. Activities may include walking, swimming, or biking.  Work with your health care provider or diet and nutrition specialist (dietitian) to adjust your eating plan to your individual calorie needs. Reading food labels   Check food labels for the amount of sodium per serving. Choose foods with less than 5 percent of the Daily Value of sodium. Generally, foods with less than 300 mg of sodium per serving fit into this eating plan.  To find whole grains, look for the word "whole" as the first word in the ingredient list. Shopping  Buy products labeled as "low-sodium" or "no salt added."  Buy fresh foods. Avoid canned foods and premade or frozen meals. Cooking  Avoid adding salt when cooking. Use salt-free seasonings or herbs instead of table salt or sea salt. Check with your health care provider or pharmacist before using salt substitutes.  Do not  fry foods. Cook foods using healthy methods such as baking, boiling, grilling, and broiling instead.  Cook with heart-healthy oils, such as olive, canola, soybean, or sunflower oil. Meal planning  Eat a balanced diet that includes: ? 5 or more servings of fruits and vegetables each day. At each meal, try to fill half of your plate with fruits and vegetables. ? Up to 6-8 servings of whole grains each day. ? Less than 6 oz of lean meat, poultry, or fish each day. A 3-oz serving of meat is about the same size as a deck of cards. One egg equals 1 oz. ? 2 servings of low-fat dairy each day. ? A serving of nuts, seeds, or beans 5 times each week. ? Heart-healthy fats. Healthy fats called Omega-3 fatty acids are found in foods such as flaxseeds and coldwater fish, like sardines, salmon, and mackerel.  Limit how much you eat of the following: ? Canned or prepackaged foods. ? Food that is high in trans fat, such as fried foods. ? Food that is high in saturated fat, such as fatty meat. ? Sweets, desserts, sugary drinks, and other foods with added sugar. ? Full-fat dairy products.  Do not salt foods before eating.  Try to eat at least 2 vegetarian meals each week.  Eat more home-cooked food and less restaurant, buffet, and fast food.  When eating at a restaurant, ask that your food be prepared with less  salt or no salt, if possible. What foods are recommended? The items listed may not be a complete list. Talk with your dietitian about what dietary choices are best for you. Grains Whole-grain or whole-wheat bread. Whole-grain or whole-wheat pasta. Brown rice. [place]row. Bulgur. Whole-grain and low-sodium cereals. Pita bread. Low-fat, low-sodium crackers. Whole-wheat flour tortillas. Vegetables Fresh or frozen vegetables (raw, steamed, roasted, or grilled). Low-sodium or reduced-sodium tomato and vegetable juice. Low-sodium or reduced-sodium tomato sauce and tomato paste. Low-sodium or  reduced-sodium canned vegetables. Fruits All fresh, dried, or frozen fruit. Canned fruit in natural juice (without added sugar). Meat and other protein foods Skinless chicken or [place]. Ground chicken or [place]. Pork with fat trimmed off. Fish and seafood. Egg whites. Dried beans, peas, or lentils. Unsalted nuts, nut butters, and seeds. Unsalted canned beans. Lean cuts of beef with fat trimmed off. Low-sodium, lean deli meat. Dairy Low-fat (1%) or fat-free (skim) milk. Fat-free, low-fat, or reduced-fat cheeses. Nonfat, low-sodium ricotta or cottage cheese. Low-fat or nonfat yogurt. Low-fat, low-sodium cheese. Fats and oils Soft margarine without trans fats. Vegetable oil. Low-fat, reduced-fat, or light mayonnaise and salad dressings (reduced-sodium). Canola, safflower, olive, soybean, and sunflower oils. Avocado. Seasoning and other foods Herbs. Spices. Seasoning mixes without salt. Unsalted popcorn and pretzels. Fat-free sweets. What foods are not recommended? The items listed may not be a complete list. Talk with your dietitian about what dietary choices are best for you. Grains Baked goods made with fat, such as croissants, muffins, or some breads. Dry pasta or rice meal packs. Vegetables Creamed or fried vegetables. Vegetables in a cheese sauce. Regular canned vegetables (not low-sodium or reduced-sodium). Regular canned tomato sauce and paste (not low-sodium or reduced-sodium). Regular tomato and vegetable juice (not low-sodium or reduced-sodium). Angie Fava. Olives. Fruits Canned fruit in a light or heavy syrup. Fried fruit. Fruit in cream or butter sauce. Meat and other protein foods Fatty cuts of meat. Ribs. Fried meat. Berniece Salines. Sausage. Bologna and other processed lunch meats. Salami. Fatback. Hotdogs. Bratwurst. Salted nuts and seeds. Canned beans with added salt. Canned or smoked fish. Whole eggs or egg yolks. Chicken or [place] with skin. Dairy Whole or 2% milk, cream, and half-and-half.  Whole or full-fat cream cheese. Whole-fat or sweetened yogurt. Full-fat cheese. Nondairy creamers. Whipped toppings. Processed cheese and cheese spreads. Fats and oils Butter. Stick margarine. Lard. Shortening. Ghee. Bacon fat. Tropical oils, such as coconut, palm kernel, or palm oil. Seasoning and other foods Salted popcorn and pretzels. Onion salt, garlic salt, seasoned salt, table salt, and sea salt. Worcestershire sauce. Tartar sauce. Barbecue sauce. Teriyaki sauce. Soy sauce, including reduced-sodium. Steak sauce. Canned and packaged gravies. Fish sauce. Oyster sauce. Cocktail sauce. Horseradish that you find on the shelf. Ketchup. Mustard. Meat flavorings and tenderizers. Bouillon cubes. Hot sauce and Tabasco sauce. Premade or packaged marinades. Premade or packaged taco seasonings. Relishes. Regular salad dressings. Where to find more information:  National Heart, Lung, and [place]: https://wilson-eaton.com/  American Heart Association: www.heart.org Summary  The DASH eating plan is a healthy eating plan that has been shown to reduce high blood pressure (hypertension). It may also reduce your risk for type 2 diabetes, heart disease, and stroke.  With the DASH eating plan, you should limit salt (sodium) intake to 2,300 mg a day. If you have hypertension, you may need to reduce your sodium intake to 1,500 mg a day.  When on the DASH eating plan, aim to eat more fresh fruits and vegetables, whole grains, lean proteins, low-fat  dairy, and heart-healthy fats.  Work with your health care provider or diet and nutrition specialist (dietitian) to adjust your eating plan to your individual calorie needs. This information is not intended to replace advice given to you by your health care provider. Make sure you discuss any questions you have with your health care provider. Document Released: 07/11/2011 Document Revised: 07/04/2017 Document Reviewed: 07/15/2016 Elsevier Patient Education  2020  [employer].

## 2019-07-13 LAB — HEMOGLOBIN A1C
Hgb A1c MFr Bld: 6.4 % of total Hgb — ABNORMAL HIGH (ref <5.7)
Mean Plasma Glucose: 137 (calc)
eAG (mmol/L): 7.6 (calc)

## 2019-07-13 LAB — BASIC METABOLIC PANEL WITH GFR
BUN: 10 mg/dL (ref 7–25)
CO2: 30 mmol/L (ref 20–32)
Calcium: 9.9 mg/dL (ref 8.6–10.4)
Chloride: 103 mmol/L (ref 98–110)
Creat: 0.98 mg/dL (ref 0.50–0.99)
GFR, Est African American: 73 mL/min/{1.73_m2} (ref >=60)
GFR, Est Non African American: 63 mL/min/{1.73_m2} (ref >=60)
Glucose, Bld: 111 mg/dL — ABNORMAL HIGH (ref 65–99)
Potassium: 3.3 mmol/L — ABNORMAL LOW (ref 3.5–5.3)
Sodium: 141 mmol/L (ref 135–146)

## 2019-07-13 LAB — LIPID PANEL
Cholesterol: 168 mg/dL (ref <200)
HDL: 40 mg/dL — ABNORMAL LOW (ref >=50)
LDL Cholesterol (Calc): 97 mg/dL (calc)
Non-HDL Cholesterol (Calc): 128 mg/dL (calc) (ref <130)
Total CHOL/HDL Ratio: 4.2 (calc) (ref <5.0)
Triglycerides: 216 mg/dL — ABNORMAL HIGH (ref <150)

## 2019-07-15 NOTE — Addendum Note (Signed)
Addended by: [name] on: 07/15/2019 12:53 PM   Modules accepted: Orders

## 2019-07-20 ENCOUNTER — Ambulatory Visit (INDEPENDENT_AMBULATORY_CARE_PROVIDER_SITE_OTHER): Payer: Medicare HMO

## 2019-07-20 ENCOUNTER — Other Ambulatory Visit: Payer: Self-pay

## 2019-07-20 DIAGNOSIS — J4540 Moderate persistent asthma, uncomplicated: Secondary | ICD-10-CM | POA: Diagnosis not present

## 2019-08-04 ENCOUNTER — Ambulatory Visit (INDEPENDENT_AMBULATORY_CARE_PROVIDER_SITE_OTHER): Payer: Medicare HMO | Admitting: *Deleted

## 2019-08-04 ENCOUNTER — Other Ambulatory Visit: Payer: Self-pay

## 2019-08-04 ENCOUNTER — Other Ambulatory Visit: Payer: Self-pay | Admitting: Nurse Practitioner

## 2019-08-04 DIAGNOSIS — J4540 Moderate persistent asthma, uncomplicated: Secondary | ICD-10-CM

## 2019-08-18 ENCOUNTER — Ambulatory Visit (INDEPENDENT_AMBULATORY_CARE_PROVIDER_SITE_OTHER): Payer: Medicare HMO | Admitting: *Deleted

## 2019-08-18 ENCOUNTER — Other Ambulatory Visit: Payer: Self-pay

## 2019-08-18 DIAGNOSIS — J4540 Moderate persistent asthma, uncomplicated: Secondary | ICD-10-CM | POA: Diagnosis not present

## 2019-08-22 ENCOUNTER — Other Ambulatory Visit: Payer: Self-pay | Admitting: Nurse Practitioner

## 2019-08-22 DIAGNOSIS — J4540 Moderate persistent asthma, uncomplicated: Secondary | ICD-10-CM

## 2019-08-23 ENCOUNTER — Other Ambulatory Visit: Payer: Self-pay | Admitting: *Deleted

## 2019-08-23 MED ORDER — CETIRIZINE HCL 10 MG PO TABS: ORAL_TABLET | ORAL | 5 refills | Status: DC

## 2019-08-24 ENCOUNTER — Other Ambulatory Visit: Payer: Self-pay

## 2019-09-01 ENCOUNTER — Other Ambulatory Visit: Payer: Self-pay

## 2019-09-01 ENCOUNTER — Ambulatory Visit (INDEPENDENT_AMBULATORY_CARE_PROVIDER_SITE_OTHER): Payer: Medicare HMO | Admitting: *Deleted

## 2019-09-01 DIAGNOSIS — J4540 Moderate persistent asthma, uncomplicated: Secondary | ICD-10-CM | POA: Diagnosis not present

## 2019-09-02 ENCOUNTER — Telehealth: Payer: Self-pay | Admitting: Pulmonary Disease

## 2019-09-02 NOTE — Telephone Encounter (Signed)
Margaretha Seeds, MD  P [facility] Triage Pool  If patient wishes to continue to receive refills from our office, please have her schedule follow-up with me or NP for asthma follow-up. -JE     Called and spoke to pt. Appt scheduled with JE on 09/13/2019. Pt verbalized understanding and denied any further questions or concerns at this time.

## 2019-09-13 ENCOUNTER — Encounter: Payer: Self-pay | Admitting: Pulmonary Disease

## 2019-09-13 ENCOUNTER — Ambulatory Visit: Payer: Medicare HMO | Admitting: Pulmonary Disease

## 2019-09-13 ENCOUNTER — Other Ambulatory Visit: Payer: Self-pay

## 2019-09-13 VITALS — BP 144/76 | HR 97 | Temp 98.3°F | Ht 59.0 in | Wt 161.2 lb

## 2019-09-13 DIAGNOSIS — J4540 Moderate persistent asthma, uncomplicated: Secondary | ICD-10-CM | POA: Diagnosis not present

## 2019-09-13 MED ORDER — FLUTICASONE-SALMETEROL 250-50 MCG/DOSE IN AEPB: 2.0000 | INHALATION_SPRAY | Freq: Two times a day (BID) | RESPIRATORY_TRACT | 5 refills | Status: DC

## 2019-09-13 NOTE — Progress Notes (Signed)
Subjective:   PATIENT ID: Tammie [name] GENDER: female DOB: Feb 05, 1961, MRN: [id number]   HPI  Chief Complaint  Patient presents with  . Follow-up    1 year rov asthma.  pt c/o sob with exertion.      Reason for Visit: Follow-up for asthma  [name] is a 59 year old female never smoker with asthma and hx of cardiac arrest in 2015 who presents for follow-up.  She was last seen in March 2020. Since then, she has been started on Dupixent with Allergy. She has tolerated biologic since October 2020. She has been compliant with Wixela one puff twice a day. Her cough and wheezing has improved since starting Dupixent however she still has dyspnea on exertion after mild-moderate activity. Still has nocturnal symptoms at least three times a week. Her symptoms get aggravated by trees, pollen and carpet exposure. Last asthma exacerbation was one year ago requiring outpatient steroids with me.   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  Currently disability  Environmental exposures: Cleaning supplies  I have personally reviewed patient's past medical/family/social history, allergies, current medications.  Past Medical History:  Diagnosis Date  . Allergy   . Anemia   . Anxiety   . Asthma   . Eczema   . Environmental allergies   . GERD (gastroesophageal reflux disease)    Patient denies  . Headache(784.0)   . Hypertension   . Respiratory failure, acute ([place]) 04/19/2014  . Shortness of breath      Family History  Problem Relation Age of Onset  . Heart attack Father   . Heart attack Mother   . Diabetes Sister   . High blood pressure Brother   . Hypertension Brother   . High blood pressure Sister   . Cancer Sister   . High blood pressure Son   . Cancer Maternal Grandmother   . Heart disease Paternal Grandmother   . Pancreatic cancer Paternal Grandmother   . Asthma Other        Great paternal grandfather   . Autism Son    . Asthma Other        Neice   . Colon cancer Neg Hx   . Esophageal cancer Neg Hx   . Rectal cancer Neg Hx   . Stomach cancer Neg Hx   . Allergic rhinitis Neg Hx   . Angioedema Neg Hx   . Eczema Neg Hx   . Immunodeficiency Neg Hx   . Atopy Neg Hx      Social History   Occupational History  . Not on file  Tobacco Use  . Smoking status: Never Smoker  . Smokeless tobacco: Never Used  Substance and Sexual Activity  . Alcohol use: No  . Drug use: No  . Sexual activity: Never    Allergies  Allergen Reactions  . Ace Inhibitors Other (See Comments)    Angioedema  . Aspirin Anaphylaxis, Swelling and Other (See Comments)    Throat swelling   . Ibuprofen Anaphylaxis, Swelling and Other (See Comments)    Throat swelling  . Nsaids Anaphylaxis, Swelling and Other (See Comments)  . Lipitor [Atorvastatin] Swelling and Other (See Comments)    Face, feet swell     Outpatient Medications Prior to Visit  Medication Sig Dispense Refill  . acetaminophen (TYLENOL) 160 MG/5ML suspension Take by mouth every 6 (six) hours as needed.    Marland Kitchen albuterol (VENTOLIN HFA) 108 (90 Base) MCG/ACT inhaler INHALE  2 PUFFS INTO THE LUNGS EVERY 6 HOURS AS NEEDED FOR WHEEZING OR SHORTNESS OF BREATH 18 g 5  . amLODipine (NORVASC) 10 MG tablet TAKE 1 TABLET BY MOUTH EVERY DAY FOR HIGH BLOOD PRESSURE 90 tablet 1  . bisoprolol (ZEBETA) 5 MG tablet TAKE 1/2 TABLET BY MOUTH EVERY DAY FOR HIGH BLOOD PRESSURE 45 tablet 1  . cetirizine (ZYRTEC) 10 MG tablet TAKE 1 TABLET(10 MG) BY MOUTH DAILY 30 tablet 5  . EPINEPHrine 0.3 mg/0.3 mL IJ SOAJ injection     . fluticasone (FLONASE) 50 MCG/ACT nasal spray SHAKE LIQUID AND USE 2 SPRAYS IN EACH NOSTRIL DAILY 48 g 3  . Fluticasone-Salmeterol (WIXELA INHUB) 250-50 MCG/DOSE AEPB Inhale 1 puff into the lungs 2 (two) times daily.    . hydrALAZINE (APRESOLINE) 25 MG tablet Take one tablet by mouth every 8 hours 90 tablet 3  . Omega-3 Fatty Acids (FISH OIL) 500 MG CAPS Take 1  capsule by mouth daily.    Marland Kitchen triamcinolone ointment (KENALOG) 0.5 % Apply 1 application topically 2 (two) times daily. 120 g 3  . Fluticasone-Salmeterol (WIXELA INHUB) 250-50 MCG/DOSE AEPB Inhale 1 puff into the lungs 2 (two) times daily. 60 each 5   Facility-Administered Medications Prior to Visit  Medication Dose Route Frequency Provider Last Rate Last Admin  . dupilumab (DUPIXENT) prefilled syringe 300 mg  300 mg Subcutaneous Q14 Days Valentina Shaggy, MD   300 mg at 09/01/19 1006    Review of Systems  Constitutional: Negative for chills, diaphoresis, fever, malaise/fatigue and weight loss.  HENT: Negative for congestion.   Respiratory: Positive for shortness of breath. Negative for cough, hemoptysis, sputum production and wheezing.   Cardiovascular: Negative for chest pain, palpitations and leg swelling.     Objective:   Vitals:   09/13/19 1459  BP: (!) 144/76  Pulse: 97  Temp: 98.3 F (36.8 C)  TempSrc: Temporal  SpO2: 98%  Weight: 161 lb 3.2 oz (73.1 kg)  Height: 4\' 11"  (1.499 m)   SpO2: 98 % O2 Device: None (Room air)  Physical Exam: General: Well-appearing, no acute distress HENT: [place], AT Eyes: EOMI, no scleral icterus Respiratory: Clear to auscultation bilaterally.  No crackles, wheezing or rales Cardiovascular: RRR, -M/R/G, no JVD Extremities:-Edema,-tenderness Neuro: AAO x4, CNII-XII grossly intact Skin: Intact, no rashes or bruising Psych: Normal mood, normal affect  Data Reviewed:  Imaging: CXR 04/19/14 - Mildly enlarged cardiac silhouette. No infiltrate   PFT: Spirometry 06/22/19 FEV1/FVC 67% FVC 46% FEV1 40%  Labs: CBC wit diff on 09/15/18 reviewed. Absolute eosinophil 462 (6.7%)  TTE: 03/17/18 EF 65-70%, no regional WMA, grade 1 diastolic dysfunction    Assessment & Plan:   Discussion: 59 year old never smoker with severe persistent asthma who presents for follow-up. Currently on Dupixent with improved symptom control compared to our  last visit however still has nocturnal dyspnea and dyspnea on exertion that limits activity.   Moderate-Severe Persistent Asthma - uncontrolled --INCREASE Wixela 250-50 to TWO puffs TWICE a day --CONTINUE Albuterol as needed every 4 hours for shortness of breath or wheezing --Keep follow-up with your Allergist for Dupixent management --Follow-up in 6 months  Allergic Rhinitis --CONTINUE Flonase 1 spray per nostril --CONTINUE zyrtec 10 mg daily   No orders of the defined types were placed in this encounter.  Meds ordered this encounter  Medications  . Fluticasone-Salmeterol (WIXELA INHUB) 250-50 MCG/DOSE AEPB    Sig: Inhale 2 puffs into the lungs 2 (two) times daily.    Dispense:  120 each    Refill:  5    Return in about 6 months (around 03/12/2020) for with NP for follow-up.  I have spent a total time of 36 -minutes on the day of the appointment reviewing prior documentation, coordinating care and discussing medical diagnosis and plan with the patient/family. Imaging, labs and tests included in this note have been reviewed and interpreted independently by me.   West Lealman, MD [facility] 09/13/2019 3:07 PM  Office Number [phone]

## 2019-09-13 NOTE — Patient Instructions (Addendum)
Moderate-Severe Persistent Asthma --INCREASE Wixela 250-50 to TWO puffs TWICE a day --CONTINUE Albuterol as needed every 4 hours for shortness of breath or wheezing --Keep follow-up with your Allergist for Dupixent management --Follow-up in 6 months  Allergic Rhinitis --CONTINUE Flonase 1 spray per nostril --CONTINUE zyrtec 10 mg daily   Follow-up with NP for follow-up

## 2019-09-14 ENCOUNTER — Other Ambulatory Visit: Payer: Self-pay | Admitting: Nurse Practitioner

## 2019-09-14 DIAGNOSIS — I10 Essential (primary) hypertension: Secondary | ICD-10-CM

## 2019-09-15 ENCOUNTER — Ambulatory Visit (INDEPENDENT_AMBULATORY_CARE_PROVIDER_SITE_OTHER): Payer: Medicare HMO

## 2019-09-15 ENCOUNTER — Other Ambulatory Visit: Payer: Self-pay

## 2019-09-15 DIAGNOSIS — J4540 Moderate persistent asthma, uncomplicated: Secondary | ICD-10-CM | POA: Diagnosis not present

## 2019-09-29 ENCOUNTER — Ambulatory Visit (INDEPENDENT_AMBULATORY_CARE_PROVIDER_SITE_OTHER): Payer: Medicare HMO | Admitting: *Deleted

## 2019-09-29 ENCOUNTER — Other Ambulatory Visit: Payer: Self-pay

## 2019-09-29 DIAGNOSIS — J4540 Moderate persistent asthma, uncomplicated: Secondary | ICD-10-CM | POA: Diagnosis not present

## 2019-10-13 ENCOUNTER — Ambulatory Visit (INDEPENDENT_AMBULATORY_CARE_PROVIDER_SITE_OTHER): Payer: Medicare HMO | Admitting: *Deleted

## 2019-10-13 ENCOUNTER — Other Ambulatory Visit: Payer: Self-pay

## 2019-10-13 DIAGNOSIS — J4540 Moderate persistent asthma, uncomplicated: Secondary | ICD-10-CM

## 2019-10-14 ENCOUNTER — Telehealth: Payer: Self-pay | Admitting: *Deleted

## 2019-10-14 NOTE — Telephone Encounter (Signed)
People 49 and older can get a COVID-19 vaccination from [facility].   [facility] [place] in [place] by appointment only. This will be for anyone 53 and older. People can be vaccinated at this time only if they register in advance. That can be done online at [email or web address] or by calling [phone]. You do not have to be a resident of [facility] to be vaccinated.  The vaccinations will move to the [facility] eventually due to a need for more space.    The [facility]) is pleased to announce that COVID-19 vaccinations will be available to Phase 1B adults who are 51 years or older regardless of health status or living situation. Those in this group can make a vaccination appointment by calling [phone] and selecting Option 2 beginning Friday, January 8 at 8:00 AM. Appointments are required.

## 2019-10-27 ENCOUNTER — Other Ambulatory Visit: Payer: Self-pay

## 2019-10-27 ENCOUNTER — Ambulatory Visit (INDEPENDENT_AMBULATORY_CARE_PROVIDER_SITE_OTHER): Payer: Medicare HMO

## 2019-10-27 DIAGNOSIS — J4540 Moderate persistent asthma, uncomplicated: Secondary | ICD-10-CM

## 2019-11-10 ENCOUNTER — Ambulatory Visit (INDEPENDENT_AMBULATORY_CARE_PROVIDER_SITE_OTHER): Payer: Medicare HMO | Admitting: Nurse Practitioner

## 2019-11-10 ENCOUNTER — Other Ambulatory Visit: Payer: Self-pay

## 2019-11-10 ENCOUNTER — Encounter: Payer: Self-pay | Admitting: Nurse Practitioner

## 2019-11-10 VITALS — BP 138/88 | HR 82 | Temp 97.5°F | Ht 59.0 in | Wt 160.6 lb

## 2019-11-10 DIAGNOSIS — F411 Generalized anxiety disorder: Secondary | ICD-10-CM

## 2019-11-10 DIAGNOSIS — I10 Essential (primary) hypertension: Secondary | ICD-10-CM | POA: Diagnosis not present

## 2019-11-10 DIAGNOSIS — R002 Palpitations: Secondary | ICD-10-CM | POA: Diagnosis not present

## 2019-11-10 DIAGNOSIS — R0789 Other chest pain: Secondary | ICD-10-CM | POA: Diagnosis not present

## 2019-11-10 DIAGNOSIS — E782 Mixed hyperlipidemia: Secondary | ICD-10-CM | POA: Diagnosis not present

## 2019-11-10 DIAGNOSIS — J4540 Moderate persistent asthma, uncomplicated: Secondary | ICD-10-CM | POA: Diagnosis not present

## 2019-11-10 DIAGNOSIS — J302 Other seasonal allergic rhinitis: Secondary | ICD-10-CM | POA: Diagnosis not present

## 2019-11-10 DIAGNOSIS — R739 Hyperglycemia, unspecified: Secondary | ICD-10-CM | POA: Diagnosis not present

## 2019-11-10 MED ORDER — HYDRALAZINE HCL 25 MG PO TABS: ORAL_TABLET | ORAL | 1 refills | Status: DC

## 2019-11-10 NOTE — Patient Instructions (Addendum)
To cut back on soda, drink decaffeinated drinks as this can effect heart Also recommend drinks with NO or LOW sugar as you are pre-diabetic    Referral placed to cardiologist for evaluation of heart beats and tightness   Angina is extreme discomfort in the chest, neck, arm, jaw, or back. The discomfort is caused by a lack of blood in the middle layer of the heart wall (myocardium). There are four types of angina:  Stable angina. This is triggered by vigorous activity or exercise. It goes away when you rest or take angina medicine.  Unstable angina. This is a warning sign and can lead to a heart attack (acute coronary syndrome). This is a medical emergency. Symptoms come at rest and last a long time.  Microvascular angina. This affects the small coronary arteries. Symptoms include feeling tired and being short of breath.  Prinzmetal or variant angina. This is caused by a tightening (spasm) of the arteries that go to your heart. What are the causes? This condition is caused by atherosclerosis. This is the buildup of fat and cholesterol (plaque) in your arteries. The plaque may narrow or block the artery. Other causes of angina include:  Sudden tightening of the muscles of the arteries in the heart (coronary spasm).  Small artery disease (microvascular dysfunction).  Problems with any of your heart valves (heart valve disease).  A tear in an artery in your heart (coronary artery dissection).  Diseases of the heart muscle (cardiomyopathy), or other heart diseases. What increases the risk? You are more likely to develop this condition if you have:  High cholesterol.  High blood pressure (hypertension).  Diabetes.  A family history of heart disease.  An inactive (sedentary) lifestyle, or you do not exercise enough.  Depression.  Had radiation treatment to the left side of your chest. Other risk factors include:  Using tobacco.  Being obese.  Eating a diet high in  saturated fats.  Being exposed to high stress or triggers of stress.  Using drugs, such as cocaine. Women have a greater risk for angina if:  They are older than 65.  They have gone through menopause (are postmenopausal). What are the signs or symptoms? Common symptoms of this condition in both men and women may include:  Chest pain, which may: ? Feel like a crushing or squeezing in the chest, or like a tightness, pressure, fullness, or heaviness in the chest. ? Last for more than a few minutes at a time, or it may stop and come back (recur) over the course of a few minutes.  Pain in the neck, arm, jaw, or back.  Unexplained heartburn or indigestion.  Shortness of breath.  Nausea.  Sudden cold sweats. Women and people with diabetes may have unusual (atypical) symptoms, such as:  Fatigue.  Unexplained feelings of nervousness or anxiety.  Unexplained weakness.  Dizziness or fainting. How is this diagnosed? This condition may be diagnosed based on:  Your symptoms and medical history.  Electrocardiogram (ECG) to measure the electrical activity in your heart.  Blood tests.  Stress test to look for signs of blockage when your heart is stressed.  CT angiogram to examine your heart and the blood flow to it.  Coronary angiogram to check your coronary arteries for blockage. How is this treated? Angina may be treated with:  Medicines to: ? Prevent blood clots and heart attack. ? Relax blood vessels and improve blood flow to the heart (nitrates). ? Reduce blood pressure, improve the pumping action of  the heart, and relax blood vessels that are spasming. ? Reduce cholesterol and help treat atherosclerosis.  A procedure to widen a narrowed or blocked coronary artery (angioplasty). A mesh tube may be placed in a coronary artery to keep it open (coronary stenting).  Surgery to allow blood to go around a blocked artery (coronary artery bypass surgery). Follow these  instructions at home: Medicines  Take over-the-counter and prescription medicines only as told by your health care provider.  Do not take the following medicines unless your health care provider approves: ? NSAIDs, such as ibuprofen or naproxen. ? Vitamin supplements that contain vitamin A, vitamin E, or both. ? Hormone replacement therapy that contains estrogen with or without progestin. Eating and drinking   Eat a heart-healthy diet. This includes plenty of fresh fruits and vegetables, whole grains, low-fat (lean) protein, and low-fat dairy products.  Follow instructions from your health care provider about eating or drinking restrictions. Activity  Follow an exercise program approved by your health care provider.  Consider joining a cardiac rehabilitation program.  Take a break when you feel fatigued. Plan rest periods in your daily activities. Lifestyle   Do not use any products that contain nicotine or tobacco, such as cigarettes, e-cigarettes, and chewing tobacco. If you need help quitting, ask your health care provider.  If your health care provider says you can drink alcohol: ? Limit how much you use to:  0-1 drink a day for nonpregnant women.  0-2 drinks a day for men. ? Be aware of how much alcohol is in your drink. In the [place] one drink equals one 12 oz bottle of beer (355 mL), one 5 oz glass of wine (148 mL), or one 1 oz glass of hard liquor (44 mL). General instructions  Maintain a healthy weight.  Learn to manage stress.  Keep your vaccinations up to date. Get the flu (influenza) vaccine every year.  Talk to your health care provider if you feel depressed. Take a depression screening test to see if you are at risk for depression.  Work with your health care provider to manage other health conditions, such as hypertension or diabetes.  Keep all follow-up visits as told by your health care provider. This is important. Get help right away if:  You have  pain in your chest, neck, arm, jaw, or back, and the pain: ? Lasts more than a few minutes. ? Is recurring. ? Is not relieved by taking medicines under the tongue (sublingual nitroglycerin). ? Increases in intensity or frequency.  You have a lot of sweating without cause.  You have unexplained: ? Heartburn or indigestion. ? Shortness of breath or difficulty breathing. ? Nausea or vomiting. ? Fatigue. ? Feelings of nervousness or anxiety. ? Weakness.  You have sudden light-headedness or dizziness.  You faint. These symptoms may represent a serious problem that is an emergency. Do not wait to see if the symptoms will go away. Get medical help right away. Call your local emergency services (911 in the [place] Do not drive yourself to the hospital. Summary  Angina is extreme discomfort in the chest, neck, arm, jaw, or back that is caused by a lack of blood in the heart wall.  There are many symptoms of angina. They include chest pain, unexplained heartburn or indigestion, sudden cold sweats, and fatigue.  Angina may be treated with behavioral changes, medicine, or surgery.  Symptoms of angina may represent an emergency. Get medical help right away. Call your local emergency services (911  in the [place] Do not drive yourself to the hospital. This information is not intended to replace advice given to you by your health care provider. Make sure you discuss any questions you have with your health care provider. Document Revised: 03/09/2018 Document Reviewed: 03/09/2018 Elsevier Patient Education  [place].

## 2019-11-10 NOTE — Progress Notes (Signed)
Careteam: Patient Care Team: [name], NP as PCP - General (Geriatric Medicine) [name], MD as Consulting Physician (Ophthalmology)  PLACE OF SERVICE:  [place]  Advanced Directive information    Allergies  Allergen Reactions  . Ace Inhibitors Other (See Comments)    Angioedema  . Aspirin Anaphylaxis, Swelling and Other (See Comments)    Throat swelling   . Ibuprofen Anaphylaxis, Swelling and Other (See Comments)    Throat swelling  . Nsaids Anaphylaxis, Swelling and Other (See Comments)  . Lipitor [Atorvastatin] Swelling and Other (See Comments)    Face, feet swell    Chief Complaint  Patient presents with  . Medical Management of Chronic Issues    4 month follow-up   . Chest Pain    Patient c/o chest pain off/on x several months.   . Medication Management    Discuss Hydralizine      HPI: Patient is a 59 y.o. female for routine follow up.  Reports she is doing well except she has occasional palpitations, reports she could feel her heartbeat and chest felt tight. One episode a month ago and then another 2 weeks ago. Lasted about 3 hours. Reports she was sitting down watching TV. Family hx of MI, both mother and father had heart disease.  Reports she will notice it when she has more activity as well. Has to stop walking sometimes and rest. Pt also with hx of asthma but states she did not have increase in shortness of breath with these events.  Contributes it to weight gain because when she was lighter did not have episode.   Asthma- continues to get [facility]. Following with pulmonary as well.   HTN- does not check blood pressure at home. Has taken medication today, continues on norvasc,bystolic and hydralazine (but only uses twice daily)  Hyperglycemia- a1c was elevated to 6.4 on labs 4 months ago, discussed with her about changing diet and increasing physical activity at last visit. Continues to have a high  sugar diet.  Drinks at least a liter of pepsi a day Does not walk  Hyperlipidemia-triglycerides elevated on last labs, continues with poor dietary choices- could not take Lipitor in the past due to swelling   Review of Systems:  Review of Systems  Constitutional: Positive for malaise/fatigue. Negative for chills, fever and weight loss.  HENT: Positive for congestion. Negative for tinnitus.   Respiratory: Positive for shortness of breath. Negative for cough and sputum production.   Cardiovascular: Positive for palpitations. Negative for chest pain and leg swelling.       Chest tightness noted in the past month  Gastrointestinal: Negative for abdominal pain, constipation, diarrhea and heartburn.  Genitourinary: Negative for dysuria, frequency and urgency.  Musculoskeletal: Negative for back pain, falls, joint pain and myalgias.  Skin: Negative.   Neurological: Negative for dizziness and headaches.  Endo/Heme/Allergies: Positive for environmental allergies.  Psychiatric/Behavioral: Negative for depression and memory loss. The patient is not nervous/anxious and does not have insomnia.     Past Medical History:  Diagnosis Date  . Allergy   . Anemia   . Anxiety   . Asthma   . Eczema   . Environmental allergies   . GERD (gastroesophageal reflux disease)    Patient denies  . Headache(784.0)   . Hypertension   . Respiratory failure, acute ([place]) 04/19/2014  . Shortness of breath    Past Surgical History:  Procedure Laterality Date  . ABDOMINAL HYSTERECTOMY  [place] SINUS SURGERY     Polyps   Social History:   reports that she has never smoked. She has never used smokeless tobacco. She reports that she does not drink alcohol or use drugs.  Family History  Problem Relation Age of Onset  . Heart attack Father   . Heart attack Mother   . Diabetes Sister   . High blood pressure Brother   . Hypertension Brother   . Asthma Brother   . High blood  pressure Sister   . Cancer Sister   . High blood pressure Son   . Cancer Maternal Grandmother   . Heart disease Paternal Grandmother   . Pancreatic cancer Paternal Grandmother   . Asthma Other        Great paternal grandfather   . Autism Son   . Asthma Other        Neice   . Colon cancer Neg Hx   . Esophageal cancer Neg Hx   . Rectal cancer Neg Hx   . Stomach cancer Neg Hx   . Allergic rhinitis Neg Hx   . Angioedema Neg Hx   . Eczema Neg Hx   . Immunodeficiency Neg Hx   . Atopy Neg Hx     Medications: Patient's Medications  New Prescriptions   No medications on file  Previous Medications   ACETAMINOPHEN (TYLENOL) 160 MG/5ML SUSPENSION    Take by mouth every 6 (six) hours as needed.   ALBUTEROL (VENTOLIN HFA) 108 (90 BASE) MCG/ACT INHALER    INHALE 2 PUFFS INTO THE LUNGS EVERY 6 HOURS AS NEEDED FOR WHEEZING OR SHORTNESS OF BREATH   AMLODIPINE (NORVASC) 10 MG TABLET    TAKE 1 TABLET BY MOUTH EVERY DAY FOR HIGH BLOOD PRESSURE   BISOPROLOL (ZEBETA) 5 MG TABLET    TAKE 1/2 TABLET BY MOUTH EVERY DAY FOR HIGH BLOOD PRESSURE   CETIRIZINE (ZYRTEC) 10 MG TABLET    TAKE 1 TABLET(10 MG) BY MOUTH DAILY   EPINEPHRINE 0.3 MG/0.3 ML IJ SOAJ INJECTION       FLUTICASONE (FLONASE) 50 MCG/ACT NASAL SPRAY    SHAKE LIQUID AND USE 2 SPRAYS IN EACH NOSTRIL DAILY   FLUTICASONE-SALMETEROL (WIXELA INHUB) 250-50 MCG/DOSE AEPB    Inhale 2 puffs into the lungs 2 (two) times daily.   HYDRALAZINE (APRESOLINE) 25 MG TABLET    TAKE 1 TABLET BY MOUTH EVERY 8 HOURS   OMEGA-3 FATTY ACIDS (FISH OIL) 500 MG CAPS    Take 1 capsule by mouth daily.   TRIAMCINOLONE OINTMENT (KENALOG) 0.5 %    Apply 1 application topically 2 (two) times daily.  Modified Medications   No medications on file  Discontinued Medications   No medications on file    Physical Exam:  Vitals:   11/10/19 0924  BP: (!) 142/88  Pulse: 82  Temp: (!) 97.5 F (36.4 C)  TempSrc: Temporal  SpO2: 96%  Weight: 160 lb 9.6 oz (72.8 kg)    Height: 4\' 11"  (1.499 m)   Body mass index is 32.44 kg/m. Wt Readings from Last 3 Encounters:  11/10/19 160 lb 9.6 oz (72.8 kg)  09/13/19 161 lb 3.2 oz (73.1 kg)  07/12/19 157 lb (71.2 kg)    Physical Exam Constitutional:      General: She is not in acute distress.    Appearance: She is well-developed. She is not diaphoretic.  HENT:     Head: Normocephalic and atraumatic.  Nose: Congestion present.     Mouth/Throat:     Pharynx: No oropharyngeal exudate.  Eyes:     Conjunctiva/sclera: Conjunctivae normal.     Pupils: Pupils are equal, round, and reactive to light.  Cardiovascular:     Rate and Rhythm: Normal rate and regular rhythm.     Heart sounds: Normal heart sounds.  Pulmonary:     Effort: Pulmonary effort is normal.     Breath sounds: Normal breath sounds.  Abdominal:     General: Bowel sounds are normal.     Palpations: Abdomen is soft.  Musculoskeletal:        General: No tenderness.     Cervical back: Normal range of motion and neck supple.  Skin:    General: Skin is warm and dry.  Neurological:     Mental Status: She is alert and oriented to person, place, and time.  Psychiatric:        Mood and Affect: Mood normal.        Behavior: Behavior normal.     Labs reviewed: Basic Metabolic Panel: Recent Labs    03/12/19 0941 07/12/19 1107  NA 143 141  K 3.4* 3.3*  CL 101 103  CO2 30 30  GLUCOSE 117* 111*  BUN 8 10  CREATININE 0.98 0.98  CALCIUM 9.6 9.9   Liver Function Tests: Recent Labs    03/12/19 0941  AST 26  ALT 27  BILITOT 0.8  PROT 7.2   No results for input(s): LIPASE, AMYLASE in the last 8760 hours. No results for input(s): AMMONIA in the last 8760 hours. CBC: Recent Labs    03/12/19 0941  WBC 6.4  NEUTROABS 3,354  HGB 14.1  HCT 43.1  MCV 80.7  PLT 326   Lipid Panel: Recent Labs    03/12/19 0941 07/12/19 1107  CHOL 147 168  HDL 36* 40*  LDLCALC 79 97  TRIG 230* 216*  CHOLHDL 4.1 4.2   TSH: No results for  input(s): TSH in the last 8760 hours. A1C: Lab Results  Component Value Date   HGBA1C 6.4 (H) 07/12/2019     Assessment/Plan 1. Generalized anxiety disorder Stable at this time. Not currently on medication, denies increase in symptoms.   2. Chronic seasonal allergic rhinitis -slightly worse at this time due to increase in pollen but overall stable on current regimen.   3. Moderate persistent asthma without complication -stable with fluticasone-salmeterol and dupizent injections   4. Essential hypertension Improved on recheck, encouraged her to take medication as prescribed- to take hydralazine three times daily- only taking twice. Continues on bisoprolol, and amlodipine.  - CBC with Differential/Platelet - COMPLETE METABOLIC PANEL WITH GFR  5. Mixed hyperlipidemia Triglycerides elevated on last labs, encouraged modification to diet. Continues on fish oil.   6. Hyperglycemia -she is noncompliant with diet, reports drinking significant amounts of sugar daily,educated her again on this.  - Hemoglobin A1c  7. Chest pressure - Ambulatory referral to Cardiology for further evaluation at this time, strong family hx of CAD, discussed when to go to the ED and information provided, last episode of pressure/palpitations was over 2 weeks ago. She has chronic shortness of breath due to asthma but has not reported significant worsening of symptoms associated with events.  She has allergy to ASA and Statin. - EKG 12-Lead- consistent with previous readings, NSR today - TSH - CBC with Differential/Platelet - COMPLETE METABOLIC PANEL WITH GFR  8. Palpitations - Ambulatory referral to Cardiology - EKG 12-Lead -  TSH - CBC with Differential/Platelet - COMPLETE METABOLIC PANEL WITH GFR  Next appt: 3 months, sooner if needed  Paralee Pendergrass K. Cuming, [facility] [phone]

## 2019-11-11 ENCOUNTER — Ambulatory Visit (INDEPENDENT_AMBULATORY_CARE_PROVIDER_SITE_OTHER): Payer: Medicare HMO

## 2019-11-11 DIAGNOSIS — J4540 Moderate persistent asthma, uncomplicated: Secondary | ICD-10-CM

## 2019-11-11 LAB — COMPLETE METABOLIC PANEL WITHOUT GFR
AG Ratio: 1.2 (calc) (ref 1.0–2.5)
ALT: 15 U/L (ref 6–29)
AST: 16 U/L (ref 10–35)
Albumin: 4 g/dL (ref 3.6–5.1)
Alkaline phosphatase (APISO): 108 U/L (ref 37–153)
BUN/Creatinine Ratio: 9 (calc) (ref 6–22)
BUN: 10 mg/dL (ref 7–25)
CO2: 31 mmol/L (ref 20–32)
Calcium: 9.8 mg/dL (ref 8.6–10.4)
Chloride: 105 mmol/L (ref 98–110)
Creat: 1.11 mg/dL — ABNORMAL HIGH (ref 0.50–0.99)
GFR, Est African American: 63 mL/min/1.73m2 (ref >=60)
GFR, Est Non African American: 54 mL/min/1.73m2 — ABNORMAL LOW (ref >=60)
Globulin: 3.4 g/dL (ref 1.9–3.7)
Glucose, Bld: 120 mg/dL — ABNORMAL HIGH (ref 65–99)
Potassium: 4 mmol/L (ref 3.5–5.3)
Sodium: 143 mmol/L (ref 135–146)
Total Bilirubin: 0.7 mg/dL (ref 0.2–1.2)
Total Protein: 7.4 g/dL (ref 6.1–8.1)

## 2019-11-11 LAB — CBC WITH DIFFERENTIAL/PLATELET
Absolute Monocytes: 626 {cells}/uL (ref 200–950)
Basophils Absolute: 82 {cells}/uL (ref 0–200)
Basophils Relative: 1.2 %
Eosinophils Absolute: 843 {cells}/uL — ABNORMAL HIGH (ref 15–500)
Eosinophils Relative: 12.4 %
HCT: 40.5 % (ref 35.0–45.0)
Hemoglobin: 13.5 g/dL (ref 11.7–15.5)
Lymphs Abs: 1972 {cells}/uL (ref 850–3900)
MCH: 27.3 pg (ref 27.0–33.0)
MCHC: 33.3 g/dL (ref 32.0–36.0)
MCV: 81.8 fL (ref 80.0–100.0)
MPV: 9.8 fL (ref 7.5–12.5)
Monocytes Relative: 9.2 %
Neutro Abs: 3278 {cells}/uL (ref 1500–7800)
Neutrophils Relative %: 48.2 %
Platelets: 315 Thousand/uL (ref 140–400)
RBC: 4.95 Million/uL (ref 3.80–5.10)
RDW: 13.1 % (ref 11.0–15.0)
Total Lymphocyte: 29 %
WBC: 6.8 Thousand/uL (ref 3.8–10.8)

## 2019-11-11 LAB — HEMOGLOBIN A1C
Hgb A1c MFr Bld: 6.7 %{Hb} — ABNORMAL HIGH (ref <5.7)
Mean Plasma Glucose: 146 (calc)
eAG (mmol/L): 8.1 (calc)

## 2019-11-11 LAB — TSH: TSH: 1.96 m[IU]/L (ref 0.40–4.50)

## 2019-11-12 ENCOUNTER — Other Ambulatory Visit: Payer: Self-pay

## 2019-11-12 NOTE — Progress Notes (Deleted)
Patient called and notified to disregard lab appointment date and to come into office appointment date that was scheduled for follow up. Future lab order deleted. Encounter signed and closed.

## 2019-11-12 NOTE — Progress Notes (Signed)
We will do lab work at next office visit.

## 2019-11-25 ENCOUNTER — Other Ambulatory Visit: Payer: Self-pay

## 2019-11-25 ENCOUNTER — Ambulatory Visit (INDEPENDENT_AMBULATORY_CARE_PROVIDER_SITE_OTHER): Payer: Medicare HMO

## 2019-11-25 DIAGNOSIS — J4540 Moderate persistent asthma, uncomplicated: Secondary | ICD-10-CM | POA: Diagnosis not present

## 2019-11-29 ENCOUNTER — Other Ambulatory Visit: Payer: Self-pay

## 2019-11-29 ENCOUNTER — Ambulatory Visit: Payer: Medicare HMO | Admitting: Cardiology

## 2019-11-29 ENCOUNTER — Encounter: Payer: Self-pay | Admitting: Cardiology

## 2019-11-29 VITALS — BP 120/71 | HR 74 | Temp 97.2°F | Ht 59.0 in | Wt 158.0 lb

## 2019-11-29 DIAGNOSIS — E8881 Metabolic syndrome: Secondary | ICD-10-CM | POA: Insufficient documentation

## 2019-11-29 DIAGNOSIS — R079 Chest pain, unspecified: Secondary | ICD-10-CM | POA: Diagnosis not present

## 2019-11-29 DIAGNOSIS — I351 Nonrheumatic aortic (valve) insufficiency: Secondary | ICD-10-CM

## 2019-11-29 DIAGNOSIS — E782 Mixed hyperlipidemia: Secondary | ICD-10-CM | POA: Diagnosis not present

## 2019-11-29 DIAGNOSIS — I10 Essential (primary) hypertension: Secondary | ICD-10-CM | POA: Diagnosis not present

## 2019-11-29 DIAGNOSIS — R739 Hyperglycemia, unspecified: Secondary | ICD-10-CM | POA: Diagnosis not present

## 2019-11-29 NOTE — Patient Instructions (Addendum)
Medication Instructions:  No changes  *If you need a refill on your cardiac medications before your next appointment, please call your pharmacy*   Lab Work: Not needed   Testing/Procedures: Will need to have covid test 3 days prior to testing at [facility]- then self isolate until tests Will be schedule at [facility] has requested that you have an exercise tolerance test. Please also follow instruction sheet, as given.      Follow-Up: At [facility], you and your health needs are our priority.  As part of our continuing mission to provide you with exceptional heart care, we have created designated Provider Care Teams.  These Care Teams include your primary Cardiologist (physician) and Advanced Practice Providers (APPs -  Physician Assistants and Nurse Practitioners) who all work together to provide you with the care you need, when you need it.  We recommend signing up for the patient portal called "MyChart".  Sign up information is provided on this After Visit Summary.  MyChart is used to connect with patients for Virtual Visits (Telemedicine).  Patients are able to view lab/test results, encounter notes, upcoming appointments, etc.  Non-urgent messages can be sent to your provider as well.   To learn more about what you can do with MyChart, go to [email or web address].    Your next appointment:   1 month(s)  The format for your next appointment:   In Person  Provider:   Glenetta Hew, MD   Other Instructions n/a

## 2019-11-29 NOTE — Progress Notes (Signed)
Primary Care Provider: [name], NP Cardiologist: No primary care provider on file. Electrophysiologist: None  Clinic Note: Chief Complaint  Patient presents with  . New Patient (Initial Visit)  . Chest Pain    HPI:    Tammie [name] is a 59 y.o. female with history of relatively SEVERE ASTHMA, distant history of SYNCOPE, HTN, borderline DM-2, obesity and prior history of "CARDIOPULMONARY ARREST in 2015 "who is being seen today for the evaluation of CHEST PAIN, PALPITATIONS and DYSPNEA at the request of [name], NP.  Ericka Kelzer was seen on November 10, 2019 by Sherrie Mustache, NP with complaints of occasional palpitations where she felt her heartbeat forcefully with chest tightness.  Had noted about 2 episodes lasting 3 hours at a time.  Occurred while sitting down watching TV.  Because of family history of heart disease in both her father and mother, she was referred for cardiac evaluation.  Recent Hospitalizations: None  Reviewed  CV studies:    The following studies were reviewed today: (if available, images/films reviewed: From Epic Chart or Care Everywhere) . 03/17/2018-Echo: EF 55 to 60%.  Mild LVH.  GR 1 DD.  Mild AI and MR.  Mildly increased PA pressures. . 03/12/2016-Echo: EF 55 to 60%.  Mild LVH.  GR 1 DD.  Mild AI and MR.  Mildly increased PA pressures. . 04/16/2014-Echo (in setting of cardiopulmonary arrest): EF 65 to 70%.  Mild concentric LVH.  GR 1 DD.  Mild AI.    Interval History:   Tammie [name] presents here today to discuss the results of her chest pain episodes.  She says the most recent episode was about 3 weeks ago she felt as a heavy weight in her chest and pressure.  She did notice some shortness of breath with it.  This episode lasted a couple hours.  Was at rest.  2 days later, she had another episode again lasting about an hour or so.  Associated with tightness in her chest and dyspnea. She always has exertional dyspnea, but maybe has had  some slight increase in exertional dyspnea.  Usually when she gets short of breath she may notice a little bit of chest tightness.  This is is typically her symptom with asthma.  When she has a chest discomfort episodes she occasionally feels irregular heart beats and pounding but not prolonged spells.  She really has no PND, orthopnea or significant edema.  She says that the chest discomfort is occasionally worse with deep inspiration and when lying certain ways.  Cardiovascular Review of Symptoms (Summary): positive for - chest pain, dyspnea on exertion, palpitations and Chronic shortness of breath negative for - edema, irregular heartbeat, orthopnea, paroxysmal nocturnal dyspnea, rapid heart rate or Syncope/near syncope, TIA/amaurosis fugax, claudication  The patient does not have symptoms concerning for COVID-19 infection (fever, chills, cough, or new shortness of breath).  The patient is practicing social distancing & Masking.    REVIEWED OF SYSTEMS   Review of Systems  Constitutional: Positive for malaise/fatigue (Chronic). Negative for weight loss (Has put on quite a bit of weight since the COVID-19 lockdown).  HENT: Negative for nosebleeds.   Respiratory: Positive for cough, shortness of breath and wheezing. Negative for sputum production.   Cardiovascular: Positive for chest pain.  Gastrointestinal: Negative for blood in stool and melena.  Genitourinary: Negative for hematuria.  Musculoskeletal: Positive for joint pain.  Neurological: Positive for dizziness (Off and on, positional).  Psychiatric/Behavioral: Negative for memory loss. The patient is  not nervous/anxious and does not have insomnia.    I have reviewed and (if needed) personally updated the patient's problem list, medications, allergies, past medical and surgical history, social and family history.   PAST MEDICAL HISTORY   Past Medical History:  Diagnosis Date  . 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 Anemia   .  Anxiety   . Eczema   . Environmental allergies   . GERD (gastroesophageal reflux disease)    Patient denies  . Headache(784.0)   . Hypertension   . Moderate intermittent asthma   . Respiratory failure, acute ([place]) 04/19/2014   Related to potential Anaphylaxis ? presumably to ASA - exacerbated by Moderate to Severe Asthma   September 2015: Acute respiratory failure with respiratory acidosis thought to be related to anaphylactic reaction potentially to aspirin (initial presentation with cough congestion chest pain treated with aspirin and NTG)-> suffered cardiopulmonary arrest leading to intubation.Marland Kitchen  PAST SURGICAL HISTORY   Past Surgical History:  Procedure Laterality Date  . ABDOMINAL HYSTERECTOMY  1993   [facility]   . NASAL SINUS SURGERY     Polyps    MEDICATIONS/ALLERGIES   Current Meds  Medication Sig  . acetaminophen (TYLENOL) 160 MG/5ML suspension Take by mouth every 6 (six) hours as needed.  Marland Kitchen albuterol (VENTOLIN HFA) 108 (90 Base) MCG/ACT inhaler INHALE 2 PUFFS INTO THE LUNGS EVERY 6 HOURS AS NEEDED FOR WHEEZING OR SHORTNESS OF BREATH  . amLODipine (NORVASC) 10 MG tablet TAKE 1 TABLET BY MOUTH EVERY DAY FOR HIGH BLOOD PRESSURE  . bisoprolol (ZEBETA) 5 MG tablet TAKE 1/2 TABLET BY MOUTH EVERY DAY FOR HIGH BLOOD PRESSURE  . cetirizine (ZYRTEC) 10 MG tablet TAKE 1 TABLET(10 MG) BY MOUTH DAILY  . EPINEPHrine 0.3 mg/0.3 mL IJ SOAJ injection   . fluticasone (FLONASE) 50 MCG/ACT nasal spray SHAKE LIQUID AND USE 2 SPRAYS IN EACH NOSTRIL DAILY  . Fluticasone-Salmeterol (WIXELA INHUB) 250-50 MCG/DOSE AEPB Inhale 2 puffs into the lungs 2 (two) times daily.  . hydrALAZINE (APRESOLINE) 25 MG tablet TAKE 1 TABLET BY MOUTH EVERY 8 HOURS  . Omega-3 Fatty Acids (FISH OIL) 500 MG CAPS Take 1 capsule by mouth daily.  Marland Kitchen triamcinolone ointment (KENALOG) 0.5 % Apply 1 application topically 2 (two) times daily.   Current Facility-Administered Medications for the 11/29/19 encounter  (Office Visit) with Leonie Man, MD  Medication  . dupilumab (DUPIXENT) prefilled syringe 300 mg    Allergies  Allergen Reactions  . Ace Inhibitors Other (See Comments)    Angioedema  . Aspirin Anaphylaxis, Swelling and Other (See Comments)    Throat swelling   . Ibuprofen Anaphylaxis, Swelling and Other (See Comments)    Throat swelling  . Nsaids Anaphylaxis, Swelling and Other (See Comments)  . Lipitor [Atorvastatin] Swelling and Other (See Comments)    Face, feet swell    SOCIAL HISTORY/FAMILY HISTORY   Social History   Tobacco Use  . Smoking status: Never Smoker  . Smokeless tobacco: Never Used  Substance Use Topics  . Alcohol use: No  . Drug use: No   Social History   Social History Narrative   Diet-Salt Free   Drinks Soda-Caffeine    Lives in an apartment, one stories, one person in home, no pets   Current/past profession: Dietary, dish-room, and cooks    Exercises 2 x weekly   Family History  Problem Relation Age of Onset  . Heart attack Father   . Heart attack Mother   . Diabetes  Sister   . High blood pressure Brother   . Hypertension Brother   . Asthma Brother   . High blood pressure Sister   . Cancer Sister   . High blood pressure Son   . Cancer Maternal Grandmother   . Heart disease Paternal Grandmother   . Pancreatic cancer Paternal Grandmother   . Asthma Other        Great paternal grandfather   . Autism Son   . Asthma Other        Neice   . Colon cancer Neg Hx   . Esophageal cancer Neg Hx   . Rectal cancer Neg Hx   . Stomach cancer Neg Hx   . Allergic rhinitis Neg Hx   . Angioedema Neg Hx   . Eczema Neg Hx   . Immunodeficiency Neg Hx   . Atopy Neg Hx     OBJCTIVE -PE, EKG, labs   Wt Readings from Last 3 Encounters:  11/29/19 158 lb (71.7 kg)  11/10/19 160 lb 9.6 oz (72.8 kg)  09/13/19 161 lb 3.2 oz (73.1 kg)    Physical Exam: BP 120/71   Pulse 74   Temp (!) 97.2 F (36.2 C)   Ht 4\' 11"  (1.499 m)   Wt 158 lb (71.7  kg)   PF 96 L/min   BMI 31.91 kg/m  Physical Exam  Constitutional: She is oriented to person, place, and time. She appears well-developed and well-nourished. No distress.  Healthy-appearing.  Well-groomed  HENT:  Head: Normocephalic and atraumatic.  Eyes: Pupils are equal, round, and reactive to light. EOM are normal.  Neck: No JVD present.  Cardiovascular: Normal rate, regular rhythm and intact distal pulses. Exam reveals no gallop and no friction rub.  No murmur heard. Pulmonary/Chest: Effort normal. No respiratory distress. She exhibits tenderness (She does have some point tenderness along the costal sternal margin).  Mild diffuse expiratory wheezes.  No rales or rhonchi.  Abdominal: Soft. Bowel sounds are normal. She exhibits no distension. There is no abdominal tenderness. There is no rebound.  Musculoskeletal:        General: No edema. Normal range of motion.     Cervical back: Normal range of motion and neck supple.  Neurological: She is alert and oriented to person, place, and time.  Psychiatric: She has a normal mood and affect. Her behavior is normal. Judgment and thought content normal.  Vitals reviewed.    Adult ECG Report - > EKG 11/10/2019 shows normal EKG with normal rhythm.  Recent Labs:  n/a Lab Results  Component Value Date   CHOL 168 07/12/2019   HDL 40 (L) 07/12/2019   LDLCALC 97 07/12/2019   TRIG 216 (H) 07/12/2019   CHOLHDL 4.2 07/12/2019   Lab Results  Component Value Date   CREATININE 1.11 (H) 11/10/2019   BUN 10 11/10/2019   NA 143 11/10/2019   K 4.0 11/10/2019   CL 105 11/10/2019   CO2 31 11/10/2019   Lab Results  Component Value Date   TSH 1.96 11/10/2019    ASSESSMENT/PLAN    Problem List Items Addressed This Visit    Hypertension (Chronic)    Blood pressure relatively stable on amlodipine and bisoprolol along with hydralazine.      Aortic valve regurgitation (Chronic)    Minimal aortic insufficiency that has been stable since 2015  -> would probably not need further evaluation simply for aortic insufficiency.      Mixed hyperlipidemia (Chronic)    LDL is less than  100, and in the absence of any existing CAD probably okay ->  We may potentially end up doing a coronary CTA depending on GXT results.  If there is significant evidence of coronary calcification or CAD, would probably want to be more aggressive.  For now stable on on no meds.      Chest pain at rest - Primary (Chronic)    2 episodes of chest discomfort, no further episodes.  She does have some exertional discomfort and may be some mild discomfort in her chest.  Plan: We will evaluate with GXT, if not able to get good evaluation, would probably do Coronary CTA.      Relevant Orders   EXERCISE TOLERANCE TEST (ETT)   Hyperglycemia   Metabolic syndrome    Diabetes, obesity, hypertension, and hypertriglyceridemia = metabolic syndrome. . This is a risk equivalent for coronary disease, low threshold to escalate from GXT to coronary CT angiogram depending on results.      Relevant Orders   EXERCISE TOLERANCE TEST (ETT)      COVID-19 Education: The signs and symptoms of COVID-19 were discussed with the patient and how to seek care for testing (follow up with PCP or arrange E-visit).   The importance of social distancing and COVID-19 vaccination was discussed today.  I spent a total of 26 minutes with the patient. >  50% of the time was spent in direct patient consultation.  Additional time spent with chart review  / charting (studies, outside notes, etc): 10 Total Time: 36 min   Current medicines are reviewed at length with the patient today.  (+/- concerns) none  Notice: This dictation was prepared with Dragon dictation along with smaller phrase technology. Any transcriptional errors that result from this process are unintentional and may not be corrected upon review.  Patient Instructions / Medication Changes & Studies & Tests Ordered   Patient  Instructions  Medication Instructions:  No changes  *If you need a refill on your cardiac medications before your next appointment, please call your pharmacy*   Lab Work: Not needed   Testing/Procedures: Will need to have covid test 3 days prior to testing at [facility]- then self isolate until tests Will be schedule at [facility] has requested that you have an exercise tolerance test. Please also follow instruction sheet, as given.      Follow-Up: At [facility], you and your health needs are our priority.  As part of our continuing mission to provide you with exceptional heart care, we have created designated Provider Care Teams.  These Care Teams include your primary Cardiologist (physician) and Advanced Practice Providers (APPs -  Physician Assistants and Nurse Practitioners) who all work together to provide you with the care you need, when you need it.  We recommend signing up for the patient portal called "MyChart".  Sign up information is provided on this After Visit Summary.  MyChart is used to connect with patients for Virtual Visits (Telemedicine).  Patients are able to view lab/test results, encounter notes, upcoming appointments, etc.  Non-urgent messages can be sent to your provider as well.   To learn more about what you can do with MyChart, go to [email or web address].    Your next appointment:   1 month(s)  The format for your next appointment:   In Person  Provider:   Glenetta Hew, MD   Other Instructions n/a   Studies Ordered:   Orders Placed This Encounter  Procedures  .  EXERCISE TOLERANCE TEST (ETT)     Glenetta Hew, M.D., M.S. Interventional Cardiologist   Pager # [phone] Phone # [phone] [address]. [place], [place] [address]   Thank you for choosing Heartcare at [facility]!!

## 2019-12-05 ENCOUNTER — Encounter: Payer: Self-pay | Admitting: Cardiology

## 2019-12-05 NOTE — Assessment & Plan Note (Signed)
LDL is less than 100, and in the absence of any existing CAD probably okay ->  We may potentially end up doing a coronary CTA depending on GXT results.  If there is significant evidence of coronary calcification or CAD, would probably want to be more aggressive.  For now stable on on no meds.

## 2019-12-05 NOTE — Assessment & Plan Note (Signed)
2 episodes of chest discomfort, no further episodes.  She does have some exertional discomfort and may be some mild discomfort in her chest.  Plan: We will evaluate with GXT, if not able to get good evaluation, would probably do Coronary CTA.

## 2019-12-05 NOTE — Assessment & Plan Note (Signed)
Minimal aortic insufficiency that has been stable since 2015 -> would probably not need further evaluation simply for aortic insufficiency.

## 2019-12-05 NOTE — Assessment & Plan Note (Signed)
Diabetes, obesity, hypertension, and hypertriglyceridemia = metabolic syndrome. . This is a risk equivalent for coronary disease, low threshold to escalate from GXT to coronary CT angiogram depending on results.

## 2019-12-05 NOTE — Assessment & Plan Note (Signed)
Blood pressure relatively stable on amlodipine and bisoprolol along with hydralazine.

## 2019-12-09 ENCOUNTER — Ambulatory Visit (INDEPENDENT_AMBULATORY_CARE_PROVIDER_SITE_OTHER): Payer: Medicare HMO

## 2019-12-09 ENCOUNTER — Other Ambulatory Visit: Payer: Self-pay

## 2019-12-09 DIAGNOSIS — J4540 Moderate persistent asthma, uncomplicated: Secondary | ICD-10-CM

## 2019-12-10 ENCOUNTER — Other Ambulatory Visit: Payer: Self-pay | Admitting: Nurse Practitioner

## 2019-12-10 DIAGNOSIS — I10 Essential (primary) hypertension: Secondary | ICD-10-CM

## 2019-12-16 ENCOUNTER — Telehealth (HOSPITAL_COMMUNITY): Payer: Self-pay

## 2019-12-16 NOTE — Telephone Encounter (Signed)
Encounter complete. 

## 2019-12-18 ENCOUNTER — Other Ambulatory Visit (HOSPITAL_COMMUNITY)
Admission: RE | Admit: 2019-12-18 | Discharge: 2019-12-18 | Disposition: A | Discharge status: Home / Self Care | Payer: Medicare HMO | Source: Ambulatory Visit | Attending: Cardiology | Admitting: Cardiology

## 2019-12-18 DIAGNOSIS — Z01812 Encounter for preprocedural laboratory examination: Secondary | ICD-10-CM | POA: Diagnosis not present

## 2019-12-18 DIAGNOSIS — Z20822 Contact with and (suspected) exposure to covid-19: Secondary | ICD-10-CM | POA: Insufficient documentation

## 2019-12-18 LAB — SARS CORONAVIRUS 2 (TAT 6-24 HRS): SARS Coronavirus 2: NEGATIVE

## 2019-12-22 ENCOUNTER — Other Ambulatory Visit: Payer: Self-pay

## 2019-12-22 ENCOUNTER — Ambulatory Visit (HOSPITAL_COMMUNITY)
Admission: RE | Admit: 2019-12-22 | Discharge: 2019-12-22 | Disposition: A | Discharge status: Home / Self Care | Payer: Medicare HMO | Source: Ambulatory Visit | Attending: Cardiology | Admitting: Cardiology

## 2019-12-22 DIAGNOSIS — E8881 Metabolic syndrome: Secondary | ICD-10-CM | POA: Diagnosis not present

## 2019-12-22 DIAGNOSIS — R079 Chest pain, unspecified: Secondary | ICD-10-CM | POA: Insufficient documentation

## 2019-12-22 LAB — EXERCISE TOLERANCE TEST
Estimated workload: 4.6 METS
Exercise duration (min): 3 min
Exercise duration (sec): 0 s
MPHR: 159 {beats}/min
Peak HR: 144 {beats}/min
Percent HR: 90 %
Rest HR: 110 {beats}/min

## 2019-12-23 ENCOUNTER — Ambulatory Visit (INDEPENDENT_AMBULATORY_CARE_PROVIDER_SITE_OTHER): Payer: Medicare HMO

## 2019-12-23 DIAGNOSIS — J4540 Moderate persistent asthma, uncomplicated: Secondary | ICD-10-CM

## 2020-01-06 ENCOUNTER — Ambulatory Visit: Payer: Self-pay

## 2020-01-07 ENCOUNTER — Encounter: Payer: Self-pay | Admitting: Cardiology

## 2020-01-07 ENCOUNTER — Ambulatory Visit: Payer: Medicare HMO | Admitting: Cardiology

## 2020-01-07 ENCOUNTER — Other Ambulatory Visit: Payer: Self-pay

## 2020-01-07 DIAGNOSIS — E782 Mixed hyperlipidemia: Secondary | ICD-10-CM | POA: Diagnosis not present

## 2020-01-07 DIAGNOSIS — I351 Nonrheumatic aortic (valve) insufficiency: Secondary | ICD-10-CM

## 2020-01-07 DIAGNOSIS — I10 Essential (primary) hypertension: Secondary | ICD-10-CM

## 2020-01-07 DIAGNOSIS — R079 Chest pain, unspecified: Secondary | ICD-10-CM

## 2020-01-07 NOTE — Patient Instructions (Signed)
Medication Instructions:  No changes *If you need a refill on your cardiac medications before your next appointment, please call your pharmacy*   Lab Work: Not needed    Testing/Procedures: Not needed   Follow-Up: At [facility], you and your health needs are our priority.  As part of our continuing mission to provide you with exceptional heart care, we have created designated Provider Care Teams.  These Care Teams include your primary Cardiologist (physician) and Advanced Practice Providers (APPs -  Physician Assistants and Nurse Practitioners) who all work together to provide you with the care you need, when you need it.  We recommend signing up for the patient portal called "MyChart".  Sign up information is provided on this After Visit Summary.  MyChart is used to connect with patients for Virtual Visits (Telemedicine).  Patients are able to view lab/test results, encounter notes, upcoming appointments, etc.  Non-urgent messages can be sent to your provider as well.   To learn more about what you can do with MyChart, go to [email or web address].    Your next appointment:    as needed  The format for your next appointment:   In Person  Provider:   You may see  Dr Ellyn Hack or one of the following Advanced Practice Providers on your designated Care Team:    Rosaria Ferries, PA-C  Jory Sims, DNP, ANP  Cadence Kathlen Mody, NP

## 2020-01-07 NOTE — Progress Notes (Signed)
Primary Care Provider: [name], NP Cardiologist: No primary care provider on file. Electrophysiologist: None  Clinic Note: Chief Complaint  Patient presents with  . Follow-up    2 months; test results    HPI:    Tammie [name] is a 59 y.o. female with history of relatively SEVERE ASTHMA, distant history of SYNCOPE, HTN, borderline DM-2, obesity and prior history of "CARDIOPULMONARY ARREST in 2015 "who is being seen today for follow-up evaluation of CHEST PAIN, PALPITATIONS and DYSPNEA.    Tammie [name] was seen for initial consultation on 11/29/2019 at the request of Sherrie Mustache, NP.  The referral was for palpitations and chest tightness (2 episodes lasting 3 hours each.)  She does have a notable family history for cardiac disease.   Recent Hospitalizations: None  Reviewed  CV studies:    The following studies were reviewed today: (if available, images/films reviewed: From Epic Chart or Care Everywhere) . 12/22/2019 GXT: "Uninterpretable due to baseline ST and T wave abnormalities (LVH w/ repolarization changes); poor exercise capacity-30 min.  4.6 METS.  Stop because of dyspnea.    Interval History:   Tammie [name] presents here today to discuss her stress test.  Basically see could not walk any further because of joint pain and dyspnea on the treadmill.  Despite that she did not have any chest pain or pressure indicating that at max effort, we could not reproduce her resting episodes of chest pain/pressure lasting couple hours.  This would indicate that she is not truly having angina.  She is no longer having any episodes of chest pain.  She is quite deconditioned and is not really doing any exercise activity.  As noted, she always has exertional dyspnea, and relatively poorly controlled asthma.   Cardiovascular Review of Symptoms (Summary): positive for - dyspnea on exertion, palpitations, shortness of breath and Chronic shortness of breath negative for - chest  pain, edema, irregular heartbeat, orthopnea, palpitations, paroxysmal nocturnal dyspnea, rapid heart rate or Syncope/near syncope, TIA/amaurosis fugax, claudication  The patient does not have symptoms concerning for COVID-19 infection (fever, chills, cough, or new shortness of breath).  The patient is practicing social distancing & Masking.    REVIEWED OF SYSTEMS   Review of Systems  Constitutional: Positive for malaise/fatigue (Chronic). Negative for weight loss (Has put on quite a bit of weight since the COVID-19 lockdown).  HENT: Negative for nosebleeds.   Respiratory: Positive for cough, shortness of breath and wheezing. Negative for sputum production.   Cardiovascular: Negative for chest pain.  Gastrointestinal: Negative for blood in stool and melena.  Genitourinary: Negative for hematuria.  Musculoskeletal: Positive for joint pain.  Neurological: Positive for dizziness (Off and on, positional) and headaches.  Psychiatric/Behavioral: The patient has insomnia (Did not sleep well last night.).    I have reviewed and (if needed) personally updated the patient's problem list, medications, allergies, past medical and surgical history, social and family history.   PAST MEDICAL HISTORY   Past Medical History:  Diagnosis Date  . 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 Anemia   . Anxiety   . Eczema   . Environmental allergies   . GERD (gastroesophageal reflux disease)    Patient denies  . Headache(784.0)   . Hypertension   . Moderate intermittent asthma   . Respiratory failure, acute ([place]) 04/19/2014   Related to potential Anaphylaxis ? presumably to ASA - exacerbated by Moderate to Severe Asthma   September 2015: Acute respiratory failure with respiratory acidosis  thought to be related to anaphylactic reaction potentially to aspirin (initial presentation with cough congestion chest pain treated with aspirin and NTG)-> suffered cardiopulmonary arrest leading to intubation.Marland Kitchen  PAST  SURGICAL HISTORY   Past Surgical History:  Procedure Laterality Date  . [place] SINUS SURGERY     Polyps  . TRANSTHORACIC ECHOCARDIOGRAM  04/2014   in setting of cardiopulmonary arrest): EF 65 to 70%.  Mild concentric LVH.  GR 1 DD.  Mild AI.;;  August 2019: EF 55 to 60%.  Mild LVH.  GR 1 DD.  Mild AI/MR.  Mildly increased PA pressures.   . 03/17/2018-Echo: EF 55 to 60%.  Mild LVH.  GR 1 DD.  Mild AI and MR.  Mildly increased PA pressures. . 03/12/2016-Echo: EF 55 to 60%.  Mild LVH.  GR 1 DD.  Mild AI and MR.  Mildly increased PA pressures. . 04/16/2014-Echo (in setting of cardiopulmonary arrest): EF 65 to 70%.  Mild concentric LVH.  GR 1 DD.  Mild AI.  MEDICATIONS/ALLERGIES   Current Meds  Medication Sig  . acetaminophen (TYLENOL) 160 MG/5ML suspension Take by mouth every 6 (six) hours as needed.  Marland Kitchen albuterol (VENTOLIN HFA) 108 (90 Base) MCG/ACT inhaler INHALE 2 PUFFS INTO THE LUNGS EVERY 6 HOURS AS NEEDED FOR WHEEZING OR SHORTNESS OF BREATH  . amLODipine (NORVASC) 10 MG tablet TAKE 1 TABLET BY MOUTH EVERY DAY FOR HIGH BLOOD PRESSURE  . bisoprolol (ZEBETA) 5 MG tablet TAKE 1/2 TABLET BY MOUTH EVERY DAY FOR HIGH BLOOD PRESSURE  . cetirizine (ZYRTEC) 10 MG tablet TAKE 1 TABLET(10 MG) BY MOUTH DAILY  . EPINEPHrine 0.3 mg/0.3 mL IJ SOAJ injection   . fluticasone (FLONASE) 50 MCG/ACT nasal spray SHAKE LIQUID AND USE 2 SPRAYS IN EACH NOSTRIL DAILY  . Fluticasone-Salmeterol (WIXELA INHUB) 250-50 MCG/DOSE AEPB Inhale 2 puffs into the lungs 2 (two) times daily.  . hydrALAZINE (APRESOLINE) 25 MG tablet TAKE 1 TABLET BY MOUTH EVERY 8 HOURS  . Omega-3 Fatty Acids (FISH OIL) 500 MG CAPS Take 1 capsule by mouth daily.  Marland Kitchen triamcinolone ointment (KENALOG) 0.5 % Apply 1 application topically 2 (two) times daily.   Current Facility-Administered Medications for the 01/07/20 encounter (Office Visit) with Leonie Man, MD  Medication  . dupilumab  (DUPIXENT) prefilled syringe 300 mg    Allergies  Allergen Reactions  . Ace Inhibitors Other (See Comments)    Angioedema  . Aspirin Anaphylaxis, Swelling and Other (See Comments)    Throat swelling   . Doxepin Anaphylaxis  . Ibuprofen Anaphylaxis, Swelling and Other (See Comments)    Throat swelling  . Nsaids Anaphylaxis, Swelling and Other (See Comments)  . Lipitor [Atorvastatin] Swelling and Other (See Comments)    Face, feet swell    SOCIAL HISTORY/FAMILY HISTORY   Social History   Tobacco Use  . Smoking status: Never Smoker  . Smokeless tobacco: Never Used  Vaping Use  . Vaping Use: Never used  Substance Use Topics  . Alcohol use: No  . Drug use: No   Social History   Social History Narrative   Diet-Salt Free   Drinks Soda-Caffeine    Lives in an apartment, one stories, one person in home, no pets   Current/past profession: Dietary, dish-room, and cooks    Exercises 2 x weekly   Family History  Problem Relation Age of Onset  . Heart attack Father   . Heart attack Mother   . Diabetes  Sister   . High blood pressure Brother   . Hypertension Brother   . Asthma Brother   . High blood pressure Sister   . Cancer Sister   . High blood pressure Son   . Cancer Maternal Grandmother   . Heart disease Paternal Grandmother   . Pancreatic cancer Paternal Grandmother   . Asthma Other        Great paternal grandfather   . Autism Son   . Asthma Other        Neice   . Colon cancer Neg Hx   . Esophageal cancer Neg Hx   . Rectal cancer Neg Hx   . Stomach cancer Neg Hx   . Allergic rhinitis Neg Hx   . Angioedema Neg Hx   . Eczema Neg Hx   . Immunodeficiency Neg Hx   . Atopy Neg Hx     OBJCTIVE -PE, EKG, labs   Wt Readings from Last 3 Encounters:  01/07/20 157 lb (71.2 kg)  11/29/19 158 lb (71.7 kg)  11/10/19 160 lb 9.6 oz (72.8 kg)    Physical Exam: BP 140/90 (BP Location: Right Arm, Patient Position: Sitting, Cuff Size: Normal)   Pulse 67   Temp (!)  97.3 F (36.3 C)   Ht 4\' 9"  (1.448 m)   Wt 157 lb (71.2 kg)   SpO2 97%   BMI 33.97 kg/m  Physical Exam Vitals reviewed.  Constitutional:      General: She is not in acute distress.    Appearance: She is well-developed and well-nourished.     Comments: Healthy-appearing.  Well-groomed  HENT:     Head: Normocephalic and atraumatic.  Cardiovascular:     Rate and Rhythm: Normal rate and regular rhythm.     Pulses: Intact distal pulses.     Heart sounds: Murmur (Soft 1/6 SEM at RUSB) heard.  No friction rub. No gallop.   Pulmonary:     Effort: Pulmonary effort is normal. No respiratory distress.     Comments: Mild diffuse expiratory wheezes.  No rales or rhonchi. Chest:     Chest wall: Tenderness (Less prominent palpable discomfort) present.  Musculoskeletal:        General: No edema. Normal range of motion.  Neurological:     Mental Status: She is alert and oriented to person, place, and time.  Psychiatric:        Mood and Affect: Mood and affect normal.        Behavior: Behavior normal.        Thought Content: Thought content normal.        Judgment: Judgment normal.     Adult ECG Report N/A  Recent Labs:  n/a Lab Results  Component Value Date   CHOL 168 07/12/2019   HDL 40 (L) 07/12/2019   LDLCALC 97 07/12/2019   TRIG 216 (H) 07/12/2019   CHOLHDL 4.2 07/12/2019   Lab Results  Component Value Date   CREATININE 1.11 (H) 11/10/2019   BUN 10 11/10/2019   NA 143 11/10/2019   K 4.0 11/10/2019   CL 105 11/10/2019   CO2 31 11/10/2019   Lab Results  Component Value Date   TSH 1.96 11/10/2019    ASSESSMENT/PLAN    Problem List Items Addressed This Visit    Hypertension (Chronic)    Blood pressure is high today, but has been well controlled on amlodipine and bisoprolol on hydralazine.  She tells me she did not sleep very well last night because of abdominal pains.  Continue to follow pressures at home and follow-up PCP.      Aortic valve regurgitation  (Chronic)    She has minimal systolic ejection murmur, but no sounds of AI.  Unless symptoms or murmur worsen, will probably not recheck echo.      Mixed hyperlipidemia (Chronic)    Monitored by PCP.  Not currently on statin.  Continue fish oil.  With target LDL less than 100      Chest pain at rest (Chronic)    No further chest pain.  We talked about evaluation going forward.  Where she did have more chest discomfort, we would potentially consider coronary CTA, but since she is no longer having chest pain and did not have chest pain with maximal exertion, it is not very likely that this chest pain was related to coronary disease.  Follow-up as needed.         COVID-19 Education: The signs and symptoms of COVID-19 were discussed with the patient and how to seek care for testing (follow up with PCP or arrange E-visit).   The importance of social distancing and COVID-19 vaccination was discussed today.  I spent a total of 25minutes with the patient. >  50% of the time was spent in direct patient consultation.  Additional time spent with chart review  / charting (studies, outside notes, etc): 6 Total Time: 22min   Current medicines are reviewed at length with the patient today.  (+/- concerns) none  Notice: This dictation was prepared with Dragon dictation along with smaller phrase technology. Any transcriptional errors that result from this process are unintentional and may not be corrected upon review.  Patient Instructions / Medication Changes & Studies & Tests Ordered   Patient Instructions  Medication Instructions:  No changes *If you need a refill on your cardiac medications before your next appointment, please call your pharmacy*   Lab Work: Not needed    Testing/Procedures: Not needed   Follow-Up: At [facility], you and your health needs are our priority.  As part of our continuing mission to provide you with exceptional heart care, we have created  designated Provider Care Teams.  These Care Teams include your primary Cardiologist (physician) and Advanced Practice Providers (APPs -  Physician Assistants and Nurse Practitioners) who all work together to provide you with the care you need, when you need it.  We recommend signing up for the patient portal called "MyChart".  Sign up information is provided on this After Visit Summary.  MyChart is used to connect with patients for Virtual Visits (Telemedicine).  Patients are able to view lab/test results, encounter notes, upcoming appointments, etc.  Non-urgent messages can be sent to your provider as well.   To learn more about what you can do with MyChart, go to [email or web address].    Your next appointment:    as needed  The format for your next appointment:   In Person  Provider:   You may see  Dr Ellyn Hack or one of the following Advanced Practice Providers on your designated Care Team:    Rosaria Ferries, PA-C  Jory Sims, DNP, ANP  Cadence Kathlen Mody, NP       Studies Ordered:   No orders of the defined types were placed in this encounter.    Glenetta Hew, M.D., M.S. Interventional Cardiologist   Pager # [phone] Phone # [phone] [address] [place], [place] [address]   Thank you for choosing [facility] at [facility]!!

## 2020-01-13 ENCOUNTER — Encounter: Payer: Self-pay | Admitting: Cardiology

## 2020-01-13 NOTE — Assessment & Plan Note (Signed)
Monitored by PCP.  Not currently on statin.  Continue fish oil.  With target LDL less than 100

## 2020-01-13 NOTE — Assessment & Plan Note (Addendum)
Blood pressure is high today, but has been well controlled on amlodipine and bisoprolol on hydralazine.  She tells me she did not sleep very well last night because of abdominal pains.  Continue to follow pressures at home and follow-up PCP.

## 2020-01-13 NOTE — Assessment & Plan Note (Signed)
She has minimal systolic ejection murmur, but no sounds of AI.  Unless symptoms or murmur worsen, will probably not recheck echo.

## 2020-01-13 NOTE — Assessment & Plan Note (Signed)
No further chest pain.  We talked about evaluation going forward.  Where she did have more chest discomfort, we would potentially consider coronary CTA, but since she is no longer having chest pain and did not have chest pain with maximal exertion, it is not very likely that this chest pain was related to coronary disease.  Follow-up as needed.

## 2020-01-19 ENCOUNTER — Ambulatory Visit (INDEPENDENT_AMBULATORY_CARE_PROVIDER_SITE_OTHER): Payer: Medicare HMO | Admitting: *Deleted

## 2020-01-19 ENCOUNTER — Ambulatory Visit: Payer: Self-pay | Admitting: *Deleted

## 2020-01-19 DIAGNOSIS — J4550 Severe persistent asthma, uncomplicated: Secondary | ICD-10-CM

## 2020-02-02 ENCOUNTER — Other Ambulatory Visit: Payer: Self-pay

## 2020-02-02 ENCOUNTER — Ambulatory Visit (INDEPENDENT_AMBULATORY_CARE_PROVIDER_SITE_OTHER): Payer: Medicare HMO

## 2020-02-02 DIAGNOSIS — J4550 Severe persistent asthma, uncomplicated: Secondary | ICD-10-CM

## 2020-02-09 ENCOUNTER — Ambulatory Visit (INDEPENDENT_AMBULATORY_CARE_PROVIDER_SITE_OTHER): Payer: Medicare HMO | Admitting: Nurse Practitioner

## 2020-02-09 ENCOUNTER — Encounter: Payer: Self-pay | Admitting: Nurse Practitioner

## 2020-02-09 ENCOUNTER — Other Ambulatory Visit: Payer: Self-pay

## 2020-02-09 VITALS — BP 124/80 | HR 76 | Temp 97.1°F | Ht <= 58 in | Wt 157.2 lb

## 2020-02-09 DIAGNOSIS — K089 Disorder of teeth and supporting structures, unspecified: Secondary | ICD-10-CM

## 2020-02-09 DIAGNOSIS — I10 Essential (primary) hypertension: Secondary | ICD-10-CM

## 2020-02-09 DIAGNOSIS — J4540 Moderate persistent asthma, uncomplicated: Secondary | ICD-10-CM

## 2020-02-09 DIAGNOSIS — E782 Mixed hyperlipidemia: Secondary | ICD-10-CM

## 2020-02-09 DIAGNOSIS — E119 Type 2 diabetes mellitus without complications: Secondary | ICD-10-CM | POA: Diagnosis not present

## 2020-02-09 NOTE — Patient Instructions (Addendum)
Make sure to increase water intake Decrease sodas  Diabetes Mellitus and Nutrition, Adult When you have diabetes (diabetes mellitus), it is very important to have healthy eating habits because your blood sugar (glucose) levels are greatly affected by what you eat and drink. Eating healthy foods in the appropriate amounts, at about the same times every day, can help you:  Control your blood glucose.  Lower your risk of heart disease.  Improve your blood pressure.  Reach or maintain a healthy weight. Every person with diabetes is different, and each person has different needs for a meal plan. Your health care provider may recommend that you work with a diet and nutrition specialist (dietitian) to make a meal plan that is best for you. Your meal plan may vary depending on factors such as:  The calories you need.  The medicines you take.  Your weight.  Your blood glucose, blood pressure, and cholesterol levels.  Your activity level.  Other health conditions you have, such as heart or kidney disease. How do carbohydrates affect me? Carbohydrates, also called carbs, affect your blood glucose level more than any other type of food. Eating carbs naturally raises the amount of glucose in your blood. Carb counting is a method for keeping track of how many carbs you eat. Counting carbs is important to keep your blood glucose at a healthy level, especially if you use insulin or take certain oral diabetes medicines. It is important to know how many carbs you can safely have in each meal. This is different for every person. Your dietitian can help you calculate how many carbs you should have at each meal and for each snack. Foods that contain carbs include:  Bread, cereal, rice, pasta, and crackers.  Potatoes and corn.  Peas, beans, and lentils.  Milk and yogurt.  Fruit and juice.  Desserts, such as cakes, cookies, ice cream, and candy. How does alcohol affect me? Alcohol can cause a  sudden decrease in blood glucose (hypoglycemia), especially if you use insulin or take certain oral diabetes medicines. Hypoglycemia can be a life-threatening condition. Symptoms of hypoglycemia (sleepiness, dizziness, and confusion) are similar to symptoms of having too much alcohol. If your health care provider says that alcohol is safe for you, follow these guidelines:  Limit alcohol intake to no more than 1 drink per day for nonpregnant women and 2 drinks per day for men. One drink equals 12 oz of beer, 5 oz of wine, or 1 oz of hard liquor.  Do not drink on an empty stomach.  Keep yourself hydrated with water, diet soda, or unsweetened iced tea.  Keep in mind that regular soda, juice, and other mixers may contain a lot of sugar and must be counted as carbs. What are tips for following this plan?  Reading food labels  Start by checking the serving size on the "Nutrition Facts" label of packaged foods and drinks. The amount of calories, carbs, fats, and other nutrients listed on the label is based on one serving of the item. Many items contain more than one serving per package.  Check the total grams (g) of carbs in one serving. You can calculate the number of servings of carbs in one serving by dividing the total carbs by 15. For example, if a food has 30 g of total carbs, it would be equal to 2 servings of carbs.  Check the number of grams (g) of saturated and trans fats in one serving. Choose foods that have low or no  amount of these fats.  Check the number of milligrams (mg) of salt (sodium) in one serving. Most people should limit total sodium intake to less than 2,300 mg per day.  Always check the nutrition information of foods labeled as "low-fat" or "nonfat". These foods may be higher in added sugar or refined carbs and should be avoided.  Talk to your dietitian to identify your daily goals for nutrients listed on the label. Shopping  Avoid buying canned, premade, or processed  foods. These foods tend to be high in fat, sodium, and added sugar.  Shop around the outside edge of the grocery store. This includes fresh fruits and vegetables, bulk grains, fresh meats, and fresh dairy. Cooking  Use low-heat cooking methods, such as baking, instead of high-heat cooking methods like deep frying.  Cook using healthy oils, such as olive, canola, or sunflower oil.  Avoid cooking with butter, cream, or high-fat meats. Meal planning  Eat meals and snacks regularly, preferably at the same times every day. Avoid going long periods of time without eating.  Eat foods high in fiber, such as fresh fruits, vegetables, beans, and whole grains. Talk to your dietitian about how many servings of carbs you can eat at each meal.  Eat 4-6 ounces (oz) of lean protein each day, such as lean meat, chicken, fish, eggs, or tofu. One oz of lean protein is equal to: ? 1 oz of meat, chicken, or fish. ? 1 egg. ?  cup of tofu.  Eat some foods each day that contain healthy fats, such as avocado, nuts, seeds, and fish. Lifestyle  Check your blood glucose regularly.  Exercise regularly as told by your health care provider. This may include: ? 150 minutes of moderate-intensity or vigorous-intensity exercise each week. This could be brisk walking, biking, or water aerobics. ? Stretching and doing strength exercises, such as yoga or weightlifting, at least 2 times a week.  Take medicines as told by your health care provider.  Do not use any products that contain nicotine or tobacco, such as cigarettes and e-cigarettes. If you need help quitting, ask your health care provider.  Work with a Social worker or diabetes educator to identify strategies to manage stress and any emotional and social challenges. Questions to ask a health care provider  Do I need to meet with a diabetes educator?  Do I need to meet with a dietitian?  What number can I call if I have questions?  When are the best times  to check my blood glucose? Where to find more information:  American Diabetes Association: diabetes.org  Academy of Nutrition and Dietetics: www.eatright.[employer] of Diabetes and Digestive and Kidney Diseases (NIH): [employer] Summary  A healthy meal plan will help you control your blood glucose and maintain a healthy lifestyle.  Working with a diet and nutrition specialist (dietitian) can help you make a meal plan that is best for you.  Keep in mind that carbohydrates (carbs) and alcohol have immediate effects on your blood glucose levels. It is important to count carbs and to use alcohol carefully. This information is not intended to replace advice given to you by your health care provider. Make sure you discuss any questions you have with your health care provider. Document Revised: 07/04/2017 Document Reviewed: 08/26/2016 Elsevier Patient Education  [place]. Diabetes Mellitus and [place] care is an important part of your health, especially when you have diabetes. Diabetes may cause you to have problems because of poor blood  flow (circulation) to your feet and legs, which can cause your skin to:  Become thinner and drier.  Break more easily.  Heal more slowly.  Peel and crack. You may also have nerve damage (neuropathy) in your legs and feet, causing decreased feeling in them. This means that you may not notice minor injuries to your feet that could lead to more serious problems. Noticing and addressing any potential problems early is the best way to prevent future foot problems. How to care for your feet Foot hygiene  Wash your feet daily with warm water and mild soap. Do not use hot water. Then, pat your feet and the areas between your toes until they are completely dry. Do not soak your feet as this can dry your skin.  Trim your toenails straight across. Do not dig under them or around the cuticle. File the edges of your nails with an  emery board or nail file.  Apply a moisturizing lotion or petroleum jelly to the skin on your feet and to dry, brittle toenails. Use lotion that does not contain alcohol and is unscented. Do not apply lotion between your toes. Shoes and socks  Wear clean socks or stockings every day. Make sure they are not too tight. Do not wear knee-high stockings since they may decrease blood flow to your legs.  Wear shoes that fit properly and have enough cushioning. Always look in your shoes before you put them on to be sure there are no objects inside.  To break in new shoes, wear them for just a few hours a day. This prevents injuries on your feet. Wounds, scrapes, corns, and calluses  Check your feet daily for blisters, cuts, bruises, sores, and redness. If you cannot see the bottom of your feet, use a mirror or ask someone for help.  Do not cut corns or calluses or try to remove them with medicine.  If you find a minor scrape, cut, or break in the skin on your feet, keep it and the skin around it clean and dry. You may clean these areas with mild soap and water. Do not clean the area with peroxide, alcohol, or iodine.  If you have a wound, scrape, corn, or callus on your foot, look at it several times a day to make sure it is healing and not infected. Check for: ? Redness, swelling, or pain. ? Fluid or blood. ? Warmth. ? Pus or a bad smell. General instructions  Do not cross your legs. This may decrease blood flow to your feet.  Do not use heating pads or hot water bottles on your feet. They may burn your skin. If you have lost feeling in your feet or legs, you may not know this is happening until it is too late.  Protect your feet from hot and cold by wearing shoes, such as at the beach or on hot pavement.  Schedule a complete foot exam at least once a year (annually) or more often if you have foot problems. If you have foot problems, report any cuts, sores, or bruises to your health care  provider immediately. Contact a health care provider if:  You have a medical condition that increases your risk of infection and you have any cuts, sores, or bruises on your feet.  You have an injury that is not healing.  You have redness on your legs or feet.  You feel burning or tingling in your legs or feet.  You have pain or cramps in your  legs and feet.  Your legs or feet are numb.  Your feet always feel cold.  You have pain around a toenail. Get help right away if:  You have a wound, scrape, corn, or callus on your foot and: ? You have pain, swelling, or redness that gets worse. ? You have fluid or blood coming from the wound, scrape, corn, or callus. ? Your wound, scrape, corn, or callus feels warm to the touch. ? You have pus or a bad smell coming from the wound, scrape, corn, or callus. ? You have a fever. ? You have a red line going up your leg. Summary  Check your feet every day for cuts, sores, red spots, swelling, and blisters.  Moisturize feet and legs daily.  Wear shoes that fit properly and have enough cushioning.  If you have foot problems, report any cuts, sores, or bruises to your health care provider immediately.  Schedule a complete foot exam at least once a year (annually) or more often if you have foot problems. This information is not intended to replace advice given to you by your health care provider. Make sure you discuss any questions you have with your health care provider. Document Revised: 04/14/2019 Document Reviewed: 08/23/2016 Elsevier Patient Education  [place]. Diabetes Basics  Diabetes (diabetes mellitus) is a long-term (chronic) disease. It occurs when the body does not properly use sugar (glucose) that is released from food after you eat. Diabetes may be caused by one or both of these problems:  Your pancreas does not make enough of a hormone called insulin.  Your body does not react in a normal way to insulin that it  makes. Insulin lets sugars (glucose) go into cells in your body. This gives you energy. If you have diabetes, sugars cannot get into cells. This causes high blood sugar (hyperglycemia). Follow these instructions at home: How is diabetes treated? You may need to take insulin or other diabetes medicines daily to keep your blood sugar in balance. Take your diabetes medicines every day as told by your doctor. List your diabetes medicines here: Diabetes medicines  Name of medicine: ______________________________ ? Amount (dose): _______________ Time (a.m./p.m.): _______________ Notes: ___________________________________  Name of medicine: ______________________________ ? Amount (dose): _______________ Time (a.m./p.m.): _______________ Notes: ___________________________________  Name of medicine: ______________________________ ? Amount (dose): _______________ Time (a.m./p.m.): _______________ Notes: ___________________________________ If you use insulin, you will learn how to give yourself insulin by injection. You may need to adjust the amount based on the food that you eat. List the types of insulin you use here: Insulin  Insulin type: ______________________________ ? Amount (dose): _______________ Time (a.m./p.m.): _______________ Notes: ___________________________________  Insulin type: ______________________________ ? Amount (dose): _______________ Time (a.m./p.m.): _______________ Notes: ___________________________________  Insulin type: ______________________________ ? Amount (dose): _______________ Time (a.m./p.m.): _______________ Notes: ___________________________________  Insulin type: ______________________________ ? Amount (dose): _______________ Time (a.m./p.m.): _______________ Notes: ___________________________________  Insulin type: ______________________________ ? Amount (dose): _______________ Time (a.m./p.m.): _______________ Notes:  ___________________________________ How do I manage my blood sugar?  Check your blood sugar levels using a blood glucose monitor as directed by your doctor. Your doctor will set treatment goals for you. Generally, you should have these blood sugar levels:  Before meals (preprandial): 80-130 mg/dL (4.4-7.2 mmol/L).  After meals (postprandial): below 180 mg/dL (10 mmol/L).  A1c level: less than 7%. Write down the times that you will check your blood sugar levels: Blood sugar checks  Time: _______________ Notes: ___________________________________  Time: _______________ Notes: ___________________________________  Time: _______________ Notes: ___________________________________  Time: _______________ Notes: ___________________________________  Time: _______________ Notes: ___________________________________  Time: _______________ Notes: ___________________________________  What do I need to know about low blood sugar? Low blood sugar is called hypoglycemia. This is when blood sugar is at or below 70 mg/dL (3.9 mmol/L). Symptoms may include:  Feeling: ? Hungry. ? Worried or nervous (anxious). ? Sweaty and clammy. ? Confused. ? Dizzy. ? Sleepy. ? Sick to your stomach (nauseous).  Having: ? A fast heartbeat. ? A headache. ? A change in your vision. ? Tingling or no feeling (numbness) around the mouth, lips, or tongue. ? Jerky movements that you cannot control (seizure).  Having trouble with: ? Moving (coordination). ? Sleeping. ? Passing out (fainting). ? Getting upset easily (irritability). Treating low blood sugar To treat low blood sugar, eat or drink something sugary right away. If you can think clearly and swallow safely, follow the 15:15 rule:  Take 15 grams of a fast-acting carb (carbohydrate). Talk with your doctor about how much you should take.  Some fast-acting carbs are: ? Sugar tablets (glucose pills). Take 3-4 glucose pills. ? 6-8 pieces of hard  candy. ? 4-6 oz (120-150 mL) of fruit juice. ? 4-6 oz (120-150 mL) of regular (not diet) soda. ? 1 Tbsp (15 mL) honey or sugar.  Check your blood sugar 15 minutes after you take the carb.  If your blood sugar is still at or below 70 mg/dL (3.9 mmol/L), take 15 grams of a carb again.  If your blood sugar does not go above 70 mg/dL (3.9 mmol/L) after 3 tries, get help right away.  After your blood sugar goes back to normal, eat a meal or a snack within 1 hour. Treating very low blood sugar If your blood sugar is at or below 54 mg/dL (3 mmol/L), you have very low blood sugar (severe hypoglycemia). This is an emergency. Do not wait to see if the symptoms will go away. Get medical help right away. Call your local emergency services (911 in the [place] Do not drive yourself to the hospital. Questions to ask your health care provider  Do I need to meet with a diabetes educator?  What equipment will I need to care for myself at home?  What diabetes medicines do I need? When should I take them?  How often do I need to check my blood sugar?  What number can I call if I have questions?  When is my next doctor's visit?  Where can I find a support group for people with diabetes? Where to find more information  American Diabetes Association: www.diabetes.org  American Association of Diabetes Educators: www.diabeteseducator.org/patient-resources Contact a doctor if:  Your blood sugar is at or above 240 mg/dL (13.3 mmol/L) for 2 days in a row.  You have been sick or have had a fever for 2 days or more, and you are not getting better.  You have any of these problems for more than 6 hours: ? You cannot eat or drink. ? You feel sick to your stomach (nauseous). ? You throw up (vomit). ? You have watery poop (diarrhea). Get help right away if:  Your blood sugar is lower than 54 mg/dL (3 mmol/L).  You get confused.  You have trouble: ? Thinking clearly. ? Breathing. Summary  Diabetes  (diabetes mellitus) is a long-term (chronic) disease. It occurs when the body does not properly use sugar (glucose) that is released from food after digestion.  Take insulin and diabetes medicines as told.  Check your blood sugar  every day, as often as told.  Keep all follow-up visits as told by your doctor. This is important. This information is not intended to replace advice given to you by your health care provider. Make sure you discuss any questions you have with your health care provider. Document Revised: 04/14/2019 Document Reviewed: 10/24/2017 Elsevier Patient Education  [place].

## 2020-02-09 NOTE — Progress Notes (Signed)
Careteam: Patient Care Team: [name], NP as PCP - General (Geriatric Medicine) [name], MD as Consulting Physician (Ophthalmology)  PLACE OF SERVICE:  [facility]  Advanced Directive information    Allergies  Allergen Reactions  . Ace Inhibitors Other (See Comments)    Angioedema  . Aspirin Anaphylaxis, Swelling and Other (See Comments)    Throat swelling   . Doxepin Anaphylaxis  . Ibuprofen Anaphylaxis, Swelling and Other (See Comments)    Throat swelling  . Nsaids Anaphylaxis, Swelling and Other (See Comments)  . Lipitor [Atorvastatin] Swelling and Other (See Comments)    Face, feet swell    Chief Complaint  Patient presents with  . Medical Management of Chronic Issues    Patient is seen for 32-monthfollowup. Has no complaints.     HPI: Patient is a 59y.o. female for routine follow up.   Asthma/allergies- continues to follow up with allergist, taking zyrtec daily, flonase, and dupizent every 14 days She seems confused about her inhalers.  Using fluticasone-salmeterol twice daily, has only needed albuterol once.   Diabetes- did not realize her blood work came back diabetes- she has not changed her diet much but reports overall trying to decrease sodas, drinking 2 16 oz sodas a day. Denies blurred vision, no numbness or tingling in feet.  htn- controlled on bisoprolol, amlodipine, and hydralazine.   Hyperlipidemia- facial swelling with lipitor noted so she is not on a statin. Continues on fish oil.    Review of Systems:  Review of Systems  Constitutional: Negative for chills, fever and weight loss.  HENT: Negative for hearing loss and tinnitus.   Respiratory: Negative for cough, sputum production and shortness of breath.   Cardiovascular: Negative for chest pain, palpitations and leg swelling.  Gastrointestinal: Negative for abdominal pain, constipation, diarrhea and heartburn.  Genitourinary: Negative for dysuria, frequency and urgency.   Musculoskeletal: Negative for back pain, falls, joint pain and myalgias.  Skin: Negative.   Neurological: Negative for dizziness and headaches.  Psychiatric/Behavioral: Negative for depression and memory loss. The patient does not have insomnia.     Past Medical History:  Diagnosis Date  . 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 Anemia   . Anxiety   . Eczema   . Environmental allergies   . GERD (gastroesophageal reflux disease)    Patient denies  . Headache(784.0)   . Hypertension   . Moderate intermittent asthma   . Respiratory failure, acute ([place] 04/19/2014   Related to potential Anaphylaxis ? presumably to ASA - exacerbated by Moderate to Severe Asthma   Past Surgical History:  Pro[place] Laterality Date  . [place]     Polyps  . TRANSTHORACIC ECHOCARDIOGRAM  04/2014   in setting of cardiopulmonary arrest): EF 65 to 70%.  Mild concentric LVH.  GR 1 DD.  Mild AI.;;  August 2019: EF 55 to 60%.  Mild LVH.  GR 1 DD.  Mild AI/MR.  Mildly increased PA pressures.   Social History:   reports that she has never smoked. She has never used smokeless tobacco. She reports that she does not drink alcohol and does not use drugs.  Family History  Problem Relation Age of Onset  . Heart attack Father   . Heart attack Mother   . Diabetes Sister   . High blood pressure Brother   . Hypertension Brother   . Asthma Brother   .  High blood pressure Sister   . Cancer Sister   . High blood pressure Son   . Cancer Maternal Grandmother   . Heart disease Paternal Grandmother   . Pancreatic cancer Paternal Grandmother   . Asthma Other        Great paternal grandfather   . Autism Son   . Asthma Other        Neice   . Colon cancer Neg Hx   . Esophageal cancer Neg Hx   . Rectal cancer Neg Hx   . Stomach cancer Neg Hx   . Allergic rhinitis Neg Hx   . Angioedema Neg Hx   . Eczema Neg Hx   . Immunodeficiency Neg Hx   . Atopy Neg Hx      Medications: Patient's Medications  New Prescriptions   No medications on file  Previous Medications   ACETAMINOPHEN (TYLENOL) 160 MG/5ML SUSPENSION    Take by mouth every 6 (six) hours as needed.   ALBUTEROL (VENTOLIN HFA) 108 (90 BASE) MCG/ACT INHALER    INHALE 2 PUFFS INTO THE LUNGS EVERY 6 HOURS AS NEEDED FOR WHEEZING OR SHORTNESS OF BREATH   AMLODIPINE (NORVASC) 10 MG TABLET    TAKE 1 TABLET BY MOUTH EVERY DAY FOR HIGH BLOOD PRESSURE   BISOPROLOL (ZEBETA) 5 MG TABLET    TAKE 1/2 TABLET BY MOUTH EVERY DAY FOR HIGH BLOOD PRESSURE   CETIRIZINE (ZYRTEC) 10 MG TABLET    TAKE 1 TABLET(10 MG) BY MOUTH DAILY   EPINEPHRINE 0.3 MG/0.3 ML IJ SOAJ INJECTION       FLUTICASONE (FLONASE) 50 MCG/ACT NASAL SPRAY    SHAKE LIQUID AND USE 2 SPRAYS IN EACH NOSTRIL DAILY   FLUTICASONE-SALMETEROL (WIXELA INHUB) 250-50 MCG/DOSE AEPB    Inhale 2 puffs into the lungs 2 (two) times daily.   HYDRALAZINE (APRESOLINE) 25 MG TABLET    TAKE 1 TABLET BY MOUTH EVERY 8 HOURS   OMEGA-3 FATTY ACIDS (FISH OIL) 500 MG CAPS    Take 1 capsule by mouth daily.   TRIAMCINOLONE OINTMENT (KENALOG) 0.5 %    Apply 1 application topically 2 (two) times daily.  Modified Medications   No medications on file  Discontinued Medications   No medications on file    Physical Exam:  Vitals:   02/09/20 1052  BP: 124/80  Pulse: 76  Temp: (!) 97.1 F (36.2 C)  TempSrc: Temporal  SpO2: 96%  Weight: 157 lb 3.2 oz (71.3 kg)  Height: '4\' 9"'  (1.448 m)   Body mass index is 34.02 kg/m. Wt Readings from Last 3 Encounters:  02/09/20 157 lb 3.2 oz (71.3 kg)  01/07/20 157 lb (71.2 kg)  11/29/19 158 lb (71.7 kg)    Physical Exam Constitutional:      General: She is not in acute distress.    Appearance: She is well-developed. She is not diaphoretic.  HENT:     Head: Normocephalic and atraumatic.     Right Ear: External ear normal.     Left Ear: External ear normal.     Nose: Congestion present.     Mouth/Throat:     Dentition:  Abnormal dentition. Gingival swelling and dental caries present.     Pharynx: No oropharyngeal exudate.  Eyes:     Conjunctiva/sclera: Conjunctivae normal.     Pupils: Pupils are equal, round, and reactive to light.  Cardiovascular:     Rate and Rhythm: Normal rate and regular rhythm.     Heart sounds: Normal heart sounds.  Pulmonary:  Effort: Pulmonary effort is normal.     Breath sounds: Normal breath sounds.  Abdominal:     General: Bowel sounds are normal.     Palpations: Abdomen is soft.  Musculoskeletal:        General: No tenderness.     Cervical back: Normal range of motion and neck supple.  Skin:    General: Skin is warm and dry.  Neurological:     Mental Status: She is alert and oriented to person, place, and time.     Labs reviewed: Basic Metabolic Panel: Recent Labs    03/12/19 0941 07/12/19 1107 11/10/19 1019  NA 143 141 143  K 3.4* 3.3* 4.0  CL 101 103 105  CO2 '30 30 31  ' GLUCOSE 117* 111* 120*  BUN '8 10 10  ' CREATININE 0.98 0.98 1.11*  CALCIUM 9.6 9.9 9.8  TSH  --   --  1.96   Liver Function Tests: Recent Labs    03/12/19 0941 11/10/19 1019  AST 26 16  ALT 27 15  BILITOT 0.8 0.7  PROT 7.2 7.4   No results for input(s): LIPASE, AMYLASE in the last 8760 hours. No results for input(s): AMMONIA in the last 8760 hours. CBC: Recent Labs    03/12/19 0941 11/10/19 1019  WBC 6.4 6.8  NEUTROABS 3,354 3,278  HGB 14.1 13.5  HCT 43.1 40.5  MCV 80.7 81.8  PLT 326 315   Lipid Panel: Recent Labs    03/12/19 0941 07/12/19 1107  CHOL 147 168  HDL 36* 40*  LDLCALC 79 97  TRIG 230* 216*  CHOLHDL 4.1 4.2   TSH: Recent Labs    11/10/19 1019  TSH 1.96   A1C: Lab Results  Component Value Date   HGBA1C 6.7 (H) 11/10/2019     Assessment/Plan 1. Diabetes mellitus without complication ([place]) -encouraged dietary modifications, educations provided and material given. She declines nutritional consult. States her sister has diabetes and she  will help with her nutrition.  Encouraged dietary compliance, routine foot care/monitoring and to keep up with diabetic eye exams through ophthalmology  - Hemoglobin A1c - Microalbumin, urine  2. Mixed hyperlipidemia -diet modifications encouraged, continues on fish oil daily.  -statin intolerant  - Lipid Panel - CMP with eGFR([facility])  3. Essential hypertension -controlled on bisoprolol 5 mg 1/2 tablet and hydralazine TID.  - CBC with Differential/Platelet  4. Moderate persistent asthma without complication Stable at this time, reports control with  Fluticasone-salmeterol 2 puffs BID.states she is not needing albuterol often.   5. Poor dentition Encouraged to make dental appt, risk of poor dentition, periodontal disease discussed with pt.   Next appt: 3 months Lawrencia Mauney [address], [facility] [phone]

## 2020-02-10 LAB — CBC WITH DIFFERENTIAL/PLATELET
Absolute Monocytes: 570 cells/uL (ref 200–950)
Basophils Absolute: 83 cells/uL (ref 0–200)
Basophils Relative: 1.3 %
Eosinophils Absolute: 621 cells/uL — ABNORMAL HIGH (ref 15–500)
Eosinophils Relative: 9.7 %
HCT: 42.4 % (ref 35.0–45.0)
Hemoglobin: 13.8 g/dL (ref 11.7–15.5)
Lymphs Abs: 2010 cells/uL (ref 850–3900)
MCH: 26.7 pg — ABNORMAL LOW (ref 27.0–33.0)
MCHC: 32.5 g/dL (ref 32.0–36.0)
MCV: 82 fL (ref 80.0–100.0)
MPV: 10.8 fL (ref 7.5–12.5)
Monocytes Relative: 8.9 %
Neutro Abs: 3117 cells/uL (ref 1500–7800)
Neutrophils Relative %: 48.7 %
Platelets: 291 10*3/uL (ref 140–400)
RBC: 5.17 10*6/uL — ABNORMAL HIGH (ref 3.80–5.10)
RDW: 13.8 % (ref 11.0–15.0)
Total Lymphocyte: 31.4 %
WBC: 6.4 10*3/uL (ref 3.8–10.8)

## 2020-02-10 LAB — COMPLETE METABOLIC PANEL WITH GFR
AG Ratio: 1.2 (calc) (ref 1.0–2.5)
ALT: 18 U/L (ref 6–29)
AST: 20 U/L (ref 10–35)
Albumin: 4 g/dL (ref 3.6–5.1)
Alkaline phosphatase (APISO): 118 U/L (ref 37–153)
BUN/Creatinine Ratio: 9 (calc) (ref 6–22)
BUN: 9 mg/dL (ref 7–25)
CO2: 24 mmol/L (ref 20–32)
Calcium: 9.6 mg/dL (ref 8.6–10.4)
Chloride: 105 mmol/L (ref 98–110)
Creat: 1.01 mg/dL — ABNORMAL HIGH (ref 0.50–0.99)
GFR, Est African American: 70 mL/min/{1.73_m2} (ref >=60)
GFR, Est Non African American: 60 mL/min/{1.73_m2} (ref >=60)
Globulin: 3.4 g/dL (calc) (ref 1.9–3.7)
Glucose, Bld: 96 mg/dL (ref 65–99)
Potassium: 3.4 mmol/L — ABNORMAL LOW (ref 3.5–5.3)
Sodium: 141 mmol/L (ref 135–146)
Total Bilirubin: 0.7 mg/dL (ref 0.2–1.2)
Total Protein: 7.4 g/dL (ref 6.1–8.1)

## 2020-02-10 LAB — LIPID PANEL
Cholesterol: 159 mg/dL (ref <200)
HDL: 36 mg/dL — ABNORMAL LOW (ref >=50)
LDL Cholesterol (Calc): 89 mg/dL (calc)
Non-HDL Cholesterol (Calc): 123 mg/dL (calc) (ref <130)
Total CHOL/HDL Ratio: 4.4 (calc) (ref <5.0)
Triglycerides: 245 mg/dL — ABNORMAL HIGH (ref <150)

## 2020-02-10 LAB — MICROALBUMIN, URINE: Microalb, Ur: 19.7 mg/dL

## 2020-02-10 LAB — HEMOGLOBIN A1C
Hgb A1c MFr Bld: 6.2 % of total Hgb — ABNORMAL HIGH (ref <5.7)
Mean Plasma Glucose: 131 (calc)
eAG (mmol/L): 7.3 (calc)

## 2020-02-11 IMAGING — MG DIGITAL DIAGNOSTIC BILATERAL MAMMOGRAM WITH TOMO AND CAD
8 series · 8 of 24 positions shown · non-contrast
Comparison: None

CLINICAL DATA: Patient recalled from screening for bilateral breast
masses.

EXAM:
DIGITAL DIAGNOSTIC BILATERAL MAMMOGRAM WITH CAD AND TOMO
ULTRASOUND BILATERAL BREAST

[R ML synth-2D]
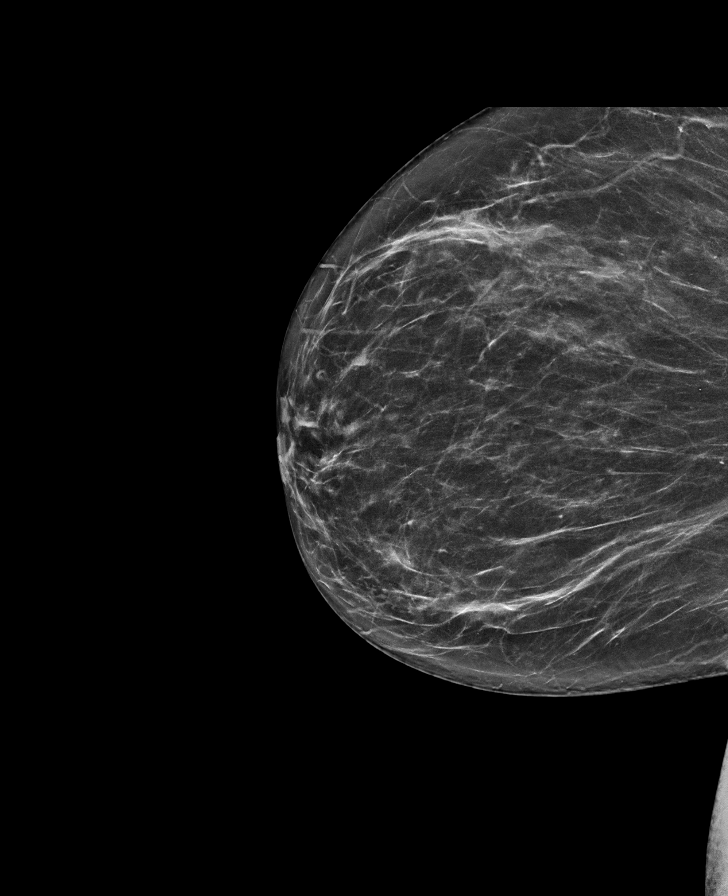

[L ML synth-2D]
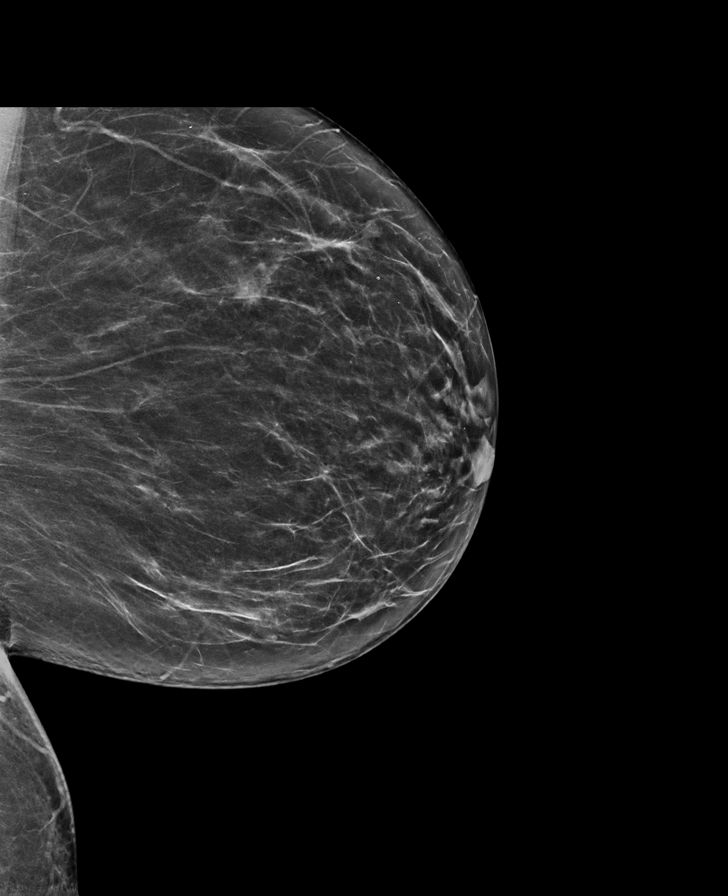

[R CC synth-2D]
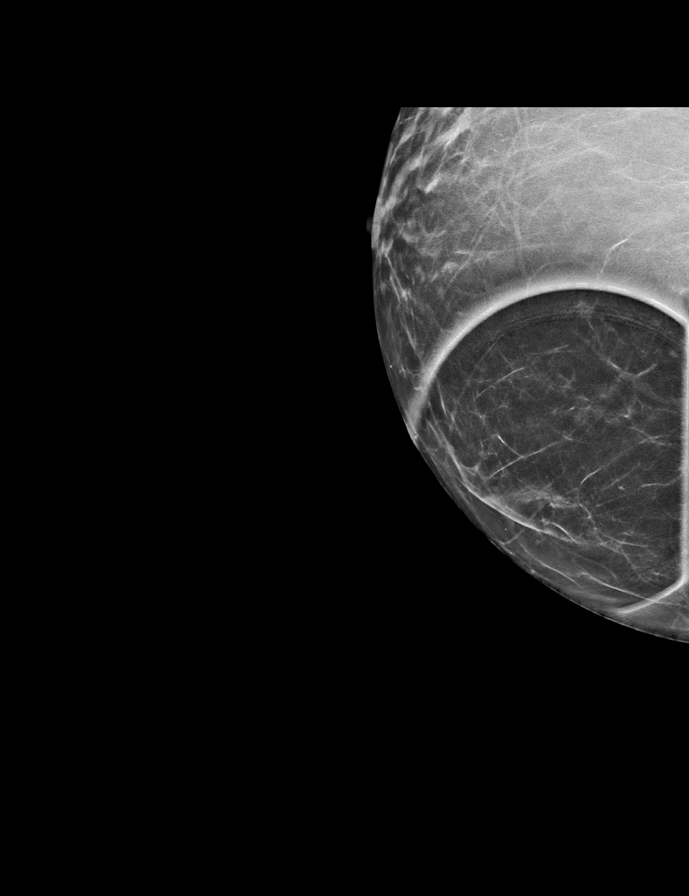

[L CC synth-2D]
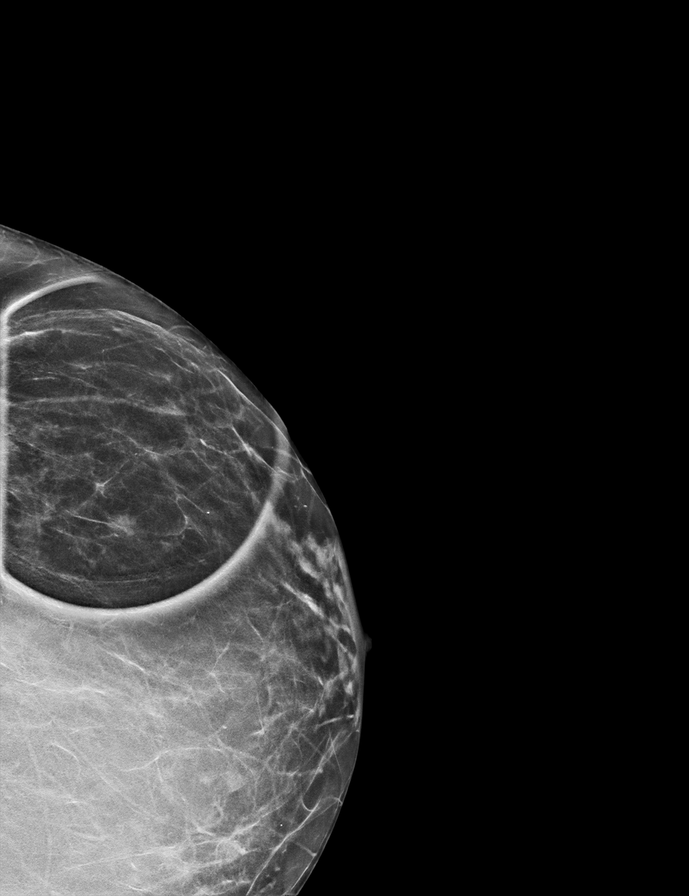

[L CC tomo · tomo slice 28/55.0]
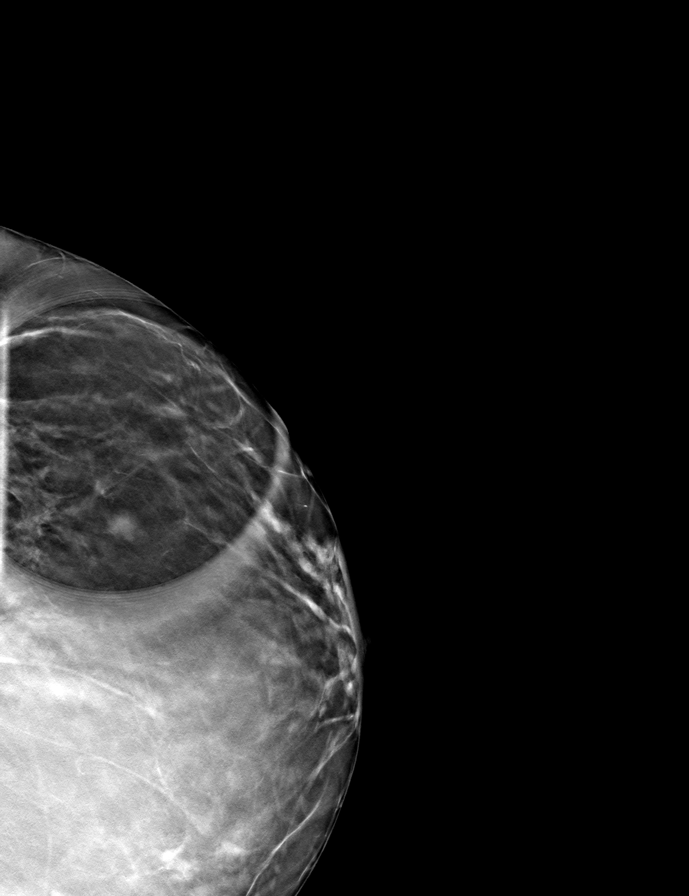

[R CC tomo · tomo slice 25/48.0]
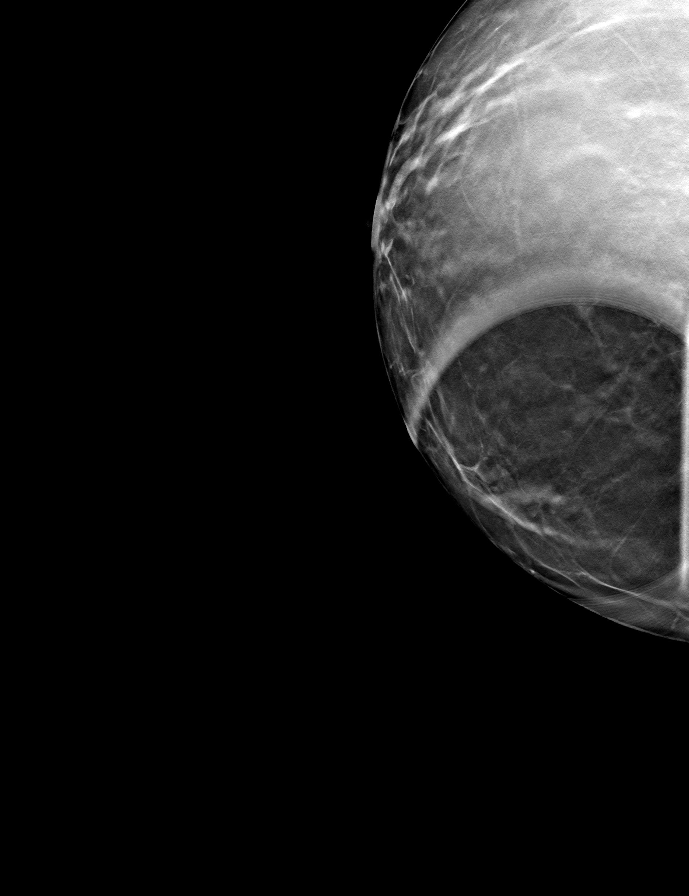

[L ML tomo · tomo slice 37/72.0]
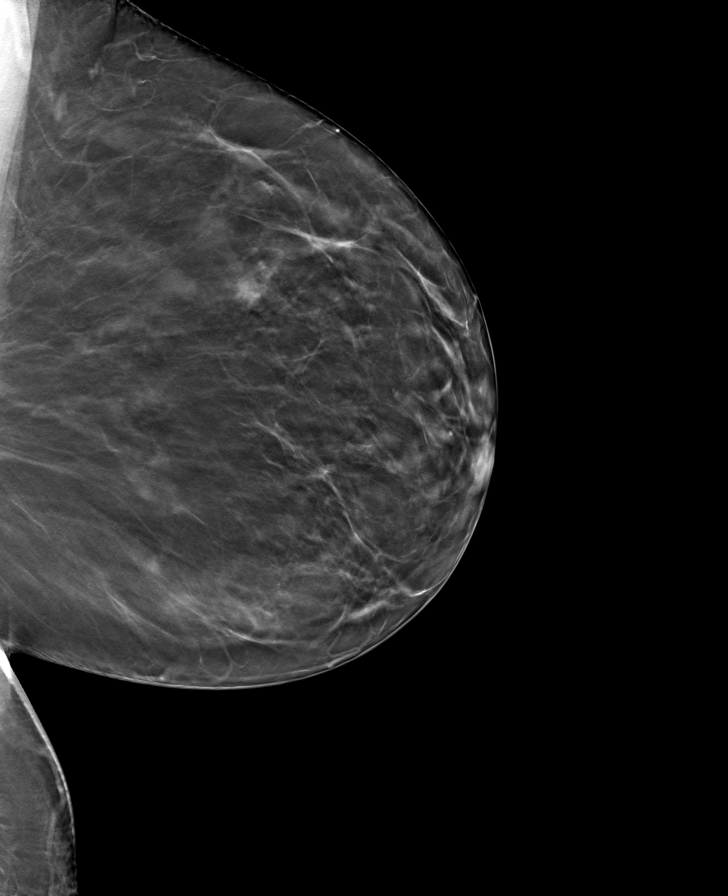

[R ML tomo · tomo slice 35/70.0]
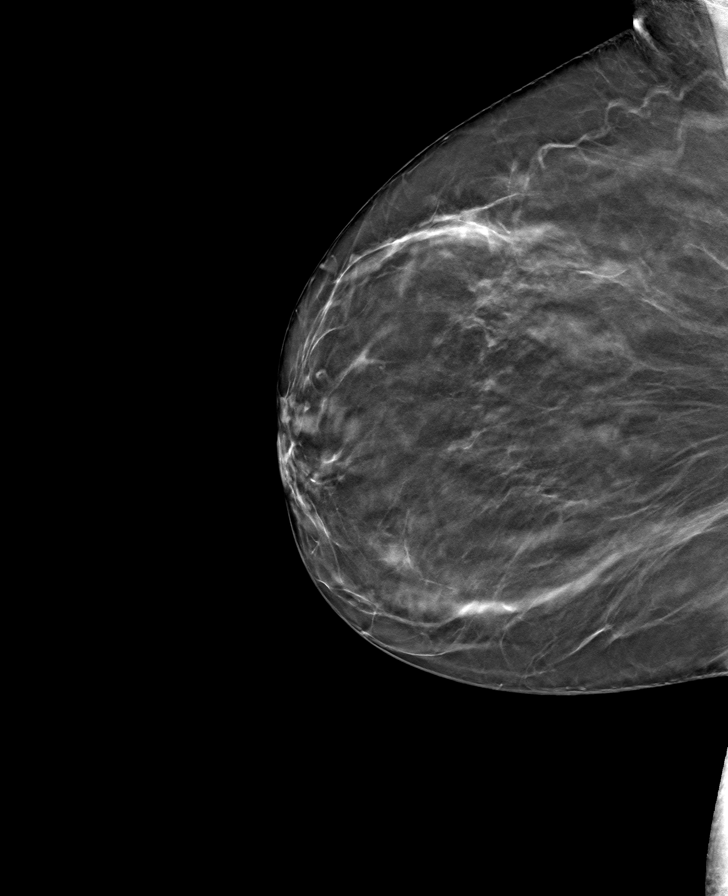

[8 of 24 positions shown; findings below may reference images not displayed]

ACR Breast Density Category b: There are scattered areas of
fibroglandular density.
FINDINGS: Within the upper inner right breast anterior depth there is a small
lobular low-density mass. Within the upper-outer left breast middle
depth there is a persistent oval circumscribed low-density mass.
These were further evaluated with spot compression and true lateral
views.

Mammographic images were processed with CAD.

Targeted ultrasound is performed, showing a 4 x 4 x 4 mm oval
circumscribed hypoechoic mass left breast 1 o'clock position 5 cm
from the nipple.

Within the right breast 1 o'clock position 5 cm from nipple there is
a 5 x 3 x 3 mm oval circumscribed hypoechoic mass.
IMPRESSION: Probably benign bilateral breast masses, favored to represent
complicated cysts.

RECOMMENDATION:
Bilateral diagnostic mammography and ultrasound in 6 months to
ensure stability of probably benign bilateral breast masses.

I have discussed the findings and recommendations with the patient.
Results were also provided in writing at the conclusion of the
visit. If applicable, a reminder letter will be sent to the patient
regarding the next appointment.

BI-RADS CATEGORY  3: Probably benign.

## 2020-02-13 ENCOUNTER — Other Ambulatory Visit: Payer: Self-pay | Admitting: Allergy & Immunology

## 2020-02-13 DIAGNOSIS — J4550 Severe persistent asthma, uncomplicated: Secondary | ICD-10-CM

## 2020-02-16 ENCOUNTER — Ambulatory Visit (INDEPENDENT_AMBULATORY_CARE_PROVIDER_SITE_OTHER): Payer: Medicare HMO

## 2020-02-16 ENCOUNTER — Other Ambulatory Visit: Payer: Self-pay

## 2020-02-16 DIAGNOSIS — J4550 Severe persistent asthma, uncomplicated: Secondary | ICD-10-CM | POA: Diagnosis not present

## 2020-02-21 ENCOUNTER — Other Ambulatory Visit: Payer: Self-pay | Admitting: Nurse Practitioner

## 2020-02-21 DIAGNOSIS — J3089 Other allergic rhinitis: Secondary | ICD-10-CM

## 2020-03-01 ENCOUNTER — Other Ambulatory Visit: Payer: Self-pay

## 2020-03-01 ENCOUNTER — Ambulatory Visit (INDEPENDENT_AMBULATORY_CARE_PROVIDER_SITE_OTHER): Payer: Medicare HMO

## 2020-03-01 DIAGNOSIS — J4550 Severe persistent asthma, uncomplicated: Secondary | ICD-10-CM

## 2020-03-07 ENCOUNTER — Other Ambulatory Visit: Payer: Self-pay | Admitting: Nurse Practitioner

## 2020-03-07 DIAGNOSIS — I10 Essential (primary) hypertension: Secondary | ICD-10-CM

## 2020-03-07 DIAGNOSIS — J4540 Moderate persistent asthma, uncomplicated: Secondary | ICD-10-CM

## 2020-03-09 ENCOUNTER — Other Ambulatory Visit: Payer: Self-pay | Admitting: Nurse Practitioner

## 2020-03-09 DIAGNOSIS — I10 Essential (primary) hypertension: Secondary | ICD-10-CM

## 2020-03-10 ENCOUNTER — Telehealth: Payer: Self-pay | Admitting: Psychiatry

## 2020-03-14 ENCOUNTER — Telehealth: Payer: Self-pay

## 2020-03-14 ENCOUNTER — Encounter: Payer: Self-pay | Admitting: Nurse Practitioner

## 2020-03-14 ENCOUNTER — Ambulatory Visit (INDEPENDENT_AMBULATORY_CARE_PROVIDER_SITE_OTHER): Payer: Medicare HMO | Admitting: Nurse Practitioner

## 2020-03-14 ENCOUNTER — Other Ambulatory Visit: Payer: Self-pay

## 2020-03-14 DIAGNOSIS — Z0000 Encounter for general adult medical examination without abnormal findings: Secondary | ICD-10-CM | POA: Diagnosis not present

## 2020-03-14 NOTE — Progress Notes (Signed)
This service is provided via telemedicine  No vital signs collected/recorded due to the encounter was a telemedicine visit.   Location of patient (ex: home, work):  Home  Patient consents to a telephone visit:  Yes, see encounter dated 03/14/2020  Location of the provider (ex: office, home):  [place]  Name of any referring provider:  N/A  Names of all persons participating in the telemedicine service and their role in the encounter:  Sherrie Mustache, Nurse Practitioner, Carroll Kinds, CMA, and patient.   Time spent on call:  5 minutes with medical assistant

## 2020-03-14 NOTE — Progress Notes (Signed)
Subjective:   Tammie [name] is a 59 y.o. female who presents for Medicare Annual (Subsequent) preventive examination.  Review of Systems     Cardiac Risk Factors include: diabetes mellitus;hypertension;dyslipidemia;obesity (BMI >30kg/m2);sedentary lifestyle     Objective:    There were no vitals filed for this visit. There is no height or weight on file to calculate BMI.  Advanced Directives 03/14/2020 03/12/2019 03/12/2019 01/29/2019 09/30/2018 03/10/2018 06/19/2017  Does Patient Have a Medical Advance Directive? No No No No No No No  Would patient like information on creating a medical advance directive? - - - Yes (MAU/Ambulatory/Procedural Areas - Information given) Yes (MAU/Ambulatory/Procedural Areas - Information given) Yes (MAU/Ambulatory/Procedural Areas - Information given) No - Patient declined    Current Medications (verified) Outpatient Encounter Medications as of 03/14/2020  Medication Sig  . acetaminophen (TYLENOL) 160 MG/5ML suspension Take by mouth every 6 (six) hours as needed.  Marland Kitchen albuterol (VENTOLIN HFA) 108 (90 Base) MCG/ACT inhaler INHALE 2 PUFFS INTO THE LUNGS EVERY 6 HOURS AS NEEDED FOR WHEEZING OR SHORTNESS OF BREATH  . amLODipine (NORVASC) 10 MG tablet TAKE 1 TABLET BY MOUTH EVERY DAY FOR HIGH BLOOD PRESSURE  . bisoprolol (ZEBETA) 5 MG tablet TAKE 1/2 TABLET BY MOUTH EVERY DAY FOR HIGH BLOOD PRESSURE  . cetirizine (ZYRTEC) 10 MG tablet TAKE 1 TABLET(10 MG) BY MOUTH DAILY  . DUPIXENT 300 MG/2ML prefilled syringe RX#2 INJECT 300MG  SUBCUTANEOUSLY EVERY 14 DAYS STARTING ON DAY 15  . EPINEPHrine 0.3 mg/0.3 mL IJ SOAJ injection   . fluticasone (FLONASE) 50 MCG/ACT nasal spray SHAKE LIQUID AND USE 2 SPRAYS IN EACH NOSTRIL DAILY  . Fluticasone-Salmeterol (WIXELA INHUB) 250-50 MCG/DOSE AEPB Inhale 2 puffs into the lungs 2 (two) times daily.  . hydrALAZINE (APRESOLINE) 25 MG tablet TAKE 1 TABLET BY MOUTH EVERY 8 HOURS  . Omega-3 Fatty Acids (FISH OIL) 500 MG CAPS Take 1 capsule  by mouth daily.  Marland Kitchen triamcinolone ointment (KENALOG) 0.5 % Apply 1 application topically 2 (two) times daily.   Facility-Administered Encounter Medications as of 03/14/2020  Medication  . dupilumab (DUPIXENT) prefilled syringe 300 mg    Allergies (verified) Ace inhibitors, Aspirin, Doxepin, Ibuprofen, Nsaids, and Lipitor [atorvastatin]   History: Past Medical History:  Diagnosis Date  . 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 Anemia   . Anxiety   . Eczema   . Environmental allergies   . GERD (gastroesophageal reflux disease)    Patient denies  . Headache(784.0)   . Hypertension   . Moderate intermittent asthma   . Respiratory failure, acute ([place]) 04/19/2014   Related to potential Anaphylaxis ? presumably to ASA - exacerbated by Moderate to Severe Asthma   Past Surgical History:  Procedure Laterality Date  . [place] SINUS SURGERY     Polyps  . TRANSTHORACIC ECHOCARDIOGRAM  04/2014   in setting of cardiopulmonary arrest): EF 65 to 70%.  Mild concentric LVH.  GR 1 DD.  Mild AI.;;  August 2019: EF 55 to 60%.  Mild LVH.  GR 1 DD.  Mild AI/MR.  Mildly increased PA pressures.   Family History  Problem Relation Age of Onset  . Heart attack Father   . Heart attack Mother   . Diabetes Sister   . High blood pressure Brother   . Hypertension Brother   . Asthma Brother   . High blood pressure Sister   . Cancer Sister   . High blood pressure  Son   . Cancer Maternal Grandmother   . Heart disease Paternal Grandmother   . Pancreatic cancer Paternal Grandmother   . Asthma Other        Great paternal grandfather   . Autism Son   . Asthma Other        Neice   . Colon cancer Neg Hx   . Esophageal cancer Neg Hx   . Rectal cancer Neg Hx   . Stomach cancer Neg Hx   . Allergic rhinitis Neg Hx   . Angioedema Neg Hx   . Eczema Neg Hx   . Immunodeficiency Neg Hx   . Atopy Neg Hx    Social History   Socioeconomic History  . Marital  status: Single    Spouse name: Not on file  . Number of children: Not on file  . Years of education: Not on file  . Highest education level: Not on file  Occupational History  . Not on file  Tobacco Use  . Smoking status: Never Smoker  . Smokeless tobacco: Never Used  Vaping Use  . Vaping Use: Never used  Substance and Sexual Activity  . Alcohol use: No  . Drug use: No  . Sexual activity: Never  Other Topics Concern  . Not on file  Social History Narrative   Diet-Salt Free   Drinks [employer]    Lives in an apartment, one stories, one person in home, no pets   Current/past profession: Dietary, dish-room, and cooks    Exercises 2 x weekly   Social Determinants of Health   Financial Resource Strain:   . Difficulty of Paying Living Expenses:   Food Insecurity:   . Worried About Charity fundraiser in the Last Year:   . Arboriculturist in the Last Year:   Transportation Needs:   . Film/video editor (Medical):   Marland Kitchen Lack of Transportation (Non-Medical):   Physical Activity:   . Days of Exercise per Week:   . Minutes of Exercise per Session:   Stress:   . Feeling of Stress :   Social Connections:   . Frequency of Communication with Friends and Family:   . Frequency of Social Gatherings with Friends and Family:   . Attends Religious Services:   . Active Member of Clubs or Organizations:   . Attends Archivist Meetings:   Marland Kitchen Marital Status:     Tobacco Counseling Counseling given: Not Answered   Clinical Intake:  Pre-visit preparation completed: Yes        BMI - recorded: 34 Nutritional Status: BMI > 30  Obese Nutritional Risks: None Diabetes: Yes  How often do you need to have someone help you when you read instructions, pamphlets, or other written materials from your doctor or pharmacy?: 1 - Never  Diabetic?yes         Activities of Daily Living In your present state of health, do you have any difficulty performing the following  activities: 03/14/2020  Hearing? N  Vision? N  Difficulty concentrating or making decisions? N  Walking or climbing stairs? N  Dressing or bathing? N  Doing errands, shopping? N  Preparing Food and eating ? N  Using the Toilet? N  In the past six months, have you accidently leaked urine? N  Do you have problems with loss of bowel control? N  Managing your Medications? N  Managing your Finances? N  Housekeeping or managing your Housekeeping? N  Some recent data might be hidden  Patient Care Team: [name], NP as PCP - General (Geriatric Medicine) [name], MD as Consulting Physician (Ophthalmology)  Indicate any recent Medical Services you may have received from other than [facility] providers in the past year (date may be approximate).     Assessment:   This is a routine wellness examination for Rokhaya.  Hearing/Vision screen  Hearing Screening   125Hz  250Hz  500Hz  1000Hz  2000Hz  3000Hz  4000Hz  6000Hz  8000Hz   Right ear:           Left ear:           Comments: No hearing problems  Vision Screening Comments: No vision problems. Scheduled for exam next month  Dietary issues and exercise activities discussed: Current Exercise Habits: The patient does not participate in regular exercise at present  Goals    . Exercise 3x per week (30 min per time)     Patient would like to start exercising 3 days a week.      Depression Screen PHQ 2/9 Scores 03/14/2020 07/12/2019 03/12/2019 03/10/2018 02/12/2017 02/07/2017 02/07/2016  PHQ - 2 Score 0 0 0 0 0 0 0    Fall Risk Fall Risk  03/14/2020 02/09/2020 11/10/2019 07/12/2019 03/12/2019  Falls in the past year? 0 0 0 0 0  Number falls in past yr: 0 - 0 0 -  Injury with Fall? 0 - 0 0 0  Comment - - - - -    Any stairs in or around the home? No  If so, are there any without handrails? No  Home free of loose throw rugs in walkways, pet beds, electrical cords, etc? Yes  Adequate lighting in your home to reduce risk of falls? Yes    ASSISTIVE DEVICES UTILIZED TO PREVENT FALLS:  Life alert? No  Use of a cane, walker or w/c? No  Grab bars in the bathroom? No  Shower chair or bench in shower? No  Elevated toilet seat or a handicapped toilet? No   TIMED UP AND GO: na  Cognitive Function: MMSE - Mini Mental State Exam 02/07/2017  Not completed: (No Data)     6CIT Screen 03/14/2020  What Year? 0 points  What month? 0 points  What time? 0 points  Count back from 20 0 points  Months in reverse 0 points  Repeat phrase 4 points  Total Score 4    Immunizations Immunization History  Administered Date(s) Administered  . Influenza Inj Mdck Quad Pf 06/29/2019  . Influenza,inj,Quad PF,6+ Mos 05/26/2014, 06/21/2016, 05/27/2017, 05/19/2018, 03/30/2019  . Influenza-Unspecified 05/06/2015, 05/23/2017  . PFIZER SARS-COV-2 Vaccination 10/21/2019, 11/16/2019  . Pneumococcal Conjugate-13 12/17/2016  . Pneumococcal Polysaccharide-23 03/12/2019, 06/29/2019  . Tdap 07/22/2017  . Zoster Recombinat (Shingrix) 12/17/2016    TDAP status: Up to date Flu Vaccine status: Up to date Pneumococcal vaccine status: Up to date Covid-19 vaccine status: Completed vaccines  Qualifies for Shingles Vaccine? Yes   Zostavax completed No   Shingrix Completed?: No.    Education has been provided regarding the importance of this vaccine. Patient has been advised to call insurance company to determine out of pocket expense if they have not yet received this vaccine. Advised may also receive vaccine at local pharmacy or Health Dept. Verbalized acceptance and understanding.  Screening Tests Health Maintenance  Topic Date Due  . INFLUENZA VACCINE  03/05/2020  . COLONOSCOPY  06/19/2020  . MAMMOGRAM  05/09/2021  . PAP SMEAR-Modifier  03/11/2022  . TETANUS/TDAP  07/23/2027  . COVID-19 Vaccine  Completed  . HIV  Screening  Completed  . Hepatitis C Screening  Discontinued    Health Maintenance  Health Maintenance Due  Topic Date Due  .  INFLUENZA VACCINE  03/05/2020    Colorectal cancer screening: Completed 06/19/2017. Repeat every 3 years Mammogram status: Completed 05/2019. Repeat every year   Lung Cancer Screening: (Low Dose CT Chest recommended if Age 70-80 years, 30 pack-year currently smoking OR have quit w/in 15years.) does not qualify.   Lung Cancer Screening Referral:ns  Additional Screening:  Hepatitis C Screening: does qualify; will complete with next blood draw Vision Screening: Recommended annual ophthalmology exams for early detection of glaucoma and other disorders of the eye. Is the patient up to date with their annual eye exam?  Yes  Who is the provider or what is the name of the office in which the patient attends annual eye exams? Dr Katy Fitch  If pt is not established with a provider, would they like to be referred to a provider to establish care? No .   Dental Screening: Recommended annual dental exams for proper oral hygiene  Community Resource Referral / Chronic Care Management: CRR required this visit?  No   CCM required this visit?  No      Plan:     I have personally reviewed and noted the following in the patient's chart:   . Medical and social history . Use of alcohol, tobacco or illicit drugs  . Current medications and supplements . Functional ability and status . Nutritional status . Physical activity . Advanced directives . List of other physicians . Hospitalizations, surgeries, and ER visits in previous 12 months . Vitals . Screenings to include cognitive, depression, and falls . Referrals and appointments  In addition, I have reviewed and discussed with patient certain preventive protocols, quality metrics, and best practice recommendations. A written personalized care plan for preventive services as well as general preventive health recommendations were provided to patient.     [name], NP   03/14/2020

## 2020-03-14 NOTE — Telephone Encounter (Signed)
[name], Tammie [name] are scheduled for a virtual visit with your provider today.    Just as we do with appointments in the office, we must obtain your consent to participate.  Your consent will be active for this visit and any virtual visit you may have with one of our providers in the next 365 days.    If you have a MyChart account, I can also send a copy of this consent to you electronically.  All virtual visits are billed to your insurance company just like a traditional visit in the office.  As this is a virtual visit, video technology does not allow for your provider to perform a traditional examination.  This may limit your provider's ability to fully assess your condition.  If your provider identifies any concerns that need to be evaluated in person or the need to arrange testing such as labs, EKG, etc, we will make arrangements to do so.    Although advances in technology are sophisticated, we cannot ensure that it will always work on either your end or our end.  If the connection with a video visit is poor, we may have to switch to a telephone visit.  With either a video or telephone visit, we are not always able to ensure that we have a secure connection.   I need to obtain your verbal consent now.   Are you willing to proceed with your visit today?   Tammie [name] has provided verbal consent on 03/14/2020 for a virtual visit (video or telephone).   Carroll Kinds, CMA 03/14/2020  11:02 AM

## 2020-03-14 NOTE — Patient Instructions (Signed)
Tammie [name] , Thank you for taking time to come for your Medicare Wellness Visit. I appreciate your ongoing commitment to your health goals. Please review the following plan we discussed and let me know if I can assist you in the future.   Screening recommendations/referrals: Colonoscopy up to date Mammogram up to date Bone Density -to start at 39 Recommended yearly ophthalmology/optometry visit for glaucoma screening and checkup Recommended yearly dental visit for hygiene and checkup  Vaccinations: Influenza vaccine DUE when available to you Pneumococcal vaccine up to date Tdap vaccine up to date Shingles vaccine To get Second shot    Advanced directives: to complete and bring back to office   Conditions/risks identified: decrease in physical activity, obestiy  Next appointment: 05/10/2020 and 1 year for AWV   Preventive Care 40-64 Years, Female Preventive care refers to lifestyle choices and visits with your health care provider that can promote health and wellness. What does preventive care include?  A yearly physical exam. This is also called an annual well check.  Dental exams once or twice a year.  Routine eye exams. Ask your health care provider how often you should have your eyes checked.  Personal lifestyle choices, including:  Daily care of your teeth and gums.  Regular physical activity.  Eating a healthy diet.  Avoiding tobacco and drug use.  Limiting alcohol use.  Practicing safe sex.  Taking low-dose aspirin daily starting at age 60.  Taking vitamin and mineral supplements as recommended by your health care provider. What happens during an annual well check? The services and screenings done by your health care provider during your annual well check will depend on your age, overall health, lifestyle risk factors, and family history of disease. Counseling  Your health care provider may ask you questions about your:  Alcohol use.  Tobacco use.  Drug  use.  Emotional well-being.  Home and relationship well-being.  Sexual activity.  Eating habits.  Work and work Statistician.  Method of birth control.  Menstrual cycle.  Pregnancy history. Screening  You may have the following tests or measurements:  Height, weight, and BMI.  Blood pressure.  Lipid and cholesterol levels. These may be checked every 5 years, or more frequently if you are over 98 years old.  Skin check.  Lung cancer screening. You may have this screening every year starting at age 60 if you have a 30-pack-year history of smoking and currently smoke or have quit within the past 15 years.  Fecal occult blood test (FOBT) of the stool. You may have this test every year starting at age 65.  Flexible sigmoidoscopy or colonoscopy. You may have a sigmoidoscopy every 5 years or a colonoscopy every 10 years starting at age 21.  Hepatitis C blood test.  Hepatitis B blood test.  Sexually transmitted disease (STD) testing.  Diabetes screening. This is done by checking your blood sugar (glucose) after you have not eaten for a while (fasting). You may have this done every 1-3 years.  Mammogram. This may be done every 1-2 years. Talk to your health care provider about when you should start having regular mammograms. This may depend on whether you have a family history of breast cancer.  BRCA-related cancer screening. This may be done if you have a family history of breast, ovarian, tubal, or peritoneal cancers.  Pelvic exam and Pap test. This may be done every 3 years starting at age 23. Starting at age 9, this may be done every 5 years if  you have a Pap test in combination with an HPV test.  Bone density scan. This is done to screen for osteoporosis. You may have this scan if you are at high risk for osteoporosis. Discuss your test results, treatment options, and if necessary, the need for more tests with your health care provider. Vaccines  Your health care  provider may recommend certain vaccines, such as:  Influenza vaccine. This is recommended every year.  Tetanus, diphtheria, and acellular pertussis (Tdap, Td) vaccine. You may need a Td booster every 10 years.  Zoster vaccine. You may need this after age 60.  Pneumococcal 13-valent conjugate (PCV13) vaccine. You may need this if you have certain conditions and were not previously vaccinated.  Pneumococcal polysaccharide (PPSV23) vaccine. You may need one or two doses if you smoke cigarettes or if you have certain conditions. Talk to your health care provider about which screenings and vaccines you need and how often you need them. This information is not intended to replace advice given to you by your health care provider. Make sure you discuss any questions you have with your health care provider. Document Released: 08/18/2015 Document Revised: 04/10/2016 Document Reviewed: 05/23/2015 Elsevier Interactive Patient Education  2017 [employer]    Fall Prevention in the Home Falls can cause injuries. They can happen to people of all ages. There are many things you can do to make your home safe and to help prevent falls. What can I do on the outside of my home?  Regularly fix the edges of walkways and driveways and fix any cracks.  Remove anything that might make you trip as you walk through a door, such as a raised step or threshold.  Trim any bushes or trees on the path to your home.  Use bright outdoor lighting.  Clear any walking paths of anything that might make someone trip, such as rocks or tools.  Regularly check to see if handrails are loose or broken. Make sure that both sides of any steps have handrails.  Any raised decks and porches should have guardrails on the edges.  Have any leaves, snow, or ice cleared regularly.  Use sand or salt on walking paths during winter.  Clean up any spills in your garage right away. This includes oil or grease spills. What can I do  in the bathroom?  Use night lights.  Install grab bars by the toilet and in the tub and shower. Do not use towel bars as grab bars.  Use non-skid mats or decals in the tub or shower.  If you need to sit down in the shower, use a plastic, non-slip stool.  Keep the floor dry. Clean up any water that spills on the floor as soon as it happens.  Remove soap buildup in the tub or shower regularly.  Attach bath mats securely with double-sided non-slip rug tape.  Do not have throw rugs and other things on the floor that can make you trip. What can I do in the bedroom?  Use night lights.  Make sure that you have a light by your bed that is easy to reach.  Do not use any sheets or blankets that are too big for your bed. They should not hang down onto the floor.  Have a firm chair that has side arms. You can use this for support while you get dressed.  Do not have throw rugs and other things on the floor that can make you trip. What can I do in the   kitchen?  Clean up any spills right away.  Avoid walking on wet floors.  Keep items that you use a lot in easy-to-reach places.  If you need to reach something above you, use a strong step stool that has a grab bar.  Keep electrical cords out of the way.  Do not use floor polish or wax that makes floors slippery. If you must use wax, use non-skid floor wax.  Do not have throw rugs and other things on the floor that can make you trip. What can I do with my stairs?  Do not leave any items on the stairs.  Make sure that there are handrails on both sides of the stairs and use them. Fix handrails that are broken or loose. Make sure that handrails are as long as the stairways.  Check any carpeting to make sure that it is firmly attached to the stairs. Fix any carpet that is loose or worn.  Avoid having throw rugs at the top or bottom of the stairs. If you do have throw rugs, attach them to the floor with carpet tape.  Make sure that you  have a light switch at the top of the stairs and the bottom of the stairs. If you do not have them, ask someone to add them for you. What else can I do to help prevent falls?  Wear shoes that:  Do not have high heels.  Have rubber bottoms.  Are comfortable and fit you well.  Are closed at the toe. Do not wear sandals.  If you use a stepladder:  Make sure that it is fully opened. Do not climb a closed stepladder.  Make sure that both sides of the stepladder are locked into place.  Ask someone to hold it for you, if possible.  Clearly mark and make sure that you can see:  Any grab bars or handrails.  First and last steps.  Where the edge of each step is.  Use tools that help you move around (mobility aids) if they are needed. These include:  Canes.  Walkers.  Scooters.  Crutches.  Turn on the lights when you go into a dark area. Replace any light bulbs as soon as they burn out.  Set up your furniture so you have a clear path. Avoid moving your furniture around.  If any of your floors are uneven, fix them.  If there are any pets around you, be aware of where they are.  Review your medicines with your doctor. Some medicines can make you feel dizzy. This can increase your chance of falling. Ask your doctor what other things that you can do to help prevent falls. This information is not intended to replace advice given to you by your health care provider. Make sure you discuss any questions you have with your health care provider. Document Released: 05/18/2009 Document Revised: 12/28/2015 Document Reviewed: 08/26/2014 Elsevier Interactive Patient Education  2017 Elsevier Inc.   

## 2020-03-15 ENCOUNTER — Ambulatory Visit (INDEPENDENT_AMBULATORY_CARE_PROVIDER_SITE_OTHER): Payer: Medicare HMO

## 2020-03-15 ENCOUNTER — Other Ambulatory Visit: Payer: Self-pay

## 2020-03-15 DIAGNOSIS — J4550 Severe persistent asthma, uncomplicated: Secondary | ICD-10-CM

## 2020-03-29 ENCOUNTER — Other Ambulatory Visit: Payer: Self-pay

## 2020-03-29 ENCOUNTER — Ambulatory Visit (INDEPENDENT_AMBULATORY_CARE_PROVIDER_SITE_OTHER): Payer: Medicare HMO | Admitting: *Deleted

## 2020-03-29 DIAGNOSIS — J4550 Severe persistent asthma, uncomplicated: Secondary | ICD-10-CM

## 2020-04-12 ENCOUNTER — Ambulatory Visit (INDEPENDENT_AMBULATORY_CARE_PROVIDER_SITE_OTHER): Payer: Medicare HMO

## 2020-04-12 ENCOUNTER — Other Ambulatory Visit: Payer: Self-pay

## 2020-04-12 DIAGNOSIS — J4550 Severe persistent asthma, uncomplicated: Secondary | ICD-10-CM | POA: Diagnosis not present

## 2020-04-20 DIAGNOSIS — H40013 Open angle with borderline findings, low risk, bilateral: Secondary | ICD-10-CM | POA: Diagnosis not present

## 2020-04-20 DIAGNOSIS — H0102A Squamous blepharitis right eye, upper and lower eyelids: Secondary | ICD-10-CM | POA: Diagnosis not present

## 2020-04-20 DIAGNOSIS — H0102B Squamous blepharitis left eye, upper and lower eyelids: Secondary | ICD-10-CM | POA: Diagnosis not present

## 2020-04-20 DIAGNOSIS — H25813 Combined forms of age-related cataract, bilateral: Secondary | ICD-10-CM | POA: Diagnosis not present

## 2020-04-20 LAB — HM DIABETES EYE EXAM

## 2020-04-26 ENCOUNTER — Ambulatory Visit (INDEPENDENT_AMBULATORY_CARE_PROVIDER_SITE_OTHER): Payer: Medicare HMO

## 2020-04-26 DIAGNOSIS — J4550 Severe persistent asthma, uncomplicated: Secondary | ICD-10-CM

## 2020-05-05 ENCOUNTER — Ambulatory Visit: Payer: Medicare HMO | Admitting: Pulmonary Disease

## 2020-05-10 ENCOUNTER — Encounter: Payer: Self-pay | Admitting: Nurse Practitioner

## 2020-05-10 ENCOUNTER — Telehealth: Payer: Self-pay

## 2020-05-10 ENCOUNTER — Ambulatory Visit (INDEPENDENT_AMBULATORY_CARE_PROVIDER_SITE_OTHER): Payer: Medicare HMO | Admitting: Nurse Practitioner

## 2020-05-10 ENCOUNTER — Other Ambulatory Visit: Payer: Self-pay

## 2020-05-10 ENCOUNTER — Ambulatory Visit (INDEPENDENT_AMBULATORY_CARE_PROVIDER_SITE_OTHER): Payer: Medicare HMO | Admitting: *Deleted

## 2020-05-10 VITALS — BP 110/68 | HR 68 | Temp 97.1°F | Ht <= 58 in | Wt 152.0 lb

## 2020-05-10 DIAGNOSIS — E119 Type 2 diabetes mellitus without complications: Secondary | ICD-10-CM | POA: Diagnosis not present

## 2020-05-10 DIAGNOSIS — J4540 Moderate persistent asthma, uncomplicated: Secondary | ICD-10-CM

## 2020-05-10 DIAGNOSIS — F411 Generalized anxiety disorder: Secondary | ICD-10-CM | POA: Diagnosis not present

## 2020-05-10 DIAGNOSIS — J4550 Severe persistent asthma, uncomplicated: Secondary | ICD-10-CM

## 2020-05-10 DIAGNOSIS — I10 Essential (primary) hypertension: Secondary | ICD-10-CM

## 2020-05-10 DIAGNOSIS — Z23 Encounter for immunization: Secondary | ICD-10-CM | POA: Diagnosis not present

## 2020-05-10 DIAGNOSIS — E782 Mixed hyperlipidemia: Secondary | ICD-10-CM | POA: Diagnosis not present

## 2020-05-10 DIAGNOSIS — K089 Disorder of teeth and supporting structures, unspecified: Secondary | ICD-10-CM

## 2020-05-10 DIAGNOSIS — J302 Other seasonal allergic rhinitis: Secondary | ICD-10-CM

## 2020-05-10 NOTE — Progress Notes (Signed)
Careteam: Patient Care Team: [name], NP as PCP - General (Geriatric Medicine) [name], MD as Consulting Physician (Ophthalmology)  PLACE OF SERVICE:  [facility] [place] Directive information Does Patient Have a Medical Advance Directive?: No, Would patient like information on creating a medical advance directive?: Yes (MAU/Ambulatory/Procedural Areas - Information given)  Allergies  Allergen Reactions  . Ace Inhibitors Other (See Comments)    Angioedema  . Aspirin Anaphylaxis, Swelling and Other (See Comments)    Throat swelling   . Doxepin Anaphylaxis  . Ibuprofen Anaphylaxis, Swelling and Other (See Comments)    Throat swelling  . Nsaids Anaphylaxis, Swelling and Other (See Comments)  . Lipitor [Atorvastatin] Swelling and Other (See Comments)    Face, feet swell    Chief Complaint  Patient presents with  . Medical Management of Chronic Issues    3 month follow-up. Flu vaccine today      HPI: Patient is a 59 y.o. female for routine follow up.   DM- working on diet, has lost weight with dietary modification and increase in walking  Hypertension- well controlled on bisoprolol 5 mg daily, norvasc 10 mg daily and hydralazine 25 mg TID. She has significantly cut back on her sodium.   allergies and asthma- continues on dupixent, Flonase and fluticasone-salmeterol.   Got shingles shot and flu shot today Went and had eye exam  Has not gone to the dentist yet.   Review of Systems:  Review of Systems  Constitutional: Negative for chills, fever and weight loss.  HENT: Negative for tinnitus.        Poor dentition   Respiratory: Negative for cough, sputum production and shortness of breath.   Cardiovascular: Negative for chest pain, palpitations and leg swelling.  Gastrointestinal: Negative for abdominal pain, constipation, diarrhea and heartburn.  Genitourinary: Negative for dysuria, frequency and urgency.  Musculoskeletal: Negative for back  pain, falls, joint pain and myalgias.  Skin: Negative.   Neurological: Negative for dizziness and headaches.  Endo/Heme/Allergies: Positive for environmental allergies.  Psychiatric/Behavioral: Negative for depression and memory loss. The patient does not have insomnia.     Past Medical History:  Diagnosis Date  . 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 Anemia   . Anxiety   . Eczema   . Environmental allergies   . GERD (gastroesophageal reflux disease)    Patient denies  . Headache(784.0)   . Hypertension   . Moderate intermittent asthma   . Respiratory failure, acute ([place]) 04/19/2014   Related to potential Anaphylaxis ? presumably to ASA - exacerbated by Moderate to Severe Asthma   Past Surgical History:  Procedure Laterality Date  . [place] SINUS SURGERY     Polyps  . TRANSTHORACIC ECHOCARDIOGRAM  04/2014   in setting of cardiopulmonary arrest): EF 65 to 70%.  Mild concentric LVH.  GR 1 DD.  Mild AI.;;  August 2019: EF 55 to 60%.  Mild LVH.  GR 1 DD.  Mild AI/MR.  Mildly increased PA pressures.   Social History:   reports that she has never smoked. She has never used smokeless tobacco. She reports that she does not drink alcohol and does not use drugs.  Family History  Problem Relation Age of Onset  . Heart attack Father   . Heart attack Mother   . Diabetes Sister   . High blood pressure Brother   . Hypertension Brother   . Asthma  Brother   . High blood pressure Sister   . Cancer Sister   . High blood pressure Son   . Cancer Maternal Grandmother   . Heart disease Paternal Grandmother   . Pancreatic cancer Paternal Grandmother   . Asthma Other        Great paternal grandfather   . Autism Son   . Asthma Other        Neice   . Colon cancer Neg Hx   . Esophageal cancer Neg Hx   . Rectal cancer Neg Hx   . Stomach cancer Neg Hx   . Allergic rhinitis Neg Hx   . Angioedema Neg Hx   . Eczema Neg Hx   . Immunodeficiency  Neg Hx   . Atopy Neg Hx     Medications: Patient's Medications  New Prescriptions   No medications on file  Previous Medications   ACETAMINOPHEN (TYLENOL) 160 MG/5ML SUSPENSION    Take by mouth every 6 (six) hours as needed.   ALBUTEROL (VENTOLIN HFA) 108 (90 BASE) MCG/ACT INHALER    INHALE 2 PUFFS INTO THE LUNGS EVERY 6 HOURS AS NEEDED FOR WHEEZING OR SHORTNESS OF BREATH   AMLODIPINE (NORVASC) 10 MG TABLET    TAKE 1 TABLET BY MOUTH EVERY DAY FOR HIGH BLOOD PRESSURE   BISOPROLOL (ZEBETA) 5 MG TABLET    TAKE 1/2 TABLET BY MOUTH EVERY DAY FOR HIGH BLOOD PRESSURE   CETIRIZINE (ZYRTEC) 10 MG TABLET    TAKE 1 TABLET(10 MG) BY MOUTH DAILY   DUPIXENT 300 MG/2ML PREFILLED SYRINGE    RX#2 INJECT 300MG  SUBCUTANEOUSLY EVERY 14 DAYS STARTING ON DAY 15   EPINEPHRINE 0.3 MG/0.3 ML IJ SOAJ INJECTION       FLUTICASONE (FLONASE) 50 MCG/ACT NASAL SPRAY    SHAKE LIQUID AND USE 2 SPRAYS IN EACH NOSTRIL DAILY   FLUTICASONE-SALMETEROL (WIXELA INHUB) 250-50 MCG/DOSE AEPB    Inhale 2 puffs into the lungs 2 (two) times daily.   HYDRALAZINE (APRESOLINE) 25 MG TABLET    TAKE 1 TABLET BY MOUTH EVERY 8 HOURS   OMEGA-3 FATTY ACIDS (FISH OIL) 500 MG CAPS    Take 1 capsule by mouth daily.   TRIAMCINOLONE OINTMENT (KENALOG) 0.5 %    Apply 1 application topically 2 (two) times daily.  Modified Medications   No medications on file  Discontinued Medications   No medications on file    Physical Exam:  Vitals:   05/10/20 1021  BP: 110/68  Pulse: 68  Temp: (!) 97.1 F (36.2 C)  TempSrc: Temporal  SpO2: 94%  Weight: 152 lb (68.9 kg)  Height: 4\' 9"  (1.448 m)   Body mass index is 32.89 kg/m. Wt Readings from Last 3 Encounters:  05/10/20 152 lb (68.9 kg)  02/09/20 157 lb 3.2 oz (71.3 kg)  01/07/20 157 lb (71.2 kg)    Physical Exam Constitutional:      General: She is not in acute distress.    Appearance: She is well-developed. She is not diaphoretic.  HENT:     Head: Normocephalic and atraumatic.      Nose: Congestion and rhinorrhea present.     Mouth/Throat:     Pharynx: No oropharyngeal exudate or posterior oropharyngeal erythema.  Eyes:     Conjunctiva/sclera: Conjunctivae normal.     Pupils: Pupils are equal, round, and reactive to light.  Cardiovascular:     Rate and Rhythm: Normal rate and regular rhythm.     Heart sounds: Normal heart sounds.  Pulmonary:  Effort: Pulmonary effort is normal.     Breath sounds: Normal breath sounds.  Abdominal:     General: Bowel sounds are normal.     Palpations: Abdomen is soft.  Musculoskeletal:        General: No tenderness.     Cervical back: Normal range of motion and neck supple.  Skin:    General: Skin is warm and dry.  Neurological:     Mental Status: She is alert and oriented to person, place, and time.  Psychiatric:        Mood and Affect: Mood normal.        Behavior: Behavior normal.     Labs reviewed: Basic Metabolic Panel: Recent Labs    07/12/19 1107 11/10/19 1019 02/09/20 1120  NA 141 143 141  K 3.3* 4.0 3.4*  CL 103 105 105  CO2 30 31 24   GLUCOSE 111* 120* 96  BUN 10 10 9   CREATININE 0.98 1.11* 1.01*  CALCIUM 9.9 9.8 9.6  TSH  --  1.96  --    Liver Function Tests: Recent Labs    11/10/19 1019 02/09/20 1120  AST 16 20  ALT 15 18  BILITOT 0.7 0.7  PROT 7.4 7.4   No results for input(s): LIPASE, AMYLASE in the last 8760 hours. No results for input(s): AMMONIA in the last 8760 hours. CBC: Recent Labs    11/10/19 1019 02/09/20 1120  WBC 6.8 6.4  NEUTROABS 3,278 3,117  HGB 13.5 13.8  HCT 40.5 42.4  MCV 81.8 82.0  PLT 315 291   Lipid Panel: Recent Labs    07/12/19 1107 02/09/20 1120  CHOL 168 159  HDL 40* 36*  LDLCALC 97 89  TRIG 216* 245*  CHOLHDL 4.2 4.4   TSH: Recent Labs    11/10/19 1019  TSH 1.96   A1C: Lab Results  Component Value Date   HGBA1C 6.2 (H) 02/09/2020     Assessment/Plan 1. Moderate persistent asthma without complication Controlled on  fluticasone-salmeterol twice daily - Flu Vaccine QUAD High Dose(Fluad)  2. Need for influenza vaccination - Flu Vaccine QUAD High Dose(Fluad)  3. Diabetes mellitus without complication ([place]) -making dietary modifications at this time. Has lost weight with increase activity and diet changes.  Encouraged dietary compliance, routine foot care/monitoring and to keep up with diabetic eye exams through ophthalmology  - Flu Vaccine QUAD High Dose(Fluad)  4. Mixed hyperlipidemia LDL goal <70, will get fasting labs at follow up.   5. Generalized anxiety disorder Stable, without worsening anxiety. Not currently on medication.  6. Chronic seasonal allergic rhinitis Stable at this time continues on dupilumab every 2 weeks with flonase  7. Poor dentition Again educated about need for dental care.   8. Essential hypertension Well controlled at this. To continue to cut back on sodium for a low sodium diet. Will continue current regimen at this time.    Next appt: 3 months with labs at appt.  Carlos American. [place], [facility] [phone]

## 2020-05-10 NOTE — Patient Instructions (Signed)
Continue to work on cutting back on sodas- diet soda better than with regular sugar due to diabetes Juice has a lot of sugar    DASH Eating Plan DASH stands for "Dietary Approaches to Stop Hypertension." The DASH eating plan is a healthy eating plan that has been shown to reduce high blood pressure (hypertension). It may also reduce your risk for type 2 diabetes, heart disease, and stroke. The DASH eating plan may also help with weight loss. What are tips for following this plan?  General guidelines  Avoid eating more than 2,300 mg (milligrams) of salt (sodium) a day. If you have hypertension, you may need to reduce your sodium intake to 1,500 mg a day.  Limit alcohol intake to no more than 1 drink a day for nonpregnant women and 2 drinks a day for men. One drink equals 12 oz of beer, 5 oz of wine, or 1 oz of hard liquor.  Work with your health care provider to maintain a healthy body weight or to lose weight. Ask what an ideal weight is for you.  Get at least 30 minutes of exercise that causes your heart to beat faster (aerobic exercise) most days of the week. Activities may include walking, swimming, or biking.  Work with your health care provider or diet and nutrition specialist (dietitian) to adjust your eating plan to your individual calorie needs. Reading food labels   Check food labels for the amount of sodium per serving. Choose foods with less than 5 percent of the Daily Value of sodium. Generally, foods with less than 300 mg of sodium per serving fit into this eating plan.  To find whole grains, look for the word "whole" as the first word in the ingredient list. Shopping  Buy products labeled as "low-sodium" or "no salt added."  Buy fresh foods. Avoid canned foods and premade or frozen meals. Cooking  Avoid adding salt when cooking. Use salt-free seasonings or herbs instead of table salt or sea salt. Check with your health care provider or pharmacist before using salt  substitutes.  Do not fry foods. Cook foods using healthy methods such as baking, boiling, grilling, and broiling instead.  Cook with heart-healthy oils, such as olive, canola, soybean, or sunflower oil. Meal planning  Eat a balanced diet that includes: ? 5 or more servings of fruits and vegetables each day. At each meal, try to fill half of your plate with fruits and vegetables. ? Up to 6-8 servings of whole grains each day. ? Less than 6 oz of lean meat, poultry, or fish each day. A 3-oz serving of meat is about the same size as a deck of cards. One egg equals 1 oz. ? 2 servings of low-fat dairy each day. ? A serving of nuts, seeds, or beans 5 times each week. ? Heart-healthy fats. Healthy fats called Omega-3 fatty acids are found in foods such as flaxseeds and coldwater fish, like sardines, salmon, and mackerel.  Limit how much you eat of the following: ? Canned or prepackaged foods. ? Food that is high in trans fat, such as fried foods. ? Food that is high in saturated fat, such as fatty meat. ? Sweets, desserts, sugary drinks, and other foods with added sugar. ? Full-fat dairy products.  Do not salt foods before eating.  Try to eat at least 2 vegetarian meals each week.  Eat more home-cooked food and less restaurant, buffet, and fast food.  When eating at a restaurant, ask that your food be  prepared with less salt or no salt, if possible. What foods are recommended? The items listed may not be a complete list. Talk with your dietitian about what dietary choices are best for you. Grains Whole-grain or whole-wheat bread. Whole-grain or whole-wheat pasta. Bitton rice. [place]. Bulgur. Whole-grain and low-sodium cereals. Pita bread. Low-fat, low-sodium crackers. Whole-wheat flour tortillas. Vegetables Fresh or frozen vegetables (raw, steamed, roasted, or grilled). Low-sodium or reduced-sodium tomato and vegetable juice. Low-sodium or reduced-sodium tomato sauce and tomato  paste. Low-sodium or reduced-sodium canned vegetables. Fruits All fresh, dried, or frozen fruit. Canned fruit in natural juice (without added sugar). Meat and other protein foods Skinless chicken or [place]. Ground chicken or [place]. Pork with fat trimmed off. Fish and seafood. Egg whites. Dried beans, peas, or lentils. Unsalted nuts, nut butters, and seeds. Unsalted canned beans. Lean cuts of beef with fat trimmed off. Low-sodium, lean deli meat. Dairy Low-fat (1%) or fat-free (skim) milk. Fat-free, low-fat, or reduced-fat cheeses. Nonfat, low-sodium ricotta or cottage cheese. Low-fat or nonfat yogurt. Low-fat, low-sodium cheese. Fats and oils Soft margarine without trans fats. Vegetable oil. Low-fat, reduced-fat, or light mayonnaise and salad dressings (reduced-sodium). Canola, safflower, olive, soybean, and sunflower oils. Avocado. Seasoning and other foods Herbs. Spices. Seasoning mixes without salt. Unsalted popcorn and pretzels. Fat-free sweets. What foods are not recommended? The items listed may not be a complete list. Talk with your dietitian about what dietary choices are best for you. Grains Baked goods made with fat, such as croissants, muffins, or some breads. Dry pasta or rice meal packs. Vegetables Creamed or fried vegetables. Vegetables in a cheese sauce. Regular canned vegetables (not low-sodium or reduced-sodium). Regular canned tomato sauce and paste (not low-sodium or reduced-sodium). Regular tomato and vegetable juice (not low-sodium or reduced-sodium). Angie Fava. Olives. Fruits Canned fruit in a light or heavy syrup. Fried fruit. Fruit in cream or butter sauce. Meat and other protein foods Fatty cuts of meat. Ribs. Fried meat. Berniece Salines. Sausage. Bologna and other processed lunch meats. Salami. Fatback. Hotdogs. Bratwurst. Salted nuts and seeds. Canned beans with added salt. Canned or smoked fish. Whole eggs or egg yolks. Chicken or [place] with skin. Dairy Whole or 2% milk,  cream, and half-and-half. Whole or full-fat cream cheese. Whole-fat or sweetened yogurt. Full-fat cheese. Nondairy creamers. Whipped toppings. Processed cheese and cheese spreads. Fats and oils Butter. Stick margarine. Lard. Shortening. Ghee. Bacon fat. Tropical oils, such as coconut, palm kernel, or palm oil. Seasoning and other foods Salted popcorn and pretzels. Onion salt, garlic salt, seasoned salt, table salt, and sea salt. Worcestershire sauce. Tartar sauce. Barbecue sauce. Teriyaki sauce. Soy sauce, including reduced-sodium. Steak sauce. Canned and packaged gravies. Fish sauce. Oyster sauce. Cocktail sauce. Horseradish that you find on the shelf. Ketchup. Mustard. Meat flavorings and tenderizers. Bouillon cubes. Hot sauce and Tabasco sauce. Premade or packaged marinades. Premade or packaged taco seasonings. Relishes. Regular salad dressings. Where to find more information:  National Heart, Lung, and [place]: https://wilson-eaton.com/  American Heart Association: www.heart.org Summary  The DASH eating plan is a healthy eating plan that has been shown to reduce high blood pressure (hypertension). It may also reduce your risk for type 2 diabetes, heart disease, and stroke.  With the DASH eating plan, you should limit salt (sodium) intake to 2,300 mg a day. If you have hypertension, you may need to reduce your sodium intake to 1,500 mg a day.  When on the DASH eating plan, aim to eat more fresh fruits and vegetables, whole grains,  lean proteins, low-fat dairy, and heart-healthy fats.  Work with your health care provider or diet and nutrition specialist (dietitian) to adjust your eating plan to your individual calorie needs. This information is not intended to replace advice given to you by your health care provider. Make sure you discuss any questions you have with your health care provider. Document Revised: 07/04/2017 Document Reviewed: 07/15/2016 Elsevier Patient Education  [place].    Diabetes Mellitus and Nutrition, Adult When you have diabetes (diabetes mellitus), it is very important to have healthy eating habits because your blood sugar (glucose) levels are greatly affected by what you eat and drink. Eating healthy foods in the appropriate amounts, at about the same times every day, can help you:  Control your blood glucose.  Lower your risk of heart disease.  Improve your blood pressure.  Reach or maintain a healthy weight. Every person with diabetes is different, and each person has different needs for a meal plan. Your health care provider may recommend that you work with a diet and nutrition specialist (dietitian) to make a meal plan that is best for you. Your meal plan may vary depending on factors such as:  The calories you need.  The medicines you take.  Your weight.  Your blood glucose, blood pressure, and cholesterol levels.  Your activity level.  Other health conditions you have, such as heart or kidney disease. How do carbohydrates affect me? Carbohydrates, also called carbs, affect your blood glucose level more than any other type of food. Eating carbs naturally raises the amount of glucose in your blood. Carb counting is a method for keeping track of how many carbs you eat. Counting carbs is important to keep your blood glucose at a healthy level, especially if you use insulin or take certain oral diabetes medicines. It is important to know how many carbs you can safely have in each meal. This is different for every person. Your dietitian can help you calculate how many carbs you should have at each meal and for each snack. Foods that contain carbs include:  Bread, cereal, rice, pasta, and crackers.  Potatoes and corn.  Peas, beans, and lentils.  Milk and yogurt.  Fruit and juice.  Desserts, such as cakes, cookies, ice cream, and candy. How does alcohol affect me? Alcohol can cause a sudden decrease in blood glucose  (hypoglycemia), especially if you use insulin or take certain oral diabetes medicines. Hypoglycemia can be a life-threatening condition. Symptoms of hypoglycemia (sleepiness, dizziness, and confusion) are similar to symptoms of having too much alcohol. If your health care provider says that alcohol is safe for you, follow these guidelines:  Limit alcohol intake to no more than 1 drink per day for nonpregnant women and 2 drinks per day for men. One drink equals 12 oz of beer, 5 oz of wine, or 1 oz of hard liquor.  Do not drink on an empty stomach.  Keep yourself hydrated with water, diet soda, or unsweetened iced tea.  Keep in mind that regular soda, juice, and other mixers may contain a lot of sugar and must be counted as carbs. What are tips for following this plan?  Reading food labels  Start by checking the serving size on the "Nutrition Facts" label of packaged foods and drinks. The amount of calories, carbs, fats, and other nutrients listed on the label is based on one serving of the item. Many items contain more than one serving per package.  Check the total grams (  g) of carbs in one serving. You can calculate the number of servings of carbs in one serving by dividing the total carbs by 15. For example, if a food has 30 g of total carbs, it would be equal to 2 servings of carbs.  Check the number of grams (g) of saturated and trans fats in one serving. Choose foods that have low or no amount of these fats.  Check the number of milligrams (mg) of salt (sodium) in one serving. Most people should limit total sodium intake to less than 2,300 mg per day.  Always check the nutrition information of foods labeled as "low-fat" or "nonfat". These foods may be higher in added sugar or refined carbs and should be avoided.  Talk to your dietitian to identify your daily goals for nutrients listed on the label. Shopping  Avoid buying canned, premade, or processed foods. These foods tend to be high  in fat, sodium, and added sugar.  Shop around the outside edge of the grocery store. This includes fresh fruits and vegetables, bulk grains, fresh meats, and fresh dairy. Cooking  Use low-heat cooking methods, such as baking, instead of high-heat cooking methods like deep frying.  Cook using healthy oils, such as olive, canola, or sunflower oil.  Avoid cooking with butter, cream, or high-fat meats. Meal planning  Eat meals and snacks regularly, preferably at the same times every day. Avoid going long periods of time without eating.  Eat foods high in fiber, such as fresh fruits, vegetables, beans, and whole grains. Talk to your dietitian about how many servings of carbs you can eat at each meal.  Eat 4-6 ounces (oz) of lean protein each day, such as lean meat, chicken, fish, eggs, or tofu. One oz of lean protein is equal to: ? 1 oz of meat, chicken, or fish. ? 1 egg. ?  cup of tofu.  Eat some foods each day that contain healthy fats, such as avocado, nuts, seeds, and fish. Lifestyle  Check your blood glucose regularly.  Exercise regularly as told by your health care provider. This may include: ? 150 minutes of moderate-intensity or vigorous-intensity exercise each week. This could be brisk walking, biking, or water aerobics. ? Stretching and doing strength exercises, such as yoga or weightlifting, at least 2 times a week.  Take medicines as told by your health care provider.  Do not use any products that contain nicotine or tobacco, such as cigarettes and e-cigarettes. If you need help quitting, ask your health care provider.  Work with a Social worker or diabetes educator to identify strategies to manage stress and any emotional and social challenges. Questions to ask a health care provider  Do I need to meet with a diabetes educator?  Do I need to meet with a dietitian?  What number can I call if I have questions?  When are the best times to check my blood glucose? Where to  find more information:  American Diabetes Association: diabetes.org  Academy of Nutrition and Dietetics: www.eatright.[employer] of Diabetes and Digestive and Kidney Diseases (NIH): [employer].dk Summary  A healthy meal plan will help you control your blood glucose and maintain a healthy lifestyle.  Working with a diet and nutrition specialist (dietitian) can help you make a meal plan that is best for you.  Keep in mind that carbohydrates (carbs) and alcohol have immediate effects on your blood glucose levels. It is important to count carbs and to use alcohol carefully. This information is not intended  to replace advice given to you by your health care provider. Make sure you discuss any questions you have with your health care provider. Document Revised: 07/04/2017 Document Reviewed: 08/26/2016 Elsevier Patient Education  2020 [employer].

## 2020-05-10 NOTE — Telephone Encounter (Signed)
I called [facility], spoke with Dasia, and requested last diabetic eye exam, awaiting fax.

## 2020-05-11 ENCOUNTER — Encounter: Payer: Self-pay | Admitting: Nurse Practitioner

## 2020-05-11 DIAGNOSIS — E1165 Type 2 diabetes mellitus with hyperglycemia: Secondary | ICD-10-CM | POA: Insufficient documentation

## 2020-05-18 ENCOUNTER — Encounter: Payer: Self-pay | Admitting: Pulmonary Disease

## 2020-05-18 ENCOUNTER — Ambulatory Visit (INDEPENDENT_AMBULATORY_CARE_PROVIDER_SITE_OTHER): Payer: Medicare HMO | Admitting: Pulmonary Disease

## 2020-05-18 VITALS — BP 126/86 | HR 87 | Temp 97.3°F

## 2020-05-18 DIAGNOSIS — J4540 Moderate persistent asthma, uncomplicated: Secondary | ICD-10-CM

## 2020-05-18 MED ORDER — FLUTICASONE-SALMETEROL 250-50 MCG/DOSE IN AEPB: 2.0000 | INHALATION_SPRAY | Freq: Two times a day (BID) | RESPIRATORY_TRACT | 5 refills | Status: DC

## 2020-05-18 NOTE — Progress Notes (Signed)
Subjective:   PATIENT ID: Tammie [name] GENDER: female DOB: [birth year], MRN: [id number]   HPI  Chief Complaint  Patient presents with  . Follow-up    Asthma   Tammie [name] is a 59 year old female never smoker with asthma and history of cardiac arrest in 2015 who presents for follow-up.  She reports that her asthma is overall well controlled.  Since starting the [place] she reports her symptoms are well controlled. She is she is compliant with her Wixela 250-50 mcg 2 puffs twice a day.  She has shortness of breath on average once a week, usually in the mornings and [name] use albuterol with relief.  Denies wheezing or cough.  Denies any nocturnal symptoms which is improved from our last visit.  Symptoms are usually aggravated by trees, pollen and carpet exposure.  She has not had any exacerbations or need for prednisone for > 1 year.  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  Currently disability  Environmental exposures: Cleaning supplies  I have personally reviewed patient's past medical/family/social history, allergies and current medications.  Past Medical History:  Diagnosis Date  . 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 Anemia   . Anxiety   . Eczema   . Environmental allergies   . GERD (gastroesophageal reflux disease)    Patient denies  . Headache(784.0)   . Hypertension   . Moderate intermittent asthma   . Respiratory failure, acute ([place]) 04/19/2014   Related to potential Anaphylaxis ? presumably to ASA - exacerbated by Moderate to Severe Asthma       Outpatient Medications Prior to Visit  Medication Sig Dispense Refill  . acetaminophen (TYLENOL) 160 MG/5ML suspension Take by mouth every 6 (six) hours as needed.    Marland Kitchen albuterol (VENTOLIN HFA) 108 (90 Base) MCG/ACT inhaler INHALE 2 PUFFS INTO THE LUNGS EVERY 6 HOURS AS NEEDED FOR WHEEZING OR SHORTNESS OF BREATH 6.7 g 2  . amLODipine (NORVASC) 10 MG tablet  TAKE 1 TABLET BY MOUTH EVERY DAY FOR HIGH BLOOD PRESSURE 90 tablet 1  . bisoprolol (ZEBETA) 5 MG tablet TAKE 1/2 TABLET BY MOUTH EVERY DAY FOR HIGH BLOOD PRESSURE 45 tablet 1  . cetirizine (ZYRTEC) 10 MG tablet TAKE 1 TABLET(10 MG) BY MOUTH DAILY 30 tablet 5  . DUPIXENT 300 MG/2ML prefilled syringe RX#2 INJECT 300MG  SUBCUTANEOUSLY EVERY 14 DAYS STARTING ON DAY 15 4 mL 11  . EPINEPHrine 0.3 mg/0.3 mL IJ SOAJ injection     . fluticasone (FLONASE) 50 MCG/ACT nasal spray SHAKE LIQUID AND USE 2 SPRAYS IN EACH NOSTRIL DAILY 48 g 3  . hydrALAZINE (APRESOLINE) 25 MG tablet TAKE 1 TABLET BY MOUTH EVERY 8 HOURS 90 tablet 1  . Omega-3 Fatty Acids (FISH OIL) 500 MG CAPS Take 1 capsule by mouth daily.    Marland Kitchen triamcinolone ointment (KENALOG) 0.5 % Apply 1 application topically 2 (two) times daily. 120 g 3  . Fluticasone-Salmeterol (WIXELA INHUB) 250-50 MCG/DOSE AEPB Inhale 2 puffs into the lungs 2 (two) times daily. 120 each 5   Facility-Administered Medications Prior to Visit  Medication Dose Route Frequency Provider Last Rate Last Admin  . dupilumab (DUPIXENT) prefilled syringe 300 mg  300 mg Subcutaneous Q14 Days Valentina Shaggy, MD   300 mg at 05/10/20 1152    Review of Systems  Constitutional: Negative for chills, diaphoresis, fever, malaise/fatigue and weight loss.  HENT: Negative for congestion.   Respiratory: Positive for shortness  of breath. Negative for cough, hemoptysis, sputum production and wheezing.   Cardiovascular: Negative for chest pain, palpitations and leg swelling.     Objective:   Vitals:   05/18/20 1650  BP: 126/86  Pulse: 87  Temp: (!) 97.3 F (36.3 C)  SpO2: 99%      Physical Exam: General: Well-appearing, no acute distress HENT: [place], AT Eyes: EOMI, no scleral icterus Respiratory: Clear to auscultation bilaterally.  No crackles, wheezing or rales Cardiovascular: RRR, -M/R/G, no JVD Extremities:-Edema,-tenderness Neuro: AAO x4, CNII-XII grossly intact Psych:  Normal mood, normal affect  Data Reviewed:  Imaging: CXR 04/19/14 - Mildly enlarged cardiac silhouette. No infiltrate   PFT: Spirometry 06/22/19 FEV1/FVC 67% FVC 46% FEV1 40% Interpretation: Severe obstructive defect  Labs: CBC wit diff on 09/15/18 reviewed. Absolute eosinophil 462 (6.7%)  TTE: 03/17/18 EF 65-70%, no regional WMA, grade 1 diastolic dysfunction    Assessment & Plan:   Discussion: 59 year old never smoker with severe persistent asthma and probable aspirin exasperated respiratory disease who presents for follow-up.  Well-controlled on Dupixent and bronchodilators.  Discussed asthma action plan  Moderate-Severe Persistent Asthma - controlled Aspirin-exacerbated respiratory disease --CONTINUE Wixela 250-50 mcg TWO puffs TWICE a day.  Refill ordered --CONTINUE Albuterol as needed every 4 hours for shortness of breath or wheezing --CONTINUE follow-up with Allergist for Dupixent administration  Allergic Rhinitis --CONTINUE Flonase 1 spray per nostril --CONTINUE zyrtec 10 mg daily   Asthma Action Plan Increase Wixela to TWO puffs THREE times a day for worsening shortness of breath, wheezing and cough. If you symptoms do not improve in 24-48 hours, please our office for evaluation and/or prednisone taper.  No orders of the defined types were placed in this encounter.  Meds ordered this encounter  Medications  . Fluticasone-Salmeterol (WIXELA INHUB) 250-50 MCG/DOSE AEPB    Sig: Inhale 2 puffs into the lungs 2 (two) times daily.    Dispense:  120 each    Refill:  5    Return in about 4 months (around 09/18/2020).  I have spent a total time of 35-minutes on the day of the appointment reviewing prior documentation, coordinating care and discussing medical diagnosis and plan with the patient/family. Imaging, labs and tests included in this note have been reviewed and interpreted independently by me.  [place], MD [facility] 05/18/2020  3:51 PM  Office Number [phone]

## 2020-05-18 NOTE — Patient Instructions (Signed)
Moderate-Severe Persistent Asthma - controlled Aspirin-exacerbated respiratory disease --CONTINUE Wixela 250-50 mcg TWO puffs TWICE a day.  Refill ordered --CONTINUE Albuterol as needed every 4 hours for shortness of breath or wheezing --CONTINUE follow-up with Allergist for Dupixent administration  Allergic Rhinitis --CONTINUE Flonase 1 spray per nostril --CONTINUE zyrtec 10 mg daily   Asthma Action Plan Increase Wixela to TWO puffs THREE times a day for worsening shortness of breath, wheezing and cough. If you symptoms do not improve in 24-48 hours, please our office for evaluation and/or prednisone taper.

## 2020-05-24 ENCOUNTER — Ambulatory Visit (INDEPENDENT_AMBULATORY_CARE_PROVIDER_SITE_OTHER): Payer: Medicare HMO | Admitting: *Deleted

## 2020-05-24 ENCOUNTER — Other Ambulatory Visit: Payer: Self-pay

## 2020-05-24 DIAGNOSIS — J4550 Severe persistent asthma, uncomplicated: Secondary | ICD-10-CM | POA: Diagnosis not present

## 2020-06-06 ENCOUNTER — Other Ambulatory Visit: Payer: Self-pay | Admitting: Nurse Practitioner

## 2020-06-06 ENCOUNTER — Other Ambulatory Visit: Payer: Self-pay | Admitting: Allergy & Immunology

## 2020-06-06 ENCOUNTER — Telehealth: Payer: Self-pay | Admitting: *Deleted

## 2020-06-06 DIAGNOSIS — I10 Essential (primary) hypertension: Secondary | ICD-10-CM

## 2020-06-06 NOTE — Telephone Encounter (Signed)
Patient called stating that [facility] did not received form.  Paperwork was faxed and was scanned into patient's chart.  Printed off copy and refaxed to [place]

## 2020-06-06 NOTE — Telephone Encounter (Signed)
Patient has requested a refill on medication

## 2020-06-07 ENCOUNTER — Ambulatory Visit: Payer: Self-pay

## 2020-06-08 ENCOUNTER — Other Ambulatory Visit: Payer: Self-pay

## 2020-06-08 ENCOUNTER — Ambulatory Visit (INDEPENDENT_AMBULATORY_CARE_PROVIDER_SITE_OTHER): Payer: Medicare HMO

## 2020-06-08 DIAGNOSIS — J4550 Severe persistent asthma, uncomplicated: Secondary | ICD-10-CM | POA: Diagnosis not present

## 2020-06-22 ENCOUNTER — Other Ambulatory Visit: Payer: Self-pay

## 2020-06-22 ENCOUNTER — Ambulatory Visit (INDEPENDENT_AMBULATORY_CARE_PROVIDER_SITE_OTHER): Payer: Medicare HMO

## 2020-06-22 DIAGNOSIS — J4550 Severe persistent asthma, uncomplicated: Secondary | ICD-10-CM | POA: Diagnosis not present

## 2020-06-28 ENCOUNTER — Telehealth: Payer: Self-pay | Admitting: *Deleted

## 2020-06-28 NOTE — Telephone Encounter (Signed)
L/m for patient to contact office for MD appt for Dupixent reapproval ?

## 2020-07-07 ENCOUNTER — Other Ambulatory Visit: Payer: Self-pay | Admitting: Nurse Practitioner

## 2020-07-07 DIAGNOSIS — I10 Essential (primary) hypertension: Secondary | ICD-10-CM

## 2020-07-13 ENCOUNTER — Other Ambulatory Visit: Payer: Self-pay

## 2020-07-13 ENCOUNTER — Ambulatory Visit: Payer: Medicare HMO | Admitting: Allergy & Immunology

## 2020-07-13 ENCOUNTER — Ambulatory Visit (INDEPENDENT_AMBULATORY_CARE_PROVIDER_SITE_OTHER): Payer: Medicare HMO | Admitting: *Deleted

## 2020-07-13 DIAGNOSIS — J4550 Severe persistent asthma, uncomplicated: Secondary | ICD-10-CM | POA: Diagnosis not present

## 2020-07-24 ENCOUNTER — Other Ambulatory Visit: Payer: Self-pay | Admitting: Nurse Practitioner

## 2020-07-24 DIAGNOSIS — I10 Essential (primary) hypertension: Secondary | ICD-10-CM

## 2020-07-27 ENCOUNTER — Ambulatory Visit: Payer: Self-pay

## 2020-08-08 ENCOUNTER — Ambulatory Visit (INDEPENDENT_AMBULATORY_CARE_PROVIDER_SITE_OTHER): Payer: Medicare Other

## 2020-08-08 ENCOUNTER — Other Ambulatory Visit: Payer: Self-pay

## 2020-08-08 DIAGNOSIS — J4550 Severe persistent asthma, uncomplicated: Secondary | ICD-10-CM | POA: Diagnosis not present

## 2020-08-10 ENCOUNTER — Ambulatory Visit: Payer: Medicare HMO | Admitting: Allergy & Immunology

## 2020-08-10 ENCOUNTER — Telehealth: Payer: Self-pay

## 2020-08-10 NOTE — Telephone Encounter (Signed)
Called to see if patient wanted to reschedule their missed appointment. Left a message for patient to call office back to make another appointment.

## 2020-08-11 ENCOUNTER — Other Ambulatory Visit: Payer: Self-pay

## 2020-08-11 ENCOUNTER — Other Ambulatory Visit: Payer: Medicare Other

## 2020-08-11 ENCOUNTER — Encounter: Payer: Self-pay | Admitting: Nurse Practitioner

## 2020-08-11 ENCOUNTER — Ambulatory Visit (INDEPENDENT_AMBULATORY_CARE_PROVIDER_SITE_OTHER): Payer: Medicare Other | Admitting: Nurse Practitioner

## 2020-08-11 VITALS — BP 122/80 | HR 73 | Temp 96.9°F | Ht <= 58 in | Wt 147.0 lb

## 2020-08-11 DIAGNOSIS — K635 Polyp of colon: Secondary | ICD-10-CM

## 2020-08-11 DIAGNOSIS — K089 Disorder of teeth and supporting structures, unspecified: Secondary | ICD-10-CM

## 2020-08-11 DIAGNOSIS — E6609 Other obesity due to excess calories: Secondary | ICD-10-CM

## 2020-08-11 DIAGNOSIS — E0865 Diabetes mellitus due to underlying condition with hyperglycemia: Secondary | ICD-10-CM

## 2020-08-11 DIAGNOSIS — J4540 Moderate persistent asthma, uncomplicated: Secondary | ICD-10-CM

## 2020-08-11 DIAGNOSIS — I10 Essential (primary) hypertension: Secondary | ICD-10-CM

## 2020-08-11 DIAGNOSIS — Z6831 Body mass index (BMI) 31.0-31.9, adult: Secondary | ICD-10-CM

## 2020-08-11 DIAGNOSIS — J3089 Other allergic rhinitis: Secondary | ICD-10-CM | POA: Diagnosis not present

## 2020-08-11 DIAGNOSIS — E782 Mixed hyperlipidemia: Secondary | ICD-10-CM

## 2020-08-11 MED ORDER — CETIRIZINE HCL 10 MG PO TABS: ORAL_TABLET | ORAL | 11 refills | Status: DC

## 2020-08-11 MED ORDER — HYDRALAZINE HCL 25 MG PO TABS: 25.0000 mg | ORAL_TABLET | Freq: Three times a day (TID) | ORAL | 5 refills | Status: DC

## 2020-08-11 MED ORDER — FLUTICASONE PROPIONATE 50 MCG/ACT NA SUSP: NASAL | 5 refills | Status: DC

## 2020-08-11 MED ORDER — ALBUTEROL SULFATE HFA 108 (90 BASE) MCG/ACT IN AERS: INHALATION_SPRAY | RESPIRATORY_TRACT | 5 refills | Status: DC

## 2020-08-11 NOTE — Progress Notes (Signed)
Careteam: Patient Care Team: Tammie Chandler, NP as PCP - General (Geriatric Medicine) [name], MD as Consulting Physician (Ophthalmology)  PLACE OF SERVICE:  [facility] Directive information Does Patient Have a Medical Advance Directive?: No, Would patient like information on creating a medical advance directive?: Yes (MAU/Ambulatory/Procedural Areas - Information given)  Allergies  Allergen Reactions  . Ace Inhibitors Other (See Comments)    Angioedema  . Aspirin Anaphylaxis, Swelling and Other (See Comments)    Throat swelling   . Doxepin Anaphylaxis  . Ibuprofen Anaphylaxis, Swelling and Other (See Comments)    Throat swelling  . Nsaids Anaphylaxis, Swelling and Other (See Comments)  . Lipitor [Atorvastatin] Swelling and Other (See Comments)    Face, feet swell    Chief Complaint  Patient presents with  . Medical Management of Chronic Issues    3 month follow-up. Foot exam today. Discuss need for colonoscopy. Refill pended medications at [facility]      HPI: Patient is a 60 y.o. female for routine follow up.  Continues to try to cut back on sugar intake.  She is down to 1 pepsi from 3.  Drinking more water Eating more vegetables.  She has gone down to 2 meals a day. Does not like breakfast food. Cutting back on snacking.  Trying to do more walking.  Breathing continues to improve. Using albuterol a few times a week.  More weight she loses the better she feels.   Still has not gone to dentist, getting new insurance because she will likely need surgery.  Review of Systems:  Review of Systems  Constitutional: Negative for chills, fever and weight loss.  HENT: Positive for congestion.   Respiratory: Negative for cough, sputum production and shortness of breath.   Cardiovascular: Negative for chest pain, palpitations and leg swelling.  Gastrointestinal: Negative for abdominal pain, constipation, diarrhea and heartburn.  Genitourinary:  Negative for dysuria, frequency and urgency.  Musculoskeletal: Negative for back pain, joint pain and myalgias.  Skin: Negative.   Neurological: Negative for dizziness and headaches.  Psychiatric/Behavioral: Negative for depression. The patient is not nervous/anxious and does not have insomnia.     Past Medical History:  Diagnosis Date  . 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 Anemia   . Anxiety   . Eczema   . Environmental allergies   . GERD (gastroesophageal reflux disease)    Patient denies  . Headache(784.0)   . Hypertension   . Moderate intermittent asthma   . Respiratory failure, acute ([place]) 04/19/2014   Related to potential Anaphylaxis ? presumably to ASA - exacerbated by Moderate to Severe Asthma   Past Surgical History:  Procedure Laterality Date  . [place] SINUS SURGERY     Polyps  . TRANSTHORACIC ECHOCARDIOGRAM  04/2014   in setting of cardiopulmonary arrest): EF 65 to 70%.  Mild concentric LVH.  GR 1 DD.  Mild AI.;;  August 2019: EF 55 to 60%.  Mild LVH.  GR 1 DD.  Mild AI/MR.  Mildly increased PA pressures.   Social History:   reports that she has never smoked. She has never used smokeless tobacco. She reports that she does not drink alcohol and does not use drugs.  Family History  Problem Relation Age of Onset  . Heart attack Father   . Heart attack Mother   . Diabetes Sister   . High blood pressure Brother   .  Hypertension Brother   . Asthma Brother   . High blood pressure Sister   . Cancer Sister   . High blood pressure Son   . Cancer Maternal Grandmother   . Heart disease Paternal Grandmother   . Pancreatic cancer Paternal Grandmother   . Asthma Other        Great paternal grandfather   . Autism Son   . Asthma Other        Neice   . Colon cancer Neg Hx   . Esophageal cancer Neg Hx   . Rectal cancer Neg Hx   . Stomach cancer Neg Hx   . Allergic rhinitis Neg Hx   . Angioedema Neg Hx   . Eczema Neg  Hx   . Immunodeficiency Neg Hx   . Atopy Neg Hx     Medications: Patient's Medications  New Prescriptions   No medications on file  Previous Medications   ACETAMINOPHEN (TYLENOL) 160 MG/5ML SUSPENSION    Take by mouth every 6 (six) hours as needed.   ALBUTEROL (VENTOLIN HFA) 108 (90 BASE) MCG/ACT INHALER    INHALE 2 PUFFS INTO THE LUNGS EVERY 6 HOURS AS NEEDED FOR WHEEZING OR SHORTNESS OF BREATH   AMLODIPINE (NORVASC) 10 MG TABLET    TAKE 1 TABLET BY MOUTH EVERY DAY FOR HIGH BLOOD PRESSURE   BISOPROLOL (ZEBETA) 5 MG TABLET    TAKE 1/2 TABLET BY MOUTH EVERY DAY FOR HIGH BLOOD PRESSURE   CETIRIZINE (ZYRTEC) 10 MG TABLET    TAKE 1 TABLET(10 MG) BY MOUTH DAILY   DUPIXENT 300 MG/2ML PREFILLED SYRINGE    RX#2 INJECT $RemoveBef'300MG'hEmdwuNYZg$  SUBCUTANEOUSLY EVERY 14 DAYS STARTING ON DAY 15   EPINEPHRINE 0.3 MG/0.3 ML IJ SOAJ INJECTION       FLUTICASONE (FLONASE) 50 MCG/ACT NASAL SPRAY    SHAKE LIQUID AND USE 2 SPRAYS IN EACH NOSTRIL DAILY   FLUTICASONE-SALMETEROL (WIXELA INHUB) 250-50 MCG/DOSE AEPB    Inhale 2 puffs into the lungs 2 (two) times daily.   HYDRALAZINE (APRESOLINE) 25 MG TABLET    TAKE 1 TABLET BY MOUTH EVERY 8 HOURS   OMEGA-3 FATTY ACIDS (FISH OIL) 500 MG CAPS    Take 1 capsule by mouth daily.   TRIAMCINOLONE OINTMENT (KENALOG) 0.5 %    APPLY TOPICALLY TWICE DAILY  Modified Medications   No medications on file  Discontinued Medications   No medications on file    Physical Exam:  Vitals:   08/11/20 1002  BP: 122/80  Pulse: 73  Temp: (!) 96.9 F (36.1 C)  TempSrc: Temporal  SpO2: 97%  Weight: 147 lb (66.7 kg)  Height: $Remove'4\' 9"'PGUcOqg$  (1.448 m)   Body mass index is 31.81 kg/m. Wt Readings from Last 3 Encounters:  08/11/20 147 lb (66.7 kg)  05/10/20 152 lb (68.9 kg)  02/09/20 157 lb 3.2 oz (71.3 kg)    Physical Exam Constitutional:      General: She is not in acute distress.    Appearance: She is well-developed and well-nourished. She is not diaphoretic.  HENT:     Head: Normocephalic and  atraumatic.     Mouth/Throat:     Mouth: Oropharynx is clear and moist.  Eyes:     Conjunctiva/sclera: Conjunctivae normal.     Pupils: Pupils are equal, round, and reactive to light.  Cardiovascular:     Rate and Rhythm: Normal rate and regular rhythm.     Heart sounds: Normal heart sounds.  Pulmonary:     Effort: Pulmonary effort is normal.  Breath sounds: Normal breath sounds.  Abdominal:     General: Bowel sounds are normal.     Palpations: Abdomen is soft.  Musculoskeletal:        General: No tenderness or edema.     Cervical back: Normal range of motion and neck supple.  Skin:    General: Skin is warm and dry.  Neurological:     Mental Status: She is alert and oriented to person, place, and time.  Psychiatric:        Mood and Affect: Mood and affect and mood normal.        Behavior: Behavior normal.    Labs reviewed: Basic Metabolic Panel: Recent Labs    11/10/19 1019 02/09/20 1120  NA 143 141  K 4.0 3.4*  CL 105 105  CO2 31 24  GLUCOSE 120* 96  BUN 10 9  CREATININE 1.11* 1.01*  CALCIUM 9.8 9.6  TSH 1.96  --    Liver Function Tests: Recent Labs    11/10/19 1019 02/09/20 1120  AST 16 20  ALT 15 18  BILITOT 0.7 0.7  PROT 7.4 7.4   No results for input(s): LIPASE, AMYLASE in the last 8760 hours. No results for input(s): AMMONIA in the last 8760 hours. CBC: Recent Labs    11/10/19 1019 02/09/20 1120  WBC 6.8 6.4  NEUTROABS 3,278 3,117  HGB 13.5 13.8  HCT 40.5 42.4  MCV 81.8 82.0  PLT 315 291   Lipid Panel: Recent Labs    02/09/20 1120  CHOL 159  HDL 36*  LDLCALC 89  TRIG 245*  CHOLHDL 4.4   TSH: Recent Labs    11/10/19 1019  TSH 1.96   A1C: Lab Results  Component Value Date   HGBA1C 6.2 (H) 02/09/2020     Assessment/Plan 1. Moderate persistent asthma without complication -stable at this time - albuterol (VENTOLIN HFA) 108 (90 Base) MCG/ACT inhaler; INHALE 2 PUFFS INTO THE LUNGS EVERY 6 HOURS AS NEEDED FOR WHEEZING OR  SHORTNESS OF BREATH  Dispense: 6.7 g; Refill: 5   2. Non-seasonal allergic rhinitis, unspecified trigger -improved symptoms on injections continues on flonase and zyrtec as well.  - cetirizine (ZYRTEC) 10 MG tablet; TAKE 1 TABLET(10 MG) BY MOUTH DAILY  Dispense: 30 tablet; Refill: 11 - fluticasone (FLONASE) 50 MCG/ACT nasal spray; SHAKE LIQUID AND USE 2 SPRAYS IN EACH NOSTRIL DAILY  Dispense: 48 g; Refill: 5  3. Essential hypertension Well controlled on norvasc, bisoprolol, hyralazine with dietary modifications.  - hydrALAZINE (APRESOLINE) 25 MG tablet; Take 1 tablet (25 mg total) by mouth every 8 (eight) hours.  Dispense: 90 tablet; Refill: 5 - CMP with eGFR([facility]) - CBC with Differential/Platelet  4. Diabetes mellitus due to underlying condition with hyperglycemia, without long-term current use of insulin (HCC) -diet controlled, continues to limit sweets. Encouraged to continue dietary compliance, routine foot care/monitoring and to keep up with diabetic eye exams through ophthalmology  -continue exercise. - Hemoglobin A1c  5. Poor dentition Continues to look for dentist and reports dealing with insurance.   6. Mixed hyperlipidemia -continue to work on dietary modification. Facial swelling with statin. - Lipid Panel - CMP with eGFR([facility])  7. Polyp of colon, unspecified part of colon, unspecified type - Ambulatory referral to Gastroenterology  8. Class 1 obesity due to excess calories with serious comorbidity and body mass index (BMI) of 31.0 to 31.9 in adult -healthy weight loss discussed and encouraged though physical activity/exericse and diet   Next appt: 6 months.  Tammie [name]. [place], [facility] [phone]

## 2020-08-11 NOTE — Patient Instructions (Signed)
Continue to work on reducing sugar in your diet and increasing physical activity    Diabetes Mellitus and Nutrition, Adult When you have diabetes (diabetes mellitus), it is very important to have healthy eating habits because your blood sugar (glucose) levels are greatly affected by what you eat and drink. Eating healthy foods in the appropriate amounts, at about the same times every day, can help you:  Control your blood glucose.  Lower your risk of heart disease.  Improve your blood pressure.  Reach or maintain a healthy weight. Every person with diabetes is different, and each person has different needs for a meal plan. Your health care provider may recommend that you work with a diet and nutrition specialist (dietitian) to make a meal plan that is best for you. Your meal plan may vary depending on factors such as:  The calories you need.  The medicines you take.  Your weight.  Your blood glucose, blood pressure, and cholesterol levels.  Your activity level.  Other health conditions you have, such as heart or kidney disease. How do carbohydrates affect me? Carbohydrates, also called carbs, affect your blood glucose level more than any other type of food. Eating carbs naturally raises the amount of glucose in your blood. Carb counting is a method for keeping track of how many carbs you eat. Counting carbs is important to keep your blood glucose at a healthy level, especially if you use insulin or take certain oral diabetes medicines. It is important to know how many carbs you can safely have in each meal. This is different for every person. Your dietitian can help you calculate how many carbs you should have at each meal and for each snack. Foods that contain carbs include:  Bread, cereal, rice, pasta, and crackers.  Potatoes and corn.  Peas, beans, and lentils.  Milk and yogurt.  Fruit and juice.  Desserts, such as cakes, cookies, ice cream, and candy. How does alcohol  affect me? Alcohol can cause a sudden decrease in blood glucose (hypoglycemia), especially if you use insulin or take certain oral diabetes medicines. Hypoglycemia can be a life-threatening condition. Symptoms of hypoglycemia (sleepiness, dizziness, and confusion) are similar to symptoms of having too much alcohol. If your health care provider says that alcohol is safe for you, follow these guidelines:  Limit alcohol intake to no more than 1 drink per day for nonpregnant women and 2 drinks per day for men. One drink equals 12 oz of beer, 5 oz of wine, or 1 oz of hard liquor.  Do not drink on an empty stomach.  Keep yourself hydrated with water, diet soda, or unsweetened iced tea.  Keep in mind that regular soda, juice, and other mixers may contain a lot of sugar and must be counted as carbs. What are tips for following this plan?  Reading food labels  Start by checking the serving size on the "Nutrition Facts" label of packaged foods and drinks. The amount of calories, carbs, fats, and other nutrients listed on the label is based on one serving of the item. Many items contain more than one serving per package.  Check the total grams (g) of carbs in one serving. You can calculate the number of servings of carbs in one serving by dividing the total carbs by 15. For example, if a food has 30 g of total carbs, it would be equal to 2 servings of carbs.  Check the number of grams (g) of saturated and trans fats in one serving.  Choose foods that have low or no amount of these fats.  Check the number of milligrams (mg) of salt (sodium) in one serving. Most people should limit total sodium intake to less than 2,300 mg per day.  Always check the nutrition information of foods labeled as "low-fat" or "nonfat". These foods may be higher in added sugar or refined carbs and should be avoided.  Talk to your dietitian to identify your daily goals for nutrients listed on the label. Shopping  Avoid buying  canned, premade, or processed foods. These foods tend to be high in fat, sodium, and added sugar.  Shop around the outside edge of the grocery store. This includes fresh fruits and vegetables, bulk grains, fresh meats, and fresh dairy. Cooking  Use low-heat cooking methods, such as baking, instead of high-heat cooking methods like deep frying.  Cook using healthy oils, such as olive, canola, or sunflower oil.  Avoid cooking with butter, cream, or high-fat meats. Meal planning  Eat meals and snacks regularly, preferably at the same times every day. Avoid going long periods of time without eating.  Eat foods high in fiber, such as fresh fruits, vegetables, beans, and whole grains. Talk to your dietitian about how many servings of carbs you can eat at each meal.  Eat 4-6 ounces (oz) of lean protein each day, such as lean meat, chicken, fish, eggs, or tofu. One oz of lean protein is equal to: ? 1 oz of meat, chicken, or fish. ? 1 egg. ?  cup of tofu.  Eat some foods each day that contain healthy fats, such as avocado, nuts, seeds, and fish. Lifestyle  Check your blood glucose regularly.  Exercise regularly as told by your health care provider. This may include: ? 150 minutes of moderate-intensity or vigorous-intensity exercise each week. This could be brisk walking, biking, or water aerobics. ? Stretching and doing strength exercises, such as yoga or weightlifting, at least 2 times a week.  Take medicines as told by your health care provider.  Do not use any products that contain nicotine or tobacco, such as cigarettes and e-cigarettes. If you need help quitting, ask your health care provider.  Work with a Social worker or diabetes educator to identify strategies to manage stress and any emotional and social challenges. Questions to ask a health care provider  Do I need to meet with a diabetes educator?  Do I need to meet with a dietitian?  What number can I call if I have  questions?  When are the best times to check my blood glucose? Where to find more information:  American Diabetes Association: diabetes.org  Academy of Nutrition and Dietetics: www.eatright.[employer] of Diabetes and Digestive and Kidney Diseases (NIH): [employer] Summary  A healthy meal plan will help you control your blood glucose and maintain a healthy lifestyle.  Working with a diet and nutrition specialist (dietitian) can help you make a meal plan that is best for you.  Keep in mind that carbohydrates (carbs) and alcohol have immediate effects on your blood glucose levels. It is important to count carbs and to use alcohol carefully. This information is not intended to replace advice given to you by your health care provider. Make sure you discuss any questions you have with your health care provider. Document Revised: 07/04/2017 Document Reviewed: 08/26/2016 Elsevier Patient Education  2020 [employer].

## 2020-08-12 LAB — HEMOGLOBIN A1C
Hgb A1c MFr Bld: 6 % of total Hgb — ABNORMAL HIGH (ref <5.7)
Mean Plasma Glucose: 126 mg/dL
eAG (mmol/L): 7 mmol/L

## 2020-08-12 LAB — LIPID PANEL
Cholesterol: 174 mg/dL (ref <200)
HDL: 43 mg/dL — ABNORMAL LOW (ref >=50)
LDL Cholesterol (Calc): 101 mg/dL (calc) — ABNORMAL HIGH
Non-HDL Cholesterol (Calc): 131 mg/dL (calc) — ABNORMAL HIGH (ref <130)
Total CHOL/HDL Ratio: 4 (calc) (ref <5.0)
Triglycerides: 180 mg/dL — ABNORMAL HIGH (ref <150)

## 2020-08-12 LAB — COMPLETE METABOLIC PANEL WITH GFR
AG Ratio: 1.3 (calc) (ref 1.0–2.5)
ALT: 21 U/L (ref 6–29)
AST: 19 U/L (ref 10–35)
Albumin: 4 g/dL (ref 3.6–5.1)
Alkaline phosphatase (APISO): 101 U/L (ref 37–153)
BUN: 9 mg/dL (ref 7–25)
CO2: 28 mmol/L (ref 20–32)
Calcium: 9.5 mg/dL (ref 8.6–10.4)
Chloride: 107 mmol/L (ref 98–110)
Creat: 0.89 mg/dL (ref 0.50–0.99)
GFR, Est African American: 81 mL/min/{1.73_m2} (ref >=60)
GFR, Est Non African American: 70 mL/min/{1.73_m2} (ref >=60)
Globulin: 3.2 g/dL (calc) (ref 1.9–3.7)
Glucose, Bld: 94 mg/dL (ref 65–99)
Potassium: 3.9 mmol/L (ref 3.5–5.3)
Sodium: 143 mmol/L (ref 135–146)
Total Bilirubin: 0.8 mg/dL (ref 0.2–1.2)
Total Protein: 7.2 g/dL (ref 6.1–8.1)

## 2020-08-12 LAB — CBC WITH DIFFERENTIAL/PLATELET
Absolute Monocytes: 537 cells/uL (ref 200–950)
Basophils Absolute: 59 cells/uL (ref 0–200)
Basophils Relative: 1 %
Eosinophils Absolute: 378 cells/uL (ref 15–500)
Eosinophils Relative: 6.4 %
HCT: 42 % (ref 35.0–45.0)
Hemoglobin: 13.8 g/dL (ref 11.7–15.5)
Lymphs Abs: 1988 cells/uL (ref 850–3900)
MCH: 27.3 pg (ref 27.0–33.0)
MCHC: 32.9 g/dL (ref 32.0–36.0)
MCV: 83 fL (ref 80.0–100.0)
MPV: 10.2 fL (ref 7.5–12.5)
Monocytes Relative: 9.1 %
Neutro Abs: 2938 cells/uL (ref 1500–7800)
Neutrophils Relative %: 49.8 %
Platelets: 327 10*3/uL (ref 140–400)
RBC: 5.06 10*6/uL (ref 3.80–5.10)
RDW: 13.9 % (ref 11.0–15.0)
Total Lymphocyte: 33.7 %
WBC: 5.9 10*3/uL (ref 3.8–10.8)

## 2020-08-17 ENCOUNTER — Ambulatory Visit (INDEPENDENT_AMBULATORY_CARE_PROVIDER_SITE_OTHER): Payer: Medicare Other | Admitting: Allergy & Immunology

## 2020-08-17 ENCOUNTER — Encounter: Payer: Self-pay | Admitting: Allergy & Immunology

## 2020-08-17 ENCOUNTER — Telehealth: Payer: Self-pay

## 2020-08-17 ENCOUNTER — Other Ambulatory Visit: Payer: Self-pay

## 2020-08-17 VITALS — BP 140/82 | HR 80 | Temp 98.5°F | Ht <= 58 in | Wt 156.2 lb

## 2020-08-17 DIAGNOSIS — J310 Chronic rhinitis: Secondary | ICD-10-CM

## 2020-08-17 DIAGNOSIS — J339 Nasal polyp, unspecified: Secondary | ICD-10-CM | POA: Diagnosis not present

## 2020-08-17 DIAGNOSIS — J4550 Severe persistent asthma, uncomplicated: Secondary | ICD-10-CM | POA: Diagnosis not present

## 2020-08-17 DIAGNOSIS — L309 Dermatitis, unspecified: Secondary | ICD-10-CM

## 2020-08-17 DIAGNOSIS — R0602 Shortness of breath: Secondary | ICD-10-CM

## 2020-08-17 DIAGNOSIS — J984 Other disorders of lung: Secondary | ICD-10-CM | POA: Diagnosis not present

## 2020-08-17 MED ORDER — EZETIMIBE 10 MG PO TABS
10.0000 mg | ORAL_TABLET | Freq: Every day | ORAL | 5 refills | Status: DC
Start: 2020-08-17 — End: 2020-11-13

## 2020-08-17 NOTE — Progress Notes (Signed)
FOLLOW UP  Date of Service/Encounter:  08/17/20   Assessment:   Severe persistent asthma with restrictive spirometry - worsening and obtaining chest CT today  Chronic rhinitis (grasses, molds)  Disorder of respiratory system exacerbated by aspirin  Nasal polyposis (L>R) - markedly improved on Dupixent  Vaccinated against COVID-19, including booster   Plan/Recommendations:   1. Moderate persistent asthma, uncomplicated - Lung testing looked low today, and it was even slightly lower than the last time we saw you.  - We are going to get a chest CT to get good images of your lungs.  - We are otherwise not going to make any medication changes at this time. - Daily controller medication(s): Wixela 250/50 mcg one puff twice daily + Dupixent every two weeks - Prior to physical activity: albuterol 2 puffs 10-15 minutes before physical activity. - Rescue medications: albuterol 4 puffs every 4-6 hours as needed - Asthma control goals:  * Full participation in all desired activities (may need albuterol before activity) * Albuterol use two time or less a week on average (not counting use with activity) * Cough interfering with sleep two time or less a month * Oral steroids no more than once a year * No hospitalizations  2. Chronic rhinitis with nasal polyps - Continue with fluticasone two sprays per nostril twice daily. - Continue with cetirizine 10mg  daily.  - Continue with Dupixent every two weeks.   3. Eczema versus contact dermatitis  - Continue with Dupixent every two weeks.  - Continue with your topical steroid to see if this helps with the rash.   4. Return in about 3 months (around 11/15/2020).    Subjective:   Tammie [name] is a 60 y.o. female presenting today for follow up of  Chief Complaint  Patient presents with  . Asthma    Tammie [name] has a history of the following: Patient Active Problem List   Diagnosis Date Noted  . Type 2 diabetes mellitus with  hyperglycemia ([place]) 05/11/2020  . Metabolic syndrome 86/76/7209  . Chest pain at rest 11/29/2019  . Mixed hyperlipidemia 11/10/2019  . Hyperglycemia 11/10/2019  . Dermatitis 06/22/2019  . Chronic rhinitis 04/06/2019  . Disorder of respiratory system exacerbated by aspirin 04/06/2019  . Nasal polyposis 04/06/2019  . Moderate persistent asthma without complication 47/04/6282  . Generalized anxiety disorder 09/25/2016  . Chronic seasonal allergic rhinitis 09/25/2016  . Aortic valve regurgitation 09/25/2016  . Other dysphagia 04/26/2014  . Hypertension 04/18/2014    History obtained from: chart review and patient.  Tammie [name] is a 60 y.o. female presenting for a follow up visit.  She was last seen in November 2020.  At that time, Dr. Maudie Mercury continued her on [place].  She was also continued on Wixela 250 mcg 1 puff twice daily.  At the time, she had not felt any improvement with Dupixent.  She continued it nonetheless.  For her rhinitis, she was continued on cetirizine as well as Flonase.  She was continued on triamcinolone as needed for flares of her eczema.  Since the last visit, she has done well.   Asthma/Respiratory Symptom History: She has been doing well on the Dupixent every two weeks. She has not had any problems with the injections themselves. She remains on the Wixela one puff twice daily. She has not needed antibiotics or steroids for her breathing in quite some time. She is using her emergency inhaler only as needed. She has not needed prednisone at all.  She does have trouble  sleeping at night, but this is mostly related to the pain.  She was around a lot of chemical when ues when she worked which have improved since she retired.  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 She had significant breathing issues when she worked, but since being out of work and on disability her breathing status has improved.  She has never had a chest CT to her  knowledge.  She has never seen pulmonology.  She does have a chest CT in the system from September 2015 when she was intubated for a prolonged period of time.  It was negative for a pulmonary embolism.  There is mild atelectasis in the lower lungs.  Allergic Rhinitis Symptom History: She remains on cetirizine and Flonase.  She has not needed antibiotics in quite some time.  Overall, she feels that her allergic rhinitis is well controlled.  Her nasal polyps have improved.  At one point, they are protruding out of her nose.  She did get her sense of smell back within a month of starting Dupixent and her congestion has been markedly improved over the years or so since we started.  Eczema Symptom History: Her skin is dry.  She does moisturize a couple of times a day.  She does use the triamcinolone very rarely.  She has not needed systemic steroids and has not needed antibiotics for her skin.  The worst areas on her arms.  Otherwise, there have been no changes to her past medical history, surgical history, family history, or social history.    Review of Systems  Constitutional: Negative.  Negative for chills, fever, malaise/fatigue and weight loss.  HENT: Positive for congestion. Negative for ear discharge, ear pain and sinus pain.   Eyes: Negative for pain, discharge and redness.  Respiratory: Negative for cough, sputum production, shortness of breath and wheezing.   Cardiovascular: Negative.  Negative for chest pain and palpitations.  Gastrointestinal: Negative for abdominal pain, constipation, diarrhea, heartburn, nausea and vomiting.  Skin: Negative.  Negative for itching and rash.  Neurological: Negative for dizziness and headaches.  Endo/Heme/Allergies: Positive for environmental allergies. Does not bruise/bleed easily.       Objective:   Blood pressure 140/82, pulse 80, temperature 98.5 F (36.9 C), temperature source Temporal, height 4\' 9"  (1.448 m), weight 156 lb 3.2 oz (70.9 kg),  SpO2 95 %. Body mass index is 33.8 kg/m.   Physical Exam:  Physical Exam Constitutional:      Appearance: She is well-developed.     Comments: Pleasant female.  Very talkative.  HENT:     Head: Normocephalic and atraumatic.     Right Ear: Tympanic membrane, ear canal and external ear normal.     Left Ear: Tympanic membrane, ear canal and external ear normal.     Nose: No nasal deformity, septal deviation, mucosal edema, rhinorrhea or epistaxis.     Right Turbinates: Enlarged and swollen.     Left Turbinates: Enlarged and swollen.     Right Sinus: No maxillary sinus tenderness or frontal sinus tenderness.     Left Sinus: No maxillary sinus tenderness or frontal sinus tenderness.     Comments: Erythematous turbinates bilaterally.  There is some dried rhinorrhea. Nasal polyps still present on the right obstructing around 25% of the nasal passage.  Nasal polyps not even seen on the left.    Mouth/Throat:     Mouth: Oropharynx is clear and moist. Mucous membranes are not pale and  not dry.     Pharynx: Uvula midline.     Comments: Cobblestoning present in the posterior oropharynx. Eyes:     General:        Right eye: No discharge.        Left eye: No discharge.     Extraocular Movements: EOM normal.     Conjunctiva/sclera: Conjunctivae normal.     Right eye: Right conjunctiva is not injected. No chemosis.    Left eye: Left conjunctiva is not injected. No chemosis.    Pupils: Pupils are equal, round, and reactive to light.  Cardiovascular:     Rate and Rhythm: Normal rate and regular rhythm.     Heart sounds: Normal heart sounds.  Pulmonary:     Effort: Pulmonary effort is normal. No tachypnea, accessory muscle usage or respiratory distress.     Breath sounds: Normal breath sounds. No wheezing, rhonchi or rales.     Comments: Moving air well in all lung fields.  No increased work of breathing. No wheezing or crackles noted.  Somewhat decreased air movement at the bases. Chest:      Chest wall: No tenderness.  Lymphadenopathy:     Cervical: No cervical adenopathy.  Skin:    General: Skin is warm.     Capillary Refill: Capillary refill takes less than 2 seconds.     Coloration: Skin is not pale.     Findings: No abrasion, erythema, petechiae or rash. Rash is not papular, urticarial or vesicular.     Comments: No eczematous or urticarial lesions noted.  Neurological:     Mental Status: She is alert.  Psychiatric:        Mood and Affect: Mood and affect normal.        Behavior: Behavior is cooperative.      Diagnostic studies:    Spirometry: results abnormal (FEV1: 0.45/32%, FVC: 0.57/34%, FEV1/FVC: 79%).    Spirometry consistent with possible restrictive disease.   Allergy Studies: none      Salvatore Marvel, MD  [facility] [place]

## 2020-08-17 NOTE — Patient Instructions (Addendum)
1. Moderate persistent asthma, uncomplicated - Lung testing looked low today, and it was even slightly lower than the last time we saw you.  - We are going to get a chest CT to get good images of your lungs.  - We are otherwise not going to make any medication changes at this time. - Daily controller medication(s): Wixela 250/50 mcg one puff twice daily + Dupixent every two weeks - Prior to physical activity: albuterol 2 puffs 10-15 minutes before physical activity. - Rescue medications: albuterol 4 puffs every 4-6 hours as needed - Asthma control goals:  * Full participation in all desired activities (may need albuterol before activity) * Albuterol use two time or less a week on average (not counting use with activity) * Cough interfering with sleep two time or less a month * Oral steroids no more than once a year * No hospitalizations  2. Chronic rhinitis with nasal polyps - Continue with fluticasone two sprays per nostril twice daily. - Continue with cetirizine 10mg  daily.  - Continue with Dupixent every two weeks.   3. Eczema versus contact dermatitis  - Continue with Dupixent every two weeks.  - Continue with your topical steroid to see if this helps with the rash.   4. Return in about 3 months (around 11/15/2020).    Please inform us of any Emergency Department visits, hospitalizations, or changes in symptoms. Call us before going to the ED for breathing or allergy symptoms since we might be able to fit you in for a sick visit. Feel free to contact us anytime with any questions, problems, or concerns.  It was a pleasure to see you again today!  Websites that have reliable patient information: 1. American Academy of Asthma, Allergy, and Immunology: www.aaaai.org 2. Food Allergy Research and Education (FARE): foodallergy.org 3. Mothers of Asthmatics: http://www.asthmacommunitynetwork.org 4. American College of Allergy, Asthma, and Immunology: www.acaai.org   COVID-19 Vaccine  Information can be found at: ShippingScam.co.uk For questions related to vaccine distribution or appointments, please email [email or web address] or call [phone].     "Like" [place] on Facebook and Instagram for our latest updates!       Make sure you are registered to vote! If you have moved or changed any of your contact information, you will need to get this updated before voting!  In some cases, you MAY be able to register to vote online: [email or web address]

## 2020-08-17 NOTE — Telephone Encounter (Signed)
Discussed results with patient, patient verbalized understanding of results  Copy of labs mailed to patient  RX sent to pharmacy for Zetia

## 2020-08-17 NOTE — Telephone Encounter (Signed)
-----   Message from [name], NP sent at 08/14/2020  9:23 AM EST ----- Hdl health cholesterol is still low but has gone up since last check 6 month ago which is good- to continue exercise Triglycerides are still elevated but have improved  LDL (bad)cholesterol is worse, goal less than 70 at 101.  Would recommend starting zetia 10 mg by mouth daily since she has allergy to statin  Kidney function, electrolytes, liver function and blood counts normal.   A1c (avg blood sugar) continues to trend now which is great.  Continue dietary modifications with cutting back on sweet, sugar, carbohydrates and fats with increase physical activity

## 2020-08-18 ENCOUNTER — Telehealth: Payer: Self-pay | Admitting: *Deleted

## 2020-08-18 NOTE — Telephone Encounter (Signed)
-----   Message from Valentina Shaggy, MD sent at 08/17/2020  4:16 PM EST ----- Chest CT ordered.

## 2020-08-18 NOTE — Telephone Encounter (Signed)
Called and spoke with medicare to see if a PA was needed. There was not a PA needed for her CT scan of her chest. An appointment has already been made with the patient with [facility] for the Chest CT. Case number [id number].

## 2020-08-22 ENCOUNTER — Ambulatory Visit (INDEPENDENT_AMBULATORY_CARE_PROVIDER_SITE_OTHER): Payer: Medicare Other

## 2020-08-22 ENCOUNTER — Telehealth: Payer: Self-pay | Admitting: *Deleted

## 2020-08-22 ENCOUNTER — Other Ambulatory Visit: Payer: Self-pay

## 2020-08-22 DIAGNOSIS — J4550 Severe persistent asthma, uncomplicated: Secondary | ICD-10-CM | POA: Diagnosis not present

## 2020-08-22 NOTE — Telephone Encounter (Signed)
That is no good - let's do Nucala monthly to see how that works.  Salvatore Marvel, MD [facility] [place]

## 2020-08-22 NOTE — Telephone Encounter (Signed)
Tried to obtain re-approval for Dupixent for patients asthma. Per her [facility] medicare plan advised they have denied same since she has to do step therapy of Nucala or Fasenra in order to be considered for [place]. Please advise

## 2020-08-25 NOTE — Telephone Encounter (Signed)
Called patient and advised Ins denied Dupixent until step therapy of Fasenra or Nucala tried. Per Dr gallagher we are going to try Nucala every 28 days and Rx to [facility]

## 2020-08-31 ENCOUNTER — Ambulatory Visit
Admission: RE | Admit: 2020-08-31 | Discharge: 2020-08-31 | Disposition: A | Discharge status: Home / Self Care | Payer: Medicare Other | Source: Ambulatory Visit | Attending: Allergy & Immunology | Admitting: Allergy & Immunology

## 2020-08-31 ENCOUNTER — Other Ambulatory Visit: Payer: Self-pay

## 2020-08-31 DIAGNOSIS — R0602 Shortness of breath: Secondary | ICD-10-CM

## 2020-09-05 ENCOUNTER — Other Ambulatory Visit: Payer: Self-pay

## 2020-09-05 ENCOUNTER — Ambulatory Visit (INDEPENDENT_AMBULATORY_CARE_PROVIDER_SITE_OTHER): Payer: Medicare Other

## 2020-09-05 DIAGNOSIS — J4550 Severe persistent asthma, uncomplicated: Secondary | ICD-10-CM | POA: Diagnosis not present

## 2020-09-15 ENCOUNTER — Ambulatory Visit: Payer: Self-pay

## 2020-09-18 ENCOUNTER — Ambulatory Visit (INDEPENDENT_AMBULATORY_CARE_PROVIDER_SITE_OTHER): Payer: Medicare Other | Admitting: *Deleted

## 2020-09-18 ENCOUNTER — Other Ambulatory Visit: Payer: Self-pay

## 2020-09-18 DIAGNOSIS — J4550 Severe persistent asthma, uncomplicated: Secondary | ICD-10-CM

## 2020-09-18 MED ORDER — EPINEPHRINE 0.3 MG/0.3ML IJ SOAJ
0.3000 mg | Freq: Once | INTRAMUSCULAR | 1 refills | Status: AC
Start: 2020-09-18 — End: 2020-09-18

## 2020-09-18 MED ORDER — MEPOLIZUMAB 100 MG ~~LOC~~ SOLR
100.0000 mg | SUBCUTANEOUS | Status: DC
  Administered 2020-09-18 – 2020-11-13 (×3): 100 mg via SUBCUTANEOUS

## 2020-09-18 NOTE — Progress Notes (Signed)
Immunotherapy   Patient Details  Name: Tammie [name] MRN: [id number] Date of Birth: 09/07/1960  09/18/2020  Tammie [name] started injections for  Nucala   Frequency: Every 28 days  Epi-Pen:Epi-Pen Available  Consent signed and patient instructions given. Patient started Nucala today to replace her Dupixent and received 22mL in the [facility]. Patient waited 30 minutes in office and did not experience any issues.    Ashleigh Fernandez-Vernon 09/18/2020, 10:21 AM

## 2020-09-20 ENCOUNTER — Encounter: Payer: Self-pay | Admitting: Gastroenterology

## 2020-09-22 ENCOUNTER — Encounter: Payer: Self-pay | Admitting: Gastroenterology

## 2020-09-26 NOTE — Progress Notes (Signed)
Attempted to call patient. No answer. Left VM. Will discuss and review results together in clinic. We can plan for ambulatory O2 at next visit.

## 2020-10-03 ENCOUNTER — Other Ambulatory Visit: Payer: Self-pay

## 2020-10-03 ENCOUNTER — Encounter: Payer: Self-pay | Admitting: Pulmonary Disease

## 2020-10-03 ENCOUNTER — Ambulatory Visit (INDEPENDENT_AMBULATORY_CARE_PROVIDER_SITE_OTHER): Payer: Medicare Other | Admitting: Pulmonary Disease

## 2020-10-03 VITALS — BP 124/68 | HR 93 | Temp 98.7°F | Ht <= 58 in | Wt 151.5 lb

## 2020-10-03 DIAGNOSIS — J4550 Severe persistent asthma, uncomplicated: Secondary | ICD-10-CM | POA: Diagnosis not present

## 2020-10-03 DIAGNOSIS — J4540 Moderate persistent asthma, uncomplicated: Secondary | ICD-10-CM | POA: Diagnosis not present

## 2020-10-03 MED ORDER — FLUTICASONE-SALMETEROL 250-50 MCG/DOSE IN AEPB: 2.0000 | INHALATION_SPRAY | Freq: Two times a day (BID) | RESPIRATORY_TRACT | 6 refills | Status: DC

## 2020-10-03 MED ORDER — ALBUTEROL SULFATE HFA 108 (90 BASE) MCG/ACT IN AERS: INHALATION_SPRAY | RESPIRATORY_TRACT | 6 refills | Status: DC

## 2020-10-03 NOTE — Progress Notes (Signed)
Subjective:   PATIENT ID: Tammie [name] GENDER: female DOB: 30-Aug-1960, MRN: [id number]   HPI  Chief Complaint  Patient presents with  . Follow-up    Pt stated that her asthma control has been good.    Tammie [name] is a 60 year old female never smoker with asthma and history of cardiac arrest in 2015 who presents for follow-up.  She reports her asthma is well controlled. She has transitioned from [place] to [place] due to insurance. She is compliant with Wixela 250-50 mcg 2 puffs twice a day. She uses her rescue albuterol once a week when she feels short of breath. Rare episode of wheezing and cough. Aggravated by cold weather, strong odors, tree and pollen. Denies any exacerbations or need for prednisone for >1 year.  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  Currently disability  Environmental exposures: Cleaning supplies  I have personally reviewed patient's past medical/family/social history/allergies/current medications.  Past Medical History:  Diagnosis Date  . 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 Anemia   . Anxiety   . Eczema   . Environmental allergies   . GERD (gastroesophageal reflux disease)    Patient denies  . Headache(784.0)   . Hypertension   . Moderate intermittent asthma   . Respiratory failure, acute ([place]) 04/19/2014   Related to potential Anaphylaxis ? presumably to ASA - exacerbated by Moderate to Severe Asthma       Outpatient Medications Prior to Visit  Medication Sig Dispense Refill  . acetaminophen (TYLENOL) 160 MG/5ML suspension Take by mouth every 6 (six) hours as needed.    Marland Kitchen amLODipine (NORVASC) 10 MG tablet TAKE 1 TABLET BY MOUTH EVERY DAY FOR HIGH BLOOD PRESSURE 90 tablet 1  . bisoprolol (ZEBETA) 5 MG tablet TAKE 1/2 TABLET BY MOUTH EVERY DAY FOR HIGH BLOOD PRESSURE 45 tablet 1  . cetirizine (ZYRTEC) 10 MG  tablet TAKE 1 TABLET(10 MG) BY MOUTH DAILY 30 tablet 11  . EPINEPHrine 0.3 mg/0.3 mL IJ SOAJ injection     . ezetimibe (ZETIA) 10 MG tablet Take 1 tablet (10 mg total) by mouth daily. 30 tablet 5  . fluticasone (FLONASE) 50 MCG/ACT nasal spray SHAKE LIQUID AND USE 2 SPRAYS IN EACH NOSTRIL DAILY 48 g 5  . hydrALAZINE (APRESOLINE) 25 MG tablet Take 1 tablet (25 mg total) by mouth every 8 (eight) hours. 90 tablet 5  . NUCALA 100 MG/ML SOSY Inject into the skin.    . Omega-3 Fatty Acids (FISH OIL) 500 MG CAPS Take 1 capsule by mouth daily.    Marland Kitchen SHINGRIX injection     . triamcinolone ointment (KENALOG) 0.5 % APPLY TOPICALLY TWICE DAILY 120 g 0  . albuterol (VENTOLIN HFA) 108 (90 Base) MCG/ACT inhaler INHALE 2 PUFFS INTO THE LUNGS EVERY 6 HOURS AS NEEDED FOR WHEEZING OR SHORTNESS OF BREATH 6.7 g 5  . Fluticasone-Salmeterol (WIXELA INHUB) 250-50 MCG/DOSE AEPB Inhale 2 puffs into the lungs 2 (two) times daily. 120 each 5  . AFLURIA QUADRIVALENT 0.5 ML injection  (Patient not taking: Reported on 10/03/2020)    . DUPIXENT 300 MG/2ML prefilled syringe RX#2 INJECT 300MG  SUBCUTANEOUSLY EVERY 14 DAYS STARTING ON DAY 15 (Patient not taking: Reported on 10/03/2020) 4 mL 11   Facility-Administered Medications Prior to Visit  Medication Dose Route Frequency Provider Last Rate Last Admin  . dupilumab (DUPIXENT) prefilled  syringe 300 mg  300 mg Subcutaneous Q14 Days Valentina Shaggy, MD   300 mg at 09/05/20 1340  . Mepolizumab SOLR 100 mg  100 mg Subcutaneous Q28 days Valentina Shaggy, MD   100 mg at 09/18/20 1020    Review of Systems  Constitutional: Negative for chills, diaphoresis, fever, malaise/fatigue and weight loss.  HENT: Negative for congestion.   Respiratory: Positive for shortness of breath. Negative for cough, hemoptysis, sputum production and wheezing.   Cardiovascular: Negative for chest pain, palpitations and leg swelling.     Objective:   Vitals:   10/03/20 1012  BP: 124/68   Pulse: 93  Temp: 98.7 F (37.1 C)  TempSrc: Tympanic  SpO2: 95%  Weight: 151 lb 8 oz (68.7 kg)  Height: 4\' 9"  (1.448 m)   SpO2: 95 %  Physical Exam: General: Well-appearing, no acute distress HENT: [place], AT Eyes: EOMI, no scleral icterus Respiratory: Clear to auscultation bilaterally.  No crackles, wheezing or rales Cardiovascular: Stage II SEM, -M/R/G, no JVD Extremities:-Edema,-tenderness Neuro: AAO x4, CNII-XII grossly intact Psych: Normal mood, normal affect  Data Reviewed:  Imaging: CXR 04/19/14 - Mildly enlarged cardiac silhouette. No infiltrate CT Chest 08/31/20 - Normal parenchyma. No enlarged lymph nodes.   PFT: Spirometry 06/22/19 FEV1/FVC 67% FVC 46% FEV1 40% Interpretation: Severe obstructive defect  Spirometry 08/17/20 FVC 0.57 (34%) FEV1 0.45 (32%) Ratio 0.79 Interpretation: Decreased FVC and FEV1 suggestive of restrictive defect  Labs: CBC wit diff on 09/15/18 reviewed. Absolute eosinophil 462 (6.7%)  TTE: 03/17/18 EF 65-70%, no regional WMA, grade 1 diastolic dysfunction    Assessment & Plan:   Discussion: 59 year old never smoker with severe persistent asthma and probable aspirin exasperated respiratory disease who presents for follow-up. Well-controlled. On biologics managed by Allergy. Will refill medications and message Dr. Ernst Bowler to take over care for her asthma.  Moderate-Severe Persistent Asthma - controlled Aspirin-exacerbated respiratory disease --CONTINUE Wixela 250-50 mcg TWO puffs TWICE a day. REFILL --CONTINUE Albuterol as needed every 4 hours for shortness of breath or wheezing. REFILL --CONTINUE follow-up with Allergist for Nucala injections  Allergic Rhinitis --CONTINUE Flonase 1 spray per nostril --CONTINUE zyrtec 10 mg daily   Asthma Action Plan Increase Wixela to TWO puffs THREE times a day for worsening shortness of breath, wheezing and cough. If you symptoms do not improve in 24-48 hours, please our office for evaluation  and/or prednisone taper.  Abnormal PFTs secondary to poor effort PFTs demonstrate restrictive defect however CT with no evidence of ILD or any parenchymal abnormalities. Abnormal PFTs likely due to poor effort on testing day. No further management.  No orders of the defined types were placed in this encounter.  Meds ordered this encounter  Medications  . albuterol (VENTOLIN HFA) 108 (90 Base) MCG/ACT inhaler    Sig: INHALE 2 PUFFS INTO THE LUNGS EVERY 6 HOURS AS NEEDED FOR WHEEZING OR SHORTNESS OF BREATH    Dispense:  6.7 g    Refill:  6  . Fluticasone-Salmeterol (WIXELA INHUB) 250-50 MCG/DOSE AEPB    Sig: Inhale 2 puffs into the lungs 2 (two) times daily.    Dispense:  120 each    Refill:  6    Return if symptoms worsen or fail to improve.  I have spent a total time of 31-minutes on the day of the appointment reviewing prior documentation, coordinating care and discussing medical diagnosis and plan with the patient/family. Imaging, labs and tests included in this note have been reviewed and interpreted independently  by me.   Margaretha Seeds, MD [facility] 10/03/2020 10:51 AM  Office Number [phone]

## 2020-10-03 NOTE — Patient Instructions (Addendum)
Moderate-Severe Persistent Asthma - controlled Aspirin-exacerbated respiratory disease --CONTINUE Wixela 250-50 mcg TWO puffs TWICE a day. REFILL --CONTINUE Albuterol as needed every 4 hours for shortness of breath or wheezing. REFILL --CONTINUE follow-up with Allergist for Nucala injections  Follow-up as needed

## 2020-10-16 ENCOUNTER — Ambulatory Visit (INDEPENDENT_AMBULATORY_CARE_PROVIDER_SITE_OTHER): Payer: Medicare Other

## 2020-10-16 ENCOUNTER — Other Ambulatory Visit: Payer: Self-pay

## 2020-10-16 DIAGNOSIS — J309 Allergic rhinitis, unspecified: Secondary | ICD-10-CM

## 2020-10-16 DIAGNOSIS — J4550 Severe persistent asthma, uncomplicated: Secondary | ICD-10-CM

## 2020-11-07 ENCOUNTER — Telehealth: Payer: Self-pay | Admitting: Pulmonary Disease

## 2020-11-07 MED ORDER — FLUTICASONE-SALMETEROL 500-50 MCG/DOSE IN AEPB: 1.0000 | INHALATION_SPRAY | Freq: Two times a day (BID) | RESPIRATORY_TRACT | 6 refills | Status: DC

## 2020-11-07 NOTE — Telephone Encounter (Signed)
Changed Wixela dosage to 500-50 mcg one puff twice a day. Order placed. No further action.

## 2020-11-07 NOTE — Telephone Encounter (Signed)
Received a PA request from [facility] on [place] for patient's Wixela 29mcg. Attempted a PA on [email or web address]. Received a message from insurance stating that the [place] is already on the covered medications list and a PA is not needed.   Called [facility] who stated that patient's insurance will not cover for 2 puffs twice a day. Insurance will only cover 2 puffs per day.   JE, can you please advise if you want her to do 2 puffs twice or 1 puff in the morning and 1 puff in the evening?

## 2020-11-11 ENCOUNTER — Other Ambulatory Visit: Payer: Self-pay | Admitting: Nurse Practitioner

## 2020-11-12 ENCOUNTER — Other Ambulatory Visit: Payer: Self-pay | Admitting: Nurse Practitioner

## 2020-11-12 DIAGNOSIS — I10 Essential (primary) hypertension: Secondary | ICD-10-CM

## 2020-11-13 ENCOUNTER — Ambulatory Visit (INDEPENDENT_AMBULATORY_CARE_PROVIDER_SITE_OTHER): Payer: Medicare Other

## 2020-11-13 DIAGNOSIS — J4550 Severe persistent asthma, uncomplicated: Secondary | ICD-10-CM

## 2020-11-14 ENCOUNTER — Ambulatory Visit (INDEPENDENT_AMBULATORY_CARE_PROVIDER_SITE_OTHER): Payer: Medicare Other | Admitting: Allergy & Immunology

## 2020-11-14 ENCOUNTER — Other Ambulatory Visit: Payer: Self-pay

## 2020-11-14 ENCOUNTER — Encounter: Payer: Self-pay | Admitting: Allergy & Immunology

## 2020-11-14 VITALS — BP 110/60 | HR 74 | Temp 97.6°F | Resp 16 | Ht <= 58 in | Wt 150.6 lb

## 2020-11-14 DIAGNOSIS — J3089 Other allergic rhinitis: Secondary | ICD-10-CM | POA: Diagnosis not present

## 2020-11-14 DIAGNOSIS — J4540 Moderate persistent asthma, uncomplicated: Secondary | ICD-10-CM

## 2020-11-14 MED ORDER — CETIRIZINE HCL 10 MG PO TABS: ORAL_TABLET | ORAL | 11 refills | Status: DC

## 2020-11-14 MED ORDER — ALBUTEROL SULFATE HFA 108 (90 BASE) MCG/ACT IN AERS: INHALATION_SPRAY | RESPIRATORY_TRACT | 6 refills | Status: DC

## 2020-11-14 MED ORDER — FLUTICASONE PROPIONATE 50 MCG/ACT NA SUSP: NASAL | 5 refills | Status: DC

## 2020-11-14 NOTE — Progress Notes (Signed)
FOLLOW UP  Date of Service/Encounter:  11/14/20   Assessment:   Severe persistent asthma with restrictive spirometry - improved spiro today   Chronic rhinitis (grasses, molds)   Disorder of respiratory system exacerbated by aspirin   Nasal polyposis (L>R) - markedly improved on Dupixent (now changed to Nucala with worsening eczema control  Eczema - not well controlled since changing away from [place]   Vaccinated against COVID-19, including booster  Plan/Recommendations:   1. Moderate persistent asthma, uncomplicated - Lung testing looked better today. - However since you have having worsening congestion and worsening skin, we are going to move back to [place]. - We will jump through whatever hoops are necessary.  - Daily controller medication(s): Wixela 250/50 mcg one puff twice daily + Nucala one injection monthly  - Prior to physical activity: albuterol 2 puffs 10-15 minutes before physical activity. - Rescue medications: albuterol 4 puffs every 4-6 hours as needed - Asthma control goals:  * Full participation in all desired activities (may need albuterol before activity) * Albuterol use two time or less a week on average (not counting use with activity) * Cough interfering with sleep two time or less a month * Oral steroids no more than once a year * No hospitalizations  2. Chronic rhinitis with nasal polyps - Continue with fluticasone two sprays per nostril twice daily. - Continue with cetirizine 10mg  daily.   3. Eczema versus contact dermatitis  - The Dupixent was helping your skin more as well.  - Continue with your topical steroid to see if this helps with the rash.   4. Return in about 3 months (around 02/13/2021).   Subjective:   Tammie [name] is a 60 y.o. female presenting today for follow up of  Chief Complaint  Patient presents with  . Asthma    Tammie [name] has a history of the following: Patient Active Problem List   Diagnosis Date Noted  .  Type 2 diabetes mellitus with hyperglycemia ([place]) 05/11/2020  . Metabolic syndrome 67/34/1939  . Chest pain at rest 11/29/2019  . Mixed hyperlipidemia 11/10/2019  . Hyperglycemia 11/10/2019  . Dermatitis 06/22/2019  . Chronic rhinitis 04/06/2019  . Disorder of respiratory system exacerbated by aspirin 04/06/2019  . Nasal polyposis 04/06/2019  . Moderate persistent asthma without complication 90/24/0973  . Generalized anxiety disorder 09/25/2016  . Chronic seasonal allergic rhinitis 09/25/2016  . Aortic valve regurgitation 09/25/2016  . Other dysphagia 04/26/2014  . Hypertension 04/18/2014    History obtained from: chart review and patient.  Tammie [name] is a 60 y.o. female presenting for a follow up visit. She was last seen in January 2022.  At that time, her lung testing looks low, even lower than previous visits.  We did obtain a chest CT.  We continued with Wixela 250/50 mcg 1 puff twice daily in conjunction with Dupixent every 2 weeks.  For her allergic rhinitis, would continue with Flonase as well as Dupixent and cetirizine.  Evidently, in the interim, her insurance decided to no longer coverage Dupixent.  They required a failure of either Fasenra or mepolizumab.  She has now been placed on mepolizumab.  Chest CT performed demonstrated the following: 1. No evidence of interstitial lung disease. 2. Mild dilatation of the pulmonic trunk (3.7 cm in diameter), concerning for pulmonary arterial hypertension. 3. Multiple small 2-3 mm nonobstructive calculi and/or parenchymal calcifications in the upper pole of the left kidney incidentally noted. 4. Aortic atherosclerosis.  Sine the last visit, she has been hit and  miss. Some days are good and some days are bad. The tree season has bene particular bad for her.   Asthma/Respiratory Symptom History: She is on the mepolizumab monthly.  She does not feel that she has as good control of her symptoms as she did when she was on Dupixent.  She has  been using Wixela 1 puff twice daily.  She has not been using her albuterol much at all.  She does feel like she is short of breath more often than not.  Allergic Rhinitis Symptom History: She is on Flonase as well as cetirizine.  Without the [place], she feels like her congestion is quite a bit worse.  She does have a history of nasal polyps which were better controlled with the [place] compared with the mepolizumab.  She has not needed antibiotics at all since last visit.  Eczema Symptom History: Her eczema has been flaring more without the Dupixent.  She does have a topical steroid that she has been using.  She feels very dry, although she has moisturizing quite often.  Otherwise, there have been no changes to her past medical history, surgical history, family history, or social history.    Review of Systems  Constitutional: Negative.  Negative for fever, malaise/fatigue and weight loss.  HENT: Negative.  Negative for congestion, ear discharge and ear pain.   Eyes: Negative for pain, discharge and redness.  Respiratory: Positive for cough, shortness of breath and wheezing. Negative for sputum production.   Cardiovascular: Negative.  Negative for chest pain and palpitations.  Gastrointestinal: Negative for abdominal pain, constipation, diarrhea, heartburn, nausea and vomiting.  Skin: Negative.  Negative for itching and rash.  Neurological: Negative for dizziness and headaches.  Endo/Heme/Allergies: Negative for environmental allergies. Does not bruise/bleed easily.       Objective:   Blood pressure 110/60, pulse 74, temperature 97.6 F (36.4 C), temperature source Temporal, resp. rate 16, height 4\' 9"  (1.448 m), weight 150 lb 9.6 oz (68.3 kg), SpO2 95 %. Body mass index is 32.59 kg/m.   Physical Exam:  Physical Exam Constitutional:      Appearance: She is well-developed.     Comments: Smiling and talkative.  HENT:     Head: Normocephalic and atraumatic.     Right Ear:  Tympanic membrane, ear canal and external ear normal.     Left Ear: Tympanic membrane, ear canal and external ear normal.     Nose: Mucosal edema and rhinorrhea present. No nasal deformity or septal deviation.     Right Turbinates: Enlarged, swollen and pale.     Left Turbinates: Enlarged, swollen and pale.     Right Sinus: No maxillary sinus tenderness or frontal sinus tenderness.     Left Sinus: No maxillary sinus tenderness or frontal sinus tenderness.     Comments: Nasal polyps appear to be worsening on the left, although they are present bilaterally.    Mouth/Throat:     Mouth: Mucous membranes are not pale and not dry.     Pharynx: Uvula midline.  Eyes:     General:        Right eye: No discharge.        Left eye: No discharge.     Conjunctiva/sclera: Conjunctivae normal.     Right eye: Right conjunctiva is not injected. No chemosis.    Left eye: Left conjunctiva is not injected. No chemosis.    Pupils: Pupils are equal, round, and reactive to light.  Cardiovascular:     Rate and  Rhythm: Normal rate and regular rhythm.     Heart sounds: Normal heart sounds.  Pulmonary:     Effort: Pulmonary effort is normal. No tachypnea, accessory muscle usage or respiratory distress.     Breath sounds: Normal breath sounds. No wheezing, rhonchi or rales.  Chest:     Chest wall: No tenderness.  Lymphadenopathy:     Cervical: No cervical adenopathy.  Skin:    General: Skin is warm.     Capillary Refill: Capillary refill takes less than 2 seconds.     Coloration: Skin is not pale.     Findings: No abrasion, erythema, petechiae or rash. Rash is not papular, urticarial or vesicular.     Comments: Excoriations present on the bilateral ankles.  Neurological:     Mental Status: She is alert.  Psychiatric:        Behavior: Behavior is cooperative.      Diagnostic studies:    Spirometry: results abnormal (FEV1: 0.72/44%, FVC: 1.05/50%, FEV1/FVC: 69%).    Spirometry consistent with possible  restrictive disease.    Allergy Studies: none       Salvatore Marvel, MD  [facility]

## 2020-11-14 NOTE — Patient Instructions (Addendum)
1. Moderate persistent asthma, uncomplicated - Lung testing looked better today. - However since you have having worsening congestion and worsening skin, we are going to move back to [place]. - We will jump through whatever hoops are necessary.  - Daily controller medication(s): Wixela 250/50 mcg one puff twice daily + Nucala one injection monthly  - Prior to physical activity: albuterol 2 puffs 10-15 minutes before physical activity. - Rescue medications: albuterol 4 puffs every 4-6 hours as needed - Asthma control goals:  * Full participation in all desired activities (may need albuterol before activity) * Albuterol use two time or less a week on average (not counting use with activity) * Cough interfering with sleep two time or less a month * Oral steroids no more than once a year * No hospitalizations  2. Chronic rhinitis with nasal polyps - Continue with fluticasone two sprays per nostril twice daily. - Continue with cetirizine 10mg  daily.   3. Eczema versus contact dermatitis  - The Dupixent was helping your skin more as well.  - Continue with your topical steroid to see if this helps with the rash.   4. Return in about 3 months (around 02/13/2021).    Please inform us of any Emergency Department visits, hospitalizations, or changes in symptoms. Call us before going to the ED for breathing or allergy symptoms since we might be able to fit you in for a sick visit. Feel free to contact us anytime with any questions, problems, or concerns.  It was a pleasure to see you again today!  Websites that have reliable patient information: 1. American Academy of Asthma, Allergy, and Immunology: www.aaaai.org 2. Food Allergy Research and Education (FARE): foodallergy.org 3. Mothers of Asthmatics: http://www.asthmacommunitynetwork.org 4. American College of Allergy, Asthma, and Immunology: www.acaai.org   COVID-19 Vaccine Information can be found at:  ShippingScam.co.uk For questions related to vaccine distribution or appointments, please email [email or web address] or call [phone].   We realize that you might be concerned about having an allergic reaction to the COVID19 vaccines. To help with that concern, WE ARE OFFERING THE COVID19 VACCINES IN OUR OFFICE! Ask the front desk for dates!     "Like" [place] on Facebook and Instagram for our latest updates!      A healthy democracy works best when [employer] participate! Make sure you are registered to vote! If you have moved or changed any of your contact information, you will need to get this updated before voting!  In some cases, you MAY be able to register to vote online: [email or web address]

## 2020-11-15 ENCOUNTER — Telehealth: Payer: Self-pay | Admitting: *Deleted

## 2020-11-15 NOTE — Telephone Encounter (Signed)
L/M for patient to contact me regarding approval and resubmit for Dupixent and d/c Nucala

## 2020-11-16 ENCOUNTER — Other Ambulatory Visit: Payer: Self-pay

## 2020-11-16 ENCOUNTER — Ambulatory Visit (AMBULATORY_SURGERY_CENTER): Payer: Self-pay | Admitting: *Deleted

## 2020-11-16 VITALS — Ht <= 58 in | Wt 151.0 lb

## 2020-11-16 DIAGNOSIS — Z86010 Personal history of colonic polyps: Secondary | ICD-10-CM

## 2020-11-16 MED ORDER — SUPREP BOWEL PREP KIT 17.5-3.13-1.6 GM/177ML PO SOLN: 1.0000 | Freq: Once | ORAL | 0 refills | Status: AC

## 2020-11-16 NOTE — Progress Notes (Signed)

## 2020-11-23 NOTE — Telephone Encounter (Signed)
Patient advised of approval for Dupixent and new Rx sent to [facility] to restart same in place of [place]

## 2020-11-30 ENCOUNTER — Other Ambulatory Visit: Payer: Self-pay

## 2020-11-30 ENCOUNTER — Ambulatory Visit (AMBULATORY_SURGERY_CENTER): Payer: Medicare Other | Admitting: Gastroenterology

## 2020-11-30 ENCOUNTER — Encounter: Payer: Self-pay | Admitting: Gastroenterology

## 2020-11-30 VITALS — BP 125/86 | HR 65 | Temp 98.4°F | Resp 20 | Ht <= 58 in | Wt 151.0 lb

## 2020-11-30 DIAGNOSIS — Z86010 Personal history of colonic polyps: Secondary | ICD-10-CM | POA: Diagnosis not present

## 2020-11-30 DIAGNOSIS — K635 Polyp of colon: Secondary | ICD-10-CM

## 2020-11-30 DIAGNOSIS — D125 Benign neoplasm of sigmoid colon: Secondary | ICD-10-CM

## 2020-11-30 MED ORDER — SODIUM CHLORIDE 0.9 % IV SOLN: 500.0000 mL | Freq: Once | INTRAVENOUS | Status: DC

## 2020-11-30 NOTE — Patient Instructions (Signed)
Handout on polyps and diverticulosis given. Call Dr. Doyne Keel office to reschedule colon within the next 6 months.    YOU HAD AN ENDOSCOPIC PROCEDURE TODAY AT [facility]:   Refer to the procedure report that was given to you for any specific questions about what was found during the examination.  If the procedure report does not answer your questions, please call your gastroenterologist to clarify.  If you requested that your care partner not be given the details of your procedure findings, then the procedure report has been included in a sealed envelope for you to review at your convenience later.  YOU SHOULD EXPECT: Some feelings of bloating in the abdomen. Passage of more gas than usual.  Walking can help get rid of the air that was put into your GI tract during the procedure and reduce the bloating. If you had a lower endoscopy (such as a colonoscopy or flexible sigmoidoscopy) you may notice spotting of blood in your stool or on the toilet paper. If you underwent a bowel prep for your procedure, you may not have a normal bowel movement for a few days.  Please Note:  You might notice some irritation and congestion in your nose or some drainage.  This is from the oxygen used during your procedure.  There is no need for concern and it should clear up in a day or so.  SYMPTOMS TO REPORT IMMEDIATELY:   Following lower endoscopy (colonoscopy or flexible sigmoidoscopy):  Excessive amounts of blood in the stool  Significant tenderness or worsening of abdominal pains  Swelling of the abdomen that is new, acute  Fever of 100F or higher   For urgent or emergent issues, a gastroenterologist can be reached at any hour by calling [phone]. Do not use MyChart messaging for urgent concerns.    DIET:  We do recommend a small meal at first, but then you may proceed to your regular diet.  Drink plenty of fluids but you should avoid alcoholic beverages for 24 hours.  ACTIVITY:   You should plan to take it easy for the rest of today and you should NOT DRIVE or use heavy machinery until tomorrow (because of the sedation medicines used during the test).    FOLLOW UP: Our staff will call the number listed on your records 48-72 hours following your procedure to check on you and address any questions or concerns that you may have regarding the information given to you following your procedure. If we do not reach you, we will leave a message.  We will attempt to reach you two times.  During this call, we will ask if you have developed any symptoms of COVID 19. If you develop any symptoms (ie: fever, flu-like symptoms, shortness of breath, cough etc.) before then, please call [phone].  If you test positive for Covid 19 in the 2 weeks post procedure, please call and report this information to [place].    If any biopsies were taken you will be contacted by phone or by letter within the next 1-3 weeks.  Please call us at [phone] if you have not heard about the biopsies in 3 weeks.    SIGNATURES/CONFIDENTIALITY: You and/or your care partner have signed paperwork which will be entered into your electronic medical record.  These signatures attest to the fact that that the information above on your After Visit Summary has been reviewed and is understood.  Full responsibility of the confidentiality of this discharge information lies with you  and/or your care-partner. 

## 2020-11-30 NOTE — Progress Notes (Signed)
Called to room to assist during endoscopic procedure.  Patient ID and intended procedure confirmed with present staff. Received instructions for my participation in the procedure from the performing physician.  

## 2020-11-30 NOTE — Op Note (Signed)
[facility] Patient Name: Tammie [name] Procedure Date: 11/30/2020 9:52 AM MRN: [id number] Endoscopist: Remo Lipps P. Havery Moros , MD Age: 60 Referring MD:  Date of Birth: Mar 24, 1961 Gender: Female Account #: [id number] Procedure:                Colonoscopy Indications:              High risk colon cancer surveillance: Personal                            history of colonic polyps (6 adenomas removed                            06/2017) Medicines:                Monitored Anesthesia Care Procedure:                Pre-Anesthesia Assessment:                           - Prior to the procedure, a History and Physical                            was performed, and patient medications and                            allergies were reviewed. The patient's tolerance of                            previous anesthesia was also reviewed. The risks                            and benefits of the procedure and the sedation                            options and risks were discussed with the patient.                            All questions were answered, and informed consent                            was obtained. Prior Anticoagulants: The patient has                            taken no previous anticoagulant or antiplatelet                            agents. ASA Grade Assessment: II - A patient with                            mild systemic disease. After reviewing the risks                            and benefits, the patient was deemed in  satisfactory condition to undergo the procedure.                           After obtaining informed consent, the colonoscope                            was passed under direct vision. Throughout the                            procedure, the patient's blood pressure, pulse, and                            oxygen saturations were monitored continuously. The                            Olympus PFC-[id number] (#[id number]) Colonoscope was                             introduced through the anus and advanced to the the                            cecum, identified by appendiceal orifice and                            ileocecal valve. The colonoscopy was performed                            without difficulty. The patient tolerated the                            procedure well. The quality of the bowel                            preparation was inadequate. The ileocecal valve,                            appendiceal orifice, and rectum were photographed. Scope In: 9:57:25 AM Scope Out: 10:11:28 AM Scope Withdrawal Time: 0 hours 6 minutes 28 seconds  Total Procedure Duration: 0 hours 14 minutes 3 seconds  Findings:                 The perianal and digital rectal examinations were                            normal.                           Two sessile polyps were found in the sigmoid colon.                            The polyps were 3 mm in size. These polyps were                            removed with a cold snare. Resection and retrieval  were complete.                           A few small-mouthed diverticula were found in the                            sigmoid colon.                           A large amount of semi-liquid stool was found in                            the entire colon, making visualization difficult.                            Right colon had highest stool burden - thick liquid                            stool which could not be lavaged off the surface of                            the colon. Lavage of the colon was performed using                            copious amounts of sterile water, resulting in                            incomplete clearance with fair to poor                            visualization.                           Internal hemorrhoids were found during                            retroflexion. The hemorrhoids were small.                           The exam was otherwise  without abnormality of what                            was visualized. Complications:            No immediate complications. Estimated blood loss:                            Minimal. Estimated Blood Loss:     Estimated blood loss was minimal. Impression:               - Preparation of the colon was inadequate as above.                           - Two 3 mm polyps in the sigmoid colon, removed  with a cold snare. Resected and retrieved.                           - Diverticulosis in the sigmoid colon.                           - Internal hemorrhoids.                           - The examination was otherwise normal. Recommendation:           - Patient has a contact number available for                            emergencies. The signs and symptoms of potential                            delayed complications were discussed with the                            patient. Return to normal activities tomorrow.                            Written discharge instructions were provided to the                            patient.                           - Resume previous diet.                           - Continue present medications.                           - Await pathology results.                           - Repeat colonoscopy using double prep in the next                            6 months or so, or whenever the patient is willing.                            Will discuss options. Remo Lipps P. Maye Parkinson, MD 11/30/2020 10:18:00 AM This report has been signed electronically.

## 2020-11-30 NOTE — Progress Notes (Signed)
Medical history reviewed with no changes since PV. VS assessed by C.W 

## 2020-11-30 NOTE — Progress Notes (Signed)
A/ox3, pleased with MAC, report to RN 

## 2020-12-01 ENCOUNTER — Other Ambulatory Visit: Payer: Self-pay | Admitting: Nurse Practitioner

## 2020-12-01 ENCOUNTER — Telehealth: Payer: Self-pay

## 2020-12-01 DIAGNOSIS — I10 Essential (primary) hypertension: Secondary | ICD-10-CM

## 2020-12-01 NOTE — Telephone Encounter (Signed)
Patient has been scheduled for a follow up with Dr. Havery Moros on Tuesday, 01/23/21 at 10:30 AM to discuss recommended repeat colonoscopy in 6 months per written order on 11/30/20 procedure note.   Letter mailed to patient with appointment information.

## 2020-12-04 ENCOUNTER — Telehealth: Payer: Self-pay

## 2020-12-04 NOTE — Telephone Encounter (Signed)
  Follow up Call-  Call back number 11/30/2020  Post procedure Call Back phone  # [phone]  Permission to leave phone message Yes  Some recent data might be hidden     Patient questions:  Do you have a fever, pain , or abdominal swelling? No. Pain Score  0 *  Have you tolerated food without any problems? Yes.    Have you been able to return to your normal activities? Yes.    Do you have any questions about your discharge instructions: Diet   No. Medications  No. Follow up visit  No.  Do you have questions or concerns about your Care? No.  Actions: * If pain score is 4 or above: No action needed, pain <4.  1. Have you developed a fever since your procedure? no  2.   Have you had an respiratory symptoms (SOB or cough) since your procedure? no  3.   Have you tested positive for COVID 19 since your procedure no  4.   Have you had any family members/close contacts diagnosed with the COVID 19 since your procedure?  no   If yes to any of these questions please route to Joylene John, RN and Joella Prince, RN

## 2020-12-07 ENCOUNTER — Encounter: Payer: Self-pay | Admitting: Gastroenterology

## 2020-12-11 ENCOUNTER — Ambulatory Visit (INDEPENDENT_AMBULATORY_CARE_PROVIDER_SITE_OTHER): Payer: Medicare Other | Admitting: *Deleted

## 2020-12-11 ENCOUNTER — Other Ambulatory Visit: Payer: Self-pay

## 2020-12-11 DIAGNOSIS — J4540 Moderate persistent asthma, uncomplicated: Secondary | ICD-10-CM

## 2020-12-11 MED ORDER — DUPILUMAB 300 MG/2ML ~~LOC~~ SOSY
600.0000 mg | PREFILLED_SYRINGE | Freq: Once | SUBCUTANEOUS | Status: AC
  Administered 2020-12-11: 600 mg via SUBCUTANEOUS

## 2020-12-11 NOTE — Progress Notes (Signed)
Immunotherapy   Patient Details  Name: Dreanna Kyllo MRN: [id number] Date of Birth: 09-20-1960  12/11/2020  Inez Catalina started injections for  Dupixent  Frequency: Every 2 Weeks Epi-Pen:Epi-Pen Available  Consent signed and patient instructions given. Patent restarted her Dupixent today and received 600mg  loading dose. Patient waited 30 minutes in office and did not experience any issues.    Darron Stuck Fernandez-[place] 12/11/2020, 10:13 AM

## 2020-12-26 ENCOUNTER — Ambulatory Visit (INDEPENDENT_AMBULATORY_CARE_PROVIDER_SITE_OTHER): Payer: Medicare Other | Admitting: *Deleted

## 2020-12-26 ENCOUNTER — Other Ambulatory Visit: Payer: Self-pay

## 2020-12-26 DIAGNOSIS — J4540 Moderate persistent asthma, uncomplicated: Secondary | ICD-10-CM

## 2021-01-10 ENCOUNTER — Ambulatory Visit (INDEPENDENT_AMBULATORY_CARE_PROVIDER_SITE_OTHER): Payer: Medicare Other | Admitting: Allergy

## 2021-01-10 ENCOUNTER — Other Ambulatory Visit: Payer: Self-pay

## 2021-01-10 DIAGNOSIS — J4540 Moderate persistent asthma, uncomplicated: Secondary | ICD-10-CM | POA: Diagnosis not present

## 2021-01-23 ENCOUNTER — Encounter: Payer: Self-pay | Admitting: Gastroenterology

## 2021-01-23 ENCOUNTER — Other Ambulatory Visit: Payer: Self-pay

## 2021-01-23 ENCOUNTER — Ambulatory Visit (INDEPENDENT_AMBULATORY_CARE_PROVIDER_SITE_OTHER): Payer: Medicare Other | Admitting: Gastroenterology

## 2021-01-23 VITALS — BP 136/72 | HR 88 | Ht <= 58 in | Wt 153.0 lb

## 2021-01-23 DIAGNOSIS — Z86010 Personal history of colonic polyps: Secondary | ICD-10-CM

## 2021-01-23 MED ORDER — SUTAB 1479-225-188 MG PO TABS: 1.0000 | ORAL_TABLET | Freq: Once | ORAL | 0 refills | Status: AC

## 2021-01-23 NOTE — Progress Notes (Signed)
HPI :  60 year old female here for a follow-up visit for failed colonoscopy.  She has a history of colon polyps back in 2018, 8 polyps removed, most were adenomatous.  She had another colonoscopy in April of this year where she had 2 small sigmoid hyperplastic polyps removed and diverticulosis noted however the exam was limited due to poor bowel prep and the colon could not be cleared.  She denies any chronic constipation, she states this is happens occasionally but is not a common occurrence.  No blood in her stools.  No new abdominal pains that concern her.  She has no family history of colon cancer.  She was able to drink the entire prep during her last procedure and did not vomit any of it.  She followed directions in regards to her dietary restrictions as well.  She otherwise feels well without complaints.  She denies any cardiopulmonary symptoms today.  Colonoscopy 11/30/20 - The perianal and digital rectal examinations were normal. - Two sessile polyps were found in the sigmoid colon. The polyps were 3 mm in size. These polyps were removed with a cold snare. Resection and retrieval were complete. - A few small-mouthed diverticula were found in the sigmoid colon. - A large amount of semi-liquid stool was found in the entire colon, making visualization difficult. Right colon had highest stool burden - thick liquid stool which could not be lavaged off the surface of the colon. Lavage of the colon was performed using copious amounts of sterile water, resulting in incomplete clearance with fair to poor visualization. - Internal hemorrhoids were found during retroflexion. The hemorrhoids were small. - The exam was otherwise without abnormality of what was visualized.  Diagnosis Surgical [P], colon, sigmoid, polyp (1) - HYPERPLASTIC POLYP. - NO DYSPLASIA OR MALIGNANCY.   Colonoscopy 06/19/2017 - The perianal and digital rectal examinations were normal. - Two sessile polyps were found in  the ascending colon. The polyps were 4 to 5 mm in size. These polyps were removed with a cold snare. Resection and retrieval were complete. - A 5 mm polyp was found in the hepatic flexure. The polyp was sessile. The polyp was removed with a cold snare. Resection and retrieval were complete. - A 4 mm polyp was found in the descending colon. The polyp was sessile. The polyp was removed with a cold snare. Resection and retrieval were complete. - Two sessile polyps were found in the sigmoid colon. The polyps were 4 to 5 mm in size. These polyps were removed with a cold snare. Resection and retrieval were complete. - A 4 mm polyp was found in the recto-sigmoid colon. The polyp was sessile. The polyp was removed with a cold snare. Resection and retrieval were complete. - A 3 mm polyp was found in the rectum. The polyp was sessile. The polyp was removed with a cold snare. Resection and retrieval were complete. - Multiple medium-mouthed diverticula were found in the left colon. - Internal hemorrhoids were found during retroflexion.  Diagnosis Surgical [P], rectum, sigmoid, descending, hepatic flexure, ascending, recto-sigmoid, polyp (8) - TUBULAR ADENOMA (X6 FRAGMENTS). - HYPERPLASTIC POLYP (X5 FRAGMENTS). 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 presumably to ASA - exacerbated by Moderate to Severe Asthma     Past Surgical History:  Procedure Laterality Date   ABDOMINAL HYSTERECTOMY  1993   [facility]    COLONOSCOPY     NASAL SINUS SURGERY     Polyps   POLYPECTOMY      TRANSTHORACIC ECHOCARDIOGRAM  04/2014   in setting of cardiopulmonary arrest): EF 65 to 70%.  Mild concentric LVH.  GR 1 DD.  Mild AI.;;  August 2019: EF 55 to 60%.  Mild LVH.  GR 1 DD.  Mild AI/MR.  Mildly increased PA pressures.   Family History  Problem Relation Age of Onset   Heart attack Father    Heart attack Mother    Diabetes Sister    High blood pressure Brother    Hypertension Brother    Asthma Brother    High blood pressure Sister    Cancer Sister    Lung cancer Sister    High blood pressure Son    Cancer Maternal Grandmother    Heart disease Paternal Grandmother    Pancreatic cancer Paternal Grandmother    Asthma Other        Great paternal grandfather    Autism Son    Asthma Other        Neice    Colon cancer Neg Hx    Esophageal cancer Neg Hx    Rectal cancer Neg Hx    Stomach cancer Neg Hx    Allergic rhinitis Neg Hx    Angioedema Neg Hx    Eczema Neg Hx    Immunodeficiency Neg Hx    Atopy Neg Hx    Colon polyps Neg Hx    Social History   Tobacco Use   Smoking status: Never   Smokeless tobacco: Never  Vaping Use   Vaping Use: Never used  Substance Use Topics   Alcohol use: No   Drug use: No   Current Outpatient Medications  Medication Sig Dispense Refill   acetaminophen (TYLENOL) 160 MG/5ML suspension Take by mouth every 6 (six) hours as needed.     albuterol (VENTOLIN HFA) 108 (90 Base) MCG/ACT inhaler INHALE 2 PUFFS INTO THE LUNGS EVERY 6 HOURS AS NEEDED FOR WHEEZING OR SHORTNESS OF BREATH 6.7 g 6   amLODipine (NORVASC) 10 MG tablet TAKE 1 TABLET BY MOUTH EVERY DAY FOR HIGH BLOOD PRESSURE 90 tablet 1   bisoprolol (ZEBETA) 5 MG tablet TAKE 1/2 TABLET BY MOUTH EVERY DAY FOR HIGH BLOOD PRESSURE 45 tablet 1   cetirizine (ZYRTEC) 10 MG tablet TAKE 1 TABLET(10 MG) BY MOUTH DAILY 30 tablet 11   EPINEPHrine 0.3 mg/0.3 mL IJ SOAJ injection      ezetimibe (ZETIA) 10 MG tablet TAKE 1 TABLET(10 MG) BY MOUTH DAILY 30 tablet 5   fluticasone (FLONASE) 50  MCG/ACT nasal spray SHAKE LIQUID AND USE 2 SPRAYS IN EACH NOSTRIL DAILY 48 g 5   Fluticasone-Salmeterol (WIXELA INHUB) 500-50 MCG/DOSE AEPB Inhale 1 puff into the lungs in the morning and at bedtime. 60 each 6   hydrALAZINE (APRESOLINE) 25 MG tablet Take 1 tablet (25 mg total) by mouth every 8 (eight) hours. 90 tablet 5   NUCALA 100 MG/ML SOSY Inject into the skin.     Omega-3 Fatty Acids (FISH OIL) 500 MG CAPS Take 1 capsule  by mouth daily.     SHINGRIX injection      triamcinolone ointment (KENALOG) 0.5 % APPLY TOPICALLY TWICE DAILY 120 g 0   Current Facility-Administered Medications  Medication Dose Route Frequency Provider Last Rate Last Admin   dupilumab (DUPIXENT) prefilled syringe 300 mg  300 mg Subcutaneous Q14 Days Valentina Shaggy, MD   300 mg at 01/10/21 1018   Mepolizumab SOLR 100 mg  100 mg Subcutaneous Q28 days Valentina Shaggy, MD   100 mg at 11/13/20 1638   Allergies  Allergen Reactions   Ace Inhibitors Other (See Comments)    Angioedema   Aspirin Anaphylaxis, Swelling and Other (See Comments)    Throat swelling    Doxepin Anaphylaxis   Ibuprofen Anaphylaxis, Swelling and Other (See Comments)    Throat swelling   Nsaids Anaphylaxis, Swelling and Other (See Comments)   Lipitor [Atorvastatin] Swelling and Other (See Comments)    Face, feet swell     Review of Systems: All systems reviewed and negative except where noted in HPI.   Lab Results  Component Value Date   WBC 5.9 08/11/2020   HGB 13.8 08/11/2020   HCT 42.0 08/11/2020   MCV 83.0 08/11/2020   PLT 327 08/11/2020    Lab Results  Component Value Date   CREATININE 0.89 08/11/2020   BUN 9 08/11/2020   NA 143 08/11/2020   K 3.9 08/11/2020   CL 107 08/11/2020   CO2 28 08/11/2020      Physical Exam: BP 136/72   Pulse 88   Ht 4\' 9"  (1.448 m)   Wt 153 lb (69.4 kg)   BMI 33.11 kg/m  Constitutional: Pleasant,well-developed, female in no acute distress. Neurological: Alert and oriented  to person place and time. Skin: Skin is warm and dry. No rashes noted. Psychiatric: Normal mood and affect. Behavior is normal.   ASSESSMENT AND PLAN: 60 year old female here for reassessment of following:  History of colon polyps Incomplete colonoscopy / Failed bowel prep  Unclear while this preparation did not work as she followed directions and denies any chronic constipation.  We discussed however that given her limited exam I am recommending another colonoscopy to clear her colon before we lengthen her surveillance.  We discussed colonoscopy, risk benefits of that and anesthesia and she wants to proceed.  We discussed bowel prep options, we will plan on a double preparation to ensure she is adequately prepped for this exam.  Further recommendations pending results and her course.  She agreed  Mellette Cellar, MD [facility]

## 2021-01-23 NOTE — Patient Instructions (Addendum)
You have been scheduled for a colonoscopy. Please follow written instructions given to you at your visit today.  Please pick up your prep supplies at the pharmacy within the next 1-3 days. If you use inhalers (even only as needed), please bring them with you on the day of your procedure.  If you are age 62 or younger, your body mass index should be between 19-25. Your Body mass index is 33.11 kg/m. If this is out of the aformentioned range listed, please consider follow up with your Primary Care Provider.  __________________________________________________________  The [facility] providers would like to encourage you to use [facility] to communicate with providers for non-urgent requests or questions.  Due to long hold times on the telephone, sending your provider a message by [facility] may be a faster and more efficient way to get a response.  Please allow 48 business hours for a response.  Please remember that this is for non-urgent requests.   Due to recent changes in healthcare laws, you may see the results of your imaging and laboratory studies on MyChart before your provider has had a chance to review them.  We understand that in some cases there may be results that are confusing or concerning to you. Not all laboratory results come back in the same time frame and the provider may be waiting for multiple results in order to interpret others.  Please give [place] 48 hours in order for your provider to thoroughly review all the results before contacting the office for clarification of your results.   You have been scheduled for a colonoscopy. Please follow the written instructions given to you at your visit today. Please pick up your prep supplies at the pharmacy within the next 1-3 days. If you use inhalers (even only as needed), please bring them with you on the day of your procedure.  Thank you for entrusting me with your care and for choosing [facility], Dr. Carrollton Cellar

## 2021-01-24 ENCOUNTER — Ambulatory Visit (INDEPENDENT_AMBULATORY_CARE_PROVIDER_SITE_OTHER): Payer: Medicare Other

## 2021-01-24 DIAGNOSIS — J4550 Severe persistent asthma, uncomplicated: Secondary | ICD-10-CM

## 2021-02-07 ENCOUNTER — Encounter: Payer: Medicare Other | Admitting: Gastroenterology

## 2021-02-07 ENCOUNTER — Other Ambulatory Visit: Payer: Self-pay

## 2021-02-07 ENCOUNTER — Telehealth: Payer: Self-pay | Admitting: Gastroenterology

## 2021-02-07 ENCOUNTER — Ambulatory Visit (AMBULATORY_SURGERY_CENTER): Payer: Medicare Other | Admitting: Gastroenterology

## 2021-02-07 ENCOUNTER — Encounter: Payer: Self-pay | Admitting: Gastroenterology

## 2021-02-07 VITALS — BP 117/68 | HR 69 | Temp 98.6°F | Resp 28 | Ht <= 58 in | Wt 153.0 lb

## 2021-02-07 DIAGNOSIS — D123 Benign neoplasm of transverse colon: Secondary | ICD-10-CM | POA: Diagnosis not present

## 2021-02-07 DIAGNOSIS — Z86010 Personal history of colonic polyps: Secondary | ICD-10-CM | POA: Diagnosis not present

## 2021-02-07 DIAGNOSIS — D125 Benign neoplasm of sigmoid colon: Secondary | ICD-10-CM

## 2021-02-07 MED ORDER — SODIUM CHLORIDE 0.9 % IV SOLN: 500.0000 mL | Freq: Once | INTRAVENOUS | Status: DC

## 2021-02-07 NOTE — Patient Instructions (Signed)
Handout given:  polyps, diverticulosis, hemorrhoids Resume previous diet Continue present medications Await pathology results  YOU HAD AN ENDOSCOPIC PROCEDURE TODAY AT [facility]:   Refer to the procedure report that was given to you for any specific questions about what was found during the examination.  If the procedure report does not answer your questions, please call your gastroenterologist to clarify.  If you requested that your care partner not be given the details of your procedure findings, then the procedure report has been included in a sealed envelope for you to review at your convenience later.  YOU SHOULD EXPECT: Some feelings of bloating in the abdomen. Passage of more gas than usual.  Walking can help get rid of the air that was put into your GI tract during the procedure and reduce the bloating. If you had a lower endoscopy (such as a colonoscopy or flexible sigmoidoscopy) you may notice spotting of blood in your stool or on the toilet paper. If you underwent a bowel prep for your procedure, you may not have a normal bowel movement for a few days.  Please Note:  You might notice some irritation and congestion in your nose or some drainage.  This is from the oxygen used during your procedure.  There is no need for concern and it should clear up in a day or so.  SYMPTOMS TO REPORT IMMEDIATELY:  Following lower endoscopy (colonoscopy or flexible sigmoidoscopy):  Excessive amounts of blood in the stool  Significant tenderness or worsening of abdominal pains  Swelling of the abdomen that is new, acute  Fever of 100F or higher  For urgent or emergent issues, a gastroenterologist can be reached at any hour by calling [phone]. Do not use MyChart messaging for urgent concerns.    DIET:  We do recommend a small meal at first, but then you may proceed to your regular diet.  Drink plenty of fluids but you should avoid alcoholic beverages for 24  hours.  ACTIVITY:  You should plan to take it easy for the rest of today and you should NOT DRIVE or use heavy machinery until tomorrow (because of the sedation medicines used during the test).    FOLLOW UP: Our staff will call the number listed on your records 48-72 hours following your procedure to check on you and address any questions or concerns that you may have regarding the information given to you following your procedure. If we do not reach you, we will leave a message.  We will attempt to reach you two times.  During this call, we will ask if you have developed any symptoms of COVID 19. If you develop any symptoms (ie: fever, flu-like symptoms, shortness of breath, cough etc.) before then, please call [phone].  If you test positive for Covid 19 in the 2 weeks post procedure, please call and report this information to [place].    If any biopsies were taken you will be contacted by phone or by letter within the next 1-3 weeks.  Please call us at [phone] if you have not heard about the biopsies in 3 weeks.    SIGNATURES/CONFIDENTIALITY: You and/or your care partner have signed paperwork which will be entered into your electronic medical record.  These signatures attest to the fact that that the information above on your After Visit Summary has been reviewed and is understood.  Full responsibility of the confidentiality of this discharge information lies with you and/or your care-partner.

## 2021-02-07 NOTE — Progress Notes (Signed)
pt tolerated well. VSS. awake and to recovery. Report given to RN.  

## 2021-02-07 NOTE — Op Note (Signed)
[place] Patient Name: Tammie [name] Procedure Date: 02/07/2021 11:29 AM MRN: [id number] Endoscopist: Remo Lipps P. Havery Moros , MD Age: 60 Referring MD:  Date of Birth: 1960/10/19 Gender: Female Account #: [id number] Procedure:                Colonoscopy Indications:              High risk colon cancer surveillance: Personal                            history of colonic polyps (6 adenomas removed                            06/2017), last exam in April 2022 limited by poor                            prep, double prep used for this exam Medicines:                Monitored Anesthesia Care Procedure:                Pre-Anesthesia Assessment:                           - Prior to the procedure, a History and Physical                            was performed, and patient medications and                            allergies were reviewed. The patient's tolerance of                            previous anesthesia was also reviewed. The risks                            and benefits of the procedure and the sedation                            options and risks were discussed with the patient.                            All questions were answered, and informed consent                            was obtained. Prior Anticoagulants: The patient has                            taken no previous anticoagulant or antiplatelet                            agents. ASA Grade Assessment: III - A patient with                            severe systemic disease. After reviewing the risks  and benefits, the patient was deemed in                            satisfactory condition to undergo the procedure.                           After obtaining informed consent, the colonoscope                            was passed under direct vision. Throughout the                            procedure, the patient's blood pressure, pulse, and                            oxygen saturations were  monitored continuously. The                            Olympus [id number]-[id number] (#[id number]) Colonoscope was                            introduced through the anus and advanced to the the                            cecum, identified by appendiceal orifice and                            ileocecal valve. The colonoscopy was performed                            without difficulty. The patient tolerated the                            procedure well. The quality of the bowel                            preparation was adequate. The ileocecal valve,                            appendiceal orifice, and rectum were photographed. Scope In: 11:34:40 AM Scope Out: 11:55:18 AM Scope Withdrawal Time: 0 hours 16 minutes 25 seconds  Total Procedure Duration: 0 hours 20 minutes 38 seconds  Findings:                 The perianal and digital rectal examinations were                            normal.                           A 3 mm polyp was found in the hepatic flexure. The                            polyp was sessile. The polyp was removed with a  cold snare. Resection and retrieval were complete.                           Three sessile polyps were found in the sigmoid                            colon. The polyps were 3 mm in size. These polyps                            were removed with a cold snare. Resection and                            retrieval were complete.                           Multiple small-mouthed diverticula were found in                            the sigmoid colon.                           Internal hemorrhoids were found during                            retroflexion. The hemorrhoids were small.                           The exam was otherwise without abnormality. Complications:            No immediate complications. Estimated blood loss:                            Minimal. Estimated Blood Loss:     Estimated blood loss was minimal. Impression:               - One  3 mm polyp at the hepatic flexure, removed                            with a cold snare. Resected and retrieved.                           - Three 3 mm polyps in the sigmoid colon, removed                            with a cold snare. Resected and retrieved.                           - Diverticulosis in the sigmoid colon.                           - Internal hemorrhoids.                           - The examination was otherwise normal. Recommendation:           - Patient has a contact number available for  emergencies. The signs and symptoms of potential                            delayed complications were discussed with the                            patient. Return to normal activities tomorrow.                            Written discharge instructions were provided to the                            patient.                           - Resume previous diet.                           - Continue present medications.                           - Await pathology results. Remo Lipps P. Tammie Meland, MD 02/07/2021 11:59:54 AM This report has been signed electronically.

## 2021-02-07 NOTE — Progress Notes (Signed)
Called to room to assist during endoscopic procedure.  Patient ID and intended procedure confirmed with present staff. Received instructions for my participation in the procedure from the performing physician.  

## 2021-02-07 NOTE — Progress Notes (Signed)
VS taken by VV

## 2021-02-07 NOTE — Telephone Encounter (Signed)
Care Partner left her standed. Very tearfully had to reschedule after she drank all the prep.

## 2021-02-08 ENCOUNTER — Ambulatory Visit (INDEPENDENT_AMBULATORY_CARE_PROVIDER_SITE_OTHER): Payer: Medicare Other | Admitting: *Deleted

## 2021-02-08 DIAGNOSIS — J4550 Severe persistent asthma, uncomplicated: Secondary | ICD-10-CM | POA: Diagnosis not present

## 2021-02-09 ENCOUNTER — Encounter: Payer: Self-pay | Admitting: Nurse Practitioner

## 2021-02-09 ENCOUNTER — Other Ambulatory Visit: Payer: Self-pay | Admitting: Nurse Practitioner

## 2021-02-09 ENCOUNTER — Telehealth: Payer: Self-pay

## 2021-02-09 ENCOUNTER — Ambulatory Visit (INDEPENDENT_AMBULATORY_CARE_PROVIDER_SITE_OTHER): Payer: Medicare Other | Admitting: Nurse Practitioner

## 2021-02-09 ENCOUNTER — Telehealth: Payer: Self-pay | Admitting: *Deleted

## 2021-02-09 ENCOUNTER — Other Ambulatory Visit: Payer: Self-pay

## 2021-02-09 VITALS — BP 120/78 | HR 74 | Temp 97.3°F | Ht <= 58 in | Wt 150.2 lb

## 2021-02-09 DIAGNOSIS — Z1159 Encounter for screening for other viral diseases: Secondary | ICD-10-CM | POA: Diagnosis not present

## 2021-02-09 DIAGNOSIS — E782 Mixed hyperlipidemia: Secondary | ICD-10-CM

## 2021-02-09 DIAGNOSIS — J3089 Other allergic rhinitis: Secondary | ICD-10-CM | POA: Diagnosis not present

## 2021-02-09 DIAGNOSIS — I10 Essential (primary) hypertension: Secondary | ICD-10-CM

## 2021-02-09 DIAGNOSIS — H6121 Impacted cerumen, right ear: Secondary | ICD-10-CM

## 2021-02-09 DIAGNOSIS — J4540 Moderate persistent asthma, uncomplicated: Secondary | ICD-10-CM | POA: Diagnosis not present

## 2021-02-09 DIAGNOSIS — E0865 Diabetes mellitus due to underlying condition with hyperglycemia: Secondary | ICD-10-CM

## 2021-02-09 DIAGNOSIS — Z0120 Encounter for dental examination and cleaning without abnormal findings: Secondary | ICD-10-CM | POA: Diagnosis not present

## 2021-02-09 NOTE — Telephone Encounter (Signed)
  Follow up Call-  Call back number 02/07/2021 11/30/2020  Post procedure Call Back phone  # [phone]  Permission to leave phone message Yes Yes  Some recent data might be hidden     Patient questions:  Do you have a fever, pain , or abdominal swelling? No. Pain Score  0 *  Have you tolerated food without any problems? Yes.    Have you been able to return to your normal activities? Yes.    Do you have any questions about your discharge instructions: Diet   No. Medications  No. Follow up visit  No.  Do you have questions or concerns about your Care? No.  Actions: * If pain score is 4 or above: No action needed, pain <4.

## 2021-02-09 NOTE — Telephone Encounter (Signed)
Attempted f/u phone call. No answer. Left message. °

## 2021-02-09 NOTE — Progress Notes (Signed)
Careteam: Patient Care Team: [name], NP as PCP - General (Geriatric Medicine) [name], MD as Consulting Physician (Ophthalmology) Margaretha Seeds, MD as Consulting Physician (Pulmonary Disease) Valentina Shaggy, MD as Consulting Physician (Allergy and Immunology)  PLACE OF SERVICE:  [place]  Advanced Directive information Does Patient Have a Medical Advance Directive?: No, Would patient like information on creating a medical advance directive?: No - Patient declined  Allergies  Allergen Reactions   Ace Inhibitors Other (See Comments)    Angioedema   Aspirin Anaphylaxis, Swelling and Other (See Comments)    Throat swelling    Doxepin Anaphylaxis   Ibuprofen Anaphylaxis, Swelling and Other (See Comments)    Throat swelling   Nsaids Anaphylaxis, Swelling and Other (See Comments)   Lipitor [Atorvastatin] Swelling and Other (See Comments)    Face, feet swell    Chief Complaint  Patient presents with   Medical Management of Chronic Issues    6 month follow up visit.   Health Maintenance    2nd COVID booster, Hemoglobin A1C     HPI: Patient is a 60 y.o. female for routine follow up.   Continues to work on diet due to diabetes diagnosis.  Walks and tries to do exercises around the house but has been limited.   Blood pressure controlled with amlodipine and bisoprolol.   Asthma/allergies- much better controlled on dupixent. Continues on zyrtec. Breathing has been much better.   Hyperlipidemia- continues on zetia   Due for next covid booster.   Review of Systems:  Review of Systems  Constitutional:  Negative for chills, fever and weight loss.  HENT:  Negative for tinnitus.   Respiratory:  Negative for cough, sputum production and shortness of breath.   Cardiovascular:  Negative for chest pain, palpitations and leg swelling.  Gastrointestinal:  Negative for abdominal pain, constipation, diarrhea and heartburn.  Genitourinary:   Negative for dysuria, frequency and urgency.  Musculoskeletal:  Negative for back pain, falls, joint pain and myalgias.  Skin: Negative.   Neurological:  Negative for dizziness and headaches.  Psychiatric/Behavioral:  Negative for depression and memory loss. The patient does not have insomnia.    Past Medical History:  Diagnosis Date   Allergy    Anaphylaxis to ASA   Anemia    Anxiety    Eczema    Environmental allergies    GERD (gastroesophageal reflux disease)    Patient denies   Headache(784.0)    Hx of adenomatous colonic polyps    Hyperlipidemia    Hypertension    Moderate intermittent asthma    Pre-diabetes    Respiratory failure, acute ([place]) 04/19/2014   Related to potential Anaphylaxis ? presumably to ASA - exacerbated by Moderate to Severe Asthma   Past Surgical History:  Procedure Laterality Date   [facility]    COLONOSCOPY     NASAL SINUS SURGERY     Polyps   POLYPECTOMY     TRANSTHORACIC ECHOCARDIOGRAM  04/2014   in setting of cardiopulmonary arrest): EF 65 to 70%.  Mild concentric LVH.  GR 1 DD.  Mild AI.;;  August 2019: EF 55 to 60%.  Mild LVH.  GR 1 DD.  Mild AI/MR.  Mildly increased PA pressures.   Social History:   reports that she has never smoked. She has never used smokeless tobacco. She reports that she does not drink alcohol and does not use drugs.  Family History  Problem Relation Age  of Onset   Heart attack Father    Heart attack Mother    Diabetes Sister    High blood pressure Brother    Hypertension Brother    Asthma Brother    High blood pressure Sister    Cancer Sister    Lung cancer Sister    High blood pressure Son    Cancer Maternal Grandmother    Heart disease Paternal Grandmother    Pancreatic cancer Paternal Grandmother    Asthma Other        Great paternal grandfather    Autism Son    Asthma Other        Neice    Colon cancer Neg Hx    Esophageal cancer Neg Hx    Rectal cancer Neg Hx     Stomach cancer Neg Hx    Allergic rhinitis Neg Hx    Angioedema Neg Hx    Eczema Neg Hx    Immunodeficiency Neg Hx    Atopy Neg Hx    Colon polyps Neg Hx     Medications: Patient's Medications  New Prescriptions   No medications on file  Previous Medications   ACETAMINOPHEN (TYLENOL) 160 MG/5ML SUSPENSION    Take by mouth every 6 (six) hours as needed.   ALBUTEROL (VENTOLIN HFA) 108 (90 BASE) MCG/ACT INHALER    INHALE 2 PUFFS INTO THE LUNGS EVERY 6 HOURS AS NEEDED FOR WHEEZING OR SHORTNESS OF BREATH   AMLODIPINE (NORVASC) 10 MG TABLET    TAKE 1 TABLET BY MOUTH EVERY DAY FOR HIGH BLOOD PRESSURE   BISOPROLOL (ZEBETA) 5 MG TABLET    TAKE 1/2 TABLET BY MOUTH EVERY DAY FOR HIGH BLOOD PRESSURE   CETIRIZINE (ZYRTEC) 10 MG TABLET    TAKE 1 TABLET(10 MG) BY MOUTH DAILY   EPINEPHRINE 0.3 MG/0.3 ML IJ SOAJ INJECTION       EZETIMIBE (ZETIA) 10 MG TABLET    TAKE 1 TABLET(10 MG) BY MOUTH DAILY   FLUTICASONE (FLONASE) 50 MCG/ACT NASAL SPRAY    SHAKE LIQUID AND USE 2 SPRAYS IN EACH NOSTRIL DAILY   FLUTICASONE-SALMETEROL (WIXELA INHUB) 500-50 MCG/DOSE AEPB    Inhale 1 puff into the lungs in the morning and at bedtime.   HYDRALAZINE (APRESOLINE) 25 MG TABLET    Take 1 tablet (25 mg total) by mouth every 8 (eight) hours.   OMEGA-3 FATTY ACIDS (FISH OIL) 500 MG CAPS    Take 1 capsule by mouth daily.   TRIAMCINOLONE OINTMENT (KENALOG) 0.5 %    APPLY TOPICALLY TWICE DAILY  Modified Medications   No medications on file  Discontinued Medications   NUCALA 100 MG/ML SOSY    Inject into the skin.    Physical Exam:  Vitals:   02/09/21 0918  BP: 120/78  Pulse: 74  Temp: (!) 97.3 F (36.3 C)  TempSrc: Temporal  SpO2: 98%  Weight: 150 lb 3.2 oz (68.1 kg)  Height: 4\' 9"  (1.448 m)   Body mass index is 32.5 kg/m. Wt Readings from Last 3 Encounters:  02/09/21 150 lb 3.2 oz (68.1 kg)  02/07/21 153 lb (69.4 kg)  01/23/21 153 lb (69.4 kg)    Physical Exam Constitutional:      General: She is not  in acute distress.    Appearance: She is well-developed. She is not diaphoretic.  HENT:     Head: Normocephalic and atraumatic.     Mouth/Throat:     Pharynx: No oropharyngeal exudate.  Eyes:     Conjunctiva/sclera: Conjunctivae  normal.     Pupils: Pupils are equal, round, and reactive to light.  Cardiovascular:     Rate and Rhythm: Normal rate and regular rhythm.     Heart sounds: Normal heart sounds.  Pulmonary:     Effort: Pulmonary effort is normal.     Breath sounds: Normal breath sounds.  Abdominal:     General: Bowel sounds are normal.     Palpations: Abdomen is soft.  Musculoskeletal:     Cervical back: Normal range of motion and neck supple.     Right lower leg: No edema.     Left lower leg: No edema.  Skin:    General: Skin is warm and dry.  Neurological:     Mental Status: She is alert.  Psychiatric:        Mood and Affect: Mood normal.    Labs reviewed: Basic Metabolic Panel: Recent Labs    08/11/20 1036  NA 143  K 3.9  CL 107  CO2 28  GLUCOSE 94  BUN 9  CREATININE 0.89  CALCIUM 9.5   Liver Function Tests: Recent Labs    08/11/20 1036  AST 19  ALT 21  BILITOT 0.8  PROT 7.2   No results for input(s): LIPASE, AMYLASE in the last 8760 hours. No results for input(s): AMMONIA in the last 8760 hours. CBC: Recent Labs    08/11/20 1036  WBC 5.9  NEUTROABS 2,938  HGB 13.8  HCT 42.0  MCV 83.0  PLT 327   Lipid Panel: Recent Labs    08/11/20 1036  CHOL 174  HDL 43*  LDLCALC 101*  TRIG 180*  CHOLHDL 4.0   TSH: No results for input(s): TSH in the last 8760 hours. A1C: Lab Results  Component Value Date   HGBA1C 6.0 (H) 08/11/2020     Assessment/Plan 1. Need for assessment by dentistry for poor dentition -poor dentition needing dental care.  - Ambulatory referral to Dentistry  2. Need for hepatitis C screening test - Hepatitis C antibody  3. Diabetes mellitus due to underlying condition with hyperglycemia, without long-term  current use of insulin ([place]) -Encouraged dietary compliance, routine foot care/monitoring and to keep up with diabetic eye exams through ophthalmology. - Hemoglobin A1c - Amb ref to Medical Nutrition Therapy-MNT  4. Essential hypertension -well controlled on current regimen.  - COMPLETE METABOLIC PANEL WITH GFR - CBC with Differential/Platelet  5. Non-seasonal allergic rhinitis, unspecified trigger - stable on dupixent injection with flonase  6. Moderate persistent asthma without complication -improved control on dupixent injection with flonase, fluticasone-salmeterol inhaler  7. Mixed hyperlipidemia -continue dietary modifications - COMPLETE METABOLIC PANEL WITH GFR - Lipid Panel  8. Impacted cerumen of right ear Flushed but impaction remains- encouraged to use debrox and follow up in 4 days for ear lavage   Next appt: 6 months. Carlos American. [place], [facility] [phone]

## 2021-02-12 LAB — COMPLETE METABOLIC PANEL WITH GFR
AG Ratio: 1.3 (calc) (ref 1.0–2.5)
ALT: 19 U/L (ref 6–29)
AST: 22 U/L (ref 10–35)
Albumin: 4 g/dL (ref 3.6–5.1)
Alkaline phosphatase (APISO): 89 U/L (ref 37–153)
BUN: 9 mg/dL (ref 7–25)
CO2: 27 mmol/L (ref 20–32)
Calcium: 9.4 mg/dL (ref 8.6–10.4)
Chloride: 107 mmol/L (ref 98–110)
Creat: 0.93 mg/dL (ref 0.50–0.99)
GFR, Est African American: 76 mL/min/{1.73_m2} (ref >=60)
GFR, Est Non African American: 66 mL/min/{1.73_m2} (ref >=60)
Globulin: 3.1 g/dL (calc) (ref 1.9–3.7)
Glucose, Bld: 79 mg/dL (ref 65–99)
Potassium: 3.3 mmol/L — ABNORMAL LOW (ref 3.5–5.3)
Sodium: 142 mmol/L (ref 135–146)
Total Bilirubin: 0.9 mg/dL (ref 0.2–1.2)
Total Protein: 7.1 g/dL (ref 6.1–8.1)

## 2021-02-12 LAB — CBC WITH DIFFERENTIAL/PLATELET
Absolute Monocytes: 632 cells/uL (ref 200–950)
Basophils Absolute: 122 cells/uL (ref 0–200)
Basophils Relative: 1.8 %
Eosinophils Absolute: 551 cells/uL — ABNORMAL HIGH (ref 15–500)
Eosinophils Relative: 8.1 %
HCT: 40.9 % (ref 35.0–45.0)
Hemoglobin: 13.4 g/dL (ref 11.7–15.5)
Lymphs Abs: 2509 cells/uL (ref 850–3900)
MCH: 26.9 pg — ABNORMAL LOW (ref 27.0–33.0)
MCHC: 32.8 g/dL (ref 32.0–36.0)
MCV: 82.1 fL (ref 80.0–100.0)
MPV: 10.1 fL (ref 7.5–12.5)
Monocytes Relative: 9.3 %
Neutro Abs: 2985 cells/uL (ref 1500–7800)
Neutrophils Relative %: 43.9 %
Platelets: 308 10*3/uL (ref 140–400)
RBC: 4.98 10*6/uL (ref 3.80–5.10)
RDW: 13.1 % (ref 11.0–15.0)
Total Lymphocyte: 36.9 %
WBC: 6.8 10*3/uL (ref 3.8–10.8)

## 2021-02-12 LAB — LIPID PANEL
Cholesterol: 118 mg/dL (ref <200)
HDL: 37 mg/dL — ABNORMAL LOW (ref >=50)
LDL Cholesterol (Calc): 59 mg/dL (calc)
Non-HDL Cholesterol (Calc): 81 mg/dL (calc) (ref <130)
Total CHOL/HDL Ratio: 3.2 (calc) (ref <5.0)
Triglycerides: 135 mg/dL (ref <150)

## 2021-02-12 LAB — HEMOGLOBIN A1C
Hgb A1c MFr Bld: 5.7 % of total Hgb — ABNORMAL HIGH (ref <5.7)
Mean Plasma Glucose: 117 mg/dL
eAG (mmol/L): 6.5 mmol/L

## 2021-02-12 LAB — HEPATITIS C ANTIBODY
Hepatitis C Ab: NONREACTIVE
SIGNAL TO CUT-OFF: 0.01 (ref <1.00)

## 2021-02-15 ENCOUNTER — Encounter: Payer: Self-pay | Admitting: Gastroenterology

## 2021-02-16 ENCOUNTER — Encounter: Payer: Medicare Other | Admitting: Gastroenterology

## 2021-02-22 ENCOUNTER — Other Ambulatory Visit: Payer: Self-pay

## 2021-02-22 ENCOUNTER — Ambulatory Visit (INDEPENDENT_AMBULATORY_CARE_PROVIDER_SITE_OTHER): Payer: Medicare Other | Admitting: *Deleted

## 2021-02-22 DIAGNOSIS — J4550 Severe persistent asthma, uncomplicated: Secondary | ICD-10-CM | POA: Diagnosis not present

## 2021-03-08 ENCOUNTER — Ambulatory Visit (INDEPENDENT_AMBULATORY_CARE_PROVIDER_SITE_OTHER): Payer: Medicare Other

## 2021-03-08 ENCOUNTER — Other Ambulatory Visit: Payer: Self-pay

## 2021-03-08 DIAGNOSIS — J4550 Severe persistent asthma, uncomplicated: Secondary | ICD-10-CM

## 2021-03-20 ENCOUNTER — Ambulatory Visit (INDEPENDENT_AMBULATORY_CARE_PROVIDER_SITE_OTHER): Payer: Medicare Other | Admitting: Nurse Practitioner

## 2021-03-20 ENCOUNTER — Telehealth: Payer: Self-pay

## 2021-03-20 ENCOUNTER — Other Ambulatory Visit: Payer: Self-pay

## 2021-03-20 ENCOUNTER — Encounter: Payer: Self-pay | Admitting: Nurse Practitioner

## 2021-03-20 DIAGNOSIS — Z1231 Encounter for screening mammogram for malignant neoplasm of breast: Secondary | ICD-10-CM | POA: Diagnosis not present

## 2021-03-20 DIAGNOSIS — Z0000 Encounter for general adult medical examination without abnormal findings: Secondary | ICD-10-CM

## 2021-03-20 NOTE — Telephone Encounter (Signed)
[name], Tammie [name] are scheduled for a virtual visit with your provider today.    Just as we do with appointments in the office, we must obtain your consent to participate.  Your consent will be active for this visit and any virtual visit you may have with one of our providers in the next 365 days.    If you have a MyChart account, I can also send a copy of this consent to you electronically.  All virtual visits are billed to your insurance company just like a traditional visit in the office.  As this is a virtual visit, video technology does not allow for your provider to perform a traditional examination.  This may limit your provider's ability to fully assess your condition.  If your provider identifies any concerns that need to be evaluated in person or the need to arrange testing such as labs, EKG, etc, we will make arrangements to do so.    Although advances in technology are sophisticated, we cannot ensure that it will always work on either your end or our end.  If the connection with a video visit is poor, we may have to switch to a telephone visit.  With either a video or telephone visit, we are not always able to ensure that we have a secure connection.   I need to obtain your verbal consent now.   Are you willing to proceed with your visit today?   Tammie [name] has provided verbal consent on 03/20/2021 for a virtual visit (video or telephone).   Carroll Kinds, CMA 03/20/2021  1:23 PM

## 2021-03-20 NOTE — Progress Notes (Signed)
Subjective:   Tammie [name] is a 60 y.o. female who presents for Medicare Annual (Subsequent) preventive examination.  Review of Systems     Cardiac Risk Factors include: advanced age (>21mn, >>67women);obesity (BMI >30kg/m2);hypertension;dyslipidemia;diabetes mellitus;family history of premature cardiovascular disease     Objective:    Today's Vitals   03/20/21 1337  PainSc: 2    There is no height or weight on file to calculate BMI.  Advanced Directives 03/20/2021 02/09/2021 08/11/2020 05/10/2020 03/14/2020 03/12/2019 03/12/2019  Does Patient Have a Medical Advance Directive? No No No No No No No  Would patient like information on creating a medical advance directive? - No - Patient declined Yes (MAU/Ambulatory/Procedural Areas - Information given) Yes (MAU/Ambulatory/Procedural Areas - Information given) - - -    Current Medications (verified) Outpatient Encounter Medications as of 03/20/2021  Medication Sig   acetaminophen (TYLENOL) 160 MG/5ML suspension Take by mouth every 6 (six) hours as needed.   albuterol (VENTOLIN HFA) 108 (90 Base) MCG/ACT inhaler INHALE 2 PUFFS INTO THE LUNGS EVERY 6 HOURS AS NEEDED FOR WHEEZING OR SHORTNESS OF BREATH   amLODipine (NORVASC) 10 MG tablet TAKE 1 TABLET BY MOUTH EVERY DAY FOR HIGH BLOOD PRESSURE   bisoprolol (ZEBETA) 5 MG tablet TAKE 1/2 TABLET BY MOUTH EVERY DAY FOR HIGH BLOOD PRESSURE   cetirizine (ZYRTEC) 10 MG tablet TAKE 1 TABLET(10 MG) BY MOUTH DAILY   EPINEPHrine 0.3 mg/0.3 mL IJ SOAJ injection    ezetimibe (ZETIA) 10 MG tablet TAKE 1 TABLET(10 MG) BY MOUTH DAILY   fluticasone (FLONASE) 50 MCG/ACT nasal spray SHAKE LIQUID AND USE 2 SPRAYS IN EACH NOSTRIL DAILY   Fluticasone-Salmeterol (WIXELA INHUB) 500-50 MCG/DOSE AEPB Inhale 1 puff into the lungs in the morning and at bedtime.   hydrALAZINE (APRESOLINE) 25 MG tablet TAKE 1 TABLET(25 MG) BY MOUTH EVERY 8 HOURS   Omega-3 Fatty Acids (FISH OIL) 500 MG CAPS Take 1 capsule by mouth daily.    triamcinolone ointment (KENALOG) 0.5 % APPLY TOPICALLY TWICE DAILY   Facility-Administered Encounter Medications as of 03/20/2021  Medication   dupilumab (DUPIXENT) prefilled syringe 300 mg   Mepolizumab SOLR 100 mg    Allergies (verified) Ace inhibitors, Aspirin, Doxepin, Ibuprofen, Nsaids, and Lipitor [atorvastatin]   History: Past Medical History:  Diagnosis Date   Allergy    Anaphylaxis to ASA   Anemia    Anxiety    Eczema    Environmental allergies    GERD (gastroesophageal reflux disease)    Patient denies   Headache(784.0)    Hx of adenomatous colonic polyps    Hyperlipidemia    Hypertension    Moderate intermittent asthma    Pre-diabetes    [place], acute [place] 04/19/2014   Related to potential Anaphylaxis ? presumably to ASA - exacerbated by Moderate to Severe Asthma   Past Surgical History:  Procedure Laterality Date   [facility]    COLONOSCOPY     NASAL SINUS SURGERY     Polyps   POLYPECTOMY     TRANSTHORACIC ECHOCARDIOGRAM  04/2014   in setting of cardiopulmonary arrest): EF 65 to 70%.  Mild concentric LVH.  GR 1 DD.  Mild AI.;;  August 2019: EF 55 to 60%.  Mild LVH.  GR 1 DD.  Mild AI/MR.  Mildly increased PA pressures.   Family History  Problem Relation Age of Onset   Heart attack Father    Heart attack Mother    Diabetes Sister  High blood pressure Brother    Hypertension Brother    Asthma Brother    High blood pressure Sister    Cancer Sister    Lung cancer Sister    High blood pressure Son    Cancer Maternal Grandmother    Heart disease Paternal Grandmother    Pancreatic cancer Paternal Grandmother    Asthma Other        Great paternal grandfather    Autism Son    Asthma Other        Neice    Colon cancer Neg Hx    Esophageal cancer Neg Hx    Rectal cancer Neg Hx    Stomach cancer Neg Hx    Allergic rhinitis Neg Hx    Angioedema Neg Hx    Eczema Neg Hx    Immunodeficiency Neg Hx     Atopy Neg Hx    Colon polyps Neg Hx    Social History   Socioeconomic History   Marital status: Single    Spouse name: Not on file   Number of children: Not on file   Years of education: Not on file   Highest education level: Not on file  Occupational History   Not on file  Tobacco Use   Smoking status: Never   Smokeless tobacco: Never  Vaping Use   Vaping Use: Never used  Substance and Sexual Activity   Alcohol use: No   Drug use: No   Sexual activity: Not Currently  Other Topics Concern   Not on file  Social History Narrative   Diet-Salt Free   Drinks Soda-Caffeine    Lives in an apartment, one stories, one person in home, no pets   Current/past profession: Dietary, dish-room, and cooks    Exercises 2 x weekly   Social Determinants of Health   Financial Resource Strain: Not on file  Food Insecurity: Not on file  Transportation Needs: Not on file  Physical Activity: Not on file  Stress: Not on file  Social Connections: Not on file    Tobacco Counseling Counseling given: Not Answered   Clinical Intake:  Pre-visit preparation completed: Yes  Pain : 0-10 Pain Score: 2  Pain Type: Chronic pain Pain Location: Toe (Comment which one) Pain Orientation: Right Pain Descriptors / Indicators: Constant, Aching Pain Onset: More than a month ago Pain Frequency: Intermittent     BMI - recorded: 32 Nutritional Status: BMI > 30  Obese Diabetes: Yes  How often do you need to have someone help you when you read instructions, pamphlets, or other written materials from your doctor or pharmacy?: 1 - Never  Diabetic?yes         Activities of Daily Living In your present state of health, do you have any difficulty performing the following activities: 03/20/2021  Hearing? N  Vision? Y  Difficulty concentrating or making decisions? N  Walking or climbing stairs? N  Dressing or bathing? N  Doing errands, shopping? N  Preparing Food and eating ? N  Using the  Toilet? N  In the past six months, have you accidently leaked urine? N  Do you have problems with loss of bowel control? N  Managing your Medications? N  Managing your Finances? N  Housekeeping or managing your Housekeeping? N  Some recent data might be hidden    Patient Care Team: [name], NP as PCP - General (Geriatric Medicine) [name], MD as Consulting Physician (Ophthalmology) Margaretha Seeds, MD as Consulting Physician (Pulmonary  Disease) Valentina Shaggy, MD as Consulting Physician (Allergy and Immunology)  Indicate any recent Medical Services you may have received from other than [facility] providers in the past year (date may be approximate).     Assessment:   This is a routine wellness examination for Wilena.  Hearing/Vision screen Hearing Screening - Comments:: Patient has no hearing problems. Vision Screening - Comments:: Patient has some vision problems.Waiting for eye appointment. Patient sees Dr. Katy Fitch  Dietary issues and exercise activities discussed: Current Exercise Habits: The patient does not participate in regular exercise at present   Goals Addressed   None    Depression Screen PHQ 2/9 Scores 03/20/2021 08/11/2020 05/10/2020 03/14/2020 07/12/2019 03/12/2019 03/10/2018  PHQ - 2 Score 0 0 0 0 0 0 0    Fall Risk Fall Risk  03/20/2021 08/11/2020 05/10/2020 03/14/2020 02/09/2020  Falls in the past year? 0 0 0 0 0  Number falls in past yr: 0 0 0 0 -  Injury with Fall? 0 0 0 0 -  Comment - - - - -  Risk for fall due to : No Fall Risks - - - -  Follow up Falls evaluation completed - - - -    FALL RISK PREVENTION PERTAINING TO THE HOME:  Any stairs in or around the home? No  If so, are there any without handrails? No  Home free of loose throw rugs in walkways, pet beds, electrical cords, etc? Yes  Adequate lighting in your home to reduce risk of falls? Yes   ASSISTIVE DEVICES UTILIZED TO PREVENT FALLS:  Life alert? No  Use of a cane, walker  or w/c? No  Grab bars in the bathroom? No  Shower chair or bench in shower? Yes  Elevated toilet seat or a handicapped toilet? No   TIMED UP AND GO:  Was the test performed? No .    Cognitive Function: MMSE - Mini Mental State Exam 02/07/2017  Not completed: (No Data)     6CIT Screen 03/20/2021 03/14/2020  What Year? 0 points 0 points  What month? 0 points 0 points  What time? 0 points 0 points  Count back from 20 0 points 0 points  Months in reverse 2 points 0 points  Repeat phrase 4 points 4 points  Total Score 6 4    Immunizations Immunization History  Administered Date(s) Administered   Fluad Quad(high Dose 65+) 05/10/2020   Influenza Inj Mdck Quad Pf 06/29/2019   Influenza,inj,Quad PF,6+ Mos 05/26/2014, 06/21/2016, 05/27/2017, 05/19/2018, 03/30/2019   Influenza-Unspecified 05/06/2015, 05/23/2017   PFIZER(Purple Top)SARS-COV-2 Vaccination 10/21/2019, 11/16/2019, 06/23/2020, 02/14/2021   Pneumococcal Conjugate-13 12/17/2016   Pneumococcal Polysaccharide-23 03/12/2019, 06/29/2019   Tdap 07/22/2017   Zoster Recombinat (Shingrix) 12/17/2016, 02/19/2017    TDAP status: Up to date  Flu Vaccine status: Due, Education has been provided regarding the importance of this vaccine. Advised may receive this vaccine at local pharmacy or Health Dept. Aware to provide a copy of the vaccination record if obtained from local pharmacy or Health Dept. Verbalized acceptance and understanding.  Pneumococcal vaccine status: Up to date  Covid-19 vaccine status: Completed vaccines  Qualifies for Shingles Vaccine? Yes   Zostavax completed Yes   Shingrix Completed?: Yes  Screening Tests Health Maintenance  Topic Date Due   INFLUENZA VACCINE  03/05/2021   OPHTHALMOLOGY EXAM  04/20/2021   MAMMOGRAM  05/09/2021   FOOT EXAM  08/11/2021   HEMOGLOBIN A1C  08/12/2021   PAP SMEAR-Modifier  03/11/2022   COLONOSCOPY (Pts 45-78yr Insurance  coverage will need to be confirmed)  02/07/2026    TETANUS/TDAP  07/23/2027   PNEUMOCOCCAL POLYSACCHARIDE VACCINE AGE 34-64 HIGH RISK  Completed   COVID-19 Vaccine  Completed   HIV Screening  Completed   Zoster Vaccines- Shingrix  Completed   Pneumococcal Vaccine 23-20 Years old  Aged Out   HPV VACCINES  Aged Out   Hepatitis C Screening  Discontinued    Health Maintenance  Health Maintenance Due  Topic Date Due   INFLUENZA VACCINE  03/05/2021    Colorectal cancer screening: Type of screening: Colonoscopy. Completed 02/2021. Repeat every 5 years  Mammogram status: Completed 05/10/2019. Repeat every year   Lung Cancer Screening: (Low Dose CT Chest recommended if Age 49-80 years, 30 pack-year currently smoking OR have quit w/in 15years.) does not qualify.   Lung Cancer Screening Referral:  Additional Screening:  Hepatitis C Screening: does qualify; Completed   Vision Screening: Recommended annual ophthalmology exams for early detection of glaucoma and other disorders of the eye. Is the patient up to date with their annual eye exam?  No  Who is the provider or what is the name of the office in which the patient attends annual eye exams? Dr Katy Fitch If pt is not established with a provider, would they like to be referred to a provider to establish care? No .   Dental Screening: Recommended annual dental exams for proper oral hygiene  Community Resource Referral / Chronic Care Management: CRR required this visit?  No   CCM required this visit?  No      Plan:     I have personally reviewed and noted the following in the patient's chart:   Medical and social history Use of alcohol, tobacco or illicit drugs  Current medications and supplements including opioid prescriptions.  Functional ability and status Nutritional status Physical activity Advanced directives List of other physicians Hospitalizations, surgeries, and ER visits in previous 12 months Vitals Screenings to include cognitive, depression, and falls Referrals  and appointments  In addition, I have reviewed and discussed with patient certain preventive protocols, quality metrics, and best practice recommendations. A written personalized care plan for preventive services as well as general preventive health recommendations were provided to patient.     [name], NP   03/20/2021   Virtual Visit via Telephone Note  I connected withNAME@ on 03/20/21 at  1:00 PM EDT by telephone and verified that I am speaking with the correct person using two identifiers.  Location: Patient: home Provider: [facility]   I discussed the limitations, risks, security and privacy concerns of performing an evaluation and management service by telephone and the availability of in person appointments. I also discussed with the patient that there may be a patient responsible charge related to this service. The patient expressed understanding and agreed to proceed.   I discussed the assessment and treatment plan with the patient. The patient was provided an opportunity to ask questions and all were answered. The patient agreed with the plan and demonstrated an understanding of the instructions.   The patient was advised to call back or seek an in-person evaluation if the symptoms worsen or if the condition fails to improve as anticipated.  I provided 18 minutes of non-face-to-face time during this encounter.  Carlos American. Harle [facility] printed and mailed

## 2021-03-20 NOTE — Progress Notes (Signed)
This service is provided via telemedicine  No vital signs collected/recorded due to the encounter was a telemedicine visit.   Location of patient (ex: home, work):  Home  Patient consents to a telephone visit:  Yes, see encounter dated 03/20/2021  Location of the provider (ex: office, home):  [place]  Name of any referring provider:  N/A  Names of all persons participating in the telemedicine service and their role in the encounter:  Sherrie Mustache, Nurse Practitioner, Carroll Kinds, CMA, and patient.   Time spent on call:  13 minutes with medical assistant

## 2021-03-20 NOTE — Patient Instructions (Addendum)
Tammie [name] , Thank you for taking time to come for your Medicare Wellness Visit. I appreciate your ongoing commitment to your health goals. Please review the following plan we discussed and let me know if I can assist you in the future.   Screening recommendations/referrals: Colonoscopy up to date Mammogram due at this time- call [phone] to schedule Bone Density- due at 50 Recommended yearly ophthalmology/optometry visit for glaucoma screening and checkup Recommended yearly dental visit for hygiene and checkup  Vaccinations: Influenza vaccine recommended to get yearly Pneumococcal vaccine up to date Tdap vaccine up to date Shingles vaccine up to date    Call to schedule eye exam   Advanced directives: recommended to complete and bring to office to put on file.   Conditions/risks identified: obesity, family hx of heart disease, diabetes  Next appointment: 1 year for AWV   Preventive Care 19 Years and Older, Female Preventive care refers to lifestyle choices and visits with your health care provider that can promote health and wellness. What does preventive care include? A yearly physical exam. This is also called an annual well check. Dental exams once or twice a year. Routine eye exams. Ask your health care provider how often you should have your eyes checked. Personal lifestyle choices, including: Daily care of your teeth and gums. Regular physical activity. Eating a healthy diet. Avoiding tobacco and drug use. Limiting alcohol use. Practicing safe sex. Taking low-dose aspirin every day. Taking vitamin and mineral supplements as recommended by your health care provider. What happens during an annual well check? The services and screenings done by your health care provider during your annual well check will depend on your age, overall health, lifestyle risk factors, and family history of disease. Counseling  Your health care provider may ask you questions about  your: Alcohol use. Tobacco use. Drug use. Emotional well-being. Home and relationship well-being. Sexual activity. Eating habits. History of falls. Memory and ability to understand (cognition). Work and work Statistician. Reproductive health. Screening  You may have the following tests or measurements: Height, weight, and BMI. Blood pressure. Lipid and cholesterol levels. These may be checked every 5 years, or more frequently if you are over 12 years old. Skin check. Lung cancer screening. You may have this screening every year starting at age 78 if you have a 30-pack-year history of smoking and currently smoke or have quit within the past 15 years. Fecal occult blood test (FOBT) of the stool. You may have this test every year starting at age 68. Flexible sigmoidoscopy or colonoscopy. You may have a sigmoidoscopy every 5 years or a colonoscopy every 10 years starting at age 25. Hepatitis C blood test. Hepatitis B blood test. Sexually transmitted disease (STD) testing. Diabetes screening. This is done by checking your blood sugar (glucose) after you have not eaten for a while (fasting). You may have this done every 1-3 years. Bone density scan. This is done to screen for osteoporosis. You may have this done starting at age 34. Mammogram. This may be done every 1-2 years. Talk to your health care provider about how often you should have regular mammograms. Talk with your health care provider about your test results, treatment options, and if necessary, the need for more tests. Vaccines  Your health care provider may recommend certain vaccines, such as: Influenza vaccine. This is recommended every year. Tetanus, diphtheria, and acellular pertussis (Tdap, Td) vaccine. You may need a Td booster every 10 years. Zoster vaccine. You may need this after age  60. Pneumococcal 13-valent conjugate (PCV13) vaccine. One dose is recommended after age 58. Pneumococcal polysaccharide (PPSV23) vaccine.  One dose is recommended after age 96. Talk to your health care provider about which screenings and vaccines you need and how often you need them. This information is not intended to replace advice given to you by your health care provider. Make sure you discuss any questions you have with your health care provider. Document Released: 08/18/2015 Document Revised: 04/10/2016 Document Reviewed: 05/23/2015 Elsevier Interactive Patient Education  2017 [place] Prevention in the Home Falls can cause injuries. They can happen to people of all ages. There are many things you can do to make your home safe and to help prevent falls. What can I do on the outside of my home? Regularly fix the edges of walkways and driveways and fix any cracks. Remove anything that might make you trip as you walk through a door, such as a raised step or threshold. Trim any bushes or trees on the path to your home. Use bright outdoor lighting. Clear any walking paths of anything that might make someone trip, such as rocks or tools. Regularly check to see if handrails are loose or broken. Make sure that both sides of any steps have handrails. Any raised decks and porches should have guardrails on the edges. Have any leaves, snow, or ice cleared regularly. Use sand or salt on walking paths during winter. Clean up any spills in your garage right away. This includes oil or grease spills. What can I do in the bathroom? Use night lights. Install grab bars by the toilet and in the tub and shower. Do not use towel bars as grab bars. Use non-skid mats or decals in the tub or shower. If you need to sit down in the shower, use a plastic, non-slip stool. Keep the floor dry. Clean up any water that spills on the floor as soon as it happens. Remove soap buildup in the tub or shower regularly. Attach bath mats securely with double-sided non-slip rug tape. Do not have throw rugs and other things on the floor that can make  you trip. What can I do in the bedroom? Use night lights. Make sure that you have a light by your bed that is easy to reach. Do not use any sheets or blankets that are too big for your bed. They should not hang down onto the floor. Have a firm chair that has side arms. You can use this for support while you get dressed. Do not have throw rugs and other things on the floor that can make you trip. What can I do in the kitchen? Clean up any spills right away. Avoid walking on wet floors. Keep items that you use a lot in easy-to-reach places. If you need to reach something above you, use a strong step stool that has a grab bar. Keep electrical cords out of the way. Do not use floor polish or wax that makes floors slippery. If you must use wax, use non-skid floor wax. Do not have throw rugs and other things on the floor that can make you trip. What can I do with my stairs? Do not leave any items on the stairs. Make sure that there are handrails on both sides of the stairs and use them. Fix handrails that are broken or loose. Make sure that handrails are as long as the stairways. Check any carpeting to make sure that it is firmly attached to the stairs. Fix  any carpet that is loose or worn. Avoid having throw rugs at the top or bottom of the stairs. If you do have throw rugs, attach them to the floor with carpet tape. Make sure that you have a light switch at the top of the stairs and the bottom of the stairs. If you do not have them, ask someone to add them for you. What else can I do to help prevent falls? Wear shoes that: Do not have high heels. Have rubber bottoms. Are comfortable and fit you well. Are closed at the toe. Do not wear sandals. If you use a stepladder: Make sure that it is fully opened. Do not climb a closed stepladder. Make sure that both sides of the stepladder are locked into place. Ask someone to hold it for you, if possible. Clearly mark and make sure that you can  see: Any grab bars or handrails. First and last steps. Where the edge of each step is. Use tools that help you move around (mobility aids) if they are needed. These include: Canes. Walkers. Scooters. Crutches. Turn on the lights when you go into a dark area. Replace any light bulbs as soon as they burn out. Set up your furniture so you have a clear path. Avoid moving your furniture around. If any of your floors are uneven, fix them. If there are any pets around you, be aware of where they are. Review your medicines with your doctor. Some medicines can make you feel dizzy. This can increase your chance of falling. Ask your doctor what other things that you can do to help prevent falls. This information is not intended to replace advice given to you by your health care provider. Make sure you discuss any questions you have with your health care provider. Document Released: 05/18/2009 Document Revised: 12/28/2015 Document Reviewed: 08/26/2014 Elsevier Interactive Patient Education  2017 [employer].

## 2021-03-22 ENCOUNTER — Ambulatory Visit (INDEPENDENT_AMBULATORY_CARE_PROVIDER_SITE_OTHER): Payer: Medicare Other | Admitting: *Deleted

## 2021-03-22 ENCOUNTER — Other Ambulatory Visit: Payer: Self-pay

## 2021-03-22 DIAGNOSIS — J4550 Severe persistent asthma, uncomplicated: Secondary | ICD-10-CM | POA: Diagnosis not present

## 2021-04-05 ENCOUNTER — Ambulatory Visit (INDEPENDENT_AMBULATORY_CARE_PROVIDER_SITE_OTHER): Payer: Medicare Other | Admitting: *Deleted

## 2021-04-05 ENCOUNTER — Other Ambulatory Visit: Payer: Self-pay

## 2021-04-05 DIAGNOSIS — J4550 Severe persistent asthma, uncomplicated: Secondary | ICD-10-CM | POA: Diagnosis not present

## 2021-04-19 ENCOUNTER — Other Ambulatory Visit: Payer: Self-pay

## 2021-04-19 ENCOUNTER — Ambulatory Visit (INDEPENDENT_AMBULATORY_CARE_PROVIDER_SITE_OTHER): Payer: Medicare Other | Admitting: *Deleted

## 2021-04-19 DIAGNOSIS — J4550 Severe persistent asthma, uncomplicated: Secondary | ICD-10-CM | POA: Diagnosis not present

## 2021-04-24 ENCOUNTER — Other Ambulatory Visit: Payer: Self-pay | Admitting: Nurse Practitioner

## 2021-04-24 DIAGNOSIS — N6320 Unspecified lump in the left breast, unspecified quadrant: Secondary | ICD-10-CM

## 2021-04-24 DIAGNOSIS — N6310 Unspecified lump in the right breast, unspecified quadrant: Secondary | ICD-10-CM

## 2021-05-03 ENCOUNTER — Other Ambulatory Visit: Payer: Self-pay

## 2021-05-03 ENCOUNTER — Ambulatory Visit (INDEPENDENT_AMBULATORY_CARE_PROVIDER_SITE_OTHER): Payer: Medicare Other

## 2021-05-03 DIAGNOSIS — J4550 Severe persistent asthma, uncomplicated: Secondary | ICD-10-CM

## 2021-05-17 ENCOUNTER — Ambulatory Visit (INDEPENDENT_AMBULATORY_CARE_PROVIDER_SITE_OTHER): Payer: Medicare Other | Admitting: *Deleted

## 2021-05-17 ENCOUNTER — Other Ambulatory Visit: Payer: Self-pay

## 2021-05-17 DIAGNOSIS — J4550 Severe persistent asthma, uncomplicated: Secondary | ICD-10-CM

## 2021-05-21 ENCOUNTER — Other Ambulatory Visit: Payer: Self-pay | Admitting: Allergy & Immunology

## 2021-05-21 ENCOUNTER — Other Ambulatory Visit: Payer: Self-pay | Admitting: Nurse Practitioner

## 2021-05-21 DIAGNOSIS — I10 Essential (primary) hypertension: Secondary | ICD-10-CM

## 2021-05-21 NOTE — Telephone Encounter (Signed)
Called and left a detailed message for patient informing her that she needed to schedule an appointment with Dr. Ernst Bowler to continue receiving refills.

## 2021-05-22 ENCOUNTER — Other Ambulatory Visit: Payer: Self-pay | Admitting: Nurse Practitioner

## 2021-05-22 DIAGNOSIS — I10 Essential (primary) hypertension: Secondary | ICD-10-CM

## 2021-05-30 ENCOUNTER — Other Ambulatory Visit: Payer: Self-pay

## 2021-05-30 ENCOUNTER — Ambulatory Visit
Admission: RE | Admit: 2021-05-30 | Discharge: 2021-05-30 | Disposition: A | Discharge status: Home / Self Care | Payer: Medicare Other | Source: Ambulatory Visit | Attending: Nurse Practitioner | Admitting: Nurse Practitioner

## 2021-05-30 DIAGNOSIS — N6320 Unspecified lump in the left breast, unspecified quadrant: Secondary | ICD-10-CM

## 2021-05-30 DIAGNOSIS — N6312 Unspecified lump in the right breast, upper inner quadrant: Secondary | ICD-10-CM | POA: Diagnosis not present

## 2021-05-30 DIAGNOSIS — R922 Inconclusive mammogram: Secondary | ICD-10-CM | POA: Diagnosis not present

## 2021-05-31 ENCOUNTER — Ambulatory Visit (INDEPENDENT_AMBULATORY_CARE_PROVIDER_SITE_OTHER): Payer: Medicare Other | Admitting: *Deleted

## 2021-05-31 DIAGNOSIS — J4550 Severe persistent asthma, uncomplicated: Secondary | ICD-10-CM

## 2021-06-01 ENCOUNTER — Other Ambulatory Visit: Payer: Self-pay | Admitting: Nurse Practitioner

## 2021-06-01 ENCOUNTER — Other Ambulatory Visit: Payer: Self-pay | Admitting: Allergy & Immunology

## 2021-06-07 ENCOUNTER — Other Ambulatory Visit: Payer: Self-pay | Admitting: *Deleted

## 2021-06-07 MED ORDER — FLUTICASONE-SALMETEROL 250-50 MCG/ACT IN AEPB: 1.0000 | INHALATION_SPRAY | Freq: Two times a day (BID) | RESPIRATORY_TRACT | 5 refills | Status: DC

## 2021-06-14 ENCOUNTER — Ambulatory Visit (INDEPENDENT_AMBULATORY_CARE_PROVIDER_SITE_OTHER): Payer: Medicare Other

## 2021-06-14 ENCOUNTER — Other Ambulatory Visit: Payer: Self-pay

## 2021-06-14 DIAGNOSIS — J4550 Severe persistent asthma, uncomplicated: Secondary | ICD-10-CM | POA: Diagnosis not present

## 2021-06-27 ENCOUNTER — Telehealth: Payer: Self-pay | Admitting: *Deleted

## 2021-06-27 ENCOUNTER — Ambulatory Visit: Payer: Medicare Other

## 2021-06-27 NOTE — Telephone Encounter (Signed)
L/m for patient to contact office to make MD appt for [place] reapproval

## 2021-07-02 ENCOUNTER — Other Ambulatory Visit: Payer: Self-pay

## 2021-07-02 ENCOUNTER — Ambulatory Visit (INDEPENDENT_AMBULATORY_CARE_PROVIDER_SITE_OTHER): Payer: Medicare Other

## 2021-07-02 DIAGNOSIS — J4550 Severe persistent asthma, uncomplicated: Secondary | ICD-10-CM | POA: Diagnosis not present

## 2021-07-16 ENCOUNTER — Other Ambulatory Visit: Payer: Self-pay

## 2021-07-16 ENCOUNTER — Ambulatory Visit (INDEPENDENT_AMBULATORY_CARE_PROVIDER_SITE_OTHER): Payer: Medicare Other

## 2021-07-16 DIAGNOSIS — J4550 Severe persistent asthma, uncomplicated: Secondary | ICD-10-CM

## 2021-07-23 ENCOUNTER — Other Ambulatory Visit: Payer: Self-pay | Admitting: Allergy & Immunology

## 2021-07-23 ENCOUNTER — Other Ambulatory Visit: Payer: Self-pay | Admitting: Nurse Practitioner

## 2021-07-26 ENCOUNTER — Ambulatory Visit (INDEPENDENT_AMBULATORY_CARE_PROVIDER_SITE_OTHER): Payer: Medicare Other | Admitting: Allergy & Immunology

## 2021-07-26 ENCOUNTER — Encounter: Payer: Self-pay | Admitting: Allergy & Immunology

## 2021-07-26 ENCOUNTER — Other Ambulatory Visit: Payer: Self-pay

## 2021-07-26 VITALS — BP 140/70 | HR 76 | Temp 97.6°F | Resp 16 | Ht <= 58 in | Wt 149.4 lb

## 2021-07-26 DIAGNOSIS — J3089 Other allergic rhinitis: Secondary | ICD-10-CM | POA: Diagnosis not present

## 2021-07-26 DIAGNOSIS — J302 Other seasonal allergic rhinitis: Secondary | ICD-10-CM | POA: Diagnosis not present

## 2021-07-26 DIAGNOSIS — J339 Nasal polyp, unspecified: Secondary | ICD-10-CM | POA: Diagnosis not present

## 2021-07-26 DIAGNOSIS — L309 Dermatitis, unspecified: Secondary | ICD-10-CM

## 2021-07-26 DIAGNOSIS — J4550 Severe persistent asthma, uncomplicated: Secondary | ICD-10-CM

## 2021-07-26 MED ORDER — EPINEPHRINE 0.3 MG/0.3ML IJ SOAJ: INTRAMUSCULAR | 1 refills | Status: DC

## 2021-07-26 MED ORDER — TRIAMCINOLONE ACETONIDE 0.5 % EX OINT: TOPICAL_OINTMENT | CUTANEOUS | 5 refills | Status: DC

## 2021-07-26 NOTE — Patient Instructions (Addendum)
1. Moderate persistent asthma, uncomplicated - Lung testing looked stable today. - We are not going to make any changes at this point in time.  - Daily controller medication(s): Wixela 250/50 mcg one puff twice daily + Dupixent every two weeks  - Prior to physical activity: albuterol 2 puffs 10-15 minutes before physical activity. - Rescue medications: albuterol 4 puffs every 4-6 hours as needed - Asthma control goals:  * Full participation in all desired activities (may need albuterol before activity) * Albuterol use two time or less a week on average (not counting use with activity) * Cough interfering with sleep two time or less a month * Oral steroids no more than once a year * No hospitalizations  2. Chronic rhinitis with nasal polyps - Continue with fluticasone two sprays per nostril twice daily. - Continue with cetirizine 10mg  daily.   3. Eczema versus contact dermatitis  - The Dupixent seems to have gotten your skin under better control.  - Continue with your topical steroid to see if this helps with the rash.   4. Return in about 3 months (around 10/24/2021).    Please inform us of any Emergency Department visits, hospitalizations, or changes in symptoms. Call us before going to the ED for breathing or allergy symptoms since we might be able to fit you in for a sick visit. Feel free to contact us anytime with any questions, problems, or concerns.  It was a pleasure to see you again today!  Websites that have reliable patient information: 1. American Academy of Asthma, Allergy, and Immunology: www.aaaai.org 2. Food Allergy Research and Education (FARE): foodallergy.org 3. Mothers of Asthmatics: http://www.asthmacommunitynetwork.org 4. American College of Allergy, Asthma, and Immunology: www.acaai.org   COVID-19 Vaccine Information can be found at: ShippingScam.co.uk For questions related to vaccine distribution or  appointments, please email [email or web address] or call [phone].   We realize that you might be concerned about having an allergic reaction to the COVID19 vaccines. To help with that concern, WE ARE OFFERING THE COVID19 VACCINES IN OUR OFFICE! Ask the front desk for dates!     Like [place] on [place] and Instagram for our latest updates!      A healthy democracy works best when [employer] participate! Make sure you are registered to vote! If you have moved or changed any of your contact information, you will need to get this updated before voting!  In some cases, you MAY be able to register to vote online: [email or web address]

## 2021-07-26 NOTE — Progress Notes (Signed)
FOLLOW UP  Date of Service/Encounter:  07/26/21   Assessment:   Severe persistent asthma with restrictive spirometry - improved spiro today   Chronic rhinitis (grasses, molds)   Disorder of respiratory system exacerbated by aspirin   Nasal polyposis (L>R) - markedly improved on Dupixent (now changed to [place]uc[place] with worsening eczema control   Eczema - not well controlled since changing away from [place]   Vaccinated against COVID-19, including booster    Plan/Recommendations:   1. Moderate persistent asthma, uncomplicated - Lung testing looked stable today. - We are not going to make any changes at this point in time.  - Daily controller medication(s): Wixela 250/50 mcg one puff twice daily + Dupixent every two weeks  - Prior to physical activity: albuterol 2 puffs 10-15 minutes before physical activity. - Rescue medications: albuterol 4 puffs every 4-6 hours as needed - Asthma control goals:  * Full participation in all desired activities (may need albuterol before activity) * Albuterol use two time or less a week on average (not counting use with activity) * Cough interfering with sleep two time or less a month * Oral steroids no more than once a year * No hospitalizations  2. Chronic rhinitis with nasal polyps - Continue with fluticasone two sprays per nostril twice daily. - Continue with cetirizine 10mg  daily.   3. Eczema versus contact dermatitis  - The Dupixent seems to have gotten your skin under better control.  - Continue with your topical steroid to see if this helps with the rash.   4. Return in about 3 months (around 10/24/2021).   Subjective:   Tammie [name] is a 60 y.o. female presenting today for follow up of  Chief Complaint  Patient presents with   Asthma    Dupixent reapproval Patient states wheezing on/off last week and today but doing ok.    Tammie [name] has a history of the following: Patient Active Problem List   Diagnosis Date Noted    Type 2 diabetes mellitus with hyperglycemia ([place]) 76/72/0947   Metabolic syndrome 09/62/8366   Chest pain at rest 11/29/2019   Mixed hyperlipidemia 11/10/2019   Hyperglycemia 11/10/2019   Dermatitis 06/22/2019   Chronic rhinitis 04/06/2019   Disorder of respiratory system exacerbated by aspirin 04/06/2019   Nasal polyposis 04/06/2019   Moderate persistent asthma without complication 29/47/6546   Generalized anxiety disorder 09/25/2016   Chronic seasonal allergic rhinitis 09/25/2016   Aortic valve regurgitation 09/25/2016   Other dysphagia 04/26/2014   Hypertension 04/18/2014    History obtained from: chart review and patient.  Tammie [name] is a 60 y.o. female presenting for a follow up visit.  She was last seen in April 2022.  At that time, her lung testing to look better.  We decided to restart apixaban because her skin has worsened.  We continued Wixela 250/50 mcg 1 puff twice daily.  For her rhinitis, would continue with Flonase and cetirizine.  In the interim, she has restarted her Dupixent which she receives every 2 weeks. She is somewhat sad because her son's sitter died around this time last year. Her son has autism and was very close to her son. He had been his sitter for 13 years so the relationship was very close.   Asthma/Respiratory Symptom History: Her breathing has been stable. Some days are good and others are not good. Most days are good for her. She remains on her [place].  Going back to [place] seems to have helped her a lot. She remains on  her Wixela at least once daily. She reports that she is sleeping fairly well at night. She denies any nighttime awakenings. She does wake up feeling refreshed. This varies a lot.   Allergic Rhinitis Symptom History: She does have Flonase that she uses fairly regularly. She has been doing it more often but slacked off recently.  She has not needed antibiotics at all for sinus infections. Weather changes always throw her off a bit. Most of  her issues are intense during the summer months. Her Dupixent has helped with her congestion. She is able to smell stuff without a problem.   Skin Symptom History: Skin is fairly good. She does report that she gets ashy often.  She does have a tube of triamcinolone.  She does not use it very often.  The addition of the [place] has helped quite a bit.  She has not needed antibiotics or any staphylococcal skin infections.  GERD Symptom History: She reports that she has "spasms" in her throat. She denies any reflux medications. These occur once in a blue moon. She has never seen GI for this.  She is not really interested in taking a daily medication for these symptoms.   Because of her consistent restrictive lung disease, we did get a chest CT earlier this year. This was largely unrevealing.  IMPRESSION: 1. No evidence of interstitial lung disease. 2. Mild dilatation of the pulmonic trunk (3.7 cm in diameter), concerning for pulmonary arterial hypertension. 3. Multiple small 2-3 mm nonobstructive calculi and/or parenchymal calcifications in the upper pole of the left kidney incidentally noted. 4. Aortic atherosclerosis.  Otherwise, there have been no changes to her past medical history, surgical history, family history, or social history.    Review of Systems  Constitutional: Negative.  Negative for chills, fever, malaise/fatigue and weight loss.  HENT:  Positive for congestion. Negative for ear discharge, ear pain and sinus pain.   Eyes:  Negative for pain, discharge and redness.  Respiratory:  Negative for cough, sputum production, shortness of breath and wheezing.   Cardiovascular: Negative.  Negative for chest pain and palpitations.  Gastrointestinal:  Negative for abdominal pain, constipation, diarrhea, heartburn, nausea and vomiting.  Skin: Negative.  Negative for itching and rash.  Neurological:  Negative for dizziness and headaches.  Endo/Heme/Allergies:  Positive for environmental  allergies. Does not bruise/bleed easily.      Objective:   Blood pressure 140/70, pulse 76, temperature 97.6 F (36.4 C), resp. rate 16, height 4\' 9"  (1.448 m), weight 149 lb 6.4 oz (67.8 kg), SpO2 96 %. Body mass index is 32.33 kg/m.   Physical Exam:  Physical Exam Constitutional:      Appearance: She is well-developed.     Comments: Smiling and talkative.  HENT:     Head: Normocephalic and atraumatic.     Right Ear: Tympanic membrane, ear canal and external ear normal.     Left Ear: Tympanic membrane, ear canal and external ear normal.     Nose: Mucosal edema and rhinorrhea present. No nasal deformity or septal deviation.     Right Turbinates: Enlarged, swollen and pale.     Left Turbinates: Enlarged, swollen and pale.     Right Sinus: No maxillary sinus tenderness or frontal sinus tenderness.     Left Sinus: No maxillary sinus tenderness or frontal sinus tenderness.     Comments: Nasal polyps appear to be worsening on the left, although they are present bilaterally.    Mouth/Throat:     Mouth: Mucous  membranes are not pale and not dry.     Pharynx: Uvula midline.  Eyes:     General:        Right eye: No discharge.        Left eye: No discharge.     Conjunctiva/sclera: Conjunctivae normal.     Right eye: Right conjunctiva is not injected. No chemosis.    Left eye: Left conjunctiva is not injected. No chemosis.    Pupils: Pupils are equal, round, and reactive to light.  Cardiovascular:     Rate and Rhythm: Normal rate and regular rhythm.     Heart sounds: Normal heart sounds.  Pulmonary:     Effort: Pulmonary effort is normal. No tachypnea, accessory muscle usage or respiratory distress.     Breath sounds: Normal breath sounds. No wheezing, rhonchi or rales.  Chest:     Chest wall: No tenderness.  Lymphadenopathy:     Cervical: No cervical adenopathy.  Skin:    General: Skin is warm.     Capillary Refill: Capillary refill takes less than 2 seconds.     Coloration:  Skin is not pale.     Findings: No abrasion, erythema, petechiae or rash. Rash is not papular, urticarial or vesicular.     Comments: Skin looks much better today.  There are few excoriations.  Neurological:     Mental Status: She is alert.  Psychiatric:        Behavior: Behavior is cooperative.     Diagnostic studies:    Spirometry: Largely unchanged from the last time we saw her.  However, and certainly higher than we have seen that in the past years.      Salvatore Marvel, MD  [facility]

## 2021-08-01 ENCOUNTER — Ambulatory Visit (INDEPENDENT_AMBULATORY_CARE_PROVIDER_SITE_OTHER): Payer: Medicare Other

## 2021-08-01 ENCOUNTER — Other Ambulatory Visit: Payer: Self-pay

## 2021-08-01 DIAGNOSIS — J4550 Severe persistent asthma, uncomplicated: Secondary | ICD-10-CM

## 2021-08-15 ENCOUNTER — Ambulatory Visit (INDEPENDENT_AMBULATORY_CARE_PROVIDER_SITE_OTHER): Payer: Medicare Other

## 2021-08-15 ENCOUNTER — Other Ambulatory Visit: Payer: Self-pay

## 2021-08-15 DIAGNOSIS — J4550 Severe persistent asthma, uncomplicated: Secondary | ICD-10-CM | POA: Diagnosis not present

## 2021-08-17 ENCOUNTER — Ambulatory Visit: Payer: Medicare Other | Admitting: Nurse Practitioner

## 2021-08-23 ENCOUNTER — Other Ambulatory Visit: Payer: Self-pay | Admitting: Nurse Practitioner

## 2021-08-27 ENCOUNTER — Ambulatory Visit (INDEPENDENT_AMBULATORY_CARE_PROVIDER_SITE_OTHER): Payer: Medicare Other | Admitting: Nurse Practitioner

## 2021-08-27 ENCOUNTER — Other Ambulatory Visit: Payer: Self-pay

## 2021-08-27 ENCOUNTER — Encounter: Payer: Self-pay | Admitting: Nurse Practitioner

## 2021-08-27 VITALS — BP 124/80 | HR 78 | Temp 97.3°F | Ht <= 58 in | Wt 150.0 lb

## 2021-08-27 DIAGNOSIS — F5101 Primary insomnia: Secondary | ICD-10-CM | POA: Diagnosis not present

## 2021-08-27 DIAGNOSIS — J4540 Moderate persistent asthma, uncomplicated: Secondary | ICD-10-CM

## 2021-08-27 DIAGNOSIS — E782 Mixed hyperlipidemia: Secondary | ICD-10-CM

## 2021-08-27 DIAGNOSIS — I10 Essential (primary) hypertension: Secondary | ICD-10-CM | POA: Diagnosis not present

## 2021-08-27 DIAGNOSIS — Z23 Encounter for immunization: Secondary | ICD-10-CM | POA: Diagnosis not present

## 2021-08-27 DIAGNOSIS — J3089 Other allergic rhinitis: Secondary | ICD-10-CM | POA: Diagnosis not present

## 2021-08-27 DIAGNOSIS — E0865 Diabetes mellitus due to underlying condition with hyperglycemia: Secondary | ICD-10-CM

## 2021-08-27 NOTE — Patient Instructions (Addendum)
[facility] in [place], [place] Address: [phone], [place], [place] [address] Phone: [phone]  No caffeine after 2 (sodas, tea, chocolate)  Limit nap to 30 mins and nothing after 2 pm Write down your worries on a pad of paper beside bed- schedule worry time for the next day.  Relaxation/body scanning.

## 2021-08-27 NOTE — Progress Notes (Signed)
Careteam: Patient Care Team: [name], NP as PCP - General (Geriatric Medicine) [name], MD as Consulting Physician (Ophthalmology) Margaretha Seeds, MD as Consulting Physician (Pulmonary Disease) Valentina Shaggy, MD as Consulting Physician (Allergy and Immunology)  PLACE OF SERVICE:  [place]  Advanced Directive information Does Patient Have a Medical Advance Directive?: No, Would patient like information on creating a medical advance directive?: Yes (MAU/Ambulatory/Procedural Areas - Information given)  Allergies  Allergen Reactions   Ace Inhibitors Other (See Comments)    Angioedema   Aspirin Anaphylaxis, Swelling and Other (See Comments)    Throat swelling    Doxepin Anaphylaxis   Ibuprofen Anaphylaxis, Swelling and Other (See Comments)    Throat swelling   Nsaids Anaphylaxis, Swelling and Other (See Comments)   Lipitor [Atorvastatin] Swelling and Other (See Comments)    Face, feet swell    Chief Complaint  Patient presents with   Medical Management of Chronic Issues    6 month follow-up. Discuss need for eye exam, and a1c. Foot exam and flu vaccine today. Patient c/o sleep issues.      HPI: Patient is a 61 y.o. female for routine follow up.   Diabetes- continues on diabetic diet. Not on medication.  No exercise. Has eye doctor but has not followed up but scheduled for next month.   Continues to follow up with dentist.   Asthma- controlled.   Htn- controlled.   Having hard time sleeping. Drinking a lot of fluids with caffeine during the day. Tea, sodas   Review of Systems:  Review of Systems  Constitutional:  Negative for chills, fever and weight loss.  HENT:  Negative for tinnitus.   Respiratory:  Negative for cough, sputum production and shortness of breath.   Cardiovascular:  Negative for chest pain, palpitations and leg swelling.  Gastrointestinal:  Negative for abdominal pain, constipation, diarrhea and heartburn.   Genitourinary:  Positive for frequency. Negative for dysuria and urgency.  Musculoskeletal:  Negative for back pain, falls, joint pain and myalgias.  Skin: Negative.   Neurological:  Negative for dizziness and headaches.  Psychiatric/Behavioral:  Negative for depression and memory loss. The patient does not have insomnia.    Past Medical History:  Diagnosis Date   Allergy    Anaphylaxis to ASA   Anemia    Anxiety    Eczema    Environmental allergies    GERD (gastroesophageal reflux disease)    Patient denies   Headache(784.0)    Hx of adenomatous colonic polyps    Hyperlipidemia    Hypertension    Moderate intermittent asthma    Pre-diabetes    Respiratory failure, acute ([place]) 04/19/2014   Related to potential Anaphylaxis ? presumably to ASA - exacerbated by Moderate to Severe Asthma   Past Surgical History:  Procedure Laterality Date   AB[facility]  [facility]    COLONOSCOPY     NASAL SINUS SURGERY     Polyps   POLYPECTOMY     TRANSTHORACIC ECHOCARDIOGRAM  04/2014   in setting of cardiopulmonary arrest): EF 65 to 70%.  Mild concentric LVH.  GR 1 DD.  Mild AI.;;  August 2019: EF 55 to 60%.  Mild LVH.  GR 1 DD.  Mild AI/MR.  Mildly increased PA pressures.   Social History:   reports that she has never smoked. She has never used smokeless tobacco. She reports that she does not drink alcohol and does not use drugs.  Family  History  Problem Relation Age of Onset   Heart attack Father    Heart attack Mother    Diabetes Sister    High blood pressure Brother    Hypertension Brother    Asthma Brother    High blood pressure Sister    Cancer Sister    Lung cancer Sister    High blood pressure Son    Cancer Maternal Grandmother    Heart disease Paternal Grandmother    Pancreatic cancer Paternal Grandmother    Asthma Other        Great paternal grandfather    Autism Son    Asthma Other        Neice    Colon cancer Neg Hx    Esophageal cancer  Neg Hx    Rectal cancer Neg Hx    Stomach cancer Neg Hx    Allergic rhinitis Neg Hx    Angioedema Neg Hx    Eczema Neg Hx    Immunodeficiency Neg Hx    Atopy Neg Hx    Colon polyps Neg Hx     Medications: Patient's Medications  New Prescriptions   No medications on file  Previous Medications   ACETAMINOPHEN (TYLENOL) 160 MG/5ML SUSPENSION    Take by mouth every 6 (six) hours as needed.   ALBUTEROL (VENTOLIN HFA) 108 (90 BASE) MCG/ACT INHALER    INHALE 2 PUFFS INTO THE LUNGS EVERY 6 HOURS AS NEEDED FOR WHEEZING OR SHORTNESS OF BREATH   AMLODIPINE (NORVASC) 10 MG TABLET    TAKE 1 TABLET BY MOUTH EVERY DAY FOR HIGH BLOOD PRESSURE   BISOPROLOL (ZEBETA) 5 MG TABLET    TAKE 1/2 TABLET BY MOUTH EVERY DAY FOR HIGH BLOOD PRESSURE   CETIRIZINE (ZYRTEC) 10 MG TABLET    TAKE 1 TABLET(10 MG) BY MOUTH DAILY   DUPILUMAB (DUPIXENT) 200 MG/1.14ML PREFILLED SYRINGE    Inject 300 mg into the skin every 14 (fourteen) days.   DUPIXENT 300 MG/2ML PREFILLED SYRINGE    Inject 2 mLs into the skin as directed.   EPINEPHRINE 0.3 MG/0.3 ML IJ SOAJ INJECTION    ADMINISTER 0.3 MG IN THE MUSCLE 1 TIME FOR 1 DOSE   EZETIMIBE (ZETIA) 10 MG TABLET    TAKE 1 TABLET(10 MG) BY MOUTH DAILY   FLUTICASONE (FLONASE) 50 MCG/ACT NASAL SPRAY    SHAKE LIQUID AND USE 2 SPRAYS IN EACH NOSTRIL DAILY   FLUTICASONE-SALMETEROL (WIXELA INHUB) 250-50 MCG/ACT AEPB    Inhale 1 puff into the lungs in the morning and at bedtime.   HYDRALAZINE (APRESOLINE) 25 MG TABLET    TAKE 1 TABLET(25 MG) BY MOUTH EVERY 8 HOURS   OMEGA-3 FATTY ACIDS (FISH OIL) 500 MG CAPS    Take 1 capsule by mouth daily.   TRIAMCINOLONE OINTMENT (KENALOG) 0.5 %    APPLY TOPICALLY TO THE AFFECTED AREA(S) TWICE DAILY  Modified Medications   No medications on file  Discontinued Medications   ALBUTEROL (VENTOLIN HFA) 108 (90 BASE) MCG/ACT INHALER    INHALE 2 PUFFS INTO THE LUNGS EVERY 6 HOURS AS NEEDED FOR WHEEZING OR SHORTNESS OF BREATH   PFIZER-BIONT COVID-19 VAC-TRIS  SUSP INJECTION    Inject 3 mLs into the muscle as directed.    Physical Exam:  Vitals:   08/27/21 1039  BP: 124/80  Pulse: 78  Temp: (!) 97.3 F (36.3 C)  TempSrc: Temporal  SpO2: 97%  Weight: 150 lb (68 kg)  Height: $Remove'4\' 9"'sxKfEqi$  (1.448 m)   Body mass index is  32.46 kg/m. Wt Readings from Last 3 Encounters:  08/27/21 150 lb (68 kg)  07/26/21 149 lb 6.4 oz (67.8 kg)  02/09/21 150 lb 3.2 oz (68.1 kg)    Physical Exam Constitutional:      General: She is not in acute distress.    Appearance: She is well-developed. She is not diaphoretic.  HENT:     Head: Normocephalic and atraumatic.     Nose: Congestion and rhinorrhea present.     Mouth/Throat:     Pharynx: No oropharyngeal exudate.  Eyes:     Conjunctiva/sclera: Conjunctivae normal.     Pupils: Pupils are equal, round, and reactive to light.  Cardiovascular:     Rate and Rhythm: Normal rate and regular rhythm.     Heart sounds: Normal heart sounds.  Pulmonary:     Effort: Pulmonary effort is normal.     Breath sounds: Normal breath sounds.  Abdominal:     General: Bowel sounds are normal.     Palpations: Abdomen is soft.  Musculoskeletal:     Cervical back: Normal range of motion and neck supple.     Right lower leg: No edema.     Left lower leg: No edema.  Skin:    General: Skin is warm and dry.  Neurological:     Mental Status: She is alert.  Psychiatric:        Mood and Affect: Mood normal.   Labs reviewed: Basic Metabolic Panel: Recent Labs    02/09/21 1011  NA 142  K 3.3*  CL 107  CO2 27  GLUCOSE 79  BUN 9  CREATININE 0.93  CALCIUM 9.4   Liver Function Tests: Recent Labs    02/09/21 1011  AST 22  ALT 19  BILITOT 0.9  PROT 7.1   No results for input(s): LIPASE, AMYLASE in the last 8760 hours. No results for input(s): AMMONIA in the last 8760 hours. CBC: Recent Labs    02/09/21 1011  WBC 6.8  NEUTROABS 2,985  HGB 13.4  HCT 40.9  MCV 82.1  PLT 308   Lipid Panel: Recent Labs     02/09/21 1011  CHOL 118  HDL 37*  LDLCALC 59  TRIG 135  CHOLHDL 3.2   TSH: No results for input(s): TSH in the last 8760 hours. A1C: Lab Results  Component Value Date   HGBA1C 5.7 (H) 02/09/2021     Assessment/Plan 1. Need for influenza vaccination - Flu Vaccine QUAD High Dose(Fluad)  2. Diabetes mellitus due to underlying condition with hyperglycemia, without long-term current use of insulin (HCC) -Encouraged dietary compliance, routine foot care/monitoring and to keep up with diabetic eye exams through ophthalmology - Flu Vaccine QUAD High Dose(Fluad) - Hemoglobin A1c - Lipid Panel  3. Essential hypertension -Blood pressure well controlled Continue current medications Recheck metabolic panel - CBC with Differential/Platelet - CMP with eGFR([facility])  4. Non-seasonal allergic rhinitis, unspecified trigger -stable, continues to follow up with allergist, continues on mepolizumab, fluticasone and cetirizine  5. Moderate persistent asthma without complication Stable, continues on mepolizumab, with wixela twice daily   6. Mixed hyperlipidemia LDL goal <70, continues on zetia  - Lipid Panel  7. Insomnia Discussed lifestyle modifications that she could impliement for better control. Handouts also given   Next appt: 4 months.  Carlos American. [place], [facility] [phone]

## 2021-08-28 ENCOUNTER — Other Ambulatory Visit: Payer: Self-pay

## 2021-08-28 ENCOUNTER — Other Ambulatory Visit: Payer: Self-pay | Admitting: Nurse Practitioner

## 2021-08-28 ENCOUNTER — Ambulatory Visit (INDEPENDENT_AMBULATORY_CARE_PROVIDER_SITE_OTHER): Payer: Medicare Other | Admitting: Nurse Practitioner

## 2021-08-28 DIAGNOSIS — E876 Hypokalemia: Secondary | ICD-10-CM | POA: Diagnosis not present

## 2021-08-28 DIAGNOSIS — E0865 Diabetes mellitus due to underlying condition with hyperglycemia: Secondary | ICD-10-CM

## 2021-08-28 DIAGNOSIS — E782 Mixed hyperlipidemia: Secondary | ICD-10-CM

## 2021-08-28 LAB — CBC WITH DIFFERENTIAL/PLATELET
Absolute Monocytes: 454 cells/uL (ref 200–950)
Basophils Absolute: 62 cells/uL (ref 0–200)
Basophils Relative: 1.1 %
Eosinophils Absolute: 241 cells/uL (ref 15–500)
Eosinophils Relative: 4.3 %
HCT: 41.3 % (ref 35.0–45.0)
Hemoglobin: 13.7 g/dL (ref 11.7–15.5)
Lymphs Abs: 2156 cells/uL (ref 850–3900)
MCH: 27.3 pg (ref 27.0–33.0)
MCHC: 33.2 g/dL (ref 32.0–36.0)
MCV: 82.4 fL (ref 80.0–100.0)
MPV: 10.3 fL (ref 7.5–12.5)
Monocytes Relative: 8.1 %
Neutro Abs: 2688 cells/uL (ref 1500–7800)
Neutrophils Relative %: 48 %
Platelets: 335 10*3/uL (ref 140–400)
RBC: 5.01 10*6/uL (ref 3.80–5.10)
RDW: 13.1 % (ref 11.0–15.0)
Total Lymphocyte: 38.5 %
WBC: 5.6 10*3/uL (ref 3.8–10.8)

## 2021-08-28 LAB — COMPLETE METABOLIC PANEL WITH GFR
AG Ratio: 1.1 (calc) (ref 1.0–2.5)
ALT: 20 U/L (ref 6–29)
AST: 19 U/L (ref 10–35)
Albumin: 4 g/dL (ref 3.6–5.1)
Alkaline phosphatase (APISO): 97 U/L (ref 37–153)
BUN: 10 mg/dL (ref 7–25)
CO2: 30 mmol/L (ref 20–32)
Calcium: 9.4 mg/dL (ref 8.6–10.4)
Chloride: 106 mmol/L (ref 98–110)
Creat: 1.03 mg/dL (ref 0.50–1.05)
Globulin: 3.5 g/dL (calc) (ref 1.9–3.7)
Glucose, Bld: 93 mg/dL (ref 65–99)
Potassium: 3.3 mmol/L — ABNORMAL LOW (ref 3.5–5.3)
Sodium: 142 mmol/L (ref 135–146)
Total Bilirubin: 0.7 mg/dL (ref 0.2–1.2)
Total Protein: 7.5 g/dL (ref 6.1–8.1)
eGFR: 61 mL/min/{1.73_m2} (ref >=60)

## 2021-08-28 LAB — LIPID PANEL
Cholesterol: 146 mg/dL (ref <200)
HDL: 47 mg/dL — ABNORMAL LOW (ref >=50)
LDL Cholesterol (Calc): 75 mg/dL (calc)
Non-HDL Cholesterol (Calc): 99 mg/dL (calc) (ref <130)
Total CHOL/HDL Ratio: 3.1 (calc) (ref <5.0)
Triglycerides: 153 mg/dL — ABNORMAL HIGH (ref <150)

## 2021-08-28 LAB — HEMOGLOBIN A1C
Hgb A1c MFr Bld: 5.9 % of total Hgb — ABNORMAL HIGH (ref <5.7)
Mean Plasma Glucose: 123 mg/dL
eAG (mmol/L): 6.8 mmol/L

## 2021-08-28 MED ORDER — POTASSIUM CHLORIDE CRYS ER 20 MEQ PO TBCR
20.0000 meq | EXTENDED_RELEASE_TABLET | Freq: Every day | ORAL | 3 refills | Status: DC
Start: 2021-08-28 — End: 2021-12-20

## 2021-08-28 NOTE — Progress Notes (Signed)
This service is provided via telemedicine  No vital signs collected/recorded due to the encounter was a telemedicine visit.   Location of patient (ex: home, work):  Home  Patient consents to a telephone visit:  Yes, see encounter dated 03/20/2021  Location of the provider (ex: office, home):  [place]  Name of any referring provider:  N/A  Names of all persons participating in the telemedicine service and their role in the encounter:  Sherrie Mustache, Nurse Practitioner, Carroll Kinds, CMA, and patient.   Time spent on call:  7 minutes with medical assistant

## 2021-08-28 NOTE — Progress Notes (Signed)
Careteam: Patient Care Team: [name], NP as PCP - General (Geriatric Medicine) [name], MD as Consulting Physician (Ophthalmology) Margaretha Seeds, MD as Consulting Physician (Pulmonary Disease) Valentina Shaggy, MD as Consulting Physician (Allergy and Immunology)  Advanced Directive information    Allergies  Allergen Reactions   Ace Inhibitors Other (See Comments)    Angioedema   Aspirin Anaphylaxis, Swelling and Other (See Comments)    Throat swelling    Doxepin Anaphylaxis   Ibuprofen Anaphylaxis, Swelling and Other (See Comments)    Throat swelling   Nsaids Anaphylaxis, Swelling and Other (See Comments)   Lipitor [Atorvastatin] Swelling and Other (See Comments)    Face, feet swell    Chief Complaint  Patient presents with   Acute Visit    Discuss labs.      HPI: Patient is a 61 y.o. female to discuss lab work.   Her potassium has been chronically low.   Lipids mildly over goal.   A1c 5.9, up from 5.7   Review of Systems:  Review of Systems  Constitutional:  Negative for chills, fever and malaise/fatigue.  Gastrointestinal:  Negative for abdominal pain, constipation and diarrhea.  Musculoskeletal:  Negative for myalgias.   Past Medical History:  Diagnosis Date   Allergy    Anaphylaxis to ASA   Anemia    Anxiety    Eczema    Environmental allergies    GERD (gastroesophageal reflux disease)    Patient denies   Headache(784.0)    Hx of adenomatous colonic polyps    Hyperlipidemia    Hypertension    Moderate intermittent asthma    Pre-diabetes    Respiratory failure, acute ([place]) 04/19/2014   Related to potential Anaphylaxis ? presumably to ASA - exacerbated by Moderate to Severe Asthma   Past Surgical History:  Procedure Laterality Date   ABDOM[facility]OMY  [facility]    COLONOSCOPY     NASAL SINUS SURGERY     Polyps   POLYPECTOMY     TRANSTHORACIC ECHOCARDIOGRAM  04/2014   in setting of  cardiopulmonary arrest): EF 65 to 70%.  Mild concentric LVH.  GR 1 DD.  Mild AI.;;  August 2019: EF 55 to 60%.  Mild LVH.  GR 1 DD.  Mild AI/MR.  Mildly increased PA pressures.   Social History:   reports that she has never smoked. She has never used smokeless tobacco. She reports that she does not drink alcohol and does not use drugs.  Family History  Problem Relation Age of Onset   Heart attack Father    Heart attack Mother    Diabetes Sister    High blood pressure Brother    Hypertension Brother    Asthma Brother    High blood pressure Sister    Cancer Sister    Lung cancer Sister    High blood pressure Son    Cancer Maternal Grandmother    Heart disease Paternal Grandmother    Pancreatic cancer Paternal Grandmother    Asthma Other        Great paternal grandfather    Autism Son    Asthma Other        Neice    Colon cancer Neg Hx    Esophageal cancer Neg Hx    Rectal cancer Neg Hx    Stomach cancer Neg Hx    Allergic rhinitis Neg Hx    Angioedema Neg Hx    Eczema Neg Hx  Immunodeficiency Neg Hx    Atopy Neg Hx    Colon polyps Neg Hx     Medications: Patient's Medications  New Prescriptions   No medications on file  Previous Medications   ACETAMINOPHEN (TYLENOL) 160 MG/5ML SUSPENSION    Take by mouth every 6 (six) hours as needed.   ALBUTEROL (VENTOLIN HFA) 108 (90 BASE) MCG/ACT INHALER    INHALE 2 PUFFS INTO THE LUNGS EVERY 6 HOURS AS NEEDED FOR WHEEZING OR SHORTNESS OF BREATH   AMLODIPINE (NORVASC) 10 MG TABLET    TAKE 1 TABLET BY MOUTH EVERY DAY FOR HIGH BLOOD PRESSURE   BISOPROLOL (ZEBETA) 5 MG TABLET    TAKE 1/2 TABLET BY MOUTH EVERY DAY FOR HIGH BLOOD PRESSURE   CETIRIZINE (ZYRTEC) 10 MG TABLET    TAKE 1 TABLET(10 MG) BY MOUTH DAILY   DUPILUMAB (DUPIXENT) 200 MG/1.14ML PREFILLED SYRINGE    Inject 300 mg into the skin every 14 (fourteen) days.   DUPIXENT 300 MG/2ML PREFILLED SYRINGE    Inject 2 mLs into the skin as directed.   EPINEPHRINE 0.3 MG/0.3 ML IJ  SOAJ INJECTION    ADMINISTER 0.3 MG IN THE MUSCLE 1 TIME FOR 1 DOSE   EZETIMIBE (ZETIA) 10 MG TABLET    TAKE 1 TABLET(10 MG) BY MOUTH DAILY   FLUTICASONE (FLONASE) 50 MCG/ACT NASAL SPRAY    SHAKE LIQUID AND USE 2 SPRAYS IN EACH NOSTRIL DAILY   FLUTICASONE-SALMETEROL (WIXELA INHUB) 250-50 MCG/ACT AEPB    Inhale 1 puff into the lungs in the morning and at bedtime.   HYDRALAZINE (APRESOLINE) 25 MG TABLET    TAKE 1 TABLET(25 MG) BY MOUTH EVERY 8 HOURS   OMEGA-3 FATTY ACIDS (FISH OIL) 500 MG CAPS    Take 1 capsule by mouth daily.   TRIAMCINOLONE OINTMENT (KENALOG) 0.5 %    APPLY TOPICALLY TO THE AFFECTED AREA(S) TWICE DAILY  Modified Medications   No medications on file  Discontinued Medications   No medications on file    Physical Exam:  There were no vitals filed for this visit. There is no height or weight on file to calculate BMI. Wt Readings from Last 3 Encounters:  08/27/21 150 lb (68 kg)  07/26/21 149 lb 6.4 oz (67.8 kg)  02/09/21 150 lb 3.2 oz (68.1 kg)    Physical Exam Labs reviewed: Basic Metabolic Panel: Recent Labs    02/09/21 1011 08/27/21 1214  NA 142 142  K 3.3* 3.3*  CL 107 106  CO2 27 30  GLUCOSE 79 93  BUN 9 10  CREATININE 0.93 1.03  CALCIUM 9.4 9.4   Liver Function Tests: Recent Labs    02/09/21 1011 08/27/21 1214  AST 22 19  ALT 19 20  BILITOT 0.9 0.7  PROT 7.1 7.5   No results for input(s): LIPASE, AMYLASE in the last 8760 hours. No results for input(s): AMMONIA in the last 8760 hours. CBC: Recent Labs    02/09/21 1011 08/27/21 1214  WBC 6.8 5.6  NEUTROABS 2,985 2,688  HGB 13.4 13.7  HCT 40.9 41.3  MCV 82.1 82.4  PLT 308 335   Lipid Panel: Recent Labs    02/09/21 1011 08/27/21 1214  CHOL 118 146  HDL 37* 47*  LDLCALC 59 75  TRIG 135 153*  CHOLHDL 3.2 3.1   TSH: No results for input(s): TSH in the last 8760 hours. A1C: Lab Results  Component Value Date   HGBA1C 5.9 (H) 08/27/2021     Assessment/Plan 1.  Hypokalemia -went  over labs, discussed starting potassium daily and she will come back to office in 2 weeks for recheck - potassium chloride SA (KLOR-CON M) 20 MEQ tablet; Take 1 tablet (20 mEq total) by mouth daily.  Dispense: 30 tablet; Refill: 3  2. Diabetes mellitus due to underlying condition with hyperglycemia, without long-term current use of insulin (HCC) -a1c slightly elevated from previous, dietary modification reviewed a  3. Mixed hyperlipidemia -reviewed labs and recommendations for dietary modifications.    Next appt: 2 weeks for lab [place]. Harle Battiest  [facility] [phone]    Virtual Visit via telephone  I connected with patient on 08/28/21 at  1:00 PM EST by telephone and verified that I am speaking with the correct person using two identifiers.  Location: Patient: home Provider: [place]   I discussed the limitations, risks, security and privacy concerns of performing an evaluation and management service by telephone and the availability of in person appointments. I also discussed with the patient that there may be a patient responsible charge related to this service. The patient expressed understanding and agreed to proceed.   I discussed the assessment and treatment plan with the patient. The patient was provided an opportunity to ask questions and all were answered. The patient agreed with the plan and demonstrated an understanding of the instructions.   The patient was advised to call back or seek an in-person evaluation if the symptoms worsen or if the condition fails to improve as anticipated.  I provided 11 minutes of non-face-to-face time during this encounter.  Carlos American. Harle Battiest Av[facility] printed and mailed

## 2021-09-04 ENCOUNTER — Other Ambulatory Visit: Payer: Self-pay

## 2021-09-04 ENCOUNTER — Ambulatory Visit (INDEPENDENT_AMBULATORY_CARE_PROVIDER_SITE_OTHER): Payer: Medicare Other | Admitting: *Deleted

## 2021-09-04 DIAGNOSIS — J4550 Severe persistent asthma, uncomplicated: Secondary | ICD-10-CM | POA: Diagnosis not present

## 2021-09-11 ENCOUNTER — Other Ambulatory Visit: Payer: Medicare Other

## 2021-09-11 DIAGNOSIS — E876 Hypokalemia: Secondary | ICD-10-CM

## 2021-09-13 ENCOUNTER — Other Ambulatory Visit: Payer: Self-pay | Admitting: Nurse Practitioner

## 2021-09-17 DIAGNOSIS — H0102B Squamous blepharitis left eye, upper and lower eyelids: Secondary | ICD-10-CM | POA: Diagnosis not present

## 2021-09-17 DIAGNOSIS — H40013 Open angle with borderline findings, low risk, bilateral: Secondary | ICD-10-CM | POA: Diagnosis not present

## 2021-09-17 DIAGNOSIS — H5010 Unspecified exotropia: Secondary | ICD-10-CM | POA: Diagnosis not present

## 2021-09-17 DIAGNOSIS — H25813 Combined forms of age-related cataract, bilateral: Secondary | ICD-10-CM | POA: Diagnosis not present

## 2021-09-17 DIAGNOSIS — H0102A Squamous blepharitis right eye, upper and lower eyelids: Secondary | ICD-10-CM | POA: Diagnosis not present

## 2021-09-18 ENCOUNTER — Other Ambulatory Visit: Payer: Self-pay

## 2021-09-18 ENCOUNTER — Ambulatory Visit (INDEPENDENT_AMBULATORY_CARE_PROVIDER_SITE_OTHER): Payer: Medicare Other | Admitting: *Deleted

## 2021-09-18 DIAGNOSIS — J4550 Severe persistent asthma, uncomplicated: Secondary | ICD-10-CM | POA: Diagnosis not present

## 2021-10-02 ENCOUNTER — Other Ambulatory Visit: Payer: Self-pay | Admitting: Allergy & Immunology

## 2021-10-02 ENCOUNTER — Other Ambulatory Visit: Payer: Self-pay

## 2021-10-02 ENCOUNTER — Ambulatory Visit (INDEPENDENT_AMBULATORY_CARE_PROVIDER_SITE_OTHER): Payer: Medicare Other

## 2021-10-02 DIAGNOSIS — J4550 Severe persistent asthma, uncomplicated: Secondary | ICD-10-CM

## 2021-10-16 ENCOUNTER — Encounter: Payer: Self-pay | Admitting: Allergy & Immunology

## 2021-10-16 ENCOUNTER — Ambulatory Visit (INDEPENDENT_AMBULATORY_CARE_PROVIDER_SITE_OTHER): Payer: Medicare Other | Admitting: Allergy & Immunology

## 2021-10-16 ENCOUNTER — Other Ambulatory Visit: Payer: Self-pay

## 2021-10-16 VITALS — BP 152/70 | HR 86 | Temp 98.0°F | Resp 16 | Ht <= 58 in | Wt 152.0 lb

## 2021-10-16 DIAGNOSIS — J4550 Severe persistent asthma, uncomplicated: Secondary | ICD-10-CM | POA: Diagnosis not present

## 2021-10-16 DIAGNOSIS — L309 Dermatitis, unspecified: Secondary | ICD-10-CM

## 2021-10-16 DIAGNOSIS — J339 Nasal polyp, unspecified: Secondary | ICD-10-CM

## 2021-10-16 DIAGNOSIS — J3089 Other allergic rhinitis: Secondary | ICD-10-CM

## 2021-10-16 NOTE — Patient Instructions (Addendum)
1. Moderate persistent asthma, uncomplicated ?- Lung testing not done today. ?- We are not going to make any changes.  ?- Daily controller medication(s): Wixela 250/50 mcg one puff twice daily + Dupixent every two weeks  ?- Prior to physical activity: albuterol 2 puffs 10-15 minutes before physical activity. ?- Rescue medications: albuterol 4 puffs every 4-6 hours as needed ?- Asthma control goals:  ?* Full participation in all desired activities (may need albuterol before activity) ?* Albuterol use two time or less a week on average (not counting use with activity) ?* Cough interfering with sleep two time or less a month ?* Oral steroids no more than once a year ?* No hospitalizations ? ?2. Chronic rhinitis with nasal polyps ?- Continue with fluticasone two sprays per nostril twice daily. ?- Continue with cetirizine '10mg'$  daily.  ? ?3. Eczema versus contact dermatitis  ?- The Dupixent seems to have gotten your skin under better control.  ?- Continue with your topical steroid to see if this helps with the rash.  ? ?4. Return in about 6 months (around 04/18/2022).  ? ? ?Please inform us of any Emergency Department visits, hospitalizations, or changes in symptoms. Call us before going to the ED for breathing or allergy symptoms since we might be able to fit you in for a sick visit. Feel free to contact us anytime with any questions, problems, or concerns. ? ?It was a pleasure to see you again today! ? ?Websites that have reliable patient information: ?1. American Academy of Asthma, Allergy, and Immunology: www.aaaai.org ?2. Food Allergy Research and Education (FARE): foodallergy.org ?3. Mothers of Asthmatics: http://www.asthmacommunitynetwork.org ?4. [employer] of Allergy, Asthma, and Immunology: MonthlyElectricBill.co.uk ? ? ?COVID-19 Vaccine Information can be found at: ShippingScam.co.uk For questions related to vaccine distribution or appointments, please email  [email or web address] or call [phone].  ? ?We realize that you might be concerned about having an allergic reaction to the COVID19 vaccines. To help with that concern, WE ARE OFFERING THE COVID19 VACCINES IN OUR OFFICE! Ask the front desk for dates!  ? ? ? ??Like? [place] on Facebook and Instagram for our latest updates!  ?  ? ? ?A healthy democracy works best when [employer] participate! Make sure you are registered to vote! If you have moved or changed any of your contact information, you will need to get this updated before voting! ? ?In some cases, you MAY be able to register to vote online: [email or web address] ? ? ? ? ?

## 2021-10-16 NOTE — Progress Notes (Signed)
? ?FOLLOW UP ? ?Date of Service/Encounter:  10/16/21 ? ? ?Assessment:  ? ?Severe persistent asthma with restrictive spirometry - improved spiro today ?  ?Chronic rhinitis (grasses, molds) ?  ?Disorder of respiratory system exacerbated by aspirin ?  ?Nasal polyposis (L>R) - markedly improved on Dupixent (now changed to [place] with worsening eczema control ?  ?Eczema - not well controlled since changing away from [place] ?  ?Vaccinated against COVID-19, including booster ? ?Plan/Recommendations:  ? ?1. Moderate persistent asthma, uncomplicated ?- Lung testing not done today. ?- We are not going to make any changes.  ?- Daily controller medication(s): Wixela 250/50 mcg one puff twice daily + Dupixent every two weeks  ?- Prior to physical activity: albuterol 2 puffs 10-15 minutes before physical activity. ?- Rescue medications: albuterol 4 puffs every 4-6 hours as needed ?- Asthma control goals:  ?* Full participation in all desired activities (may need albuterol before activity) ?* Albuterol use two time or less a week on average (not counting use with activity) ?* Cough interfering with sleep two time or less a month ?* Oral steroids no more than once a year ?* No hospitalizations ? ?2. Chronic rhinitis with nasal polyps ?- Continue with fluticasone two sprays per nostril twice daily. ?- Continue with cetirizine '10mg'$  daily.  ? ?3. Eczema versus contact dermatitis  ?- The Dupixent seems to have gotten your skin under better control.  ?- Continue with your topical steroid to see if this helps with the rash.  ? ?4. Return in about 6 months (around 04/18/2022).  ? ? ?Subjective:  ? ?Marquita Lias is a 61 y.o. female presenting today for follow up of  ?Chief Complaint  ?Patient presents with  ? Follow-up  ? Asthma  ? Allergic Rhinitis   ?  Two weeks ago wasn't doing too good. Right now doing alright.  ? Dermatitis  ? ? ?Mandeep Ferch has a history of the following: ?Patient Active Problem List  ? Diagnosis Date Noted  ?  Type 2 diabetes mellitus with hyperglycemia ([place]) 05/11/2020  ? Metabolic syndrome 70/17/7939  ? Chest pain at rest 11/29/2019  ? Mixed hyperlipidemia 11/10/2019  ? Hyperglycemia 11/10/2019  ? Dermatitis 06/22/2019  ? Chronic rhinitis 04/06/2019  ? Disorder of respiratory system exacerbated by aspirin 04/06/2019  ? Nasal polyposis 04/06/2019  ? Moderate persistent asthma without complication 03/00/9233  ? Generalized anxiety disorder 09/25/2016  ? Chronic seasonal allergic rhinitis 09/25/2016  ? Aortic valve regurgitation 09/25/2016  ? Other dysphagia 04/26/2014  ? Hypertension 04/18/2014  ? ? ?History obtained from: chart review and patient. ? ?Sanaia is a 61 y.o. female presenting for a follow up visit.  28 July 2021.  At that time her lung testing was stable.  We continue with Wixela 250 mcg 1 puff twice daily as well as Dupixent every 2 weeks.  For her rhinitis, we continue with Flonase and cetirizine. For her eczema, we continued with topical steroid as needed. ? ?Since last visit, she has mostly done well. She had some problems a couple of weeks ago and she had a slight case of asthma. She did not have her rescue inhaler and when she did use it, it took her a few days to improve. She called 911 nad swas diagnosed with dehydration.  ? ?Asthma/Respiratory Symptom History: She remains on the Wixela BID and the Dupixent. She was doing well before the exposure. She has largely done well. She does report that she cannot sleep when she gets really nervous. When  she starts to have blossoming, she cannot sleep well. She also has problems with making her brain.   ? ?Allergic Rhinitis Symptom History: She feels that she is having some improvement in her nasal polyps. She had to open up her nose at one point and the polyps were "hanging out of [her] nose". This has not been a problem since starting the [place].  ? ?Skin Symptom History: Her skin is under good control with the current regimen. The Dupixent seems to be  helping her a lot.  She remains on triamcinolone 0.5% twice daily as needed. ? ?Otherwise, there have been no changes to her past medical history, surgical history, family history, or social history. ? ? ? ?Review of Systems  ?Constitutional: Negative.  Negative for chills, fever, malaise/fatigue and weight loss.  ?HENT:  Negative for congestion, ear discharge, ear pain and sinus pain.   ?Eyes:  Negative for pain, discharge and redness.  ?Respiratory:  Negative for cough, sputum production, shortness of breath and wheezing.   ?Cardiovascular: Negative.  Negative for chest pain and palpitations.  ?Gastrointestinal:  Negative for abdominal pain, constipation, diarrhea, heartburn, nausea and vomiting.  ?Skin: Negative.  Negative for itching and rash.  ?Neurological:  Negative for dizziness and headaches.  ?Endo/Heme/Allergies:  Negative for environmental allergies. Does not bruise/bleed easily.   ? ? ? ?Objective:  ? ?Blood pressure (!) 152/70, pulse 86, temperature 98 ?F (36.7 ?C), temperature source Temporal, resp. rate 16, height '4\' 9"'$  (1.448 m), weight 152 lb (68.9 kg), SpO2 96 %. ?Body mass index is 32.89 kg/m?. ? ? ? ?Physical Exam ?Constitutional:   ?   General: She is not in acute distress. ?   Appearance: She is well-developed. She is not ill-appearing.  ?   Comments: Smiling and talkative.  ?HENT:  ?   Head: Normocephalic and atraumatic.  ?   Right Ear: Tympanic membrane, ear canal and external ear normal.  ?   Left Ear: Tympanic membrane, ear canal and external ear normal.  ?   Nose: Mucosal edema and rhinorrhea present. No nasal deformity or septal deviation.  ?   Right Turbinates: Enlarged, swollen and pale.  ?   Left Turbinates: Enlarged, swollen and pale.  ?   Right Sinus: No maxillary sinus tenderness or frontal sinus tenderness.  ?   Left Sinus: No maxillary sinus tenderness or frontal sinus tenderness.  ?   Comments: Nasal polyps appear to be worsening on the left, although they are present  bilaterally. ?   Mouth/Throat:  ?   Mouth: Mucous membranes are not pale and not dry.  ?   Pharynx: Uvula midline.  ?   Tonsils: No tonsillar exudate or tonsillar abscesses. 1+ on the right. 1+ on the left.  ?Eyes:  ?   General:     ?   Right eye: No discharge.     ?   Left eye: No discharge.  ?   Conjunctiva/sclera: Conjunctivae normal.  ?   Right eye: Right conjunctiva is not injected. No chemosis. ?   Left eye: Left conjunctiva is not injected. No chemosis. ?   Pupils: Pupils are equal, round, and reactive to light.  ?Cardiovascular:  ?   Rate and Rhythm: Normal rate and regular rhythm.  ?   Heart sounds: Normal heart sounds.  ?Pulmonary:  ?   Effort: Pulmonary effort is normal. No tachypnea, accessory muscle usage or respiratory distress.  ?   Breath sounds: Normal breath sounds. No wheezing, rhonchi or rales.  ?  Chest:  ?   Chest wall: No tenderness.  ?Lymphadenopathy:  ?   Cervical: No cervical adenopathy.  ?Skin: ?   General: Skin is warm.  ?   Capillary Refill: Capillary refill takes less than 2 seconds.  ?   Coloration: Skin is not pale.  ?   Findings: No abrasion, erythema, petechiae or rash. Rash is not papular, urticarial or vesicular.  ?   Comments: Skin looks much better today.  There are few excoriations.  ?Neurological:  ?   Mental Status: She is alert.  ?Psychiatric:     ?   Behavior: Behavior is cooperative.  ?  ? ?Diagnostic studies: none ? ? ? ?  ?Salvatore Marvel, MD  ?[facility] ? ? ? ? ? ? ?

## 2021-10-17 ENCOUNTER — Other Ambulatory Visit: Payer: Self-pay | Admitting: Nurse Practitioner

## 2021-10-17 ENCOUNTER — Encounter: Payer: Self-pay | Admitting: Allergy & Immunology

## 2021-10-17 DIAGNOSIS — I10 Essential (primary) hypertension: Secondary | ICD-10-CM

## 2021-10-22 ENCOUNTER — Other Ambulatory Visit: Payer: Self-pay

## 2021-10-22 ENCOUNTER — Other Ambulatory Visit: Payer: Self-pay | Admitting: Nurse Practitioner

## 2021-10-22 ENCOUNTER — Ambulatory Visit (INDEPENDENT_AMBULATORY_CARE_PROVIDER_SITE_OTHER): Payer: Medicare Other

## 2021-10-22 DIAGNOSIS — H40013 Open angle with borderline findings, low risk, bilateral: Secondary | ICD-10-CM | POA: Diagnosis not present

## 2021-10-22 DIAGNOSIS — I10 Essential (primary) hypertension: Secondary | ICD-10-CM

## 2021-10-22 DIAGNOSIS — J4550 Severe persistent asthma, uncomplicated: Secondary | ICD-10-CM

## 2021-11-05 ENCOUNTER — Ambulatory Visit (INDEPENDENT_AMBULATORY_CARE_PROVIDER_SITE_OTHER): Payer: Medicare Other

## 2021-11-05 DIAGNOSIS — J4550 Severe persistent asthma, uncomplicated: Secondary | ICD-10-CM | POA: Diagnosis not present

## 2021-11-10 ENCOUNTER — Other Ambulatory Visit: Payer: Self-pay | Admitting: Pulmonary Disease

## 2021-11-10 ENCOUNTER — Other Ambulatory Visit: Payer: Self-pay | Admitting: Nurse Practitioner

## 2021-11-10 DIAGNOSIS — I10 Essential (primary) hypertension: Secondary | ICD-10-CM

## 2021-11-19 ENCOUNTER — Ambulatory Visit (INDEPENDENT_AMBULATORY_CARE_PROVIDER_SITE_OTHER): Payer: Medicare Other

## 2021-11-19 DIAGNOSIS — J4550 Severe persistent asthma, uncomplicated: Secondary | ICD-10-CM | POA: Diagnosis not present

## 2021-12-03 ENCOUNTER — Ambulatory Visit (INDEPENDENT_AMBULATORY_CARE_PROVIDER_SITE_OTHER): Payer: Medicare Other

## 2021-12-03 DIAGNOSIS — J4550 Severe persistent asthma, uncomplicated: Secondary | ICD-10-CM | POA: Diagnosis not present

## 2021-12-17 ENCOUNTER — Ambulatory Visit (INDEPENDENT_AMBULATORY_CARE_PROVIDER_SITE_OTHER): Payer: Medicare Other

## 2021-12-17 DIAGNOSIS — J4550 Severe persistent asthma, uncomplicated: Secondary | ICD-10-CM | POA: Diagnosis not present

## 2021-12-20 ENCOUNTER — Other Ambulatory Visit: Payer: Self-pay | Admitting: Nurse Practitioner

## 2021-12-20 DIAGNOSIS — E876 Hypokalemia: Secondary | ICD-10-CM

## 2022-01-03 ENCOUNTER — Ambulatory Visit (INDEPENDENT_AMBULATORY_CARE_PROVIDER_SITE_OTHER): Payer: Medicare Other

## 2022-01-03 DIAGNOSIS — J4550 Severe persistent asthma, uncomplicated: Secondary | ICD-10-CM

## 2022-01-17 ENCOUNTER — Ambulatory Visit (INDEPENDENT_AMBULATORY_CARE_PROVIDER_SITE_OTHER): Payer: Medicare Other

## 2022-01-17 DIAGNOSIS — J4550 Severe persistent asthma, uncomplicated: Secondary | ICD-10-CM | POA: Diagnosis not present

## 2022-02-01 ENCOUNTER — Ambulatory Visit (INDEPENDENT_AMBULATORY_CARE_PROVIDER_SITE_OTHER): Payer: Medicare Other | Admitting: *Deleted

## 2022-02-01 DIAGNOSIS — J4550 Severe persistent asthma, uncomplicated: Secondary | ICD-10-CM

## 2022-02-18 ENCOUNTER — Ambulatory Visit (INDEPENDENT_AMBULATORY_CARE_PROVIDER_SITE_OTHER): Payer: Medicare Other

## 2022-02-18 ENCOUNTER — Other Ambulatory Visit: Payer: Self-pay | Admitting: Nurse Practitioner

## 2022-02-18 DIAGNOSIS — J4550 Severe persistent asthma, uncomplicated: Secondary | ICD-10-CM

## 2022-02-25 ENCOUNTER — Encounter: Payer: Self-pay | Admitting: Nurse Practitioner

## 2022-02-25 ENCOUNTER — Ambulatory Visit (INDEPENDENT_AMBULATORY_CARE_PROVIDER_SITE_OTHER): Payer: Medicare Other | Admitting: Nurse Practitioner

## 2022-02-25 VITALS — BP 126/78 | HR 69 | Temp 97.1°F | Ht <= 58 in | Wt 150.0 lb

## 2022-02-25 DIAGNOSIS — E0865 Diabetes mellitus due to underlying condition with hyperglycemia: Secondary | ICD-10-CM | POA: Diagnosis not present

## 2022-02-25 DIAGNOSIS — J4540 Moderate persistent asthma, uncomplicated: Secondary | ICD-10-CM | POA: Diagnosis not present

## 2022-02-25 DIAGNOSIS — K089 Disorder of teeth and supporting structures, unspecified: Secondary | ICD-10-CM | POA: Diagnosis not present

## 2022-02-25 DIAGNOSIS — I10 Essential (primary) hypertension: Secondary | ICD-10-CM

## 2022-02-25 DIAGNOSIS — E876 Hypokalemia: Secondary | ICD-10-CM | POA: Diagnosis not present

## 2022-02-25 DIAGNOSIS — J3089 Other allergic rhinitis: Secondary | ICD-10-CM | POA: Diagnosis not present

## 2022-02-25 DIAGNOSIS — E782 Mixed hyperlipidemia: Secondary | ICD-10-CM | POA: Diagnosis not present

## 2022-02-25 NOTE — Progress Notes (Signed)
Careteam: Patient Care Team: Tammie Chandler, NP as PCP - General (Geriatric Medicine) Tammie Fillers, MD as Consulting Physician (Ophthalmology) Tammie Seeds, MD as Consulting Physician (Pulmonary Disease) Tammie Shaggy, MD as Consulting Physician (Allergy and Immunology)  PLACE OF SERVICE:  [place] Directive information Does Patient Have a Medical Advance Directive?: No, Would patient like information on creating a medical advance directive?: No - Patient declined  Allergies  Allergen Reactions  . Ace Inhibitors Other (See Comments)    Angioedema  . Aspirin Anaphylaxis, Swelling and Other (See Comments)    Throat swelling   . Doxepin Anaphylaxis  . Ibuprofen Anaphylaxis, Swelling and Other (See Comments)    Throat swelling  . Nsaids Anaphylaxis, Swelling and Other (See Comments)  . Lipitor [Atorvastatin] Swelling and Other (See Comments)    Face, feet swell    Chief Complaint  Patient presents with  . Medical Management of Chronic Issues    6 month follow-up and foot exam. Discuss need for eye exam, A1c, and pap smear or post pone if patient refuses. Using prescribed eye drops, ? Name of medication, used for abnormal eye pressure.      HPI: Patient is a 61 y.o. female for routine follow up.  She has not seen dentist yet.   States she is working on minimizing her sugar.   Pap due with next visit.     Review of Systems:  ROS***  Past Medical History:  Diagnosis Date  . 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 Anemia   . Anxiety   . Eczema   . Environmental allergies   . GERD (gastroesophageal reflux disease)    Patient denies  . Headache(784.0)   . Hx of adenomatous colonic polyps   . Hyperlipidemia   . Hypertension   . Moderate intermittent asthma   . Pre-diabetes   . Respiratory failure, acute ([place]) 04/19/2014   Related to potential Anaphylaxis ? presumably to ASA - exacerbated by Moderate to Severe Asthma   Past  Surgical History:  Procedure Laterality Date  . [place]    . NASAL SINUS SURGERY     Polyps  . POLYPECTOMY    . TRANSTHORACIC ECHOCARDIOGRAM  04/2014   in setting of cardiopulmonary arrest): EF 65 to 70%.  Mild concentric LVH.  GR 1 DD.  Mild AI.;;  August 2019: EF 55 to 60%.  Mild LVH.  GR 1 DD.  Mild AI/MR.  Mildly increased PA pressures.   Social History:   reports that she has never smoked. She has never used smokeless tobacco. She reports that she does not drink alcohol and does not use drugs.  Family History  Problem Relation Age of Onset  . Heart attack Father   . Heart attack Mother   . Diabetes Sister   . High blood pressure Brother   . Hypertension Brother   . Asthma Brother   . High blood pressure Sister   . Cancer Sister   . Lung cancer Sister   . High blood pressure Son   . Cancer Maternal Grandmother   . Heart disease Paternal Grandmother   . Pancreatic cancer Paternal Grandmother   . Asthma Other        Great paternal grandfather   . Autism Son   . Asthma Other        Neice   . Colon cancer Neg Hx   .  Esophageal cancer Neg Hx   . Rectal cancer Neg Hx   . Stomach cancer Neg Hx   . Allergic rhinitis Neg Hx   . Angioedema Neg Hx   . Eczema Neg Hx   . Immunodeficiency Neg Hx   . Atopy Neg Hx   . Colon polyps Neg Hx     Medications: Patient's Medications  New Prescriptions   No medications on file  Previous Medications   ACETAMINOPHEN (TYLENOL) 160 MG/5ML SUSPENSION    Take by mouth every 6 (six) hours as needed.   ALBUTEROL (VENTOLIN HFA) 108 (90 BASE) MCG/ACT INHALER    INHALE 2 PUFFS INTO THE LUNGS EVERY 6 HOURS AS NEEDED FOR WHEEZING OR SHORTNESS OF BREATH   AMLODIPINE (NORVASC) 10 MG TABLET    TAKE 1 TABLET BY MOUTH EVERY DAY FOR HIGH BLOOD PRESSURE   BISOPROLOL (ZEBETA) 5 MG TABLET    TAKE 1/2 TABLET BY MOUTH EVERY DAY FOR HIGH BLOOD PRESSURE   CETIRIZINE (ZYRTEC) 10 MG TABLET    TAKE 1  TABLET(10 MG) BY MOUTH DAILY   DUPILUMAB (DUPIXENT) 200 MG/1.14ML PREFILLED SYRINGE    Inject 300 mg into the skin every 14 (fourteen) days.   DUPIXENT 300 MG/2ML PREFILLED SYRINGE    INJECT '300MG'$  SUBCUTANEOUSLY EVERY OTHER WEEK   EPINEPHRINE 0.3 MG/0.3 ML IJ SOAJ INJECTION    ADMINISTER 0.3 MG IN THE MUSCLE 1 TIME FOR 1 DOSE   EZETIMIBE (ZETIA) 10 MG TABLET    TAKE 1 TABLET(10 MG) BY MOUTH DAILY   FLUTICASONE (FLONASE) 50 MCG/ACT NASAL SPRAY    SHAKE LIQUID AND USE 2 SPRAYS IN EACH NOSTRIL DAILY   FLUTICASONE-SALMETEROL (WIXELA INHUB) 250-50 MCG/ACT AEPB    Inhale 1 puff into the lungs in the morning and at bedtime.   HYDRALAZINE (APRESOLINE) 25 MG TABLET    TAKE 1 TABLET BY MOUTH EVERY 8 HOURS   OMEGA-3 FATTY ACIDS (FISH OIL) 500 MG CAPS    Take 1 capsule by mouth daily.   POTASSIUM CHLORIDE SA (KLOR-CON M) 20 MEQ TABLET    TAKE 1 TABLET BY MOUTH DAILY   TRIAMCINOLONE OINTMENT (KENALOG) 0.5 %    APPLY TOPICALLY TO THE AFFECTED AREA(S) TWICE DAILY  Modified Medications   No medications on file  Discontinued Medications   No medications on file    Physical Exam:  Vitals:   02/25/22 1045  BP: 126/78  Pulse: 69  Temp: (!) 97.1 F (36.2 C)  TempSrc: Temporal  SpO2: 97%  Weight: 150 lb (68 kg)  Height: '4\' 9"'$  (1.448 m)   Body mass index is 32.46 kg/m. Wt Readings from Last 3 Encounters:  02/25/22 150 lb (68 kg)  10/16/21 152 lb (68.9 kg)  08/27/21 150 lb (68 kg)    Physical Exam***  Labs reviewed: Basic Metabolic Panel: Recent Labs    08/27/21 1214  NA 142  K 3.3*  CL 106  CO2 30  GLUCOSE 93  BUN 10  CREATININE 1.03  CALCIUM 9.4   Liver Function Tests: Recent Labs    08/27/21 1214  AST 19  ALT 20  BILITOT 0.7  PROT 7.5   No results for input(s): "LIPASE", "AMYLASE" in the last 8760 hours. No results for input(s): "AMMONIA" in the last 8760 hours. CBC: Recent Labs    08/27/21 1214  WBC 5.6  NEUTROABS 2,688  HGB 13.7  HCT 41.3  MCV 82.4  PLT 335    Lipid Panel: Recent Labs    08/27/21 1214  CHOL  146  HDL 47*  LDLCALC 75  TRIG 153*  CHOLHDL 3.1   TSH: No results for input(s): "TSH" in the last 8760 hours. A1C: Lab Results  Component Value Date   HGBA1C 5.9 (H) 08/27/2021     Assessment/Plan There are no diagnoses linked to this encounter.  No follow-ups on file.: *** [name] K. [place], [facility] [phone]

## 2022-02-26 LAB — CBC WITH DIFFERENTIAL/PLATELET
Absolute Monocytes: 469 cells/uL (ref 200–950)
Basophils Absolute: 51 cells/uL (ref 0–200)
Basophils Relative: 1 %
Eosinophils Absolute: 209 cells/uL (ref 15–500)
Eosinophils Relative: 4.1 %
HCT: 39.7 % (ref 35.0–45.0)
Hemoglobin: 13.2 g/dL (ref 11.7–15.5)
Lymphs Abs: 2213 cells/uL (ref 850–3900)
MCH: 27.1 pg (ref 27.0–33.0)
MCHC: 33.2 g/dL (ref 32.0–36.0)
MCV: 81.5 fL (ref 80.0–100.0)
MPV: 10 fL (ref 7.5–12.5)
Monocytes Relative: 9.2 %
Neutro Abs: 2157 cells/uL (ref 1500–7800)
Neutrophils Relative %: 42.3 %
Platelets: 396 10*3/uL (ref 140–400)
RBC: 4.87 10*6/uL (ref 3.80–5.10)
RDW: 13.3 % (ref 11.0–15.0)
Total Lymphocyte: 43.4 %
WBC: 5.1 10*3/uL (ref 3.8–10.8)

## 2022-02-26 LAB — COMPLETE METABOLIC PANEL WITH GFR
AG Ratio: 1.1 (calc) (ref 1.0–2.5)
ALT: 22 U/L (ref 6–29)
AST: 20 U/L (ref 10–35)
Albumin: 3.9 g/dL (ref 3.6–5.1)
Alkaline phosphatase (APISO): 90 U/L (ref 37–153)
BUN: 10 mg/dL (ref 7–25)
CO2: 24 mmol/L (ref 20–32)
Calcium: 9.5 mg/dL (ref 8.6–10.4)
Chloride: 108 mmol/L (ref 98–110)
Creat: 1 mg/dL (ref 0.50–1.05)
Globulin: 3.4 g/dL (calc) (ref 1.9–3.7)
Glucose, Bld: 95 mg/dL (ref 65–99)
Potassium: 3.7 mmol/L (ref 3.5–5.3)
Sodium: 142 mmol/L (ref 135–146)
Total Bilirubin: 0.5 mg/dL (ref 0.2–1.2)
Total Protein: 7.3 g/dL (ref 6.1–8.1)
eGFR: 63 mL/min/{1.73_m2} (ref >=60)

## 2022-02-26 LAB — LIPID PANEL
Cholesterol: 148 mg/dL (ref <200)
HDL: 42 mg/dL — ABNORMAL LOW (ref >=50)
LDL Cholesterol (Calc): 79 mg/dL (calc)
Non-HDL Cholesterol (Calc): 106 mg/dL (calc) (ref <130)
Total CHOL/HDL Ratio: 3.5 (calc) (ref <5.0)
Triglycerides: 175 mg/dL — ABNORMAL HIGH (ref <150)

## 2022-02-26 LAB — HEMOGLOBIN A1C
Hgb A1c MFr Bld: 5.9 % of total Hgb — ABNORMAL HIGH (ref <5.7)
Mean Plasma Glucose: 123 mg/dL
eAG (mmol/L): 6.8 mmol/L

## 2022-03-04 ENCOUNTER — Ambulatory Visit (INDEPENDENT_AMBULATORY_CARE_PROVIDER_SITE_OTHER): Payer: Medicare Other

## 2022-03-04 DIAGNOSIS — J4550 Severe persistent asthma, uncomplicated: Secondary | ICD-10-CM | POA: Diagnosis not present

## 2022-03-18 ENCOUNTER — Ambulatory Visit (INDEPENDENT_AMBULATORY_CARE_PROVIDER_SITE_OTHER): Payer: Medicare Other

## 2022-03-18 DIAGNOSIS — J4550 Severe persistent asthma, uncomplicated: Secondary | ICD-10-CM | POA: Diagnosis not present

## 2022-03-21 ENCOUNTER — Encounter: Payer: Medicare Other | Admitting: Nurse Practitioner

## 2022-03-25 ENCOUNTER — Encounter: Payer: Self-pay | Admitting: Nurse Practitioner

## 2022-03-25 ENCOUNTER — Encounter: Payer: Medicare Other | Admitting: Nurse Practitioner

## 2022-03-25 ENCOUNTER — Ambulatory Visit (INDEPENDENT_AMBULATORY_CARE_PROVIDER_SITE_OTHER): Payer: Medicare Other | Admitting: Nurse Practitioner

## 2022-03-25 ENCOUNTER — Telehealth: Payer: Self-pay

## 2022-03-25 DIAGNOSIS — E876 Hypokalemia: Secondary | ICD-10-CM | POA: Diagnosis not present

## 2022-03-25 DIAGNOSIS — Z0000 Encounter for general adult medical examination without abnormal findings: Secondary | ICD-10-CM

## 2022-03-25 DIAGNOSIS — E2839 Other primary ovarian failure: Secondary | ICD-10-CM

## 2022-03-25 DIAGNOSIS — I10 Essential (primary) hypertension: Secondary | ICD-10-CM | POA: Diagnosis not present

## 2022-03-25 MED ORDER — EZETIMIBE 10 MG PO TABS: ORAL_TABLET | ORAL | 5 refills | Status: DC

## 2022-03-25 MED ORDER — AMLODIPINE BESYLATE 10 MG PO TABS: 10.0000 mg | ORAL_TABLET | Freq: Every day | ORAL | 1 refills | Status: DC

## 2022-03-25 MED ORDER — FLUTICASONE PROPIONATE 50 MCG/ACT NA SUSP: NASAL | 5 refills | Status: DC

## 2022-03-25 MED ORDER — BISOPROLOL FUMARATE 5 MG PO TABS: ORAL_TABLET | ORAL | 3 refills | Status: DC

## 2022-03-25 MED ORDER — HYDRALAZINE HCL 25 MG PO TABS: 25.0000 mg | ORAL_TABLET | Freq: Three times a day (TID) | ORAL | 5 refills | Status: DC

## 2022-03-25 MED ORDER — POTASSIUM CHLORIDE CRYS ER 20 MEQ PO TBCR: 20.0000 meq | EXTENDED_RELEASE_TABLET | Freq: Every day | ORAL | 5 refills | Status: DC

## 2022-03-25 NOTE — Progress Notes (Signed)
Subjective:   Tammie [name] is a 61 y.o. female who presents for Medicare Annual (Subsequent) preventive examination.  Review of Systems     Cardiac Risk Factors include: advanced age (>41mn, >>53women);diabetes mellitus;hypertension;dyslipidemia;obesity (BMI >30kg/m2);family history of premature cardiovascular disease;sedentary lifestyle     Objective:    Today's Vitals   03/25/22 1055  PainSc: 4    There is no height or weight on file to calculate BMI.     03/25/2022   10:30 AM 02/25/2022   10:52 AM 08/27/2021   10:40 AM 03/20/2021    1:15 PM 02/09/2021    9:18 AM 08/11/2020   10:02 AM 05/10/2020   10:22 AM  Advanced Directives  Does Patient Have a Medical Advance Directive? No No No No No No No  Would patient like information on creating a medical advance directive? No - Patient declined No - Patient declined Yes (MAU/Ambulatory/Procedural Areas - Information given)  No - Patient declined Yes (MAU/Ambulatory/Procedural Areas - Information given) Yes (MAU/Ambulatory/Procedural Areas - Information given)    Current Medications (verified) Outpatient Encounter Medications as of 03/25/2022  Medication Sig   acetaminophen (TYLENOL) 160 MG/5ML suspension Take by mouth every 6 (six) hours as needed.   albuterol (VENTOLIN HFA) 108 (90 Base) MCG/ACT inhaler INHALE 2 PUFFS INTO THE LUNGS EVERY 6 HOURS AS NEEDED FOR WHEEZING OR SHORTNESS OF BREATH   cetirizine (ZYRTEC) 10 MG tablet TAKE 1 TABLET(10 MG) BY MOUTH DAILY   dupilumab (DUPIXENT) 200 MG/1.14ML prefilled syringe Inject 300 mg into the skin every 14 (fourteen) days.   DUPIXENT 300 MG/2ML prefilled syringe INJECT '300MG'$  SUBCUTANEOUSLY EVERY OTHER WEEK   EPINEPHrine 0.3 mg/0.3 mL IJ SOAJ injection ADMINISTER 0.3 MG IN THE MUSCLE 1 TIME FOR 1 DOSE   fluticasone-salmeterol (WIXELA INHUB) 250-50 MCG/ACT AEPB Inhale 1 puff into the lungs in the morning and at bedtime.   Omega-3 Fatty Acids (FISH OIL) 500 MG CAPS Take 1 capsule by mouth  daily.   triamcinolone ointment (KENALOG) 0.5 % APPLY TOPICALLY TO THE AFFECTED AREA(S) TWICE DAILY   [DISCONTINUED] amLODipine (NORVASC) 10 MG tablet TAKE 1 TABLET BY MOUTH EVERY DAY FOR HIGH BLOOD PRESSURE   [DISCONTINUED] bisoprolol (ZEBETA) 5 MG tablet TAKE 1/2 TABLET BY MOUTH EVERY DAY FOR HIGH BLOOD PRESSURE   [DISCONTINUED] ezetimibe (ZETIA) 10 MG tablet TAKE 1 TABLET(10 MG) BY MOUTH DAILY   [DISCONTINUED] fluticasone (FLONASE) 50 MCG/ACT nasal spray SHAKE LIQUID AND USE 2 SPRAYS IN EACH NOSTRIL DAILY   [DISCONTINUED] hydrALAZINE (APRESOLINE) 25 MG tablet TAKE 1 TABLET BY MOUTH EVERY 8 HOURS   [DISCONTINUED] potassium chloride SA (KLOR-CON M) 20 MEQ tablet TAKE 1 TABLET BY MOUTH DAILY   amLODipine (NORVASC) 10 MG tablet Take 1 tablet (10 mg total) by mouth daily. for high blood pressure   bisoprolol (ZEBETA) 5 MG tablet TAKE 1/2 TABLET BY MOUTH EVERY DAY FOR HIGH BLOOD PRESSURE   ezetimibe (ZETIA) 10 MG tablet TAKE 1 TABLET(10 MG) BY MOUTH DAILY   fluticasone (FLONASE) 50 MCG/ACT nasal spray SHAKE LIQUID AND USE 2 SPRAYS IN EACH NOSTRIL DAILY   hydrALAZINE (APRESOLINE) 25 MG tablet Take 1 tablet (25 mg total) by mouth every 8 (eight) hours.   potassium chloride SA (KLOR-CON M) 20 MEQ tablet Take 1 tablet (20 mEq total) by mouth daily.   [DISCONTINUED] dupilumab (DUPIXENT) prefilled syringe 300 mg    No facility-administered encounter medications on file as of 03/25/2022.    Allergies (verified) Ace inhibitors, Aspirin, Doxepin, Ibuprofen, Nsaids, and Lipitor [atorvastatin]  History: Past Medical History:  Diagnosis Date   Allergy    Anaphylaxis to ASA   Anemia    Anxiety    Eczema    Environmental allergies    GERD (gastroesophageal reflux disease)    Patient denies   Headache(784.0)    Hx of adenomatous colonic polyps    Hyperlipidemia    Hypertension    Moderate intermittent asthma    Pre-diabetes    Respiratory failure, acute ([place]) 04/19/2014   Related to potential  Anaphylaxis ? presumably to ASA - exacerbated by Moderate to Severe Asthma   Past Surgical History:  Procedure Laterality Date   [facility]    COLONOSCOPY     NASAL SINUS SURGERY     Polyps   POLYPECTOMY     TRANSTHORACIC ECHOCARDIOGRAM  04/2014   in setting of cardiopulmonary arrest): EF 65 to 70%.  Mild concentric LVH.  GR 1 DD.  Mild AI.;;  August 2019: EF 55 to 60%.  Mild LVH.  GR 1 DD.  Mild AI/MR.  Mildly increased PA pressures.   Family History  Problem Relation Age of Onset   Heart attack Father    Heart attack Mother    Diabetes Sister    High blood pressure Brother    Hypertension Brother    Asthma Brother    High blood pressure Sister    Cancer Sister    Lung cancer Sister    High blood pressure Son    Cancer Maternal Grandmother    Heart disease Paternal Grandmother    Pancreatic cancer Paternal Grandmother    Asthma Other        Great paternal grandfather    Autism Son    Asthma Other        Neice    Colon cancer Neg Hx    Esophageal cancer Neg Hx    Rectal cancer Neg Hx    Stomach cancer Neg Hx    Allergic rhinitis Neg Hx    Angioedema Neg Hx    Eczema Neg Hx    Immunodeficiency Neg Hx    Atopy Neg Hx    Colon polyps Neg Hx    Social History   Socioeconomic History   Marital status: Single    Spouse name: Not on file   Number of children: Not on file   Years of education: Not on file   Highest education level: Not on file  Occupational History   Not on file  Tobacco Use   Smoking status: Never   Smokeless tobacco: Never  Vaping Use   Vaping Use: Never used  Substance and Sexual Activity   Alcohol use: No   Drug use: No   Sexual activity: Not Currently  Other Topics Concern   Not on file  Social History Narrative   Diet-Salt Free   Drinks Soda-Caffeine    Lives in an apartment, one stories, one person in home, no pets   Current/past profession: Dietary, dish-room, and cooks    Exercises 2 x weekly    Social Determinants of Health   Financial Resource Strain: Low Risk  (03/10/2018)   Overall Financial Resource Strain (CARDIA)    Difficulty of Paying Living Expenses: Not hard at all  Food Insecurity: No Food Insecurity (03/10/2018)   Hunger Vital Sign    Worried About Running Out of Food in the Last Year: Never true    Ran Out of Food in the Last Year: Never true  Transportation Needs: No Transportation Needs (03/10/2018)   PRAPARE - Hydrologist (Medical): No    Lack of Transportation (Non-Medical): No  Physical Activity: Inactive (03/10/2018)   Exercise Vital Sign    Days of Exercise per Week: 0 days    Minutes of Exercise per Session: 0 min  Stress: No Stress Concern Present (03/10/2018)   [place]    Feeling of Stress : Only a little  Social Connections: Somewhat Isolated (03/10/2018)   Social Connection and Isolation Panel [NHANES]    Frequency of Communication with Friends and Family: More than three times a week    Frequency of Social Gatherings with Friends and Family: More than three times a week    Attends Religious Services: More than 4 times per year    Active Member of [employer] or Organizations: No    Attends Music therapist: Never    Marital Status: Never married    Tobacco Counseling Counseling given: Not Answered   Clinical Intake:  Pre-visit preparation completed: Yes  Pain : 0-10 Pain Score: 4  Pain Type: Chronic pain Pain Location: Neck     BMI - recorded: 32 Nutritional Status: BMI > 30  Obese  How often do you need to have someone help you when you read instructions, pamphlets, or other written materials from your doctor or pharmacy?: 1 - Never  Diabetic?yes         Activities of Daily Living    03/25/2022   10:50 AM  In your present state of health, do you have any difficulty performing the following activities:  Hearing? 0  Vision? 0   Difficulty concentrating or making decisions? 0  Walking or climbing stairs? 0  Dressing or bathing? 0  Doing errands, shopping? 0  Preparing Food and eating ? N  Using the Toilet? N  In the past six months, have you accidently leaked urine? N  Do you have problems with loss of bowel control? N  Managing your Medications? N  Managing your Finances? N  Housekeeping or managing your Housekeeping? N    Patient Care Team: [name], NP as PCP - General (Geriatric Medicine) [name], MD as Consulting Physician (Ophthalmology) Margaretha Seeds, MD as Consulting Physician (Pulmonary Disease) Valentina Shaggy, MD as Consulting Physician (Allergy and Immunology)  Indicate any recent Medical Services you may have received from other than [facility] providers in the past year (date may be approximate).     Assessment:   This is a routine wellness examination for Tammie [name].  Hearing/Vision screen Hearing Screening - Comments:: No hearing issues  Vision Screening - Comments:: Last eye exam less than 12 months ago. Dr. Katy Fitch  Dietary issues and exercise activities discussed: Current Exercise Habits: The patient does not participate in regular exercise at present   Goals Addressed   None    Depression Screen    03/25/2022   10:42 AM 02/25/2022   10:49 AM 08/27/2021   11:37 AM 03/20/2021    1:13 PM 08/11/2020   10:02 AM 05/10/2020   10:24 AM 03/14/2020   10:48 AM  PHQ 2/9 Scores  PHQ - 2 Score 0 0 0 0 0 0 0    Fall Risk    03/25/2022   10:42 AM 02/25/2022   10:49 AM 08/27/2021   11:37 AM 03/20/2021    1:15 PM 08/11/2020   10:02 AM  Fall Risk   Falls in the  past year? 0 0 0 0 0  Number falls in past yr: 0 0 0 0 0  Injury with Fall? 0 0 0 0 0  Risk for fall due to : No Fall Risks No Fall Risks No Fall Risks No Fall Risks   Follow up Falls evaluation completed Falls evaluation completed Falls evaluation completed Falls evaluation completed     [place]:  Any stairs in or around the home? No  If so, are there any without handrails?  na Home free of loose throw rugs in walkways, pet beds, electrical cords, etc? Yes  Adequate lighting in your home to reduce risk of falls? Yes   ASSISTIVE DEVICES UTILIZED TO PREVENT FALLS:  Life alert? No  Use of a cane, walker or w/c? No  Grab bars in the bathroom? No  Shower chair or bench in shower? No  Elevated toilet seat or a handicapped toilet? No   TIMED UP AND GO:  Was the test performed? No .    Cognitive Function:        03/20/2021    1:17 PM 03/14/2020   10:50 AM  6CIT Screen  What Year? 0 points 0 points  What month? 0 points 0 points  What time? 0 points 0 points  Count back from 20 0 points 0 points  Months in reverse 2 points 0 points  Repeat phrase 4 points 4 points  Total Score 6 points 4 points    Immunizations Immunization History  Administered Date(s) Administered   Fluad Quad(high Dose 65+) 05/10/2020, 08/27/2021   Influenza Inj Mdck Quad Pf 06/29/2019   Influenza,inj,Quad PF,6+ Mos 05/26/2014, 06/21/2016, 05/27/2017, 05/19/2018, 03/30/2019   Influenza-Unspecified 05/06/2015, 05/23/2017   PFIZER(Purple Top)SARS-COV-2 Vaccination 10/21/2019, 11/16/2019, 06/23/2020, 02/14/2021   Pfizer Covid-19 Vaccine Bivalent Booster 72yr & up 06/08/2021   Pneumococcal Conjugate-13 12/17/2016   Pneumococcal Polysaccharide-23 03/12/2019, 06/29/2019   Tdap 07/22/2017   Zoster Recombinat (Shingrix) 12/17/2016, 02/19/2017    TDAP status: Up to date  Flu Vaccine status: Up to date  Pneumococcal vaccine status: Up to date  Covid-19 vaccine status: Information provided on how to obtain vaccines.   Qualifies for Shingles Vaccine? Yes   Zostavax completed Yes   Shingrix Completed?: Yes  Screening Tests Health Maintenance  Topic Date Due   INFLUENZA VACCINE  03/05/2022   PAP SMEAR-Modifier  03/11/2022   HEMOGLOBIN A1C  08/28/2022   OPHTHALMOLOGY  EXAM  09/17/2022   FOOT EXAM  02/26/2023   MAMMOGRAM  05/31/2023   COLONOSCOPY (Pts 45-420yrInsurance coverage will need to be confirmed)  02/07/2026   TETANUS/TDAP  07/23/2027   COVID-19 Vaccine  Completed   HIV Screening  Completed   Zoster Vaccines- Shingrix  Completed   HPV VACCINES  Aged Out   Hepatitis C Screening  Discontinued    Health Maintenance  Health Maintenance Due  Topic Date Due   INFLUENZA VACCINE  03/05/2022   PAP SMEAR-Modifier  03/11/2022    Colorectal cancer screening: Type of screening: Colonoscopy. Completed 2022. Repeat every 5 years  Mammogram status: Completed 05/30/21. Repeat every year   Bone Density status: Ordered today. Pt provided with contact info and advised to call to schedule appt.  Lung Cancer Screening: (Low Dose CT Chest recommended if Age 275-80ears, 30 pack-year currently smoking OR have quit w/in 15years.) does not qualify.   Lung Cancer Screening Referral: na  Additional Screening:  Hepatitis C Screening: does qualify; Completed 2022   Vision Screening:  Recommended annual ophthalmology exams for early detection of glaucoma and other disorders of the eye. Is the patient up to date with their annual eye exam?  Yes  Who is the provider or what is the name of the office in which the patient attends annual eye exams? [facility] If pt is not established with a provider, would they like to be referred to a provider to establish care? No .   Dental Screening: Recommended annual dental exams for proper oral hygiene  Community Resource Referral / Chronic Care Management: CRR required this visit?  No   CCM required this visit?  No      Plan:     I have personally reviewed and noted the following in the patient's chart:   Medical and social history Use of alcohol, tobacco or illicit drugs  Current medications and supplements including opioid prescriptions. Patient is not currently taking opioid prescriptions. Functional ability and  status Nutritional status Physical activity Advanced directives List of other physicians Hospitalizations, surgeries, and ER visits in previous 12 months Vitals Screenings to include cognitive, depression, and falls Referrals and appointments  In addition, I have reviewed and discussed with patient certain preventive protocols, quality metrics, and best practice recommendations. A written personalized care plan for preventive services as well as general preventive health recommendations were provided to patient.     [name], NP   03/25/2022    Virtual Visit via Telephone Note  I connected with patient 03/25/22 at 10:40 AM EDT by telephone and verified that I am speaking with the correct person using two identifiers.  Location: Patient: home Provider: office   I discussed the limitations, risks, security and privacy concerns of performing an evaluation and management service by telephone and the availability of in person appointments. I also discussed with the patient that there may be a patient responsible charge related to this service. The patient expressed understanding and agreed to proceed.   I discussed the assessment and treatment plan with the patient. The patient was provided an opportunity to ask questions and all were answered. The patient agreed with the plan and demonstrated an understanding of the instructions.   The patient was advised to call back or seek an in-person evaluation if the symptoms worsen or if the condition fails to improve as anticipated.  I provided 15 minutes of non-face-to-face time during this encounter.  Carlos American. Harle Battiest Avs printed and mailed

## 2022-03-25 NOTE — Patient Instructions (Signed)
Tammie [name] , Thank you for taking time to come for your Medicare Wellness Visit. I appreciate your ongoing commitment to your health goals. Please review the following plan we discussed and let me know if I can assist you in the future.   Screening recommendations/referrals: Colonoscopy up to date Mammogram to call [phone] to schedule next appt Bone Density to call [phone] to schedule appt Recommended yearly ophthalmology/optometry visit for glaucoma screening and checkup Recommended yearly dental visit for hygiene and checkup  Vaccinations: Influenza vaccine to obtain at office or pharmacy in September or October Pneumococcal vaccine up to date Tdap vaccine up to date Shingles vaccine up to date   COVID vaccine. To obtain at local pharmacy  Advanced directives: recommended to complete  Conditions/risks identified: diabetes, htn, hyperlipidemia.   Next appointment: yearly for awv  Preventive Care 40-64 Years, Female Preventive care refers to lifestyle choices and visits with your health care provider that can promote health and wellness. What does preventive care include? A yearly physical exam. This is also called an annual well check. Dental exams once or twice a year. Routine eye exams. Ask your health care provider how often you should have your eyes checked. Personal lifestyle choices, including: Daily care of your teeth and gums. Regular physical activity. Eating a healthy diet. Avoiding tobacco and drug use. Limiting alcohol use. Practicing safe sex. Taking low-dose aspirin daily starting at age 62. Taking vitamin and mineral supplements as recommended by your health care provider. What happens during an annual well check? The services and screenings done by your health care provider during your annual well check will depend on your age, overall health, lifestyle risk factors, and family history of disease. Counseling  Your health care provider may ask you  questions about your: Alcohol use. Tobacco use. Drug use. Emotional well-being. Home and relationship well-being. Sexual activity. Eating habits. Work and work Statistician. Method of birth control. Menstrual cycle. Pregnancy history. Screening  You may have the following tests or measurements: Height, weight, and BMI. Blood pressure. Lipid and cholesterol levels. These may be checked every 5 years, or more frequently if you are over 56 years old. Skin check. Lung cancer screening. You may have this screening every year starting at age 57 if you have a 30-pack-year history of smoking and currently smoke or have quit within the past 15 years. Fecal occult blood test (FOBT) of the stool. You may have this test every year starting at age 67. Flexible sigmoidoscopy or colonoscopy. You may have a sigmoidoscopy every 5 years or a colonoscopy every 10 years starting at age 65. Hepatitis C blood test. Hepatitis B blood test. Sexually transmitted disease (STD) testing. Diabetes screening. This is done by checking your blood sugar (glucose) after you have not eaten for a while (fasting). You may have this done every 1-3 years. Mammogram. This may be done every 1-2 years. Talk to your health care provider about when you should start having regular mammograms. This may depend on whether you have a family history of breast cancer. BRCA-related cancer screening. This may be done if you have a family history of breast, ovarian, tubal, or peritoneal cancers. Pelvic exam and Pap test. This may be done every 3 years starting at age 9. Starting at age 52, this may be done every 5 years if you have a Pap test in combination with an HPV test. Bone density scan. This is done to screen for osteoporosis. You may have this scan if you are at  high risk for osteoporosis. Discuss your test results, treatment options, and if necessary, the need for more tests with your health care provider. Vaccines  Your health  care provider may recommend certain vaccines, such as: Influenza vaccine. This is recommended every year. Tetanus, diphtheria, and acellular pertussis (Tdap, Td) vaccine. You may need a Td booster every 10 years. Zoster vaccine. You may need this after age 40. Pneumococcal 13-valent conjugate (PCV13) vaccine. You may need this if you have certain conditions and were not previously vaccinated. Pneumococcal polysaccharide (PPSV23) vaccine. You may need one or two doses if you smoke cigarettes or if you have certain conditions. Talk to your health care provider about which screenings and vaccines you need and how often you need them. This information is not intended to replace advice given to you by your health care provider. Make sure you discuss any questions you have with your health care provider. Document Released: 08/18/2015 Document Revised: 04/10/2016 Document Reviewed: 05/23/2015 Elsevier Interactive Patient Education  2017 [place] Prevention in the Home Falls can cause injuries. They can happen to people of all ages. There are many things you can do to make your home safe and to help prevent falls. What can I do on the outside of my home? Regularly fix the edges of walkways and driveways and fix any cracks. Remove anything that might make you trip as you walk through a door, such as a raised step or threshold. Trim any bushes or trees on the path to your home. Use bright outdoor lighting. Clear any walking paths of anything that might make someone trip, such as rocks or tools. Regularly check to see if handrails are loose or broken. Make sure that both sides of any steps have handrails. Any raised decks and porches should have guardrails on the edges. Have any leaves, snow, or ice cleared regularly. Use sand or salt on walking paths during winter. Clean up any spills in your garage right away. This includes oil or grease spills. What can I do in the bathroom? Use  night lights. Install grab bars by the toilet and in the tub and shower. Do not use towel bars as grab bars. Use non-skid mats or decals in the tub or shower. If you need to sit down in the shower, use a plastic, non-slip stool. Keep the floor dry. Clean up any water that spills on the floor as soon as it happens. Remove soap buildup in the tub or shower regularly. Attach bath mats securely with double-sided non-slip rug tape. Do not have throw rugs and other things on the floor that can make you trip. What can I do in the bedroom? Use night lights. Make sure that you have a light by your bed that is easy to reach. Do not use any sheets or blankets that are too big for your bed. They should not hang down onto the floor. Have a firm chair that has side arms. You can use this for support while you get dressed. Do not have throw rugs and other things on the floor that can make you trip. What can I do in the kitchen? Clean up any spills right away. Avoid walking on wet floors. Keep items that you use a lot in easy-to-reach places. If you need to reach something above you, use a strong step stool that has a grab bar. Keep electrical cords out of the way. Do not use floor polish or wax that makes floors slippery. If  you must use wax, use non-skid floor wax. Do not have throw rugs and other things on the floor that can make you trip. What can I do with my stairs? Do not leave any items on the stairs. Make sure that there are handrails on both sides of the stairs and use them. Fix handrails that are broken or loose. Make sure that handrails are as long as the stairways. Check any carpeting to make sure that it is firmly attached to the stairs. Fix any carpet that is loose or worn. Avoid having throw rugs at the top or bottom of the stairs. If you do have throw rugs, attach them to the floor with carpet tape. Make sure that you have a light switch at the top of the stairs and the bottom of the  stairs. If you do not have them, ask someone to add them for you. What else can I do to help prevent falls? Wear shoes that: Do not have high heels. Have rubber bottoms. Are comfortable and fit you well. Are closed at the toe. Do not wear sandals. If you use a stepladder: Make sure that it is fully opened. Do not climb a closed stepladder. Make sure that both sides of the stepladder are locked into place. Ask someone to hold it for you, if possible. Clearly mark and make sure that you can see: Any grab bars or handrails. First and last steps. Where the edge of each step is. Use tools that help you move around (mobility aids) if they are needed. These include: Canes. Walkers. Scooters. Crutches. Turn on the lights when you go into a dark area. Replace any light bulbs as soon as they burn out. Set up your furniture so you have a clear path. Avoid moving your furniture around. If any of your floors are uneven, fix them. If there are any pets around you, be aware of where they are. Review your medicines with your doctor. Some medicines can make you feel dizzy. This can increase your chance of falling. Ask your doctor what other things that you can do to help prevent falls. This information is not intended to replace advice given to you by your health care provider. Make sure you discuss any questions you have with your health care provider. Document Released: 05/18/2009 Document Revised: 12/28/2015 Document Reviewed: 08/26/2014 Elsevier Interactive Patient Education  2017 [employer].

## 2022-03-25 NOTE — Progress Notes (Signed)
   This service is provided via telemedicine  No vital signs collected/recorded due to the encounter was a telemedicine visit.   Location of patient (ex: home, work):  Home  Patient consents to a telephone visit: Yes, see telephone visit dated 03/25/22  Location of the provider (ex: office, home):  [facility], Office   Name of any referring provider:  N/A  Names of all persons participating in the telemedicine service and their role in the encounter:  S.Chrae B/CMA, Sherrie Mustache, NP, and Patient   Time spent on call:  9 min with medical assistant

## 2022-03-25 NOTE — Telephone Encounter (Signed)
[name], mctier are scheduled for a virtual visit with your provider today.    Just as we do with appointments in the office, we must obtain your consent to participate.  Your consent will be active for this visit and any virtual visit you may have with one of our providers in the next 365 days.    If you have a MyChart account, I can also send a copy of this consent to you electronically.  All virtual visits are billed to your insurance company just like a traditional visit in the office.  As this is a virtual visit, video technology does not allow for your provider to perform a traditional examination.  This may limit your provider's ability to fully assess your condition.  If your provider identifies any concerns that need to be evaluated in person or the need to arrange testing such as labs, EKG, etc, we will make arrangements to do so.    Although advances in technology are sophisticated, we cannot ensure that it will always work on either your end or our end.  If the connection with a video visit is poor, we may have to switch to a telephone visit.  With either a video or telephone visit, we are not always able to ensure that we have a secure connection.   I need to obtain your verbal consent now.   Are you willing to proceed with your visit today?   Tammie [name] has provided verbal consent on 03/25/2022 for a virtual visit (video or telephone).   [name], [place] 03/25/2022  10:30 am

## 2022-04-01 ENCOUNTER — Ambulatory Visit (INDEPENDENT_AMBULATORY_CARE_PROVIDER_SITE_OTHER): Payer: Medicare Other

## 2022-04-01 DIAGNOSIS — J4550 Severe persistent asthma, uncomplicated: Secondary | ICD-10-CM

## 2022-04-01 MED ORDER — DUPILUMAB 300 MG/2ML ~~LOC~~ SOSY
300.0000 mg | PREFILLED_SYRINGE | Freq: Once | SUBCUTANEOUS | Status: AC
  Administered 2022-04-01: 300 mg via SUBCUTANEOUS

## 2022-04-16 ENCOUNTER — Ambulatory Visit: Payer: Medicare Other

## 2022-04-16 ENCOUNTER — Encounter: Payer: Self-pay | Admitting: Allergy & Immunology

## 2022-04-16 ENCOUNTER — Ambulatory Visit (INDEPENDENT_AMBULATORY_CARE_PROVIDER_SITE_OTHER): Payer: Medicare Other | Admitting: Allergy & Immunology

## 2022-04-16 VITALS — BP 124/62 | HR 74 | Temp 98.1°F | Resp 24

## 2022-04-16 DIAGNOSIS — L309 Dermatitis, unspecified: Secondary | ICD-10-CM

## 2022-04-16 DIAGNOSIS — J4550 Severe persistent asthma, uncomplicated: Secondary | ICD-10-CM

## 2022-04-16 DIAGNOSIS — J3089 Other allergic rhinitis: Secondary | ICD-10-CM | POA: Diagnosis not present

## 2022-04-16 DIAGNOSIS — J339 Nasal polyp, unspecified: Secondary | ICD-10-CM | POA: Diagnosis not present

## 2022-04-16 MED ORDER — CETIRIZINE HCL 10 MG PO TABS: 10.0000 mg | ORAL_TABLET | Freq: Every day | ORAL | 2 refills | Status: DC

## 2022-04-16 MED ORDER — DUPILUMAB 300 MG/2ML ~~LOC~~ SOSY
300.0000 mg | PREFILLED_SYRINGE | SUBCUTANEOUS | Status: DC
  Administered 2022-04-16 – 2023-03-18 (×23): 300 mg via SUBCUTANEOUS

## 2022-04-16 MED ORDER — ALBUTEROL SULFATE HFA 108 (90 BASE) MCG/ACT IN AERS: 2.0000 | INHALATION_SPRAY | Freq: Four times a day (QID) | RESPIRATORY_TRACT | 2 refills | Status: DC | PRN

## 2022-04-16 MED ORDER — FLUTICASONE-SALMETEROL 250-50 MCG/ACT IN AEPB: 1.0000 | INHALATION_SPRAY | Freq: Two times a day (BID) | RESPIRATORY_TRACT | 5 refills | Status: DC

## 2022-04-16 MED ORDER — FLUTICASONE PROPIONATE 50 MCG/ACT NA SUSP: NASAL | 5 refills | Status: DC

## 2022-04-16 NOTE — Progress Notes (Unsigned)
FOLLOW UP  Date of Service/Encounter:  04/16/22   Assessment:   Severe persistent asthma with restrictive spirometry - improved spiro today   Chronic rhinitis (grasses, molds)   Disorder of respiratory system exacerbated by aspirin   Nasal polyposis (L>R) - markedly improved on Dupixent (now changed to Nucala with worsening eczema control   Eczema - not well controlled since changing away from [place]   Vaccinated against COVID-19, including booster    Plan/Recommendations:   1. Moderate persistent asthma, uncomplicated - Lung testing is a little bit worse.  - This is likely because your Grant Ruts has not been refilled in while.  - Daily controller medication(s): Wixela 250/50 mcg one puff twice daily + Dupixent every two weeks  - Prior to physical activity: albuterol 2 puffs 10-15 minutes before physical activity. - Rescue medications: albuterol 4 puffs every 4-6 hours as needed - Asthma control goals:  * Full participation in all desired activities (may need albuterol before activity) * Albuterol use two time or less a week on average (not counting use with activity) * Cough interfering with sleep two time or less a month * Oral steroids no more than once a year * No hospitalizations  2. Chronic rhinitis with nasal polyps - Continue with fluticasone two sprays per nostril twice daily. - Continue with cetirizine '10mg'$  daily.   3. Eczema versus contact dermatitis  - The Dupixent seems to have gotten your skin under better control.  - Your skin looks awesome today!   4. Return in about 3 months (around 07/16/2022).     Subjective:   Tammie [name] is a 61 y.o. female presenting today for follow up of  Chief Complaint  Patient presents with   Asthma    Tammie [name] has a history of the following: Patient Active Problem List   Diagnosis Date Noted   Type 2 diabetes mellitus with hyperglycemia ([place]) 29/93/7169   Metabolic syndrome 67/89/3810   Chest pain at  rest 11/29/2019   Mixed hyperlipidemia 11/10/2019   Hyperglycemia 11/10/2019   Dermatitis 06/22/2019   Chronic rhinitis 04/06/2019   Disorder of respiratory system exacerbated by aspirin 04/06/2019   Nasal polyposis 04/06/2019   Moderate persistent asthma without complication 17/51/0258   Generalized anxiety disorder 09/25/2016   Chronic seasonal allergic rhinitis 09/25/2016   Aortic valve regurgitation 09/25/2016   Other dysphagia 04/26/2014   Hypertension 04/18/2014    History obtained from: chart review and patient.  Tammie [name] is a 61 y.o. female presenting for a follow up visit.  She was last seen in March 2023.  At that time, we did not do lung testing.  We continued with Wixela 250 mcg 1 puff twice daily and Dupixent every 2 weeks.  For the chronic rhinitis with nasal polyps, we will continue with Flonase and cetirizine.  Since last visit, she has mostly done well.   Asthma/Respiratory Symptom History: She reports that the insurance company is giving her hell regarding the cost of the  [place]. She is on the Wixela one puff twice daily. She thinks that her breathing is doing fairly well now. Some days are worse than others. She reports that the heat has been a problem for her breathing. She tells me later that she has not been using her Wixela since she ran out a while ago.  She would like some refills of this.  She has followed with pulmonology in the past, but has not seen them in quite some time.  Allergic Rhinitis Symptom History: She  remains on her Flonase as well as her Zyrtec.  She does need refills of those.  She has not been on antibiotics at all.  Overall, she is doing fairly well.  Skin Symptom History: She remains on Dupixent every 2 weeks.  This is really helped her skin as well as her breathing.  She has not needed any systemic steroids or antibiotics for any flares.  Although she spends a lot of time complaining about the cost of the [place], eventually I do confirm that  she is getting it for free from the drug company.  She tells me today that she has been diagnosed with pre-diabetes.  She continues to follow with Dr. Katy Fitch for open-angle glaucoma.   Otherwise, there have been no changes to her past medical history, surgical history, family history, or social history.    Review of Systems  Constitutional: Negative.  Negative for chills, fever, malaise/fatigue and weight loss.  HENT:  Negative for congestion, ear discharge, ear pain and sinus pain.   Eyes:  Negative for pain, discharge and redness.  Respiratory:  Positive for cough and shortness of breath. Negative for sputum production and wheezing.   Cardiovascular: Negative.  Negative for chest pain and palpitations.  Gastrointestinal:  Negative for abdominal pain, constipation, diarrhea, heartburn, nausea and vomiting.  Skin: Negative.  Negative for itching and rash.  Neurological:  Negative for dizziness and headaches.  Endo/Heme/Allergies:  Negative for environmental allergies. Does not bruise/bleed easily.       Objective:   Blood pressure 124/62, pulse 74, temperature 98.1 F (36.7 C), temperature source Temporal, resp. rate (!) 24, SpO2 97 %. There is no height or weight on file to calculate BMI.    Physical Exam Vitals reviewed.  Constitutional:      General: She is not in acute distress.    Appearance: She is well-developed. She is not ill-appearing.     Comments: Smiling and talkative.  Very appreciative.  HENT:     Head: Normocephalic and atraumatic.     Right Ear: Tympanic membrane, ear canal and external ear normal.     Left Ear: Tympanic membrane, ear canal and external ear normal.     Nose: Mucosal edema and rhinorrhea present. No nasal deformity or septal deviation.     Right Turbinates: Enlarged, swollen and pale.     Left Turbinates: Enlarged, swollen and pale.     Right Sinus: No maxillary sinus tenderness or frontal sinus tenderness.     Left Sinus: No maxillary sinus  tenderness or frontal sinus tenderness.     Comments: Nasal polyps appear to be worsening on the left, although they are present bilaterally.    Mouth/Throat:     Mouth: Mucous membranes are not pale and not dry.     Pharynx: Uvula midline.     Tonsils: No tonsillar exudate or tonsillar abscesses. 1+ on the right. 1+ on the left.  Eyes:     General:        Right eye: No discharge.        Left eye: No discharge.     Conjunctiva/sclera: Conjunctivae normal.     Right eye: Right conjunctiva is not injected. No chemosis.    Left eye: Left conjunctiva is not injected. No chemosis.    Pupils: Pupils are equal, round, and reactive to light.  Cardiovascular:     Rate and Rhythm: Normal rate and regular rhythm.     Heart sounds: Normal heart sounds.  Pulmonary:  Effort: Pulmonary effort is normal. No tachypnea, accessory muscle usage or respiratory distress.     Breath sounds: Normal breath sounds. No wheezing, rhonchi or rales.     Comments: Moving air well in all lung fields.  No increased work of breathing. Chest:     Chest wall: No tenderness.  Lymphadenopathy:     Cervical: No cervical adenopathy.  Skin:    General: Skin is warm.     Capillary Refill: Capillary refill takes less than 2 seconds.     Coloration: Skin is not pale.     Findings: No abrasion, erythema, petechiae or rash. Rash is not papular, urticarial or vesicular.     Comments: Skin looks much better today.  There are few excoriations.  Neurological:     Mental Status: She is alert.  Psychiatric:        Behavior: Behavior is cooperative.      Diagnostic studies:    Spirometry: results abnormal (FEV1: 0./36%, FVC: 0.65/32%, FEV1/FVC: 91%).    KatieSpirometry consistent with possible restrictive disease.   Allergy Studies: none      Salvatore Marvel, MD  [facility]

## 2022-04-16 NOTE — Patient Instructions (Addendum)
1. Moderate persistent asthma, uncomplicated - Lung testing is a little bit worse.  - This is likely because your Grant Ruts has not been refilled in while.  - Daily controller medication(s): Wixela 250/50 mcg one puff twice daily + Dupixent every two weeks  - Prior to physical activity: albuterol 2 puffs 10-15 minutes before physical activity. - Rescue medications: albuterol 4 puffs every 4-6 hours as needed - Asthma control goals:  * Full participation in all desired activities (may need albuterol before activity) * Albuterol use two time or less a week on average (not counting use with activity) * Cough interfering with sleep two time or less a month * Oral steroids no more than once a year * No hospitalizations  2. Chronic rhinitis with nasal polyps - Continue with fluticasone two sprays per nostril twice daily. - Continue with cetirizine '10mg'$  daily.   3. Eczema versus contact dermatitis  - The Dupixent seems to have gotten your skin under better control.  - Your skin looks awesome today!   4. Return in about 3 months (around 07/16/2022).    Please inform us of any Emergency Department visits, hospitalizations, or changes in symptoms. Call us before going to the ED for breathing or allergy symptoms since we might be able to fit you in for a sick visit. Feel free to contact us anytime with any questions, problems, or concerns.  It was a pleasure to see you again today!  Websites that have reliable patient information: 1. American Academy of Asthma, Allergy, and Immunology: www.aaaai.org 2. Food Allergy Research and Education (FARE): foodallergy.org 3. Mothers of Asthmatics: http://www.asthmacommunitynetwork.org 4. American College of Allergy, Asthma, and Immunology: www.acaai.org   COVID-19 Vaccine Information can be found at: ShippingScam.co.uk For questions related to vaccine distribution or appointments, please email  [email or web address] or call [phone].   We realize that you might be concerned about having an allergic reaction to the COVID19 vaccines. To help with that concern, WE ARE OFFERING THE COVID19 VACCINES IN OUR OFFICE! Ask the front desk for dates!     "Like" [place] on Facebook and Instagram for our latest updates!      A healthy democracy works best when [employer] participate! Make sure you are registered to vote! If you have moved or changed any of your contact information, you will need to get this updated before voting!  In some cases, you MAY be able to register to vote online: [email or web address]

## 2022-04-17 ENCOUNTER — Encounter: Payer: Self-pay | Admitting: Allergy & Immunology

## 2022-05-02 ENCOUNTER — Ambulatory Visit (INDEPENDENT_AMBULATORY_CARE_PROVIDER_SITE_OTHER): Payer: Medicare Other

## 2022-05-02 DIAGNOSIS — J4550 Severe persistent asthma, uncomplicated: Secondary | ICD-10-CM

## 2022-05-17 ENCOUNTER — Ambulatory Visit (INDEPENDENT_AMBULATORY_CARE_PROVIDER_SITE_OTHER): Payer: Medicare Other

## 2022-05-17 DIAGNOSIS — J4550 Severe persistent asthma, uncomplicated: Secondary | ICD-10-CM | POA: Diagnosis not present

## 2022-05-27 ENCOUNTER — Ambulatory Visit (INDEPENDENT_AMBULATORY_CARE_PROVIDER_SITE_OTHER): Payer: Medicare Other | Admitting: Nurse Practitioner

## 2022-05-27 ENCOUNTER — Encounter: Payer: Self-pay | Admitting: Nurse Practitioner

## 2022-05-27 VITALS — BP 122/70 | HR 71 | Temp 97.1°F | Ht <= 58 in | Wt 151.0 lb

## 2022-05-27 DIAGNOSIS — J4540 Moderate persistent asthma, uncomplicated: Secondary | ICD-10-CM

## 2022-05-27 DIAGNOSIS — E782 Mixed hyperlipidemia: Secondary | ICD-10-CM | POA: Diagnosis not present

## 2022-05-27 DIAGNOSIS — E876 Hypokalemia: Secondary | ICD-10-CM | POA: Diagnosis not present

## 2022-05-27 DIAGNOSIS — Z23 Encounter for immunization: Secondary | ICD-10-CM

## 2022-05-27 DIAGNOSIS — J3089 Other allergic rhinitis: Secondary | ICD-10-CM

## 2022-05-27 DIAGNOSIS — K089 Disorder of teeth and supporting structures, unspecified: Secondary | ICD-10-CM

## 2022-05-27 DIAGNOSIS — I10 Essential (primary) hypertension: Secondary | ICD-10-CM

## 2022-05-27 DIAGNOSIS — E0865 Diabetes mellitus due to underlying condition with hyperglycemia: Secondary | ICD-10-CM | POA: Diagnosis not present

## 2022-05-27 DIAGNOSIS — Z124 Encounter for screening for malignant neoplasm of cervix: Secondary | ICD-10-CM

## 2022-05-27 NOTE — Progress Notes (Signed)
Careteam: Patient Care Team: [name], NP as PCP - General (Geriatric Medicine) [name], MD as Consulting Physician (Ophthalmology) Margaretha Seeds, MD as Consulting Physician (Pulmonary Disease) Valentina Shaggy, MD as Consulting Physician (Allergy and Immunology)  PLACE OF SERVICE:  [place]  Advanced Directive information Does Patient Have a Medical Advance Directive?: No, Would patient like information on creating a medical advance directive?: Yes (MAU/Ambulatory/Procedural Areas - Information given)  Allergies  Allergen Reactions   Ace Inhibitors Other (See Comments)    Angioedema   Aspirin Anaphylaxis, Swelling and Other (See Comments)    Throat swelling    Doxepin Anaphylaxis   Ibuprofen Anaphylaxis, Swelling and Other (See Comments)    Throat swelling   Nsaids Anaphylaxis, Swelling and Other (See Comments)   Lipitor [Atorvastatin] Swelling and Other (See Comments)    Face, feet swell    Chief Complaint  Patient presents with   Medical Management of Chronic Issues    3 month follow-up.Discuss RSV vaccine recommendations.  Discuss need for diabetic kidney evaluation and pap smear. FLu vaccine today/      HPI: Patient is a 61 y.o. female for routine follow up    Asthma: more wheezy with the change of the weather.  Follows with allergist. Is using albuterol inhaler about twice a day. Will see allergist next week  Poor dentition: says she has been having problems finding dentist because she wants one that specializes in diabetes and her other chronic problems. Found one that was rude. Also finds it hard to find the time. Discussed the importance of getting in with the dentist soon as rotting teeth can cause infection.   Follows with opthalmology. Recently missed appointments but says that she will call them.     Review of Systems:  Review of Systems  Constitutional:  Negative for fever and weight loss.  HENT:  Negative for  congestion, hearing loss and sore throat.   Eyes:  Negative for blurred vision.  Respiratory:  Positive for wheezing. Negative for cough and shortness of breath.   Cardiovascular:  Negative for chest pain, claudication and leg swelling.  Gastrointestinal:  Negative for abdominal pain, constipation, diarrhea and heartburn.  Genitourinary:  Negative for dysuria and hematuria.  Musculoskeletal:  Negative for falls and joint pain.  Skin:  Negative for itching and rash.  Neurological:  Negative for dizziness and headaches.  Endo/Heme/Allergies:  Positive for environmental allergies.  Psychiatric/Behavioral:  Negative for depression, memory loss and suicidal ideas. The patient is nervous/anxious.    Past Medical History:  Diagnosis Date   Allergy    Anaphylaxis to ASA   Anemia    Anxiety    Eczema    Environmental allergies    GERD (gastroesophageal reflux disease)    Patient denies   Headache(784.0)    Hx of adenomatous colonic polyps    Hyperlipidemia    Hypertension    Moderate intermittent asthma    Pre-diabetes    Pre-diabetes    Respiratory failure, acute ([place]) 04/19/2014   Related to potential Anaphylaxis ? presumably to ASA - exacerbated by Moderate to Severe Asthma   Past Surgical History:  Procedure Laterality Date   [facility]    COLONOSCOPY     NASAL SINUS SURGERY     Polyps   POLYPECTOMY     TRANSTHORACIC ECHOCARDIOGRAM  04/2014   in setting of cardiopulmonary arrest): EF 65 to 70%.  Mild concentric LVH.  GR 1  DD.  Mild AI.;;  August 2019: EF 55 to 60%.  Mild LVH.  GR 1 DD.  Mild AI/MR.  Mildly increased PA pressures.   Social History:   reports that she has never smoked. She has never used smokeless tobacco. She reports that she does not drink alcohol and does not use drugs.  Family History  Problem Relation Age of Onset   Heart attack Father    Heart attack Mother    Diabetes Sister    High blood pressure Brother     Hypertension Brother    Asthma Brother    High blood pressure Sister    Cancer Sister    Lung cancer Sister    High blood pressure Son    Cancer Maternal Grandmother    Heart disease Paternal Grandmother    Pancreatic cancer Paternal Grandmother    Asthma Other        Great paternal grandfather    Autism Son    Asthma Other        Neice    Colon cancer Neg Hx    Esophageal cancer Neg Hx    Rectal cancer Neg Hx    Stomach cancer Neg Hx    Allergic rhinitis Neg Hx    Angioedema Neg Hx    Eczema Neg Hx    Immunodeficiency Neg Hx    Atopy Neg Hx    Colon polyps Neg Hx     Medications: Patient's Medications  New Prescriptions   No medications on file  Previous Medications   ACETAMINOPHEN (TYLENOL) 160 MG/5ML SUSPENSION    Take by mouth every 6 (six) hours as needed.   ALBUTEROL (VENTOLIN HFA) 108 (90 BASE) MCG/ACT INHALER    Inhale 2 puffs into the lungs every 6 (six) hours as needed for wheezing or shortness of breath.   AMLODIPINE (NORVASC) 10 MG TABLET    Take 1 tablet (10 mg total) by mouth daily. for high blood pressure   BISOPROLOL (ZEBETA) 5 MG TABLET    TAKE 1/2 TABLET BY MOUTH EVERY DAY FOR HIGH BLOOD PRESSURE   CETIRIZINE (ZYRTEC) 10 MG TABLET    Take 1 tablet (10 mg total) by mouth daily.   DUPILUMAB (DUPIXENT) 200 MG/1.14ML PREFILLED SYRINGE    Inject 300 mg into the skin every 14 (fourteen) days.   DUPIXENT 300 MG/2ML PREFILLED SYRINGE    INJECT '300MG'$  SUBCUTANEOUSLY EVERY OTHER WEEK   EPINEPHRINE 0.3 MG/0.3 ML IJ SOAJ INJECTION    ADMINISTER 0.3 MG IN THE MUSCLE 1 TIME FOR 1 DOSE   EZETIMIBE (ZETIA) 10 MG TABLET    TAKE 1 TABLET(10 MG) BY MOUTH DAILY   FLUTICASONE (FLONASE) 50 MCG/ACT NASAL SPRAY    SHAKE LIQUID AND USE 2 SPRAYS IN EACH NOSTRIL DAILY   FLUTICASONE-SALMETEROL (WIXELA INHUB) 250-50 MCG/ACT AEPB    Inhale 1 puff into the lungs in the morning and at bedtime.   HYDRALAZINE (APRESOLINE) 25 MG TABLET    Take 1 tablet (25 mg total) by mouth every 8 (eight)  hours.   LATANOPROST (XALATAN) 0.005 % OPHTHALMIC SOLUTION    1 drop at bedtime.   OMEGA-3 FATTY ACIDS (FISH OIL) 500 MG CAPS    Take 1 capsule by mouth daily.   POTASSIUM CHLORIDE SA (KLOR-CON M) 20 MEQ TABLET    Take 1 tablet (20 mEq total) by mouth daily.   TRIAMCINOLONE OINTMENT (KENALOG) 0.5 %    APPLY TOPICALLY TO THE AFFECTED AREA(S) TWICE DAILY  Modified Medications   No  medications on file  Discontinued Medications   No medications on file    Physical Exam:  Vitals:   05/27/22 0841  BP: 122/70  Pulse: 71  Temp: (!) 97.1 F (36.2 C)  TempSrc: Temporal  SpO2: 96%  Weight: 68.5 kg  Height: '4\' 9"'$  (1.448 m)   Body mass index is 32.68 kg/m. Wt Readings from Last 3 Encounters:  05/27/22 68.5 kg  02/25/22 68 kg  10/16/21 68.9 kg    Physical Exam Exam conducted with a chaperone present.  Constitutional:      General: She is not in acute distress. HENT:     Head:     Comments: Poor dentition    Right Ear: Tympanic membrane, ear canal and external ear normal.     Left Ear: Tympanic membrane, ear canal and external ear normal.     Nose: Congestion and rhinorrhea present.     Mouth/Throat:     Mouth: Mucous membranes are moist.  Cardiovascular:     Rate and Rhythm: Normal rate and regular rhythm.     Pulses: Normal pulses.     Heart sounds: Normal heart sounds. No murmur heard. Pulmonary:     Effort: Pulmonary effort is normal. No respiratory distress.     Breath sounds: Normal breath sounds. No wheezing.  Chest:  Breasts:    Right: Normal.     Left: Normal.  Abdominal:     General: Bowel sounds are normal.     Palpations: Abdomen is soft.  Genitourinary:    Vagina: Normal.     Cervix: Normal.     Uterus: Normal.      Adnexa: Right adnexa normal and left adnexa normal.  Musculoskeletal:     Right lower leg: No edema.     Left lower leg: No edema.  Skin:    General: Skin is warm.     Capillary Refill: Capillary refill takes less than 2 seconds.      Findings: No rash.  Neurological:     Mental Status: She is alert and oriented to person, place, and time. Mental status is at baseline.  Psychiatric:        Mood and Affect: Mood normal.        Behavior: Behavior normal.     Labs reviewed: Basic Metabolic Panel: Recent Labs    08/27/21 1214 02/25/22 1121  NA 142 142  K 3.3* 3.7  CL 106 108  CO2 30 24  GLUCOSE 93 95  BUN 10 10  CREATININE 1.03 1.00  CALCIUM 9.4 9.5   Liver Function Tests: Recent Labs    08/27/21 1214 02/25/22 1121  AST 19 20  ALT 20 22  BILITOT 0.7 0.5  PROT 7.5 7.3   No results for input(s): "LIPASE", "AMYLASE" in the last 8760 hours. No results for input(s): "AMMONIA" in the last 8760 hours. CBC: Recent Labs    08/27/21 1214 02/25/22 1121  WBC 5.6 5.1  NEUTROABS 2,688 2,157  HGB 13.7 13.2  HCT 41.3 39.7  MCV 82.4 81.5  PLT 335 396   Lipid Panel: Recent Labs    08/27/21 1214 02/25/22 1121  CHOL 146 148  HDL 47* 42*  LDLCALC 75 79  TRIG 153* 175*  CHOLHDL 3.1 3.5   TSH: No results for input(s): "TSH" in the last 8760 hours. A1C: Lab Results  Component Value Date   HGBA1C 5.9 (H) 02/25/2022     Assessment/Plan 1. Diabetes mellitus due to underlying condition with hyperglycemia, without long-term current use  of insulin ([place]) - Controlled with lifestyle modifications - needs eye exam, pt to make appt.  - A1C at next visit -Encouraged dietary compliance, routine foot care/monitoring and to keep up with diabetic eye exams through ophthalmology   2. Need for influenza vaccination - Received in office today - Flu Vaccine QUAD High Dose(Fluad)  3. Mixed hyperlipidemia - continue low fat, low cholesterol diet - continue zetia - Lipid profile at next visit  4. Poor dentition - multiple mixing and decaying teeth - Recommend dentist appointment ASAP  5. Hypokalemia - continue potassium supplement  6. Essential hypertension - controlled - continue norvasc, bisoprolol,  and hydralazine  7. Non-seasonal allergic rhinitis, unspecified trigger - follows with allergist - continue dupixent, flonase, zyrtec  8. Moderate persistent asthma without complication - follows with allergist - continue Fluticasone- salmeterol, dupixet, albuterol  Return in about 4 months (around 09/27/2022) for routine follow up, labs at appt .  Student- Waunita Schooner, RN I personally was present during the history, physical exam and medical decision-making activities of this service and have verified that the service and findings are accurately documented in the student's note  Kiosha Buchan K. [place], [facility] [phone]

## 2022-05-28 ENCOUNTER — Other Ambulatory Visit: Payer: Self-pay | Admitting: Nurse Practitioner

## 2022-05-28 LAB — PAP IG (IMAGE GUIDED)

## 2022-05-28 MED ORDER — METRONIDAZOLE 500 MG PO TABS: 500.0000 mg | ORAL_TABLET | Freq: Two times a day (BID) | ORAL | 0 refills | Status: AC

## 2022-06-03 ENCOUNTER — Ambulatory Visit (INDEPENDENT_AMBULATORY_CARE_PROVIDER_SITE_OTHER): Payer: Medicare Other

## 2022-06-03 DIAGNOSIS — J4550 Severe persistent asthma, uncomplicated: Secondary | ICD-10-CM | POA: Diagnosis not present

## 2022-06-17 ENCOUNTER — Ambulatory Visit (INDEPENDENT_AMBULATORY_CARE_PROVIDER_SITE_OTHER): Payer: Medicare Other | Admitting: *Deleted

## 2022-06-17 DIAGNOSIS — J4550 Severe persistent asthma, uncomplicated: Secondary | ICD-10-CM

## 2022-07-01 ENCOUNTER — Other Ambulatory Visit: Payer: Self-pay | Admitting: Allergy & Immunology

## 2022-07-04 ENCOUNTER — Ambulatory Visit (INDEPENDENT_AMBULATORY_CARE_PROVIDER_SITE_OTHER): Payer: Medicare Other

## 2022-07-04 DIAGNOSIS — J4550 Severe persistent asthma, uncomplicated: Secondary | ICD-10-CM | POA: Diagnosis not present

## 2022-07-11 ENCOUNTER — Ambulatory Visit (INDEPENDENT_AMBULATORY_CARE_PROVIDER_SITE_OTHER): Payer: Medicare Other | Admitting: Allergy & Immunology

## 2022-07-11 ENCOUNTER — Encounter: Payer: Self-pay | Admitting: Allergy & Immunology

## 2022-07-11 ENCOUNTER — Other Ambulatory Visit: Payer: Self-pay

## 2022-07-11 VITALS — BP 118/72 | HR 69 | Temp 98.6°F | Resp 16 | Ht 60.0 in | Wt 150.0 lb

## 2022-07-11 DIAGNOSIS — J339 Nasal polyp, unspecified: Secondary | ICD-10-CM

## 2022-07-11 DIAGNOSIS — L309 Dermatitis, unspecified: Secondary | ICD-10-CM

## 2022-07-11 DIAGNOSIS — J984 Other disorders of lung: Secondary | ICD-10-CM | POA: Diagnosis not present

## 2022-07-11 DIAGNOSIS — J3089 Other allergic rhinitis: Secondary | ICD-10-CM

## 2022-07-11 DIAGNOSIS — J4550 Severe persistent asthma, uncomplicated: Secondary | ICD-10-CM | POA: Diagnosis not present

## 2022-07-11 MED ORDER — EPINEPHRINE 0.3 MG/0.3ML IJ SOAJ: INTRAMUSCULAR | 1 refills | Status: DC

## 2022-07-11 NOTE — Patient Instructions (Addendum)
1. Moderate persistent asthma, uncomplicated - Lung testing is back to normal.  - We are not going to make any medication changes.  - Daily controller medication(s): Wixela 250/50 mcg one puff twice daily + Dupixent every two weeks  - Prior to physical activity: albuterol 2 puffs 10-15 minutes before physical activity. - Rescue medications: albuterol 4 puffs every 4-6 hours as needed - Asthma control goals:  * Full participation in all desired activities (may need albuterol before activity) * Albuterol use two time or less a week on average (not counting use with activity) * Cough interfering with sleep two time or less a month * Oral steroids no more than once a year * No hospitalizations  2. Chronic rhinitis with nasal polyps - Continue with fluticasone two sprays per nostril twice daily. - Continue with cetirizine '10mg'$  daily.   3. Eczema versus contact dermatitis  - The Dupixent seems to have gotten your skin under better control.  - Your skin looks awesome today!  - MOISTURIZE MOISTURIZE MOISTURIZE!   4. Return in about 4 months (around 11/10/2022).    Please inform us of any Emergency Department visits, hospitalizations, or changes in symptoms. Call us before going to the ED for breathing or allergy symptoms since we might be able to fit you in for a sick visit. Feel free to contact us anytime with any questions, problems, or concerns.  It was a pleasure to see you again today!  Websites that have reliable patient information: 1. American Academy of Asthma, Allergy, and Immunology: www.aaaai.org 2. Food Allergy Research and Education (FARE): foodallergy.org 3. Mothers of Asthmatics: http://www.asthmacommunitynetwork.org 4. American College of Allergy, Asthma, and Immunology: www.acaai.org   COVID-19 Vaccine Information can be found at: ShippingScam.co.uk For questions related to vaccine distribution or appointments,  please email [email or web address] or call [phone].   We realize that you might be concerned about having an allergic reaction to the COVID19 vaccines. To help with that concern, WE ARE OFFERING THE COVID19 VACCINES IN OUR OFFICE! Ask the front desk for dates!     "Like" [place] on Facebook and Instagram for our latest updates!      A healthy democracy works best when [employer] participate! Make sure you are registered to vote! If you have moved or changed any of your contact information, you will need to get this updated before voting!  In some cases, you MAY be able to register to vote online: [email or web address]

## 2022-07-11 NOTE — Progress Notes (Signed)
FOLLOW UP  Date of Service/Encounter:  07/11/22   Assessment:   Severe persistent asthma with restrictive spirometry - improved spiro today (ordering full PFTs today)   Chronic rhinitis (grasses, molds)   Disorder of respiratory system exacerbated by aspirin   Nasal polyposis (L>R) - markedly improved on Dupixent   Eczema - well controlled since changing back to [place] against COVID-19, including booster  Plan/Recommendations:   1. Moderate persistent asthma, uncomplicated - Lung testing is back to normal.  - We are not going to make any medication changes.  - Daily controller medication(s): Wixela 250/50 mcg one puff twice daily + Dupixent every two weeks  - Prior to physical activity: albuterol 2 puffs 10-15 minutes before physical activity. - Rescue medications: albuterol 4 puffs every 4-6 hours as needed - Asthma control goals:  * Full participation in all desired activities (may need albuterol before activity) * Albuterol use two time or less a week on average (not counting use with activity) * Cough interfering with sleep two time or less a month * Oral steroids no more than once a year * No hospitalizations  2. Chronic rhinitis with nasal polyps - Continue with fluticasone two sprays per nostril twice daily. - Continue with cetirizine '10mg'$  daily.   3. Eczema versus contact dermatitis  - The Dupixent seems to have gotten your skin under better control.  - Your skin looks awesome today!  - MOISTURIZE MOISTURIZE MOISTURIZE!   4. Return in about 4 months (around 11/10/2022).    Subjective:   Tammie [name] is a 61 y.o. female presenting today for follow up of  Chief Complaint  Patient presents with   Asthma    Was doing okay but had a small asthma flare last week    Medication Refill    EpI    Tammie [name] has a history of the following: Patient Active Problem List   Diagnosis Date Noted   Type 2 diabetes mellitus with hyperglycemia  ([place]) 59/16/3846   Metabolic syndrome 65/99/3570   Chest pain at rest 11/29/2019   Mixed hyperlipidemia 11/10/2019   Hyperglycemia 11/10/2019   Dermatitis 06/22/2019   Chronic rhinitis 04/06/2019   Disorder of respiratory system exacerbated by aspirin 04/06/2019   Nasal polyposis 04/06/2019   Moderate persistent asthma without complication 17/79/3903   Generalized anxiety disorder 09/25/2016   Chronic seasonal allergic rhinitis 09/25/2016   Aortic valve regurgitation 09/25/2016   Other dysphagia 04/26/2014   Hypertension 04/18/2014    History obtained from: chart review and patient.  Tammie [name] is a 61 y.o. female presenting for a follow up visit. She was last seen in September 2023. At that time, her lung testing looked a bit worse. We continued with Wixela 283mg one puff BID and Dupixent every two weeks. She had run out of refills, which is why her lung testing was worse. For her rhinitis with nasal polyps, we  continued with the fluticasone two sprays per nostril BID as well as cetirizine.   Since the last visit, she has done well.   Asthma/Respiratory Symptom History: [facility].  She has a rescue inhaler that she needs fairly rarely. She is not coughing at night. She had a mini attack last week. It seems like she was having a lot of chest tightness. She thinks that this was the change of weather. She had some fumes in the house because they were doing some fixing of the HVAC system. This  might have set it all up.  It felt like it lasted around 1.5 hours. She is getting the [place]. It does hurt a bit.   Allergic Rhinitis Symptom History: Runny nose is not too bad. She barely has any problems with rhinorrhea. She does have mucous as well. She feels that this helps with the nasal polyps. She is using the Flonase regularly.   Skin Symptom History: Her skin is under much better control now. She is having some dry skin but it certainly looks better than it  has in the past. She did not moisturize today.   She has done remarkably well. The Dupixent has been a Higher education careers adviser for her.   Otherwise, there have been no changes to her past medical history, surgical history, family history, or social history.    Review of Systems  Constitutional: Negative.  Negative for chills, fever, malaise/fatigue and weight loss.  HENT: Negative.  Negative for congestion, ear discharge, ear pain and sinus pain.   Eyes:  Negative for pain, discharge and redness.  Respiratory:  Negative for cough, sputum production, shortness of breath and wheezing.   Cardiovascular: Negative.  Negative for chest pain and palpitations.  Gastrointestinal:  Negative for abdominal pain, constipation, diarrhea, heartburn, nausea and vomiting.  Skin: Negative.  Negative for itching and rash.  Neurological:  Negative for dizziness and headaches.  Endo/Heme/Allergies:  Negative for environmental allergies. Does not bruise/bleed easily.       Objective:   Blood pressure 118/72, pulse 69, temperature 98.6 F (37 C), resp. rate 16, height 5' (1.524 m), weight 150 lb (68 kg), SpO2 98 %. Body mass index is 29.29 kg/m.    Physical Exam Vitals reviewed.  Constitutional:      General: She is not in acute distress.    Appearance: She is well-developed. She is not ill-appearing.     Comments: Smiling and talkative.  Very appreciative.  HENT:     Head: Normocephalic and atraumatic.     Right Ear: Tympanic membrane, ear canal and external ear normal.     Left Ear: Tympanic membrane, ear canal and external ear normal.     Nose: Mucosal edema present. No nasal deformity, septal deviation or rhinorrhea.     Right Turbinates: Enlarged and swollen.     Left Turbinates: Enlarged and swollen.     Right Sinus: No maxillary sinus tenderness or frontal sinus tenderness.     Left Sinus: No maxillary sinus tenderness or frontal sinus tenderness.     Comments: Nasal polyps look much better today.      Mouth/Throat:     Mouth: Mucous membranes are not pale and not dry.     Pharynx: Uvula midline.     Tonsils: No tonsillar exudate or tonsillar abscesses. 1+ on the right. 1+ on the left.  Eyes:     General:        Right eye: No discharge.        Left eye: No discharge.     Conjunctiva/sclera: Conjunctivae normal.     Right eye: Right conjunctiva is not injected. No chemosis.    Left eye: Left conjunctiva is not injected. No chemosis.    Pupils: Pupils are equal, round, and reactive to light.  Cardiovascular:     Rate and Rhythm: Normal rate and regular rhythm.     Heart sounds: Normal heart sounds.  Pulmonary:     Effort: Pulmonary effort is normal. No tachypnea, accessory muscle usage or respiratory distress.  Breath sounds: Normal breath sounds. No wheezing, rhonchi or rales.     Comments: Moving air well in all lung fields.  No increased work of breathing. Chest:     Chest wall: No tenderness.  Lymphadenopathy:     Cervical: No cervical adenopathy.  Skin:    General: Skin is warm.     Capillary Refill: Capillary refill takes less than 2 seconds.     Coloration: Skin is not pale.     Findings: No abrasion, erythema, petechiae or rash. Rash is not papular, urticarial or vesicular.     Comments: Skin looks much better today.  There are few excoriations.  Neurological:     Mental Status: She is alert.  Psychiatric:        Behavior: Behavior is cooperative.      Diagnostic studies:    Spirometry: results abnormal (FEV1: 0.62/34%, FVC: 0.86/38%, FEV1/FVC: 72%).    Spirometry consistent with possible restrictive disease. This is slightly higher than those obtained at the last visit.    Allergy Studies: none        Salvatore Marvel, MD  [facility]

## 2022-07-19 ENCOUNTER — Ambulatory Visit (INDEPENDENT_AMBULATORY_CARE_PROVIDER_SITE_OTHER): Payer: Medicare Other

## 2022-07-19 DIAGNOSIS — J4550 Severe persistent asthma, uncomplicated: Secondary | ICD-10-CM

## 2022-08-02 ENCOUNTER — Ambulatory Visit (INDEPENDENT_AMBULATORY_CARE_PROVIDER_SITE_OTHER): Payer: Medicare Other

## 2022-08-02 DIAGNOSIS — J4550 Severe persistent asthma, uncomplicated: Secondary | ICD-10-CM | POA: Diagnosis not present

## 2022-08-16 ENCOUNTER — Ambulatory Visit (INDEPENDENT_AMBULATORY_CARE_PROVIDER_SITE_OTHER): Payer: 59

## 2022-08-16 DIAGNOSIS — J4550 Severe persistent asthma, uncomplicated: Secondary | ICD-10-CM | POA: Diagnosis not present

## 2022-08-30 ENCOUNTER — Ambulatory Visit (INDEPENDENT_AMBULATORY_CARE_PROVIDER_SITE_OTHER): Payer: 59

## 2022-08-30 DIAGNOSIS — J4550 Severe persistent asthma, uncomplicated: Secondary | ICD-10-CM | POA: Diagnosis not present

## 2022-09-13 ENCOUNTER — Ambulatory Visit (INDEPENDENT_AMBULATORY_CARE_PROVIDER_SITE_OTHER): Payer: 59 | Admitting: *Deleted

## 2022-09-13 DIAGNOSIS — J4550 Severe persistent asthma, uncomplicated: Secondary | ICD-10-CM | POA: Diagnosis not present

## 2022-09-23 ENCOUNTER — Other Ambulatory Visit: Payer: Self-pay | Admitting: Nurse Practitioner

## 2022-09-30 ENCOUNTER — Encounter: Payer: Self-pay | Admitting: Nurse Practitioner

## 2022-09-30 ENCOUNTER — Ambulatory Visit (INDEPENDENT_AMBULATORY_CARE_PROVIDER_SITE_OTHER): Payer: 59 | Admitting: Nurse Practitioner

## 2022-09-30 VITALS — BP 118/70 | HR 67 | Temp 97.8°F | Resp 17 | Ht 60.0 in | Wt 149.3 lb

## 2022-09-30 DIAGNOSIS — E0869 Diabetes mellitus due to underlying condition with other specified complication: Secondary | ICD-10-CM | POA: Diagnosis not present

## 2022-09-30 DIAGNOSIS — K089 Disorder of teeth and supporting structures, unspecified: Secondary | ICD-10-CM

## 2022-09-30 DIAGNOSIS — I10 Essential (primary) hypertension: Secondary | ICD-10-CM | POA: Diagnosis not present

## 2022-09-30 DIAGNOSIS — E782 Mixed hyperlipidemia: Secondary | ICD-10-CM

## 2022-09-30 DIAGNOSIS — J4550 Severe persistent asthma, uncomplicated: Secondary | ICD-10-CM | POA: Insufficient documentation

## 2022-09-30 DIAGNOSIS — J4540 Moderate persistent asthma, uncomplicated: Secondary | ICD-10-CM | POA: Diagnosis not present

## 2022-09-30 NOTE — Patient Instructions (Addendum)
Call and get eye doctor appt for diabetic eye exam.   [facility] in [place], [place] Address: [address], [place], [place] [address] Phone: [phone]

## 2022-09-30 NOTE — Progress Notes (Signed)
Careteam: Patient Care Team: [name], NP as PCP - General (Geriatric Medicine) [name], MD as Consulting Physician (Ophthalmology) Margaretha Seeds, MD as Consulting Physician (Pulmonary Disease) Valentina Shaggy, MD as Consulting Physician (Allergy and Immunology)  PLACE OF SERVICE:  [facility]  Advanced Directive information Does Patient Have a Medical Advance Directive?: No  Allergies  Allergen Reactions   Ace Inhibitors Other (See Comments)    Angioedema   Aspirin Anaphylaxis, Swelling and Other (See Comments)    Throat swelling    Doxepin Anaphylaxis   Ibuprofen Anaphylaxis, Swelling and Other (See Comments)    Throat swelling   Nsaids Anaphylaxis, Swelling and Other (See Comments)   Lipitor [Atorvastatin] Swelling and Other (See Comments)    Face, feet swell    Chief Complaint  Patient presents with   Medical Management of Chronic Issues    4 month follow up patient has no other concerns    Quality Metric Gaps    Discussed the need for Pap, A1c, eye examn      HPI: Patient is a 62 y.o. female for routine follow up.   Continues to follow up allergy and asthma for immunotherapy.   Reports mood has been good.    DM- not drinking a lot of soda, drinks more water Has cut back on deserts and sugar intake.   Review of Systems:  Review of Systems  Constitutional:  Negative for chills, fever and weight loss.  HENT:  Negative for tinnitus.   Respiratory:  Negative for cough, sputum production and shortness of breath.   Cardiovascular:  Negative for chest pain, palpitations and leg swelling.  Gastrointestinal:  Negative for abdominal pain, constipation, diarrhea and heartburn.  Genitourinary:  Negative for dysuria, frequency and urgency.  Musculoskeletal:  Negative for back pain, falls, joint pain and myalgias.  Skin: Negative.   Neurological:  Negative for dizziness and headaches.  Psychiatric/Behavioral:  Negative for depression  and memory loss. The patient does not have insomnia.     Past Medical History:  Diagnosis Date   Allergy    Anaphylaxis to ASA   Anemia    Anxiety    Eczema    Environmental allergies    GERD (gastroesophageal reflux disease)    Patient denies   Headache(784.0)    Hx of adenomatous colonic polyps    Hyperlipidemia    Hypertension    Moderate intermittent asthma    Pre-diabetes    Pre-diabetes    Respiratory failure, acute ([place]) 04/19/2014   Related to potential Anaphylaxis ? presumably to ASA - exacerbated by Moderate to Severe Asthma   Past Surgical History:  Procedure Laterality Date   COLONOSCOPY     NASAL SINUS SURGERY     Polyps   POLYPECTOMY     TRANSTHORACIC ECHOCARDIOGRAM  04/2014   in setting of cardiopulmonary arrest): EF 65 to 70%.  Mild concentric LVH.  GR 1 DD.  Mild AI.;;  August 2019: EF 55 to 60%.  Mild LVH.  GR 1 DD.  Mild AI/MR.  Mildly increased PA pressures.   Social History:   reports that she has never smoked. She has never used smokeless tobacco. She reports that she does not drink alcohol and does not use drugs.  Family History  Problem Relation Age of Onset   Heart attack Father    Heart attack Mother    Diabetes Sister    High blood pressure Brother    Hypertension Brother    Asthma  Brother    High blood pressure Sister    Cancer Sister    Lung cancer Sister    High blood pressure Son    Cancer Maternal Grandmother    Heart disease Paternal Grandmother    Pancreatic cancer Paternal Grandmother    Asthma Other        Great paternal grandfather    Autism Son    Asthma Other        Neice    Colon cancer Neg Hx    Esophageal cancer Neg Hx    Rectal cancer Neg Hx    Stomach cancer Neg Hx    Allergic rhinitis Neg Hx    Angioedema Neg Hx    Eczema Neg Hx    Immunodeficiency Neg Hx    Atopy Neg Hx    Colon polyps Neg Hx     Medications: Patient's Medications  New Prescriptions   No medications on file  Previous Medications    ACETAMINOPHEN (TYLENOL) 160 MG/5ML SUSPENSION    Take by mouth every 6 (six) hours as needed.   ALBUTEROL (VENTOLIN HFA) 108 (90 BASE) MCG/ACT INHALER    Inhale 2 puffs into the lungs every 6 (six) hours as needed for wheezing or shortness of breath.   AMLODIPINE (NORVASC) 10 MG TABLET    Take 1 tablet (10 mg total) by mouth daily. for high blood pressure   BISOPROLOL (ZEBETA) 5 MG TABLET    TAKE 1/2 TABLET BY MOUTH EVERY DAY FOR HIGH BLOOD PRESSURE   CETIRIZINE (ZYRTEC) 10 MG TABLET    Take 1 tablet (10 mg total) by mouth daily.   DUPILUMAB (DUPIXENT) 200 MG/1.14ML PREFILLED SYRINGE    Inject 300 mg into the skin every 14 (fourteen) days.   DUPIXENT 300 MG/2ML PREFILLED SYRINGE    INJECT '300MG'$  SUBCUTANEOUSLY  EVERY OTHER WEEK   EPINEPHRINE 0.3 MG/0.3 ML IJ SOAJ INJECTION    ADMINISTER 0.3 MG IN THE MUSCLE 1 TIME FOR 1 DOSE   EZETIMIBE (ZETIA) 10 MG TABLET    TAKE 1 TABLET(10 MG) BY MOUTH DAILY   FLUTICASONE (FLONASE) 50 MCG/ACT NASAL SPRAY    SHAKE LIQUID AND USE 2 SPRAYS IN EACH NOSTRIL DAILY   FLUTICASONE-SALMETEROL (WIXELA INHUB) 250-50 MCG/ACT AEPB    Inhale 1 puff into the lungs in the morning and at bedtime.   HYDRALAZINE (APRESOLINE) 25 MG TABLET    Take 1 tablet (25 mg total) by mouth every 8 (eight) hours.   LATANOPROST (XALATAN) 0.005 % OPHTHALMIC SOLUTION    1 drop at bedtime.   MONTELUKAST (SINGULAIR) 10 MG TABLET    Take 10 mg by mouth at bedtime.   OMEGA-3 FATTY ACIDS (FISH OIL) 500 MG CAPS    Take 1 capsule by mouth daily.   POTASSIUM CHLORIDE SA (KLOR-CON M) 20 MEQ TABLET    Take 1 tablet (20 mEq total) by mouth daily.   TRIAMCINOLONE OINTMENT (KENALOG) 0.5 %    APPLY TOPICALLY TO THE AFFECTED AREA(S) TWICE DAILY  Modified Medications   No medications on file  Discontinued Medications   No medications on file    Physical Exam:  Vitals:   09/30/22 1023  BP: 118/70  Pulse: 67  Resp: 17  Temp: 97.8 F (36.6 C)  TempSrc: Temporal  SpO2: 94%  Weight: 149 lb 4.8 oz (67.7  kg)  Height: 5' (1.524 m)   Body mass index is 29.16 kg/m. Wt Readings from Last 3 Encounters:  09/30/22 149 lb 4.8 oz (67.7 kg)  07/11/22 150 lb (68 kg)  05/27/22 151 lb (68.5 kg)    Physical Exam Constitutional:      General: She is not in acute distress.    Appearance: She is well-developed. She is not diaphoretic.  HENT:     Head: Normocephalic and atraumatic.     Mouth/Throat:     Dentition: Abnormal dentition. Dental caries present.     Pharynx: No oropharyngeal exudate.  Eyes:     Conjunctiva/sclera: Conjunctivae normal.     Pupils: Pupils are equal, round, and reactive to light.  Cardiovascular:     Rate and Rhythm: Normal rate and regular rhythm.     Heart sounds: Normal heart sounds.  Pulmonary:     Effort: Pulmonary effort is normal.     Breath sounds: Normal breath sounds.  Abdominal:     General: Bowel sounds are normal.     Palpations: Abdomen is soft.  Musculoskeletal:     Cervical back: Normal range of motion and neck supple.     Right lower leg: No edema.     Left lower leg: No edema.  Skin:    General: Skin is warm and dry.  Neurological:     Mental Status: She is alert.  Psychiatric:        Mood and Affect: Mood normal.     Labs reviewed: Basic Metabolic Panel: Recent Labs    02/25/22 1121  NA 142  K 3.7  CL 108  CO2 24  GLUCOSE 95  BUN 10  CREATININE 1.00  CALCIUM 9.5   Liver Function Tests: Recent Labs    02/25/22 1121  AST 20  ALT 22  BILITOT 0.5  PROT 7.3   No results for input(s): "LIPASE", "AMYLASE" in the last 8760 hours. No results for input(s): "AMMONIA" in the last 8760 hours. CBC: Recent Labs    02/25/22 1121  WBC 5.1  NEUTROABS 2,157  HGB 13.2  HCT 39.7  MCV 81.5  PLT 396   Lipid Panel: Recent Labs    02/25/22 1121  CHOL 148  HDL 42*  LDLCALC 79  TRIG 175*  CHOLHDL 3.5   TSH: No results for input(s): "TSH" in the last 8760 hours. A1C: Lab Results  Component Value Date   HGBA1C 5.9 (H)  02/25/2022     Assessment/Plan 1. Diabetes mellitus due to underlying condition with other specified complication, without long-term current use of insulin ([place]) -Encouraged dietary compliance, routine foot care/monitoring and to keep up with diabetic eye exams through ophthalmology  - Hemoglobin A1c - Microalbumin/Creatinine Ratio, Urine  2. Essential hypertension -Blood pressure well controlled, goal bp <140/90 Continue current medications and dietary modifications follow metabolic panel - CBC with Differential/Platelet - Complete Metabolic Panel with eGFR  3. Poor dentition -discussed risk of poor dentition with denial caries. Given number for A1 dentist as well as referral.  - Ambulatory referral to Dentistry  4. Moderate persistent asthma without complication -doing well with immunotherapy and wixela; symptoms well controlled at this time.  5. Mixed hyperlipidemia Stable on zetia and dietary modifications.    Return in about 6 months (around 03/31/2023) for routine follow up, labs at appt .  Carlos American. [place], [facility] [phone]

## 2022-10-01 LAB — CBC WITH DIFFERENTIAL/PLATELET
Absolute Monocytes: 678 {cells}/uL (ref 200–950)
Basophils Absolute: 77 {cells}/uL (ref 0–200)
Basophils Relative: 1 %
Eosinophils Absolute: 362 {cells}/uL (ref 15–500)
Eosinophils Relative: 4.7 %
HCT: 43.2 % (ref 35.0–45.0)
Hemoglobin: 14 g/dL (ref 11.7–15.5)
Lymphs Abs: 2680 {cells}/uL (ref 850–3900)
MCH: 26.5 pg — ABNORMAL LOW (ref 27.0–33.0)
MCHC: 32.4 g/dL (ref 32.0–36.0)
MCV: 81.7 fL (ref 80.0–100.0)
MPV: 9.7 fL (ref 7.5–12.5)
Monocytes Relative: 8.8 %
Neutro Abs: 3904 {cells}/uL (ref 1500–7800)
Neutrophils Relative %: 50.7 %
Platelets: 377 10*3/uL (ref 140–400)
RBC: 5.29 Million/uL — ABNORMAL HIGH (ref 3.80–5.10)
RDW: 13.2 % (ref 11.0–15.0)
Total Lymphocyte: 34.8 %
WBC: 7.7 10*3/uL (ref 3.8–10.8)

## 2022-10-01 LAB — COMPLETE METABOLIC PANEL WITHOUT GFR
AG Ratio: 1.2 (calc) (ref 1.0–2.5)
ALT: 19 U/L (ref 6–29)
AST: 16 U/L (ref 10–35)
Albumin: 4 g/dL (ref 3.6–5.1)
Alkaline phosphatase (APISO): 105 U/L (ref 37–153)
BUN: 10 mg/dL (ref 7–25)
CO2: 27 mmol/L (ref 20–32)
Calcium: 9.7 mg/dL (ref 8.6–10.4)
Chloride: 105 mmol/L (ref 98–110)
Creat: 1.02 mg/dL (ref 0.50–1.05)
Globulin: 3.4 g/dL (ref 1.9–3.7)
Glucose, Bld: 119 mg/dL — ABNORMAL HIGH (ref 65–99)
Potassium: 3.4 mmol/L — ABNORMAL LOW (ref 3.5–5.3)
Sodium: 140 mmol/L (ref 135–146)
Total Bilirubin: 0.6 mg/dL (ref 0.2–1.2)
Total Protein: 7.4 g/dL (ref 6.1–8.1)
eGFR: 62 mL/min/{1.73_m2}

## 2022-10-01 LAB — HEMOGLOBIN A1C
Hgb A1c MFr Bld: 6.1 %{Hb} — ABNORMAL HIGH
Mean Plasma Glucose: 128 mg/dL
eAG (mmol/L): 7.1 mmol/L

## 2022-10-01 LAB — MICROALBUMIN / CREATININE URINE RATIO
Creatinine, Urine: 56 mg/dL (ref 20–275)
Microalb Creat Ratio: 591 ug/mg{creat} — ABNORMAL HIGH
Microalb, Ur: 33.1 mg/dL

## 2022-10-04 ENCOUNTER — Ambulatory Visit (INDEPENDENT_AMBULATORY_CARE_PROVIDER_SITE_OTHER): Payer: 59 | Admitting: *Deleted

## 2022-10-04 DIAGNOSIS — J4550 Severe persistent asthma, uncomplicated: Secondary | ICD-10-CM | POA: Diagnosis not present

## 2022-10-18 ENCOUNTER — Ambulatory Visit: Payer: 59

## 2022-10-23 ENCOUNTER — Ambulatory Visit (INDEPENDENT_AMBULATORY_CARE_PROVIDER_SITE_OTHER): Payer: 59

## 2022-10-23 DIAGNOSIS — J4550 Severe persistent asthma, uncomplicated: Secondary | ICD-10-CM | POA: Diagnosis not present

## 2022-10-28 DIAGNOSIS — H0102A Squamous blepharitis right eye, upper and lower eyelids: Secondary | ICD-10-CM | POA: Diagnosis not present

## 2022-10-28 DIAGNOSIS — H0102B Squamous blepharitis left eye, upper and lower eyelids: Secondary | ICD-10-CM | POA: Diagnosis not present

## 2022-10-28 DIAGNOSIS — H5010 Unspecified exotropia: Secondary | ICD-10-CM | POA: Diagnosis not present

## 2022-10-28 DIAGNOSIS — H25813 Combined forms of age-related cataract, bilateral: Secondary | ICD-10-CM | POA: Diagnosis not present

## 2022-10-28 DIAGNOSIS — H40013 Open angle with borderline findings, low risk, bilateral: Secondary | ICD-10-CM | POA: Diagnosis not present

## 2022-11-06 ENCOUNTER — Ambulatory Visit (INDEPENDENT_AMBULATORY_CARE_PROVIDER_SITE_OTHER): Payer: 59

## 2022-11-06 DIAGNOSIS — J4550 Severe persistent asthma, uncomplicated: Secondary | ICD-10-CM | POA: Diagnosis not present

## 2022-11-14 ENCOUNTER — Ambulatory Visit (INDEPENDENT_AMBULATORY_CARE_PROVIDER_SITE_OTHER): Payer: 59 | Admitting: Allergy & Immunology

## 2022-11-14 ENCOUNTER — Encounter: Payer: Self-pay | Admitting: Allergy & Immunology

## 2022-11-14 ENCOUNTER — Other Ambulatory Visit: Payer: Self-pay

## 2022-11-14 VITALS — BP 140/76 | HR 92 | Temp 98.4°F | Resp 16 | Wt 148.0 lb

## 2022-11-14 DIAGNOSIS — J339 Nasal polyp, unspecified: Secondary | ICD-10-CM | POA: Diagnosis not present

## 2022-11-14 DIAGNOSIS — L309 Dermatitis, unspecified: Secondary | ICD-10-CM

## 2022-11-14 DIAGNOSIS — J3089 Other allergic rhinitis: Secondary | ICD-10-CM

## 2022-11-14 DIAGNOSIS — J4550 Severe persistent asthma, uncomplicated: Secondary | ICD-10-CM | POA: Diagnosis not present

## 2022-11-14 DIAGNOSIS — J302 Other seasonal allergic rhinitis: Secondary | ICD-10-CM

## 2022-11-14 MED ORDER — FLUTICASONE-SALMETEROL 250-50 MCG/ACT IN AEPB: 1.0000 | INHALATION_SPRAY | Freq: Two times a day (BID) | RESPIRATORY_TRACT | 1 refills | Status: DC

## 2022-11-14 MED ORDER — MONTELUKAST SODIUM 10 MG PO TABS: 10.0000 mg | ORAL_TABLET | Freq: Every day | ORAL | 1 refills | Status: AC

## 2022-11-14 MED ORDER — TRIAMCINOLONE ACETONIDE 0.5 % EX OINT: TOPICAL_OINTMENT | CUTANEOUS | 5 refills | Status: DC

## 2022-11-14 MED ORDER — FLUTICASONE PROPIONATE 50 MCG/ACT NA SUSP: 2.0000 | Freq: Every day | NASAL | 1 refills | Status: AC

## 2022-11-14 MED ORDER — CETIRIZINE HCL 10 MG PO TABS: 10.0000 mg | ORAL_TABLET | Freq: Every day | ORAL | 1 refills | Status: DC

## 2022-11-14 NOTE — Patient Instructions (Addendum)
1. Moderate persistent asthma, uncomplicated - Lung testing looks MUCH better today for you especially!  - I think we are in a good place.  - We are not going to make any medication changes.  - Daily controller medication(s): Wixela 250/50 mcg one puff twice daily + Dupixent every two weeks  - Prior to physical activity: albuterol 2 puffs 10-15 minutes before physical activity. - Rescue medications: albuterol 4 puffs every 4-6 hours as needed - Asthma control goals:  * Full participation in all desired activities (may need albuterol before activity) * Albuterol use two time or less a week on average (not counting use with activity) * Cough interfering with sleep two time or less a month * Oral steroids no more than once a year * No hospitalizations  2. Chronic rhinitis with nasal polyps - Continue with fluticasone two sprays per nostril twice daily. - Continue with cetirizine 10mg  daily.   3. Eczema versus contact dermatitis  - The Dupixent seems to have gotten your skin under better control.  - Your skin looks awesome today!  - MOISTURIZE MOISTURIZE MOISTURIZE!   4. Return in about 4 months (around 03/16/2023). You look amazing!    Please inform us of any Emergency Department visits, hospitalizations, or changes in symptoms. Call us before going to the ED for breathing or allergy symptoms since we might be able to fit you in for a sick visit. Feel free to contact us anytime with any questions, problems, or concerns.  It was a pleasure to see you again today!  Websites that have reliable patient information: 1. American Academy of Asthma, Allergy, and Immunology: www.aaaai.org 2. Food Allergy Research and Education (FARE): foodallergy.org 3. Mothers of Asthmatics: http://www.asthmacommunitynetwork.org 4. American College of Allergy, Asthma, and Immunology: www.acaai.org   COVID-19 Vaccine Information can be found at:  [email or web address] For questions related to vaccine distribution or appointments, please email [email or web address]@ [email or web address] or call [phone].   We realize that you might be concerned about having an allergic reaction to the COVID19 vaccines. To help with that concern, WE ARE OFFERING THE COVID19 VACCINES IN OUR OFFICE! Ask the front desk for dates!     "Like" [place] on Facebook and Instagram for our latest updates!      A healthy democracy works best when [employer] participate! Make sure you are registered to vote! If you have moved or changed any of your contact information, you will need to get this updated before voting!  In some cases, you MAY be able to register to vote online: [email or web address]

## 2022-11-14 NOTE — Progress Notes (Signed)
FOLLOW UP  Date of Service/Encounter:  11/14/22   Assessment:   Severe persistent asthma with restrictive spirometry - improved spiro today   Chronic rhinitis (grasses, molds)   Disorder of respiratory system exacerbated by aspirin   Nasal polyposis (L>R) - markedly improved on Dupixent   Eczema - well controlled since changing back to Dupixent   Vaccinated against COVID-19, including booster  Plan/Recommendations:   1. Moderate persistent asthma, uncomplicated - Lung testing looks MUCH better today for you especially!  - I think we are in a good place.  - We are not going to make any medication changes.  - Daily controller medication(s): Wixela 250/50 mcg one puff twice daily + Dupixent every two weeks  - Prior to physical activity: albuterol 2 puffs 10-15 minutes before physical activity. - Rescue medications: albuterol 4 puffs every 4-6 hours as needed - Asthma control goals:  * Full participation in all desired activities (may need albuterol before activity) * Albuterol use two time or less a week on average (not counting use with activity) * Cough interfering with sleep two time or less a month * Oral steroids no more than once a year * No hospitalizations  2. Chronic rhinitis with nasal polyps - Continue with fluticasone two sprays per nostril twice daily. - Continue with cetirizine 10mg  daily.   3. Eczema versus contact dermatitis  - The Dupixent seems to have gotten your skin under better control.  - Your skin looks awesome today!  - MOISTURIZE MOISTURIZE MOISTURIZE!   4. Return in about 4 months (around 03/16/2023). You look amazing!    Subjective:   Tammie [name] is a 62 y.o. female presenting today for follow up of  Chief Complaint  Patient presents with   Asthma   Follow-up    Tammie [name] has a history of the following: Patient Active Problem List   Diagnosis Date Noted   Severe persistent asthma without complication 09/30/2022   Type 2  diabetes mellitus with hyperglycemia 05/11/2020   Metabolic syndrome 11/29/2019   Chest pain at rest 11/29/2019   Mixed hyperlipidemia 11/10/2019   Hyperglycemia 11/10/2019   Dermatitis 06/22/2019   Chronic rhinitis 04/06/2019   Disorder of respiratory system exacerbated by aspirin 04/06/2019   Nasal polyposis 04/06/2019   Moderate persistent asthma without complication 09/25/2016   Generalized anxiety disorder 09/25/2016   Chronic seasonal allergic rhinitis 09/25/2016   Aortic valve regurgitation 09/25/2016   Chronic pansinusitis 11/22/2015   Other dysphagia 04/26/2014   Asthma 04/26/2014   Hypertension 04/18/2014    History obtained from: chart review and patient.  [name] is a 62 y.o. female presenting for a follow up visit. We last saw her  in December 2023. At that time, her lung testing was back to normal. We continued her on Wixela 250/50 mcg one puff twice daily and Dupixent every two weeks. For her rhinitis and nasal polyps, we continued with fluticasone and Dupixent. She was doing very well with this regimen.   Since the last visit, she has done well.   Asthma/Respiratory Symptom History: She feels that her breathing is under good control. She does have some issues when the trees start blooming. She remains on her Wixela one puff twice daily. She has been complaint with her Dupixent.  She is also on Singulair daily. She has not been using her albuterol frequently. She does needs some refilsl since hers expired.  She has not needed prednisone.  She has not been to the emergency room.  Overall,  symptoms are under good control.  She does feel great since she has been on Dupixent.  Allergic Rhinitis Symptom History: Her environmental allergies are under good control with the cetirizine.  She also uses the Flonase every day, although she did not remember to do it this morning before she came in.  She has not been on antibiotics for any sinus infection.  She has not been on prednisone  for nasal polyposis flares.  Skin Symptom History: Her skin is under great control with the Dupixent.  She does moisturize every day.  She has an area on her right elbow that is a little more lichenified.  She has been applying extra moisturizer to this.  Otherwise, her skin is completely clear.  She is currently on a new diabetes medication.  Her A1c is trending around 6.  They are trying to get it down to 5 per the patient.  She does like sweets including regular sodas, which seem to be her downfall.  She is on disability.  She has not gone to the medication in quite some time, but she is thinking about going to the beach this summer.  She has a son who has autism.  He spends time at her house whenever his normal sitter is on vacation.  She does get to see him pretty frequently.  Otherwise, there have been no changes to her past medical history, surgical history, family history, or social history.    Review of Systems  Constitutional: Negative.  Negative for chills, fever, malaise/fatigue and weight loss.  HENT: Negative.  Negative for congestion, ear discharge, ear pain and sinus pain.   Eyes:  Negative for pain, discharge and redness.  Respiratory:  Negative for cough, sputum production, shortness of breath and wheezing.   Cardiovascular: Negative.  Negative for chest pain and palpitations.  Gastrointestinal:  Negative for abdominal pain, constipation, diarrhea, heartburn, nausea and vomiting.  Skin: Negative.  Negative for itching and rash.  Neurological:  Negative for dizziness and headaches.  Endo/Heme/Allergies:  Negative for environmental allergies. Does not bruise/bleed easily.       Objective:   Blood pressure (!) 140/76, pulse 92, temperature 98.4 F (36.9 C), temperature source Temporal, resp. rate 16, weight 148 lb (67.1 kg), SpO2 94 %. Body mass index is 28.9 kg/m.    Physical Exam Vitals reviewed.  Constitutional:      General: She is not in acute distress.     Appearance: She is well-developed. She is not ill-appearing.     Comments: Smiling and talkative.  Very appreciative. Lovely.   HENT:     Head: Normocephalic and atraumatic.     Right Ear: Tympanic membrane, ear canal and external ear normal.     Left Ear: Tympanic membrane, ear canal and external ear normal.     Nose: Mucosal edema present. No nasal deformity, septal deviation or rhinorrhea.     Right Turbinates: Enlarged, swollen and pale.     Left Turbinates: Enlarged, swollen and pale.     Right Sinus: No maxillary sinus tenderness or frontal sinus tenderness.     Left Sinus: No maxillary sinus tenderness or frontal sinus tenderness.     Comments: Nasal polyps look much better today.  There are only obstructing around 10 to 15% of her nasal cavity bilaterally.    Mouth/Throat:     Mouth: Mucous membranes are not pale and not dry.     Pharynx: Uvula midline.     Tonsils: No tonsillar exudate or tonsillar  abscesses. 1+ on the right. 1+ on the left.  Eyes:     General: Allergic shiner present.        Right eye: No discharge.        Left eye: No discharge.     Conjunctiva/sclera: Conjunctivae normal.     Right eye: Right conjunctiva is not injected. No chemosis.    Left eye: Left conjunctiva is not injected. No chemosis.    Pupils: Pupils are equal, round, and reactive to light.  Cardiovascular:     Rate and Rhythm: Normal rate and regular rhythm.     Heart sounds: Normal heart sounds.  Pulmonary:     Effort: Pulmonary effort is normal. No tachypnea, accessory muscle usage or respiratory distress.     Breath sounds: Normal breath sounds. No wheezing, rhonchi or rales.     Comments: Moving air well in all lung fields.  No increased work of breathing. Chest:     Chest wall: No tenderness.  Lymphadenopathy:     Cervical: No cervical adenopathy.  Skin:    General: Skin is warm.     Capillary Refill: Capillary refill takes less than 2 seconds.     Coloration: Skin is not pale.      Findings: No abrasion, erythema, petechiae or rash. Rash is not papular, urticarial or vesicular.     Comments: Skin looks much better today.  There are few excoriations.  Neurological:     Mental Status: She is alert.  Psychiatric:        Behavior: Behavior is cooperative.      Diagnostic studies:    Spirometry: results abnormal (FEV1: 0.93/58%, FVC: 1.20/60%, FEV1/FVC: 78%).    Spirometry consistent with possible restrictive disease. However this is a personal best for her.    Allergy Studies: none        Malachi Bonds, MD  [facility]

## 2022-11-20 ENCOUNTER — Ambulatory Visit (INDEPENDENT_AMBULATORY_CARE_PROVIDER_SITE_OTHER): Payer: 59

## 2022-11-20 DIAGNOSIS — J4550 Severe persistent asthma, uncomplicated: Secondary | ICD-10-CM | POA: Diagnosis not present

## 2022-11-25 ENCOUNTER — Other Ambulatory Visit: Payer: Self-pay | Admitting: Nurse Practitioner

## 2022-11-25 DIAGNOSIS — I10 Essential (primary) hypertension: Secondary | ICD-10-CM

## 2022-12-04 ENCOUNTER — Ambulatory Visit (INDEPENDENT_AMBULATORY_CARE_PROVIDER_SITE_OTHER): Payer: 59 | Admitting: *Deleted

## 2022-12-04 DIAGNOSIS — J4550 Severe persistent asthma, uncomplicated: Secondary | ICD-10-CM

## 2022-12-12 ENCOUNTER — Other Ambulatory Visit: Payer: Self-pay | Admitting: Nurse Practitioner

## 2022-12-12 DIAGNOSIS — I10 Essential (primary) hypertension: Secondary | ICD-10-CM

## 2022-12-18 ENCOUNTER — Ambulatory Visit (INDEPENDENT_AMBULATORY_CARE_PROVIDER_SITE_OTHER): Payer: 59 | Admitting: *Deleted

## 2022-12-18 DIAGNOSIS — J4550 Severe persistent asthma, uncomplicated: Secondary | ICD-10-CM

## 2023-01-03 ENCOUNTER — Other Ambulatory Visit: Payer: Self-pay | Admitting: Allergy & Immunology

## 2023-01-08 ENCOUNTER — Ambulatory Visit (INDEPENDENT_AMBULATORY_CARE_PROVIDER_SITE_OTHER): Payer: 59 | Admitting: *Deleted

## 2023-01-08 DIAGNOSIS — J4550 Severe persistent asthma, uncomplicated: Secondary | ICD-10-CM | POA: Diagnosis not present

## 2023-01-23 ENCOUNTER — Ambulatory Visit (INDEPENDENT_AMBULATORY_CARE_PROVIDER_SITE_OTHER): Payer: 59

## 2023-01-23 DIAGNOSIS — J4550 Severe persistent asthma, uncomplicated: Secondary | ICD-10-CM

## 2023-02-03 ENCOUNTER — Ambulatory Visit (INDEPENDENT_AMBULATORY_CARE_PROVIDER_SITE_OTHER): Payer: 59

## 2023-02-03 DIAGNOSIS — J4550 Severe persistent asthma, uncomplicated: Secondary | ICD-10-CM | POA: Diagnosis not present

## 2023-02-17 ENCOUNTER — Ambulatory Visit (INDEPENDENT_AMBULATORY_CARE_PROVIDER_SITE_OTHER): Payer: 59 | Admitting: *Deleted

## 2023-02-17 DIAGNOSIS — J4550 Severe persistent asthma, uncomplicated: Secondary | ICD-10-CM

## 2023-03-04 ENCOUNTER — Ambulatory Visit: Payer: 59

## 2023-03-04 DIAGNOSIS — J4550 Severe persistent asthma, uncomplicated: Secondary | ICD-10-CM

## 2023-03-12 ENCOUNTER — Other Ambulatory Visit: Payer: Self-pay | Admitting: Nurse Practitioner

## 2023-03-12 DIAGNOSIS — I10 Essential (primary) hypertension: Secondary | ICD-10-CM

## 2023-03-18 ENCOUNTER — Ambulatory Visit (INDEPENDENT_AMBULATORY_CARE_PROVIDER_SITE_OTHER): Payer: 59 | Admitting: *Deleted

## 2023-03-18 DIAGNOSIS — J4550 Severe persistent asthma, uncomplicated: Secondary | ICD-10-CM

## 2023-03-28 ENCOUNTER — Encounter: Payer: Self-pay | Admitting: Nurse Practitioner

## 2023-03-28 ENCOUNTER — Ambulatory Visit (INDEPENDENT_AMBULATORY_CARE_PROVIDER_SITE_OTHER): Payer: 59 | Admitting: Nurse Practitioner

## 2023-03-28 VITALS — BP 128/72 | HR 68 | Temp 97.8°F | Resp 16 | Ht 60.0 in | Wt 144.0 lb

## 2023-03-28 DIAGNOSIS — E782 Mixed hyperlipidemia: Secondary | ICD-10-CM

## 2023-03-28 DIAGNOSIS — I10 Essential (primary) hypertension: Secondary | ICD-10-CM | POA: Diagnosis not present

## 2023-03-28 DIAGNOSIS — E0869 Diabetes mellitus due to underlying condition with other specified complication: Secondary | ICD-10-CM

## 2023-03-28 DIAGNOSIS — J4540 Moderate persistent asthma, uncomplicated: Secondary | ICD-10-CM

## 2023-03-28 DIAGNOSIS — R4589 Other symptoms and signs involving emotional state: Secondary | ICD-10-CM

## 2023-03-28 MED ORDER — LORAZEPAM 0.5 MG PO TABS
ORAL_TABLET | ORAL | 0 refills | Status: DC
Start: 2023-03-28 — End: 2023-09-29

## 2023-03-28 NOTE — Progress Notes (Signed)
Careteam: Patient Care Team: Sharon Seller, NP as PCP - General (Geriatric Medicine) Sallye Lat, MD as Consulting Physician (Ophthalmology) Luciano Cutter, MD as Consulting Physician (Pulmonary Disease) Alfonse Spruce, MD as Consulting Physician (Allergy and Immunology)  PLACE OF SERVICE:  [facility]  Advanced Directive information Does Patient Have a Medical Advance Directive?: No, Would patient like information on creating a medical advance directive?: No - Patient declined  Allergies  Allergen Reactions   Ace Inhibitors Other (See Comments)    Angioedema   Aspirin Anaphylaxis, Swelling and Other (See Comments)    Throat swelling    Doxepin Anaphylaxis   Ibuprofen Anaphylaxis, Swelling and Other (See Comments)    Throat swelling   Nsaids Anaphylaxis, Swelling and Other (See Comments)   Lipitor [Atorvastatin] Swelling and Other (See Comments)    Face, feet swell    Chief Complaint  Patient presents with   Medication Management    6 month follow up. Patient states she is a little sore around the neck   Health Maintenance    Patient is due for foot exam    Immunizations    Discuss need for flu and covid vaccine      HPI: Patient is a 62 y.o. female here for a routine follow up.   Severe asthma: [facility] up [facility], receiving immunotherapy; states her breathing is fine, has not had a flare up in a while   DM: controlled with diet; not checking blood sugars at home  Losing weight without trying. States she's having a hard time eating because of poor dentition. States she has an appointment 8/29 with a new dentist (has not had a dentist since the 90s).  Review of Systems:  Review of Systems  Constitutional:  Positive for weight loss (Due to mouth pain).  Respiratory:  Negative for cough and shortness of breath.   Cardiovascular:  Negative for chest pain.  Gastrointestinal:  Negative for blood in stool, constipation  and diarrhea.  Genitourinary:  Negative for dysuria and frequency.  Musculoskeletal:  Positive for neck pain (Some limitation with movement).  Neurological:  Negative for dizziness, tingling and sensory change.  Psychiatric/Behavioral:  The patient is nervous/anxious (From anticipation due to upcoming dentist appointment). The patient does not have insomnia (Sleeps at least 6-7 hours/night).     Past Medical History:  Diagnosis Date   Allergy    Anaphylaxis to ASA   Anemia    Anxiety    Eczema    Environmental allergies    GERD (gastroesophageal reflux disease)    Patient denies   Headache(784.0)    Hx of adenomatous colonic polyps    Hyperlipidemia    Hypertension    Moderate intermittent asthma    Pre-diabetes    Pre-diabetes    Respiratory failure, acute (HCC) 04/19/2014   Related to potential Anaphylaxis ? presumably to ASA - exacerbated by Moderate to Severe Asthma   Past Surgical History:  Procedure Laterality Date   COLONOSCOPY     NASAL SINUS SURGERY     Polyps   POLYPECTOMY     TRANSTHORACIC ECHOCARDIOGRAM  04/2014   in setting of cardiopulmonary arrest): EF 65 to 70%.  Mild concentric LVH.  GR 1 DD.  Mild AI.;;  August 2019: EF 55 to 60%.  Mild LVH.  GR 1 DD.  Mild AI/MR.  Mildly increased PA pressures.   Social History:   reports that she has never smoked. She has never used  smokeless tobacco. She reports that she does not drink alcohol and does not use drugs.  Family History  Problem Relation Age of Onset   Heart attack Father    Heart attack Mother    Diabetes Sister    High blood pressure Brother    Hypertension Brother    Asthma Brother    High blood pressure Sister    Cancer Sister    Lung cancer Sister    High blood pressure Son    Cancer Maternal Grandmother    Heart disease Paternal Grandmother    Pancreatic cancer Paternal Grandmother    Asthma Other        Great paternal grandfather    Autism Son    Asthma Other        Neice    Colon  cancer Neg Hx    Esophageal cancer Neg Hx    Rectal cancer Neg Hx    Stomach cancer Neg Hx    Allergic rhinitis Neg Hx    Angioedema Neg Hx    Eczema Neg Hx    Immunodeficiency Neg Hx    Atopy Neg Hx    Colon polyps Neg Hx     Medications: Patient's Medications  New Prescriptions   LORAZEPAM (ATIVAN) 0.5 MG TABLET    1 tablet before dental visit  Previous Medications   ACETAMINOPHEN (TYLENOL) 160 MG/5ML SUSPENSION    Take by mouth every 6 (six) hours as needed.   ALBUTEROL (VENTOLIN HFA) 108 (90 BASE) MCG/ACT INHALER    INHALE 2 PUFFS INTO THE LUNGS EVERY 6 HOURS AS NEEDED FOR WHEEZING OR SHORTNESS OF BREATH   AMLODIPINE (NORVASC) 10 MG TABLET    TAKE 1 TABLET(10 MG) BY MOUTH DAILY FOR HIGH BLOOD PRESSURE   BISOPROLOL (ZEBETA) 5 MG TABLET    TAKE 1/2 TABLET BY MOUTH EVERY DAY FOR HIGH BLOOD PRESSURE   CETIRIZINE (ZYRTEC) 10 MG TABLET    Take 1 tablet (10 mg total) by mouth daily.   DUPILUMAB (DUPIXENT) 200 MG/1. PREFILLED SYRINGE    Inject 300 mg into the skin every 14 (fourteen) days.   DUPIXENT 300 MG/2ML PREFILLED SYRINGE    INJECT 300MG  SUBCUTANEOUSLY  EVERY OTHER WEEK   EZETIMIBE (ZETIA) 10 MG TABLET    TAKE 1 TABLET(10 MG) BY MOUTH DAILY   FLUTICASONE (FLONASE) 50 MCG/ACT NASAL SPRAY    Place 2 sprays into both nostrils daily. SHAKE LIQUID AND USE 2 SPRAYS IN EACH NOSTRIL DAILY   FLUTICASONE-SALMETEROL (WIXELA INHUB) 250-50 MCG/ACT AEPB    Inhale 1 puff into the lungs in the morning and at bedtime.   HYDRALAZINE (APRESOLINE) 25 MG TABLET    TAKE 1 TABLET BY MOUTH EVERY 8 HOURS   LATANOPROST (XALATAN) 0.005 % OPHTHALMIC SOLUTION    1 drop at bedtime.   MONTELUKAST (SINGULAIR) 10 MG TABLET    Take 1 tablet (10 mg total) by mouth at bedtime.   OMEGA-3 FATTY ACIDS (FISH OIL) 500 MG CAPS    Take 1 capsule by mouth daily.   POTASSIUM CHLORIDE SA (KLOR-CON M) 20 MEQ TABLET    Take 1 tablet (20 mEq total) by mouth daily.   TRIAMCINOLONE OINTMENT (KENALOG) 0.5 %    APPLY TOPICALLY TO  THE AFFECTED AREA(S) TWICE DAILY  Modified Medications   No medications on file  Discontinued Medications   No medications on file    Physical Exam:  Vitals:   03/28/23 0855  BP: 128/72  Pulse: 68  Resp: 16  Temp: 97.8  F (36.6 C)  TempSrc: Temporal  SpO2: 96%  Weight: 144 lb (65.3 kg)  Height: 5' (1.524 m)   Body mass index is 28.12 kg/m. Wt Readings from Last 3 Encounters:  03/28/23 144 lb (65.3 kg)  11/14/22 148 lb (67.1 kg)  09/30/22 149 lb 4.8 oz (67.7 kg)    Physical Exam Constitutional:      General: She is not in acute distress.    Appearance: Normal appearance.  HENT:     Mouth/Throat:     Mouth: Mucous membranes are moist.     Comments: Poor dental hygiene Cardiovascular:     Rate and Rhythm: Normal rate and regular rhythm.     Pulses: Normal pulses.     Heart sounds: Normal heart sounds.  Pulmonary:     Effort: Pulmonary effort is normal.     Breath sounds: Normal breath sounds.  Abdominal:     General: Bowel sounds are normal.     Palpations: Abdomen is soft.  Musculoskeletal:        General: Normal range of motion.  Skin:    General: Skin is warm and dry.  Neurological:     General: No focal deficit present.     Mental Status: She is alert and oriented to person, place, and time. Mental status is at baseline.     Sensory: No sensory deficit.  Psychiatric:        Mood and Affect: Mood normal.        Behavior: Behavior normal.     Labs reviewed: Basic Metabolic Panel: Recent Labs    09/30/22 0000  NA 140  K 3.4*  CL 105  CO2 27  GLUCOSE 119*  BUN 10  CREATININE 1.02  CALCIUM 9.7   Liver Function Tests: Recent Labs    09/30/22 0000  AST 16  ALT 19  BILITOT 0.6  PROT 7.4   No results for input(s): "LIPASE", "AMYLASE" in the last 8760 hours. No results for input(s): "AMMONIA" in the last 8760 hours. CBC: Recent Labs    09/30/22 0000  WBC 7.7  NEUTROABS 3,904  HGB 14.0  HCT 43.2  MCV 81.7  PLT 377   Lipid  Panel: No results for input(s): "CHOL", "HDL", "LDLCALC", "TRIG", "CHOLHDL", "LDLDIRECT" in the last 8760 hours. TSH: No results for input(s): "TSH" in the last 8760 hours. A1C: Lab Results  Component Value Date   HGBA1C 6.1 (H) 09/30/2022     Assessment/Plan 1. Diabetes mellitus due to underlying condition with other specified complication, without long-term current use of insulin (HCC) -Dietary compliance encouraged, seeing ophthalmology for diabetic eye exams -Hemoglobin A1C -CMP   2. Essential hypertension -Well controlled at goal <140/90 -Continue current medications  3. Moderate persistent asthma without complication -On immunotherapy; doing well  4. Mixed hyperlipidemia -Dietary modifications encouraged  5. Poor dentition/Anxiety about visiting dentist -Has appointment 8/29 with dentist -lorazepam for anxiety related to dentist visit - LORazepam (ATIVAN) 0.5 MG tablet; 1 tablet before dental visit  Dispense: 5 tablet; Refill: 0   Rollen Sox, FNP-MSN Student  I personally was present during the history, physical exam and medical decision-making activities of this service and have verified that the service and findings are accurately documented in the student's note  Sherald Balbuena K. Biagio Borg [facility] [phone]

## 2023-03-29 LAB — CBC WITH DIFFERENTIAL/PLATELET
Absolute Monocytes: 518 {cells}/uL (ref 200–950)
Basophils Absolute: 49 {cells}/uL (ref 0–200)
Basophils Relative: 0.9 %
Eosinophils Absolute: 221 {cells}/uL (ref 15–500)
Eosinophils Relative: 4.1 %
HCT: 41.1 % (ref 35.0–45.0)
Hemoglobin: 13.4 g/dL (ref 11.7–15.5)
Lymphs Abs: 2268 {cells}/uL (ref 850–3900)
MCH: 27.1 pg (ref 27.0–33.0)
MCHC: 32.6 g/dL (ref 32.0–36.0)
MCV: 83 fL (ref 80.0–100.0)
MPV: 9.7 fL (ref 7.5–12.5)
Monocytes Relative: 9.6 %
Neutro Abs: 2344 {cells}/uL (ref 1500–7800)
Neutrophils Relative %: 43.4 %
Platelets: 344 10*3/uL (ref 140–400)
RBC: 4.95 10*6/uL (ref 3.80–5.10)
RDW: 12.7 % (ref 11.0–15.0)
Total Lymphocyte: 42 %
WBC: 5.4 10*3/uL (ref 3.8–10.8)

## 2023-03-29 LAB — LIPID PANEL
Cholesterol: 133 mg/dL (ref <200)
HDL: 42 mg/dL — ABNORMAL LOW (ref >=50)
LDL Cholesterol (Calc): 67 mg/dL
Non-HDL Cholesterol (Calc): 91 mg/dL (ref <130)
Total CHOL/HDL Ratio: 3.2 (calc) (ref <5.0)
Triglycerides: 166 mg/dL — ABNORMAL HIGH (ref <150)

## 2023-03-29 LAB — HEMOGLOBIN A1C
Hgb A1c MFr Bld: 6 %{Hb} — ABNORMAL HIGH (ref <5.7)
Mean Plasma Glucose: 126 mg/dL
eAG (mmol/L): 7 mmol/L

## 2023-03-29 LAB — COMPLETE METABOLIC PANEL WITH GFR
AG Ratio: 1.2 (calc) (ref 1.0–2.5)
ALT: 19 U/L (ref 6–29)
AST: 19 U/L (ref 10–35)
Albumin: 3.8 g/dL (ref 3.6–5.1)
Alkaline phosphatase (APISO): 93 U/L (ref 37–153)
BUN: 10 mg/dL (ref 7–25)
CO2: 30 mmol/L (ref 20–32)
Calcium: 10.1 mg/dL (ref 8.6–10.4)
Chloride: 107 mmol/L (ref 98–110)
Creat: 0.95 mg/dL (ref 0.50–1.05)
Globulin: 3.1 g/dL (ref 1.9–3.7)
Glucose, Bld: 93 mg/dL (ref 65–99)
Potassium: 4.3 mmol/L (ref 3.5–5.3)
Sodium: 143 mmol/L (ref 135–146)
Total Bilirubin: 0.6 mg/dL (ref 0.2–1.2)
Total Protein: 6.9 g/dL (ref 6.1–8.1)
eGFR: 67 mL/min/{1.73_m2} (ref >=60)

## 2023-03-31 ENCOUNTER — Ambulatory Visit (INDEPENDENT_AMBULATORY_CARE_PROVIDER_SITE_OTHER): Payer: 59 | Admitting: Nurse Practitioner

## 2023-03-31 ENCOUNTER — Encounter: Payer: Self-pay | Admitting: Nurse Practitioner

## 2023-03-31 VITALS — BP 142/70 | HR 80 | Temp 97.8°F | Ht 60.0 in | Wt 144.0 lb

## 2023-03-31 DIAGNOSIS — Z23 Encounter for immunization: Secondary | ICD-10-CM | POA: Diagnosis not present

## 2023-03-31 DIAGNOSIS — Z0000 Encounter for general adult medical examination without abnormal findings: Secondary | ICD-10-CM | POA: Diagnosis not present

## 2023-03-31 NOTE — Patient Instructions (Signed)
  Tammie [name] , Thank you for taking time to come for your Medicare Wellness Visit. I appreciate your ongoing commitment to your health goals. Please review the following plan we discussed and let me know if I can assist you in the future.   These are the goals we discussed:  Goals      Exercise 3x per week (30 min per time)     Patient would like to start exercising 3 days a week.     Patient Stated     To get dental work completely         This is a list of the screening recommended for you and due dates:  Health Maintenance  Topic Date Due   COVID-19 Vaccine (6 - 2023-24 season) 04/05/2022   Mammogram  05/31/2023   Eye exam for diabetics  09/20/2023   Hemoglobin A1C  09/28/2023   Yearly kidney health urinalysis for diabetes  10/01/2023   Yearly kidney function blood test for diabetes  03/27/2024   Complete foot exam   03/27/2024   Medicare Annual Wellness Visit  03/30/2024   Pap Smear  05/27/2025   Colon Cancer Screening  02/07/2026   DTaP/Tdap/Td vaccine (3 - Td or Tdap) 07/23/2027   Flu Shot  Completed   HIV Screening  Completed   Zoster (Shingles) Vaccine  Completed   HPV Vaccine  Aged Out   Hepatitis C Screening  Discontinued    To call Dr Dione Booze and make diabetic eye exam.

## 2023-03-31 NOTE — Progress Notes (Signed)
Subjective:   Tammie [name] is a 62 y.o. female who presents for Medicare Annual (Subsequent) preventive examination.  Visit Complete: In person [facility]   Review of Systems    Cardiac Risk Factors include: advanced age (>69men, >64 women);hypertension;diabetes mellitus;dyslipidemia     Objective:    Today's Vitals   03/31/23 1400 03/31/23 1409  BP: (!) 140/70 (!) 142/70  Pulse: 80   Temp: 97.8 F (36.6 C)   SpO2: 96%   Weight: 144 lb (65.3 kg)   Height: 5' (1.524 m)    Body mass index is 28.12 kg/m.     03/31/2023    2:05 PM 03/28/2023    8:54 AM 09/30/2022   10:23 AM 05/27/2022    8:41 AM 03/25/2022   10:30 AM 02/25/2022   10:52 AM 08/27/2021   10:40 AM  Advanced Directives  Does Patient Have a Medical Advance Directive? No No No No No No No  Would patient like information on creating a medical advance directive? No - Patient declined No - Patient declined  Yes (MAU/Ambulatory/Procedural Areas - Information given) No - Patient declined No - Patient declined Yes (MAU/Ambulatory/Procedural Areas - Information given)    Current Medications (verified) Outpatient Encounter Medications as of 03/31/2023  Medication Sig   acetaminophen (TYLENOL) 160 MG/5ML suspension Take by mouth every 6 (six) hours as needed.   albuterol (VENTOLIN HFA) 108 (90 Base) MCG/ACT inhaler INHALE 2 PUFFS INTO THE LUNGS EVERY 6 HOURS AS NEEDED FOR WHEEZING OR SHORTNESS OF BREATH   amLODipine (NORVASC) 10 MG tablet TAKE 1 TABLET(10 MG) BY MOUTH DAILY FOR HIGH BLOOD PRESSURE   bisoprolol (ZEBETA) 5 MG tablet TAKE 1/2 TABLET BY MOUTH EVERY DAY FOR HIGH BLOOD PRESSURE   cetirizine (ZYRTEC) 10 MG tablet Take 1 tablet (10 mg total) by mouth daily.   dupilumab (DUPIXENT) 200 MG/1. prefilled syringe Inject 300 mg into the skin every 14 (fourteen) days.   DUPIXENT 300 MG/2ML prefilled syringe INJECT 300MG  SUBCUTANEOUSLY  EVERY OTHER WEEK   ezetimibe (ZETIA) 10 MG tablet TAKE 1 TABLET(10 MG) BY MOUTH DAILY    fluticasone (FLONASE) 50 MCG/ACT nasal spray Place 2 sprays into both nostrils daily. SHAKE LIQUID AND USE 2 SPRAYS IN EACH NOSTRIL DAILY   fluticasone-salmeterol (WIXELA INHUB) 250-50 MCG/ACT AEPB Inhale 1 puff into the lungs in the morning and at bedtime.   hydrALAZINE (APRESOLINE) 25 MG tablet TAKE 1 TABLET BY MOUTH EVERY 8 HOURS   latanoprost (XALATAN) 0.005 % ophthalmic solution 1 drop at bedtime.   LORazepam (ATIVAN) 0.5 MG tablet 1 tablet before dental visit   montelukast (SINGULAIR) 10 MG tablet Take 1 tablet (10 mg total) by mouth at bedtime.   Omega-3 Fatty Acids (FISH OIL) 500 MG CAPS Take 1 capsule by mouth daily.   potassium chloride SA (KLOR-CON M) 20 MEQ tablet Take 1 tablet (20 mEq total) by mouth daily.   triamcinolone ointment (KENALOG) 0.5 % APPLY TOPICALLY TO THE AFFECTED AREA(S) TWICE DAILY   [DISCONTINUED] dupilumab (DUPIXENT) prefilled syringe 300 mg    No facility-administered encounter medications on file as of 03/31/2023.    Allergies (verified) Ace inhibitors, Aspirin, Doxepin, Ibuprofen, Nsaids, and Lipitor [atorvastatin]   History: Past Medical History:  Diagnosis Date   Allergy    Anaphylaxis to ASA   Anemia    Anxiety    Eczema    Environmental allergies    GERD (gastroesophageal reflux disease)    Patient denies   Headache(784.0)    Hx of adenomatous colonic  polyps    Hyperlipidemia    Hypertension    Moderate intermittent asthma    Pre-diabetes    Pre-diabetes    Respiratory failure, acute (HCC) 04/19/2014   Related to potential Anaphylaxis ? presumably to ASA - exacerbated by Moderate to Severe Asthma   Past Surgical History:  Procedure Laterality Date   COLONOSCOPY     NASAL SINUS SURGERY     Polyps   POLYPECTOMY     TRANSTHORACIC ECHOCARDIOGRAM  04/2014   in setting of cardiopulmonary arrest): EF 65 to 70%.  Mild concentric LVH.  GR 1 DD.  Mild AI.;;  August 2019: EF 55 to 60%.  Mild LVH.  GR 1 DD.  Mild AI/MR.  Mildly increased PA  pressures.   Family History  Problem Relation Age of Onset   Heart attack Father    Heart attack Mother    Diabetes Sister    High blood pressure Brother    Hypertension Brother    Asthma Brother    High blood pressure Sister    Cancer Sister    Lung cancer Sister    High blood pressure Son    Cancer Maternal Grandmother    Heart disease Paternal Grandmother    Pancreatic cancer Paternal Grandmother    Asthma Other        Great paternal grandfather    Autism Son    Asthma Other        Neice    Colon cancer Neg Hx    Esophageal cancer Neg Hx    Rectal cancer Neg Hx    Stomach cancer Neg Hx    Allergic rhinitis Neg Hx    Angioedema Neg Hx    Eczema Neg Hx    Immunodeficiency Neg Hx    Atopy Neg Hx    Colon polyps Neg Hx    Social History   Socioeconomic History   Marital status: Single    Spouse name: Not on file   Number of children: Not on file   Years of education: Not on file   Highest education level: Not on file  Occupational History   Not on file  Tobacco Use   Smoking status: Never   Smokeless tobacco: Never  Vaping Use   Vaping status: Never Used  Substance and Sexual Activity   Alcohol use: No   Drug use: No   Sexual activity: Not Currently  Other Topics Concern   Not on file  Social History Narrative   Diet-Salt Free   Drinks Soda-Caffeine    Lives in an apartment, one stories, one person in home, no pets   Current/past profession: Dietary, dish-room, and cooks    Exercises 2 x weekly   Social Determinants of Health   Financial Resource Strain: Low Risk  (03/10/2018)   Overall Financial Resource Strain (CARDIA)    Difficulty of Paying Living Expenses: Not hard at all  Food Insecurity: No Food Insecurity (03/10/2018)   Hunger Vital Sign    Worried About Running Out of Food in the Last Year: Never true    Ran Out of Food in the Last Year: Never true  Transportation Needs: No Transportation Needs (03/10/2018)   PRAPARE - Doctor, general practice (Medical): No    Lack of Transportation (Non-Medical): No  Physical Activity: Inactive (03/10/2018)   Exercise Vital Sign    Days of Exercise per Week: 0 days    Minutes of Exercise per Session: 0 min  Stress: No Stress  Concern Present (03/10/2018)   [employer] of Occupational Health - Occupational Stress Questionnaire    Feeling of Stress : Only a little  Social Connections: Somewhat Isolated (03/10/2018)   Social Connection and Isolation Panel [NHANES]    Frequency of Communication with Friends and Family: More than three times a week    Frequency of Social Gatherings with Friends and Family: More than three times a week    Attends Religious Services: More than 4 times per year    Active Member of [employer] or Organizations: No    Attends Engineer, structural: Never    Marital Status: Never married    Tobacco Counseling Counseling given: Not Answered   Clinical Intake:  Pre-visit preparation completed: Yes  Pain : No/denies pain     BMI - recorded: 28 Nutritional Status: BMI 25 -29 Overweight Nutritional Risks: None Diabetes: Yes  How often do you need to have someone help you when you read instructions, pamphlets, or other written materials from your doctor or pharmacy?: 1 - Never         Activities of Daily Living    03/31/2023    2:16 PM  In your present state of health, do you have any difficulty performing the following activities:  Hearing? 0  Vision? 1  Difficulty concentrating or making decisions? 0  Walking or climbing stairs? 0  Dressing or bathing? 0  Doing errands, shopping? 0  Preparing Food and eating ? N  Using the Toilet? N  In the past six months, have you accidently leaked urine? N  Do you have problems with loss of bowel control? N  Managing your Medications? N  Managing your Finances? N    Patient Care Team: Sharon Seller, NP as PCP - General (Geriatric Medicine) Sallye Lat, MD as Consulting  Physician (Ophthalmology) Luciano Cutter, MD as Consulting Physician (Pulmonary Disease) Alfonse Spruce, MD as Consulting Physician (Allergy and Immunology)  Indicate any recent Medical Services you may have received from other than [facility] providers in the past year (date may be approximate).     Assessment:   This is a routine wellness examination for Conni.  Hearing/Vision screen No results found.  Dietary issues and exercise activities discussed:     Goals Addressed             This Visit's Progress    Patient Stated       To get dental work completely        Depression Screen    03/31/2023    2:06 PM 03/28/2023    8:54 AM 09/30/2022   10:23 AM 03/25/2022   10:42 AM 02/25/2022   10:49 AM 08/27/2021   11:37 AM 03/20/2021    1:13 PM  PHQ 2/9 Scores  PHQ - 2 Score 0 0 0 0 0 0 0    Fall Risk    03/31/2023    2:06 PM 03/28/2023    8:54 AM 09/30/2022   10:23 AM 05/27/2022    8:40 AM 03/25/2022   10:42 AM  Fall Risk   Falls in the past year? 0 0 0 0 0  Number falls in past yr: 0 0 0 0 0  Injury with Fall? 0 0 0 0 0  Risk for fall due to :  No Fall Risks  No Fall Risks No Fall Risks  Follow up  Falls evaluation completed Falls evaluation completed Falls evaluation completed Falls evaluation completed    MEDICARE RISK AT HOME:  TIMED UP AND GO:  Was the test performed?  No    Cognitive Function:    02/07/2017    9:17 AM  MMSE - Mini Mental State Exam  Not completed: --        03/31/2023    2:06 PM 03/20/2021    1:17 PM 03/14/2020   10:50 AM  6CIT Screen  What Year? 0 points 0 points 0 points  What month? 0 points 0 points 0 points  What time? 0 points 0 points 0 points  Count back from 20 0 points 0 points 0 points  Months in reverse 0 points 2 points 0 points  Repeat phrase 2 points 4 points 4 points  Total Score 2 points 6 points 4 points    Immunizations Immunization History  Administered Date(s) Administered   Fluad Quad(high Dose  65+) 05/10/2020, 08/27/2021, 05/27/2022   Fluad Trivalent(High Dose 65+) 03/31/2023   Influenza Inj Mdck Quad Pf 06/29/2019   Influenza,inj,Quad PF,6+ Mos 05/26/2014, 06/21/2016, 05/27/2017, 05/19/2018, 03/30/2019   Influenza-Unspecified 05/06/2015, 05/23/2017   PFIZER(Purple Top)SARS-COV-2 Vaccination 10/21/2019, 11/16/2019, 06/23/2020, 02/14/2021   Pfizer Covid-19 Vaccine Bivalent Booster 13yrs & up 06/08/2021   Pneumococcal Conjugate-13 12/17/2016   Pneumococcal Polysaccharide-23 03/12/2019, 06/29/2019   Tdap 10/04/2016, 07/22/2017   Zoster Recombinant(Shingrix) 12/17/2016, 02/19/2017    TDAP status: Up to date  Flu Vaccine status: Up to date  Pneumococcal vaccine status: Up to date  Covid-19 vaccine status: Information provided on how to obtain vaccines.   Qualifies for Shingles Vaccine? Yes   Zostavax completed No   Shingrix Completed?: Yes  Screening Tests Health Maintenance  Topic Date Due   COVID-19 Vaccine (6 - 2023-24 season) 04/05/2022   MAMMOGRAM  05/31/2023   OPHTHALMOLOGY EXAM  09/20/2023   HEMOGLOBIN A1C  09/28/2023   Diabetic kidney evaluation - Urine ACR  10/01/2023   Diabetic kidney evaluation - eGFR measurement  03/27/2024   FOOT EXAM  03/27/2024   Medicare Annual Wellness (AWV)  03/30/2024   PAP SMEAR-Modifier  05/27/2025   Colonoscopy  02/07/2026   DTaP/Tdap/Td (3 - Td or Tdap) 07/23/2027   INFLUENZA VACCINE  Completed   HIV Screening  Completed   Zoster Vaccines- Shingrix  Completed   HPV VACCINES  Aged Out   Hepatitis C Screening  Discontinued    Health Maintenance  Health Maintenance Due  Topic Date Due   COVID-19 Vaccine (6 - 2023-24 season) 04/05/2022    Colorectal cancer screening: Type of screening: Colonoscopy. Completed 02/07/21. Repeat every 5 years  Mammogram status: Completed 05/30/21. Repeat every year  Bone Density status: Ordered today. Pt provided with contact info and advised to call to schedule appt.  Lung Cancer  Screening: (Low Dose CT Chest recommended if Age 55-80 years, 20 pack-year currently smoking OR have quit w/in 15years.) does not qualify.   Lung Cancer Screening Referral: na  Additional Screening:  Hepatitis C Screening: does qualify; Completed   Vision Screening: Recommended annual ophthalmology exams for early detection of glaucoma and other disorders of the eye. Is the patient up to date with their annual eye exam?  Yes  Who is the provider or what is the name of the office in which the patient attends annual eye exams? [facility] If pt is not established with a provider, would they like to be referred to a provider to establish care? No .   Dental Screening: Recommended annual dental exams for proper oral hygiene  Diabetic Foot Exam: Diabetic Foot Exam: Completed 03/31/2023  [facility]  Referral / Chronic Care Management: CRR required this visit?  No   CCM required this visit?  No     Plan:     I have personally reviewed and noted the following in the patient's chart:   Medical and social history Use of alcohol, tobacco or illicit drugs  Current medications and supplements including opioid prescriptions. Patient is not currently taking opioid prescriptions. Functional ability and status Nutritional status Physical activity Advanced directives List of other physicians Hospitalizations, surgeries, and ER visits in previous 12 months Vitals Screenings to include cognitive, depression, and falls Referrals and appointments  In addition, I have reviewed and discussed with patient certain preventive protocols, quality metrics, and best practice recommendations. A written personalized care plan for preventive services as well as general preventive health recommendations were provided to patient.     Sharon Seller, NP   03/31/2023

## 2023-04-01 ENCOUNTER — Ambulatory Visit: Payer: 59 | Admitting: *Deleted

## 2023-04-04 ENCOUNTER — Ambulatory Visit (INDEPENDENT_AMBULATORY_CARE_PROVIDER_SITE_OTHER): Payer: 59

## 2023-04-04 DIAGNOSIS — J4550 Severe persistent asthma, uncomplicated: Secondary | ICD-10-CM | POA: Diagnosis not present

## 2023-04-04 MED ORDER — DUPILUMAB 300 MG/2ML ~~LOC~~ SOSY
300.0000 mg | PREFILLED_SYRINGE | SUBCUTANEOUS | Status: AC
Start: 2023-04-04 — End: ?
  Administered 2023-04-04 – 2023-05-14 (×3): 300 mg via SUBCUTANEOUS

## 2023-04-18 ENCOUNTER — Ambulatory Visit: Payer: 59

## 2023-04-19 ENCOUNTER — Other Ambulatory Visit: Payer: Self-pay | Admitting: Nurse Practitioner

## 2023-04-19 ENCOUNTER — Other Ambulatory Visit: Payer: Self-pay | Admitting: Allergy & Immunology

## 2023-04-29 ENCOUNTER — Ambulatory Visit (INDEPENDENT_AMBULATORY_CARE_PROVIDER_SITE_OTHER): Payer: 59

## 2023-04-29 DIAGNOSIS — J4550 Severe persistent asthma, uncomplicated: Secondary | ICD-10-CM

## 2023-05-05 ENCOUNTER — Other Ambulatory Visit: Payer: Self-pay | Admitting: Nurse Practitioner

## 2023-05-05 NOTE — Telephone Encounter (Signed)
Patient has this medication refilled by someone else. Do you still refill this?

## 2023-05-14 ENCOUNTER — Ambulatory Visit: Payer: 59 | Admitting: *Deleted

## 2023-05-14 DIAGNOSIS — J4550 Severe persistent asthma, uncomplicated: Secondary | ICD-10-CM

## 2023-05-21 ENCOUNTER — Other Ambulatory Visit: Payer: Self-pay | Admitting: Nurse Practitioner

## 2023-05-21 DIAGNOSIS — E876 Hypokalemia: Secondary | ICD-10-CM

## 2023-05-21 DIAGNOSIS — I10 Essential (primary) hypertension: Secondary | ICD-10-CM

## 2023-05-28 ENCOUNTER — Ambulatory Visit: Payer: 59

## 2023-06-20 ENCOUNTER — Telehealth: Payer: Self-pay | Admitting: Nurse Practitioner

## 2023-06-20 ENCOUNTER — Telehealth: Payer: 59

## 2023-06-20 NOTE — Telephone Encounter (Signed)
Patient brought in [employer] forms to be completed say that she need to have them back before 07/03/2023 so she can turn them in. Forms given to Chrae in [place].

## 2023-06-20 NOTE — Telephone Encounter (Signed)
[employer] forms dropped off at the front desk and given to me by patient care advocate- Jaquita Rector.  Forms completed and placed in [place], Janene Harvey, NP for review and signature, in review and sign folder. Once forms are completed they are to be given to the administrative team for further processing.

## 2023-06-23 NOTE — Telephone Encounter (Signed)
Patient notified and stated that she will come by tomorrow and pick up and complete her portion.   Sent copy to scan and left original up front for pick up.

## 2023-06-23 NOTE — Telephone Encounter (Signed)
Completed but patient has not finished her part so she will need to come back to office to pick up and completed, given to CI.

## 2023-07-01 ENCOUNTER — Telehealth: Payer: Self-pay | Admitting: Allergy & Immunology

## 2023-07-01 NOTE — Telephone Encounter (Signed)
Called to schedule Dupixent reapproval appointment before 12/31.

## 2023-07-06 ENCOUNTER — Other Ambulatory Visit: Payer: Self-pay | Admitting: Allergy & Immunology

## 2023-07-08 ENCOUNTER — Other Ambulatory Visit: Payer: Self-pay

## 2023-07-08 MED ORDER — COMIRNATY 30 MCG/0.3ML IM SUSY
0.3000 mL | PREFILLED_SYRINGE | Freq: Once | INTRAMUSCULAR | 0 refills | Status: AC
  Filled 2023-07-08: qty 0.3, 1d supply, fill #0

## 2023-07-08 MED ORDER — INFLUENZA VIRUS VACC SPLIT PF (FLUZONE) 0.5 ML IM SUSY
0.5000 mL | PREFILLED_SYRINGE | Freq: Once | INTRAMUSCULAR | 0 refills | Status: DC
  Filled 2023-07-08: qty 0.5, 1d supply, fill #0

## 2023-07-12 ENCOUNTER — Other Ambulatory Visit: Payer: Self-pay | Admitting: Allergy & Immunology

## 2023-08-21 ENCOUNTER — Other Ambulatory Visit: Payer: Self-pay | Admitting: Nurse Practitioner

## 2023-08-21 DIAGNOSIS — I10 Essential (primary) hypertension: Secondary | ICD-10-CM

## 2023-09-29 ENCOUNTER — Encounter: Payer: Self-pay | Admitting: Nurse Practitioner

## 2023-09-29 ENCOUNTER — Ambulatory Visit (INDEPENDENT_AMBULATORY_CARE_PROVIDER_SITE_OTHER): Payer: 59 | Admitting: Nurse Practitioner

## 2023-09-29 VITALS — BP 124/72 | HR 79 | Temp 97.4°F | Resp 17 | Ht 60.0 in | Wt 149.8 lb

## 2023-09-29 DIAGNOSIS — J3089 Other allergic rhinitis: Secondary | ICD-10-CM

## 2023-09-29 DIAGNOSIS — K089 Disorder of teeth and supporting structures, unspecified: Secondary | ICD-10-CM

## 2023-09-29 DIAGNOSIS — J4540 Moderate persistent asthma, uncomplicated: Secondary | ICD-10-CM

## 2023-09-29 DIAGNOSIS — E0869 Diabetes mellitus due to underlying condition with other specified complication: Secondary | ICD-10-CM | POA: Diagnosis not present

## 2023-09-29 DIAGNOSIS — R079 Chest pain, unspecified: Secondary | ICD-10-CM | POA: Diagnosis not present

## 2023-09-29 DIAGNOSIS — I10 Essential (primary) hypertension: Secondary | ICD-10-CM | POA: Diagnosis not present

## 2023-09-29 DIAGNOSIS — E782 Mixed hyperlipidemia: Secondary | ICD-10-CM

## 2023-09-29 DIAGNOSIS — Z1231 Encounter for screening mammogram for malignant neoplasm of breast: Secondary | ICD-10-CM | POA: Diagnosis not present

## 2023-09-29 NOTE — Progress Notes (Signed)
 Careteam: Patient Care Team: Tammie Seller, NP as PCP - General (Geriatric Medicine) Tammie Lat, MD as Consulting Physician (Ophthalmology) Tammie Cutter, MD as Consulting Physician (Pulmonary Disease) Tammie Spruce, MD as Consulting Physician (Allergy and Immunology)  PLACE OF SERVICE:  [facility]  Advanced Directive information Does Patient Have a Medical Advance Directive?: No, Would patient like information on creating a medical advance directive?: No - Patient declined  Allergies  Allergen Reactions   Ace Inhibitors Other (See Comments)    Angioedema   Aspirin Anaphylaxis, Swelling and Other (See Comments)    Throat swelling    Doxepin Anaphylaxis   Ibuprofen Anaphylaxis, Swelling and Other (See Comments)    Throat swelling   Nsaids Anaphylaxis, Swelling and Other (See Comments)   Lipitor [Atorvastatin] Swelling and Other (See Comments)    Face, feet swell    Chief Complaint  Patient presents with   Medical Management of Chronic Issues    6 month follow up Hemoglobin & kidney evaluation  Eye exam scheduled    HPI: pt is a 63 y.o. female  Discussed the use of AI scribe software for clinical note transcription with the patient, who gave verbal consent to proceed.  History of Present Illness   Tammie [name] is a 63 year old female who presents for a routine follow-up visit.  She experiences occasional chest pain, described as soreness in the chest and arm, occurring about once a month and lasting a few hours. The pain does not radiate or cause shortness of breath. The last episode was a week ago during physical activity. She has a family history of heart disease, with both parents having had heart attacks.  She is currently taking bisoprolol and hydralazine and amlodipine for blood pressure which is well controlled   She takes zetia for hyperlipidemia. LDL 67 on last labs.   DM- diet controlled, reports she continues to work on this.     Zyrtec for allergies, and Dupixent injections for allergies and asthma. She uses a wixela inhaler in the morning and evening for asthma.  potassium supplements for hypokalemia.  She is not taking her prescribed eye drops for glaucoma due to issues with obtaining a refill and plans to contact her eye doctor, Tammie [name], to resolve this.  She is due for a mammogram, which she last had in 2022.  She had a recent visit to the dentist where she was unable to proceed with dental surgery due to insurance issues and a high out-of-pocket cost of $4,800. She is still trying to resolve this issue.  Her son, who is autistic and non-verbal, stays with her on weekends and holidays. He resides in a family care setting during the week.       Review of Systems:  Review of Systems  Constitutional:  Negative for chills, fever and weight loss.  HENT:  Negative for tinnitus.   Respiratory:  Negative for cough, sputum production and shortness of breath.   Cardiovascular:  Negative for chest pain, palpitations and leg swelling.  Gastrointestinal:  Negative for abdominal pain, constipation, diarrhea and heartburn.  Genitourinary:  Negative for dysuria, frequency and urgency.  Musculoskeletal:  Negative for back pain, falls, joint pain and myalgias.  Skin: Negative.   Neurological:  Negative for dizziness and headaches.  Psychiatric/Behavioral:  Negative for depression and memory loss. The patient does not have insomnia.     Past Medical History:  Diagnosis Date   Allergy    Anaphylaxis to ASA  Anemia    Anxiety    Eczema    Environmental allergies    GERD (gastroesophageal reflux disease)    Patient denies   Headache(784.0)    Hx of adenomatous colonic polyps    Hyperlipidemia    Hypertension    Moderate intermittent asthma    Pre-diabetes    Pre-diabetes    Respiratory failure, acute (HCC) 04/19/2014   Related to potential Anaphylaxis ? presumably to ASA - exacerbated by Moderate to  Severe Asthma   Past Surgical History:  Procedure Laterality Date   COLONOSCOPY     NASAL SINUS SURGERY     Polyps   POLYPECTOMY     TRANSTHORACIC ECHOCARDIOGRAM  04/2014   in setting of cardiopulmonary arrest): EF 65 to 70%.  Mild concentric LVH.  GR 1 DD.  Mild AI.;;  August 2019: EF 55 to 60%.  Mild LVH.  GR 1 DD.  Mild AI/MR.  Mildly increased PA pressures.   Social History:   reports that she has never smoked. She has never used smokeless tobacco. She reports that she does not drink alcohol and does not use drugs.  Family History  Problem Relation Age of Onset   Heart attack Father    Heart attack Mother    Diabetes Sister    High blood pressure Brother    Hypertension Brother    Asthma Brother    High blood pressure Sister    Cancer Sister    Lung cancer Sister    High blood pressure Son    Cancer Maternal Grandmother    Heart disease Paternal Grandmother    Pancreatic cancer Paternal Grandmother    Asthma Other        Great paternal grandfather    Autism Son    Asthma Other        Neice    Colon cancer Neg Hx    Esophageal cancer Neg Hx    Rectal cancer Neg Hx    Stomach cancer Neg Hx    Allergic rhinitis Neg Hx    Angioedema Neg Hx    Eczema Neg Hx    Immunodeficiency Neg Hx    Atopy Neg Hx    Colon polyps Neg Hx     Medications: Patient's Medications  New Prescriptions   No medications on file  Previous Medications   ACETAMINOPHEN (TYLENOL) 160 MG/5ML SUSPENSION    Take by mouth every 6 (six) hours as needed.   ALBUTEROL (VENTOLIN HFA) 108 (90 BASE) MCG/ACT INHALER    INHALE 2 PUFFS INTO THE LUNGS EVERY 6 HOURS AS NEEDED FOR WHEEZING OR SHORTNESS OF BREATH   AMLODIPINE (NORVASC) 10 MG TABLET    TAKE 1 TABLET(10 MG) BY MOUTH DAILY FOR HIGH BLOOD PRESSURE   BISOPROLOL (ZEBETA) 5 MG TABLET    TAKE 1/2 TABLET BY MOUTH EVERY DAY FOR HIGH BLOOD PRESSURE   CETIRIZINE (ZYRTEC) 10 MG TABLET    TAKE 1 TABLET(10 MG) BY MOUTH DAILY   DUPILUMAB (DUPIXENT) 200  MG/1. PREFILLED SYRINGE    Inject 300 mg into the skin every 14 (fourteen) days.   DUPIXENT 300 MG/2ML PREFILLED SYRINGE    INJECT 300MG  SUBCUTANEOUSLY  EVERY OTHER WEEK   EZETIMIBE (ZETIA) 10 MG TABLET    TAKE 1 TABLET(10 MG) BY MOUTH DAILY   FLUTICASONE (FLONASE) 50 MCG/ACT NASAL SPRAY    Place 2 sprays into both nostrils daily. SHAKE LIQUID AND USE 2 SPRAYS IN EACH NOSTRIL DAILY   FLUTICASONE-SALMETEROL (WIXELA INHUB) 250-50 MCG/ACT AEPB  Inhale 1 puff into the lungs in the morning and at bedtime.   HYDRALAZINE (APRESOLINE) 25 MG TABLET    TAKE 1 TABLET(25 MG) BY MOUTH EVERY 8 HOURS   LATANOPROST (XALATAN) 0.005 % OPHTHALMIC SOLUTION    1 drop at bedtime.   MONTELUKAST (SINGULAIR) 10 MG TABLET    Take 1 tablet (10 mg total) by mouth at bedtime.   OMEGA-3 FATTY ACIDS (FISH OIL) 500 MG CAPS    Take 1 capsule by mouth daily.   POTASSIUM CHLORIDE SA (KLOR-CON M) 20 MEQ TABLET    TAKE 1 TABLET(20 MEQ) BY MOUTH DAILY   TRIAMCINOLONE OINTMENT (KENALOG) 0.5 %    APPLY TOPICALLY TO THE AFFECTED AREA(S) TWICE DAILY  Modified Medications   No medications on file  Discontinued Medications   LORAZEPAM (ATIVAN) 0.5 MG TABLET    1 tablet before dental visit    Physical Exam:  Vitals:   09/29/23 1015  BP: 124/72  Pulse: 79  Resp: 17  Temp: (!) 97.4 F (36.3 C)  SpO2: 95%  Weight: 149 lb 12.8 oz (67.9 kg)  Height: 5' (1.524 m)   Body mass index is 29.26 kg/m. Wt Readings from Last 3 Encounters:  09/29/23 149 lb 12.8 oz (67.9 kg)  03/31/23 144 lb (65.3 kg)  03/28/23 144 lb (65.3 kg)    Physical Exam Constitutional:      General: She is not in acute distress.    Appearance: She is well-developed. She is not diaphoretic.  HENT:     Head: Normocephalic and atraumatic.     Mouth/Throat:     Pharynx: No oropharyngeal exudate.     Comments: Poor dentition  Eyes:     Conjunctiva/sclera: Conjunctivae normal.     Pupils: Pupils are equal, round, and reactive to light.  Cardiovascular:      Rate and Rhythm: Normal rate and regular rhythm.     Heart sounds: Normal heart sounds.  Pulmonary:     Effort: Pulmonary effort is normal.     Breath sounds: Normal breath sounds.  Abdominal:     General: Bowel sounds are normal.     Palpations: Abdomen is soft.  Musculoskeletal:     Cervical back: Normal range of motion and neck supple.     Right lower leg: No edema.     Left lower leg: No edema.  Skin:    General: Skin is warm and dry.  Neurological:     Mental Status: She is alert.  Psychiatric:        Mood and Affect: Mood normal.     Labs reviewed: Basic Metabolic Panel: Recent Labs    09/30/22 0000 03/28/23 0953  NA 140 143  K 3.4* 4.3  CL 105 107  CO2 27 30  GLUCOSE 119* 93  BUN 10 10  CREATININE 1.02 0.95  CALCIUM 9.7 10.1   Liver Function Tests: Recent Labs    09/30/22 0000 03/28/23 0953  AST 16 19  ALT 19 19  BILITOT 0.6 0.6  PROT 7.4 6.9   No results for input(s): "LIPASE", "AMYLASE" in the last 8760 hours. No results for input(s): "AMMONIA" in the last 8760 hours. CBC: Recent Labs    09/30/22 0000 03/28/23 0953  WBC 7.7 5.4  NEUTROABS 3,904 2,344  HGB 14.0 13.4  HCT 43.2 41.1  MCV 81.7 83.0  PLT 377 344   Lipid Panel: Recent Labs    03/28/23 0953  CHOL 133  HDL 42*  LDLCALC 67  TRIG 161*  CHOLHDL 3.2   TSH: No results for input(s): "TSH" in the last 8760 hours. A1C: Lab Results  Component Value Date   HGBA1C 6.0 (H) 03/28/2023     Assessment/Plan Assessment and Plan    Chest Pain Occasional chest pain with exertion, lasting a few hours, associated with arm soreness. Strong family history of heart disease. No shortness of breath reported. -Order EKG today without acute changes -referral to Cardiology made due to family hx and chest pains. Education provided on when to go to ED, she is in agreement.   Hypertension Blood pressure well-controlled on current regimen of bisoprolol, amlodipine, and  hydralazine. -Continue current medications.  Hyperlipidemia On Zetia for cholesterol management. -Continue Zetia.  Hypokalemia On potassium supplementation. -Continue potassium supplementation.  Asthma/Allergies On Dupixent injection, Zyrtec, and Lyleq inhaler. Reports improvement in symptoms. -Continue current medications.  Dental Health Difficulty obtaining needed dental surgery due to insurance issues. -No changes to plan at this time. She plans to find another surgeon to see if she can get more financial assistance.   Breast Health Due for mammogram, last done in 2022. -Order mammogram. -Provide patient with contact information for [facility].  Diabetes Last A1C was at goal, patient reports efforts to limit sugar intake. -Encouraged dietary compliance, routine foot care/monitoring and to keep up with diabetic eye exams through ophthalmology  -Order A1C today.  Overweight, BMI 29.  Noted slight weight gain. -Encourage continued efforts to maintain a healthy diet and regular physical activity.  Eye Health Not currently using prescribed eye drops for glaucoma. -Encourage patient to schedule appointment with Tammie [name] for eye exam and discuss medication refill.     Return in about 6 months (around 03/28/2024) for routine follow up, labs at time of visit.  Tammie [name]. Tammie [name] [facility] [phone]

## 2023-09-29 NOTE — Patient Instructions (Addendum)
 Call to schedule mammogram- [phone]  To call [facility] and make appt- make sure you get refill on you eye drops

## 2023-09-30 ENCOUNTER — Encounter: Payer: Self-pay | Admitting: Nurse Practitioner

## 2023-09-30 LAB — COMPLETE METABOLIC PANEL WITH GFR
AG Ratio: 1.3 (calc) (ref 1.0–2.5)
ALT: 21 U/L (ref 6–29)
AST: 19 U/L (ref 10–35)
Albumin: 4 g/dL (ref 3.6–5.1)
Alkaline phosphatase (APISO): 113 U/L (ref 37–153)
BUN: 8 mg/dL (ref 7–25)
CO2: 29 mmol/L (ref 20–32)
Calcium: 9.6 mg/dL (ref 8.6–10.4)
Chloride: 106 mmol/L (ref 98–110)
Creat: 0.91 mg/dL (ref 0.50–1.05)
Globulin: 3.1 g/dL (ref 1.9–3.7)
Glucose, Bld: 104 mg/dL — ABNORMAL HIGH (ref 65–99)
Potassium: 3.8 mmol/L (ref 3.5–5.3)
Sodium: 141 mmol/L (ref 135–146)
Total Bilirubin: 0.5 mg/dL (ref 0.2–1.2)
Total Protein: 7.1 g/dL (ref 6.1–8.1)
eGFR: 70 mL/min/{1.73_m2} (ref >=60)

## 2023-09-30 LAB — CBC WITH DIFFERENTIAL/PLATELET
Absolute Lymphocytes: 1949 {cells}/uL (ref 850–3900)
Absolute Monocytes: 409 {cells}/uL (ref 200–950)
Basophils Absolute: 62 {cells}/uL (ref 0–200)
Basophils Relative: 1.1 %
Eosinophils Absolute: 179 {cells}/uL (ref 15–500)
Eosinophils Relative: 3.2 %
HCT: 43 % (ref 35.0–45.0)
Hemoglobin: 14.2 g/dL (ref 11.7–15.5)
MCH: 27 pg (ref 27.0–33.0)
MCHC: 33 g/dL (ref 32.0–36.0)
MCV: 81.9 fL (ref 80.0–100.0)
MPV: 10.1 fL (ref 7.5–12.5)
Monocytes Relative: 7.3 %
Neutro Abs: 3002 {cells}/uL (ref 1500–7800)
Neutrophils Relative %: 53.6 %
Platelets: 332 10*3/uL (ref 140–400)
RBC: 5.25 10*6/uL — ABNORMAL HIGH (ref 3.80–5.10)
RDW: 13.1 % (ref 11.0–15.0)
Total Lymphocyte: 34.8 %
WBC: 5.6 10*3/uL (ref 3.8–10.8)

## 2023-09-30 LAB — MICROALBUMIN / CREATININE URINE RATIO
Creatinine, Urine: 75 mg/dL (ref 20–275)
Microalb Creat Ratio: 1967 mg/g{creat} — ABNORMAL HIGH (ref <30)
Microalb, Ur: 147.5 mg/dL

## 2023-09-30 LAB — HEMOGLOBIN A1C
Hgb A1c MFr Bld: 6 %{Hb} — ABNORMAL HIGH (ref <5.7)
Mean Plasma Glucose: 126 mg/dL
eAG (mmol/L): 7 mmol/L

## 2023-09-30 LAB — LIPID PANEL
Cholesterol: 142 mg/dL (ref <200)
HDL: 49 mg/dL — ABNORMAL LOW (ref >=50)
LDL Cholesterol (Calc): 74 mg/dL
Non-HDL Cholesterol (Calc): 93 mg/dL (ref <130)
Total CHOL/HDL Ratio: 2.9 (calc) (ref <5.0)
Triglycerides: 106 mg/dL (ref <150)

## 2023-10-21 ENCOUNTER — Ambulatory Visit
Admission: RE | Admit: 2023-10-21 | Discharge: 2023-10-21 | Disposition: A | Discharge status: Home / Self Care | Source: Ambulatory Visit | Attending: Nurse Practitioner | Admitting: Nurse Practitioner

## 2023-10-21 DIAGNOSIS — Z1231 Encounter for screening mammogram for malignant neoplasm of breast: Secondary | ICD-10-CM

## 2023-11-27 ENCOUNTER — Other Ambulatory Visit: Payer: Self-pay | Admitting: *Deleted

## 2023-11-27 MED ORDER — FLUTICASONE-SALMETEROL 250-50 MCG/ACT IN AEPB: 1.0000 | INHALATION_SPRAY | Freq: Two times a day (BID) | RESPIRATORY_TRACT | 0 refills | Status: AC

## 2023-12-16 ENCOUNTER — Other Ambulatory Visit: Payer: Self-pay | Admitting: Nurse Practitioner

## 2023-12-16 DIAGNOSIS — I10 Essential (primary) hypertension: Secondary | ICD-10-CM

## 2023-12-18 ENCOUNTER — Encounter: Payer: Self-pay | Admitting: Cardiovascular Disease

## 2023-12-18 ENCOUNTER — Ambulatory Visit: Attending: Cardiovascular Disease | Admitting: Cardiovascular Disease

## 2023-12-18 VITALS — BP 152/82 | HR 80 | Ht 60.0 in | Wt 150.8 lb

## 2023-12-18 DIAGNOSIS — R072 Precordial pain: Secondary | ICD-10-CM

## 2023-12-18 DIAGNOSIS — I351 Nonrheumatic aortic (valve) insufficiency: Secondary | ICD-10-CM | POA: Diagnosis not present

## 2023-12-18 LAB — BASIC METABOLIC PANEL WITH GFR
BUN/Creatinine Ratio: 7 — ABNORMAL LOW (ref 12–28)
BUN: 7 mg/dL — ABNORMAL LOW (ref 8–27)
CO2: 20 mmol/L (ref 20–29)
Calcium: 10 mg/dL (ref 8.7–10.3)
Chloride: 106 mmol/L (ref 96–106)
Creatinine, Ser: 0.94 mg/dL (ref 0.57–1.00)
Glucose: 100 mg/dL — ABNORMAL HIGH (ref 70–99)
Potassium: 3.9 mmol/L (ref 3.5–5.2)
Sodium: 143 mmol/L (ref 134–144)
eGFR: 67 mL/min/{1.73_m2} (ref >59)

## 2023-12-18 MED ORDER — METOPROLOL TARTRATE 100 MG PO TABS: 100.0000 mg | ORAL_TABLET | Freq: Once | ORAL | 0 refills | Status: DC

## 2023-12-18 NOTE — Patient Instructions (Addendum)
 Medication Instructions:  No changes *If you need a refill on your cardiac medications before your next appointment, please call your pharmacy*  Lab Work: Today - bmet If you have labs (blood work) drawn today and your tests are completely normal, you will receive your results only by: MyChart Message (if you have MyChart) OR A paper copy in the mail If you have any lab test that is abnormal or we need to change your treatment, we will call you to review the results.  Testing/Procedures: Your physician has requested that you have an echocardiogram. Echocardiography is a painless test that uses sound waves to create images of your heart. It provides your doctor with information about the size and shape of your heart and how well your heart's chambers and valves are working. This procedure takes approximately one hour. There are no restrictions for this procedure. Please do NOT wear cologne, perfume, aftershave, or lotions (deodorant is allowed). Please arrive 15 minutes prior to your appointment time.  Please note: We ask at that you not bring children with you during ultrasound (echo/ vascular) testing. Due to room size and safety concerns, children are not allowed in the ultrasound rooms during exams. Our front office staff cannot provide observation of children in our lobby area while testing is being conducted. An adult accompanying a patient to their appointment will only be allowed in the ultrasound room at the discretion of the ultrasound technician under special circumstances. We apologize for any inconvenience.  Coronary CT Angiogram - see instructions below  Follow-Up: At [facility], you and your health needs are our priority.  As part of our continuing mission to provide you with exceptional heart care, our providers are all part of one team.  This team includes your primary Cardiologist (physician) and Advanced Practice Providers or APPs (Physician Assistants and Nurse  Practitioners) who all work together to provide you with the care you need, when you need it.  Your next appointment:   12 months  Provider:   Antoinette Batman, MD    We recommend signing up for the patient portal called "MyChart".  Sign up information is provided on this After Visit Summary.  MyChart is used to connect with patients for Virtual Visits (Telemedicine).  Patients are able to view lab/test results, encounter notes, upcoming appointments, etc.  Non-urgent messages can be sent to your provider as well.   To learn more about what you can do with MyChart, go to [email or web address].     Your cardiac CT will be scheduled at one of the below locations:   [facility] [address] [place], [place] [address] [phone]  OR  Jeralene Mom. [facility] [address]  [place], [place] [address] Opening December 01, 2023  If scheduled at [facility], please arrive at the [facility] Entrance (Entrance C2) of [facility] 30 minutes prior to test start time. You can use the FREE valet parking offered at entrance C (encouraged to control the heart rate for the test)  Proceed to the [facility] Radiology Department (first floor) to check-in and test prep.   All radiology patients and guests should use entrance C2 at [facility], accessed from [facility], even though the hospital's physical address listed is [address].    If scheduled at the Heart and Vascular Tower at [address], please enter the parking lot using the [address] entrance and use the FREE valet service  at the patient drop-off area. Enter the buidling and check-in with registration on the main floor.   Please follow these instructions carefully (unless otherwise directed):  An IV will be required for this test and Nitroglycerin will be given.   On the Night Before the Test: Be sure to Drink plenty of  water . Do not consume any caffeinated/decaffeinated beverages or chocolate 12 hours prior to your test. Do not take any antihistamines 12 hours prior to your test.  On the Day of the Test: Drink plenty of water  until 1 hour prior to the test. Do not eat any food 1 hour prior to test. You may take your regular medications prior to the test.  Take metoprolol  (Lopressor ) two hours prior to test.  Do not take bisoprolol  that morning. Patients who wear a continuous glucose monitor MUST remove the device prior to scanning. FEMALES- please wear underwire-free bra if available, avoid dresses & tight clothing      After the Test: Drink plenty of water . After receiving IV contrast, you may experience a mild flushed feeling. This is normal. On occasion, you may experience a mild rash up to 24 hours after the test. This is not dangerous. If this occurs, you can take Benadryl  25 mg, Zyrtec , Claritin, or Allegra and increase your fluid intake. (Patients taking Tikosyn should avoid Benadryl , and may take Zyrtec , Claritin, or Allegra) If you experience trouble breathing, this can be serious. If it is severe call 911 IMMEDIATELY. If it is mild, please call our office.  We will call to schedule your test 2-4 weeks out understanding that some insurance companies will need an authorization prior to the service being performed.   For more information and frequently asked questions, please visit our website : [email or web address]  For non-scheduling related questions, please contact the cardiac imaging nurse navigator should you have any questions/concerns: Cardiac Imaging Nurse Navigators Direct Office Dial: [phone]   For scheduling needs, including cancellations and rescheduling, please call [place], [phone].

## 2023-12-18 NOTE — Progress Notes (Signed)
 Chief Complaint  Patient presents with   New Patient (Initial Visit)    Chest pain     History of Present Illness:63 yo female with history of severe asthma, borderline DM, HTN and HLD who is here today to re-establish care in our office. She has been followed in our office by Dr. Addie Holstein but has not been seen here since 2021. She had a respiratory arrest in 2015 secondary to her asthma. She was seen in 2021 by Dr. Addie Holstein for evaluation of chest tightness. She had a normal exercise stress test at that time. Her chest pain was not felt to be cardiac related. Echo in 2019 with LVEF=65-70%. Mild AI. She is statin intolerant.   She tells me today that she has been having chest pain with exertion with associated squeezing in her chest and dyspnea. No LE edema.   Primary Care Physician: Verma Gobble, NP  [facility] Cardiologist: Addie Holstein  Past Medical History:  Diagnosis Date   Allergy    Anaphylaxis to ASA   Anemia    Anxiety    Eczema    Environmental allergies    GERD (gastroesophageal reflux disease)    Patient denies   Headache(784.0)    Hx of adenomatous colonic polyps    Hyperlipidemia    Hypertension    Moderate intermittent asthma    Pre-diabetes    Pre-diabetes    Respiratory failure, acute (HCC) 04/19/2014   Related to potential Anaphylaxis ? presumably to ASA - exacerbated by Moderate to Severe Asthma    Past Surgical History:  Procedure Laterality Date   COLONOSCOPY     NASAL SINUS SURGERY     Polyps   POLYPECTOMY     TRANSTHORACIC ECHOCARDIOGRAM  04/2014   in setting of cardiopulmonary arrest): EF 65 to 70%.  Mild concentric LVH.  GR 1 DD.  Mild AI.;;  August 2019: EF 55 to 60%.  Mild LVH.  GR 1 DD.  Mild AI/MR.  Mildly increased PA pressures.    Current Outpatient Medications  Medication Sig Dispense Refill   acetaminophen  (TYLENOL ) 160 MG/5ML suspension Take by mouth every 6 (six) hours as needed.     albuterol  (VENTOLIN  HFA) 108 (90 Base) MCG/ACT  inhaler INHALE 2 PUFFS INTO THE LUNGS EVERY 6 HOURS AS NEEDED FOR WHEEZING OR SHORTNESS OF BREATH 6.7 g 2   amLODipine  (NORVASC ) 10 MG tablet TAKE 1 TABLET(10 MG) BY MOUTH DAILY FOR HIGH BLOOD PRESSURE 90 tablet 3   bisoprolol  (ZEBETA ) 5 MG tablet TAKE 1/2 TABLET BY MOUTH EVERY DAY FOR HIGH BLOOD PRESSURE 45 tablet 1   cetirizine  (ZYRTEC ) 10 MG tablet TAKE 1 TABLET(10 MG) BY MOUTH DAILY 30 tablet 5   dupilumab  (DUPIXENT ) 200 MG/1. prefilled syringe Inject 300 mg into the skin every 14 (fourteen) days.     DUPIXENT  300 MG/2ML prefilled syringe INJECT 300MG  SUBCUTANEOUSLY  EVERY OTHER WEEK 4 mL 11   ezetimibe  (ZETIA ) 10 MG tablet TAKE 1 TABLET(10 MG) BY MOUTH DAILY 30 tablet 5   fluticasone  (FLONASE ) 50 MCG/ACT nasal spray Place 2 sprays into both nostrils daily. SHAKE LIQUID AND USE 2 SPRAYS IN EACH NOSTRIL DAILY 48 g 1   fluticasone -salmeterol (WIXELA INHUB) 250-50 MCG/ACT AEPB Inhale 1 puff into the lungs in the morning and at bedtime. 60 each 0   hydrALAZINE  (APRESOLINE ) 25 MG tablet TAKE 1 TABLET(25 MG) BY MOUTH EVERY 8 HOURS 90 tablet 5   latanoprost (XALATAN) 0.005 % ophthalmic solution 1 drop at bedtime.  metoprolol  tartrate (LOPRESSOR ) 100 MG tablet Take 1 tablet (100 mg total) by mouth once for 1 dose. Take 90-120 minutes prior to scan. 1 tablet 0   montelukast  (SINGULAIR ) 10 MG tablet Take 1 tablet (10 mg total) by mouth at bedtime. 90 tablet 1   Omega-3 Fatty Acids (FISH OIL) 500 MG CAPS Take 1 capsule by mouth daily.     potassium chloride  SA (KLOR-CON  M) 20 MEQ tablet TAKE 1 TABLET(20 MEQ) BY MOUTH DAILY 30 tablet 5   triamcinolone  ointment (KENALOG ) 0.5 % APPLY TOPICALLY TO THE AFFECTED AREA(S) TWICE DAILY 15 g 5   Current Facility-Administered Medications  Medication Dose Route Frequency Provider Last Rate Last Admin   dupilumab  (DUPIXENT ) prefilled syringe 300 mg  300 mg Subcutaneous Q14 Days Orelia Binet, MD   300 mg at 05/14/23 1610    Allergies  Allergen Reactions    Ace Inhibitors Other (See Comments)    Angioedema   Aspirin Anaphylaxis, Swelling and Other (See Comments)    Throat swelling    Doxepin Anaphylaxis   Ibuprofen Anaphylaxis, Swelling and Other (See Comments)    Throat swelling   Nsaids Anaphylaxis, Swelling and Other (See Comments)   Lipitor [Atorvastatin] Swelling and Other (See Comments)    Face, feet swell    Social History   Socioeconomic History   Marital status: Single    Spouse name: Not on file   Number of children: 1   Years of education: Not on file   Highest education level: Not on file  Occupational History   Occupation: Disability  Tobacco Use   Smoking status: Never   Smokeless tobacco: Never  Vaping Use   Vaping status: Never Used  Substance and Sexual Activity   Alcohol use: No   Drug use: No   Sexual activity: Not Currently  Other Topics Concern   Not on file  Social History Narrative   Diet-Salt Free   Drinks Soda-Caffeine    Lives in an apartment, one stories, one person in home, no pets   Current/past profession: Dietary, dish-room, and cooks    Exercises 2 x weekly   Social Drivers of Health   Financial Resource Strain: Low Risk  (03/10/2018)   Overall Financial Resource Strain (CARDIA)    Difficulty of Paying Living Expenses: Not hard at all  Food Insecurity: No Food Insecurity (03/10/2018)   Hunger Vital Sign    Worried About Running Out of Food in the Last Year: Never true    Ran Out of Food in the Last Year: Never true  Transportation Needs: No Transportation Needs (03/10/2018)   PRAPARE - Administrator, Civil Service (Medical): No    Lack of Transportation (Non-Medical): No  Physical Activity: Inactive (03/10/2018)   Exercise Vital Sign    Days of Exercise per Week: 0 days    Minutes of Exercise per Session: 0 min  Stress: No Stress Concern Present (03/10/2018)   [employer] of Occupational Health - Occupational Stress Questionnaire    Feeling of Stress : Only a little   Social Connections: Somewhat Isolated (03/10/2018)   Social Connection and Isolation Panel [NHANES]    Frequency of Communication with Friends and Family: More than three times a week    Frequency of Social Gatherings with Friends and Family: More than three times a week    Attends Religious Services: More than 4 times per year    Active Member of [employer] or Organizations: No    Attends Banker  Meetings: Never    Marital Status: Never married  Intimate Partner Violence: Not At Risk (03/10/2018)   Humiliation, Afraid, Rape, and Kick questionnaire    Fear of Current or Ex-Partner: No    Emotionally Abused: No    Physically Abused: No    Sexually Abused: No    Family History  Problem Relation Age of Onset   Heart attack Father    Heart attack Mother    Diabetes Sister    High blood pressure Brother    Hypertension Brother    Asthma Brother    High blood pressure Sister    Cancer Sister    Lung cancer Sister    High blood pressure Son    Cancer Maternal Grandmother    Heart disease Paternal Grandmother    Pancreatic cancer Paternal Grandmother    Asthma Other        Great paternal grandfather    Autism Son    Asthma Other        Neice    Colon cancer Neg Hx    Esophageal cancer Neg Hx    Rectal cancer Neg Hx    Stomach cancer Neg Hx    Allergic rhinitis Neg Hx    Angioedema Neg Hx    Eczema Neg Hx    Immunodeficiency Neg Hx    Atopy Neg Hx    Colon polyps Neg Hx     Review of Systems:  As stated in the HPI and otherwise negative.   BP (!) 152/82   Pulse 80   Ht 5' (1.524 m)   Wt 68.4 kg   SpO2 96%   BMI 29.45 kg/m   Physical Examination: General: Well developed, well nourished, NAD  HEENT: OP clear, mucus membranes moist  SKIN: warm, dry. No rashes. Neuro: No focal deficits  Musculoskeletal: Muscle strength 5/5 all ext  Psychiatric: Mood and affect normal  Neck: No JVD, no carotid bruits, no thyromegaly, no lymphadenopathy.  Lungs:Clear  bilaterally, no wheezes, rhonci, crackles Cardiovascular: Regular rate and rhythm. Diastolic murmur.  Abdomen:Soft. Bowel sounds present. Non-tender.  Extremities: No lower extremity edema. Pulses are 2 + in the bilateral DP/PT.  EKG:  EKG is not ordered today. The ekg ordered today demonstrates   Recent Labs: 09/29/2023: ALT 21; BUN 8; Creat 0.91; Hemoglobin 14.2; Platelets 332; Potassium 3.8; Sodium 141   Lipid Panel    Component Value Date/Time   CHOL 142 09/29/2023 1113   CHOL 144 02/07/2016 1607   TRIG 106 09/29/2023 1113   HDL 49 (L) 09/29/2023 1113   HDL 44 02/07/2016 1607   CHOLHDL 2.9 09/29/2023 1113   VLDL 22 02/10/2017 0816   LDLCALC 74 09/29/2023 1113     Wt Readings from Last 3 Encounters:  12/18/23 68.4 kg  09/29/23 67.9 kg  03/31/23 65.3 kg    Assessment and Plan:   1. Chest pain: Cannot exclude angina. Risk factors for CAD include HTN, HLD and borderline DM with FH of CAD. Will arrange coronary CTA to exclude CAD. Will arrange echo to assess LV function  2. Aortic valve insufficiency: Mild by echo in 2019. Repeat echo now.   Labs/ tests ordered today include:   Orders Placed This Encounter  Procedures   CT CORONARY MORPH W/CTA COR W/SCORE W/CA W/CM &/OR WO/CM   Basic Metabolic Panel (BMET)   ECHOCARDIOGRAM COMPLETE   Disposition:   F/U with me in 12 months  Signed, Antoinette Batman, MD, [facility] 12/18/2023 10:29 AM    [facility] [address] [place], [place], [place]  [address] Phone: [phone]; Fax: [phone]

## 2023-12-19 ENCOUNTER — Ambulatory Visit: Payer: Self-pay | Admitting: Cardiovascular Disease

## 2023-12-24 ENCOUNTER — Other Ambulatory Visit: Payer: Self-pay

## 2023-12-24 NOTE — Telephone Encounter (Signed)
 err

## 2024-01-09 ENCOUNTER — Telehealth (HOSPITAL_COMMUNITY): Payer: Self-pay | Admitting: Emergency Medicine

## 2024-01-09 NOTE — Telephone Encounter (Signed)
 Reaching out to patient to offer assistance regarding upcoming cardiac imaging study; pt verbalizes understanding of appt date/time, parking situation and where to check in, pre-test NPO status and medications ordered, and verified current allergies; name and call back number provided for further questions should they arise Rockwell Alexandria RN Navigator Cardiac Imaging [facility] [phone] office [phone] cell

## 2024-01-12 ENCOUNTER — Ambulatory Visit (HOSPITAL_COMMUNITY)
Admission: RE | Admit: 2024-01-12 | Discharge: 2024-01-12 | Disposition: A | Discharge status: Home / Self Care | Source: Ambulatory Visit | Attending: Cardiovascular Disease | Admitting: Cardiovascular Disease

## 2024-01-12 DIAGNOSIS — R079 Chest pain, unspecified: Secondary | ICD-10-CM | POA: Insufficient documentation

## 2024-01-12 DIAGNOSIS — I2510 Atherosclerotic heart disease of native coronary artery without angina pectoris: Secondary | ICD-10-CM | POA: Diagnosis not present

## 2024-01-12 DIAGNOSIS — R072 Precordial pain: Secondary | ICD-10-CM

## 2024-01-12 DIAGNOSIS — N2889 Other specified disorders of kidney and ureter: Secondary | ICD-10-CM | POA: Insufficient documentation

## 2024-01-12 DIAGNOSIS — I517 Cardiomegaly: Secondary | ICD-10-CM | POA: Diagnosis not present

## 2024-01-12 DIAGNOSIS — I700 Atherosclerosis of aorta: Secondary | ICD-10-CM | POA: Insufficient documentation

## 2024-01-12 MED ORDER — NITROGLYCERIN 0.4 MG SL SUBL
0.8000 mg | SUBLINGUAL_TABLET | Freq: Once | SUBLINGUAL | Status: AC
  Administered 2024-01-12: 0.8 mg via SUBLINGUAL

## 2024-01-12 MED ORDER — IOHEXOL 350 MG/ML SOLN
100.0000 mL | Freq: Once | INTRAVENOUS | Status: AC | PRN
  Administered 2024-01-12: 100 mL via INTRAVENOUS

## 2024-01-12 MED ORDER — DILTIAZEM HCL 25 MG/5ML IV SOLN
10.0000 mg | INTRAVENOUS | Status: DC | PRN
Start: 2024-01-12 — End: 2024-01-13

## 2024-01-12 MED ORDER — METOPROLOL TARTRATE 5 MG/5ML IV SOLN: 10.0000 mg | Freq: Once | INTRAVENOUS | Status: DC | PRN

## 2024-01-14 ENCOUNTER — Other Ambulatory Visit: Payer: Self-pay | Admitting: *Deleted

## 2024-01-14 DIAGNOSIS — N2889 Other specified disorders of kidney and ureter: Secondary | ICD-10-CM

## 2024-01-14 NOTE — Progress Notes (Signed)
 Order placed for CT Abd w/wo for right kidney mass seen on cCTA overread.   Pt aware.

## 2024-01-18 ENCOUNTER — Other Ambulatory Visit: Payer: Self-pay | Admitting: Nurse Practitioner

## 2024-01-18 DIAGNOSIS — I10 Essential (primary) hypertension: Secondary | ICD-10-CM

## 2024-01-27 ENCOUNTER — Ambulatory Visit (HOSPITAL_COMMUNITY)
Admission: RE | Admit: 2024-01-27 | Discharge: 2024-01-27 | Disposition: A | Discharge status: Home / Self Care | Source: Ambulatory Visit | Attending: Internal Medicine | Admitting: Internal Medicine

## 2024-01-27 DIAGNOSIS — I351 Nonrheumatic aortic (valve) insufficiency: Secondary | ICD-10-CM | POA: Diagnosis not present

## 2024-01-27 DIAGNOSIS — R072 Precordial pain: Secondary | ICD-10-CM

## 2024-01-27 LAB — ECHOCARDIOGRAM COMPLETE
Area-P 1/2: 2.83 cm2
P 1/2 time: 435 ms
S' Lateral: 2.3 cm

## 2024-03-01 ENCOUNTER — Ambulatory Visit (HOSPITAL_COMMUNITY)
Admission: RE | Admit: 2024-03-01 | Discharge: 2024-03-01 | Disposition: A | Discharge status: Home / Self Care | Source: Ambulatory Visit | Attending: Cardiovascular Disease | Admitting: Cardiovascular Disease

## 2024-03-01 DIAGNOSIS — N200 Calculus of kidney: Secondary | ICD-10-CM | POA: Insufficient documentation

## 2024-03-01 DIAGNOSIS — K381 Appendicular concretions: Secondary | ICD-10-CM | POA: Insufficient documentation

## 2024-03-01 DIAGNOSIS — K429 Umbilical hernia without obstruction or gangrene: Secondary | ICD-10-CM | POA: Diagnosis not present

## 2024-03-01 DIAGNOSIS — R935 Abnormal findings on diagnostic imaging of other abdominal regions, including retroperitoneum: Secondary | ICD-10-CM | POA: Insufficient documentation

## 2024-03-01 DIAGNOSIS — R1900 Intra-abdominal and pelvic swelling, mass and lump, unspecified site: Secondary | ICD-10-CM | POA: Diagnosis not present

## 2024-03-01 DIAGNOSIS — K439 Ventral hernia without obstruction or gangrene: Secondary | ICD-10-CM | POA: Diagnosis not present

## 2024-03-01 DIAGNOSIS — N2889 Other specified disorders of kidney and ureter: Secondary | ICD-10-CM | POA: Insufficient documentation

## 2024-03-01 DIAGNOSIS — N281 Cyst of kidney, acquired: Secondary | ICD-10-CM | POA: Diagnosis not present

## 2024-03-01 MED ORDER — IOHEXOL 350 MG/ML SOLN
75.0000 mL | Freq: Once | INTRAVENOUS | Status: AC | PRN
  Administered 2024-03-01: 75 mL via INTRAVENOUS

## 2024-03-07 ENCOUNTER — Ambulatory Visit: Payer: Self-pay | Admitting: Cardiovascular Disease

## 2024-03-07 DIAGNOSIS — R935 Abnormal findings on diagnostic imaging of other abdominal regions, including retroperitoneum: Secondary | ICD-10-CM

## 2024-03-29 ENCOUNTER — Other Ambulatory Visit: Payer: Self-pay | Admitting: Nurse Practitioner

## 2024-03-29 ENCOUNTER — Ambulatory Visit (INDEPENDENT_AMBULATORY_CARE_PROVIDER_SITE_OTHER): Payer: 59 | Admitting: Nurse Practitioner

## 2024-03-29 ENCOUNTER — Encounter: Payer: Self-pay | Admitting: Nurse Practitioner

## 2024-03-29 VITALS — BP 142/80 | HR 81 | Resp 13 | Ht 60.0 in | Wt 152.2 lb

## 2024-03-29 DIAGNOSIS — E782 Mixed hyperlipidemia: Secondary | ICD-10-CM

## 2024-03-29 DIAGNOSIS — I10 Essential (primary) hypertension: Secondary | ICD-10-CM | POA: Diagnosis not present

## 2024-03-29 DIAGNOSIS — Z23 Encounter for immunization: Secondary | ICD-10-CM | POA: Diagnosis not present

## 2024-03-29 DIAGNOSIS — E119 Type 2 diabetes mellitus without complications: Secondary | ICD-10-CM | POA: Insufficient documentation

## 2024-03-29 DIAGNOSIS — E0869 Diabetes mellitus due to underlying condition with other specified complication: Secondary | ICD-10-CM | POA: Diagnosis not present

## 2024-03-29 DIAGNOSIS — J4540 Moderate persistent asthma, uncomplicated: Secondary | ICD-10-CM | POA: Diagnosis not present

## 2024-03-29 DIAGNOSIS — J3089 Other allergic rhinitis: Secondary | ICD-10-CM | POA: Diagnosis not present

## 2024-03-29 MED ORDER — TRIAMCINOLONE ACETONIDE 0.5 % EX OINT: TOPICAL_OINTMENT | CUTANEOUS | 5 refills | Status: AC

## 2024-03-29 MED ORDER — EZETIMIBE 10 MG PO TABS: ORAL_TABLET | ORAL | 5 refills | Status: DC

## 2024-03-29 MED ORDER — AMLODIPINE BESYLATE 10 MG PO TABS: 10.0000 mg | ORAL_TABLET | Freq: Every day | ORAL | 3 refills | Status: AC

## 2024-03-29 MED ORDER — CETIRIZINE HCL 10 MG PO TABS: 10.0000 mg | ORAL_TABLET | Freq: Every day | ORAL | 5 refills | Status: AC

## 2024-03-29 MED ORDER — HYDRALAZINE HCL 25 MG PO TABS: 25.0000 mg | ORAL_TABLET | Freq: Three times a day (TID) | ORAL | 1 refills | Status: DC | PRN

## 2024-03-29 NOTE — Assessment & Plan Note (Signed)
 Blood pressure is high today, but has been well controlled on amlodipine  and bisoprolol  on hydralazine .  She has not taken her bp meds this morning so will recheck at her [facility] later this week.

## 2024-03-29 NOTE — Assessment & Plan Note (Signed)
 Started on Dupixent  in October 2020 and has been doing well since this time, continues to follow up with allergist.  Also continues on wixela BID.

## 2024-03-29 NOTE — Assessment & Plan Note (Signed)
 Diet controlled, Encouraged dietary compliance, routine foot care/monitoring and to keep up with diabetic eye exams through ophthalmology

## 2024-03-29 NOTE — Assessment & Plan Note (Signed)
 Continues on zetia , LDL 74 on last labs

## 2024-03-29 NOTE — Assessment & Plan Note (Signed)
 Stable on current regimen

## 2024-03-29 NOTE — Patient Instructions (Addendum)
 To call and get appt  Tammie [name]. Tammie [name] due to abnormal findings on CT  Address: [address], [place], [place] [address] Phone: [phone]  Call and make appt for diabetic and full eye exam from  [facility] Address: [address] [place], [place], [place] [address] Phone: [phone]  To get eye drops refilled through [place] office.

## 2024-03-29 NOTE — Progress Notes (Signed)
 Careteam: Patient Care Team: Caro Harlene POUR, NP as PCP - General (Geriatric Medicine) Verlin Lonni BIRCH, MD as PCP - Cardiology (Cardiology) Octavia Lonni, MD as Consulting Physician (Ophthalmology) Kassie Acquanetta [place], MD as Consulting Physician (Pulmonary Disease) Iva Marty Saltness, MD as Consulting Physician (Allergy and Immunology)  PLACE OF SERVICE:  [facility]  Advanced Directive information    Allergies  Allergen Reactions   Ace Inhibitors Other (See Comments)    Angioedema   Aspirin Anaphylaxis, Swelling and Other (See Comments)    Throat swelling    Doxepin Anaphylaxis   Ibuprofen Anaphylaxis, Swelling and Other (See Comments)    Throat swelling   Nsaids Anaphylaxis, Swelling and Other (See Comments)   Lipitor [Atorvastatin] Swelling and Other (See Comments)    Face, feet swell    Chief Complaint  Patient presents with   Medical Management of Chronic Issues    6 month routine follow up     HPI:  Discussed the use of AI scribe software for clinical note transcription with the patient, who gave verbal consent to proceed.  History of Present Illness Tammie [name] is a 63 year old female who presents for a six-month follow-up visit.   She has experienced no current issues with her allergies or asthma since starting Dupixent  injections, which have effectively managed her symptoms.  She did not take her blood pressure medication this morning due to being rushed, although she usually takes it in the morning.  She recently visited a cardiologist due to concerns about her heart health, as her mother died from a heart attack. A CT scan of the coronaries was performed, which incidentally found a right kidney mass. A follow-up CT was performed, which showed poss  She has not yet completed her dental work due to financial constraints and insurance issues. She attempted to arrange financing but was unable to proceed with the dental surgery.  She is  the primary caregiver for her 65 year old autistic and nonverbal son, which causes her significant worry.  She has a support system but is concerned about potential cuts to services.  She reports a recent weight gain from 149 to 152 pounds and acknowledges a need to lose weight. She is not currently engaging in regular exercise but mentions walking occasionally.     Review of Systems:  Review of Systems  Constitutional:  Negative for chills, fever and weight loss.  HENT:  Negative for tinnitus.   Respiratory:  Negative for cough, sputum production and shortness of breath.   Cardiovascular:  Negative for chest pain, palpitations and leg swelling.  Gastrointestinal:  Negative for abdominal pain, constipation, diarrhea and heartburn.  Genitourinary:  Negative for dysuria, frequency and urgency.  Musculoskeletal:  Negative for back pain, falls, joint pain and myalgias.  Skin: Negative.   Neurological:  Negative for dizziness and headaches.  Psychiatric/Behavioral:  Negative for depression and memory loss. The patient does not have insomnia.     Past Medical History:  Diagnosis Date   Allergy    Anaphylaxis to ASA   Anemia    Anxiety    Eczema    Environmental allergies    GERD (gastroesophageal reflux disease)    Patient denies   Headache(784.0)    Hx of adenomatous colonic polyps    Hyperlipidemia    Hypertension    Moderate intermittent asthma    Pre-diabetes    Pre-diabetes    Respiratory failure, acute (HCC) 04/19/2014   Related to potential Anaphylaxis ? presumably to ASA -  exacerbated by Moderate to Severe Asthma   Past Surgical History:  Procedure Laterality Date   COLONOSCOPY     NASAL SINUS SURGERY     Polyps   POLYPECTOMY     TRANSTHORACIC ECHOCARDIOGRAM  04/2014   in setting of cardiopulmonary arrest): EF 65 to 70%.  Mild concentric LVH.  GR 1 DD.  Mild AI.;;  August 2019: EF 55 to 60%.  Mild LVH.  GR 1 DD.  Mild AI/MR.  Mildly increased PA pressures.   Social  History:   reports that she has never smoked. She has never used smokeless tobacco. She reports that she does not drink alcohol and does not use drugs.  Family History  Problem Relation Age of Onset   Heart attack Father    Heart attack Mother    Diabetes Sister    High blood pressure Brother    Hypertension Brother    Asthma Brother    High blood pressure Sister    Cancer Sister    Lung cancer Sister    High blood pressure Son    Cancer Maternal Grandmother    Heart disease Paternal Grandmother    Pancreatic cancer Paternal Grandmother    Asthma Other        Great paternal grandfather    Autism Son    Asthma Other        Neice    Colon cancer Neg Hx    Esophageal cancer Neg Hx    Rectal cancer Neg Hx    Stomach cancer Neg Hx    Allergic rhinitis Neg Hx    Angioedema Neg Hx    Eczema Neg Hx    Immunodeficiency Neg Hx    Atopy Neg Hx    Colon polyps Neg Hx     Medications: Patient's Medications  New Prescriptions   No medications on file  Previous Medications   ACETAMINOPHEN  (TYLENOL ) 160 MG/5ML SUSPENSION    Take by mouth every 6 (six) hours as needed.   ALBUTEROL  (VENTOLIN  HFA) 108 (90 BASE) MCG/ACT INHALER    INHALE 2 PUFFS INTO THE LUNGS EVERY 6 HOURS AS NEEDED FOR WHEEZING OR SHORTNESS OF BREATH   BISOPROLOL  (ZEBETA ) 5 MG TABLET    TAKE 1/2 TABLET BY MOUTH EVERY DAY FOR HIGH BLOOD PRESSURE   DUPILUMAB  (DUPIXENT ) 200 MG/1. PREFILLED SYRINGE    Inject 300 mg into the skin every 14 (fourteen) days.   DUPIXENT  300 MG/2ML PREFILLED SYRINGE    INJECT 300MG  SUBCUTANEOUSLY  EVERY OTHER WEEK   FLUTICASONE  (FLONASE ) 50 MCG/ACT NASAL SPRAY    Place 2 sprays into both nostrils daily. SHAKE LIQUID AND USE 2 SPRAYS IN EACH NOSTRIL DAILY   FLUTICASONE -SALMETEROL (WIXELA INHUB) 250-50 MCG/ACT AEPB    Inhale 1 puff into the lungs in the morning and at bedtime.   LATANOPROST (XALATAN) 0.005 % OPHTHALMIC SOLUTION    1 drop at bedtime.   METOPROLOL  TARTRATE (LOPRESSOR ) 100 MG  TABLET    Take 1 tablet (100 mg total) by mouth once for 1 dose. Take 90-120 minutes prior to scan.   MONTELUKAST  (SINGULAIR ) 10 MG TABLET    Take 1 tablet (10 mg total) by mouth at bedtime.   OMEGA-3 FATTY ACIDS (FISH OIL) 500 MG CAPS    Take 1 capsule by mouth daily.   POTASSIUM CHLORIDE  SA (KLOR-CON  M) 20 MEQ TABLET    TAKE 1 TABLET(20 MEQ) BY MOUTH DAILY  Modified Medications   Modified Medication Previous Medication   AMLODIPINE  (NORVASC ) 10 MG  TABLET amLODipine  (NORVASC ) 10 MG tablet      Take 1 tablet (10 mg total) by mouth daily.    TAKE 1 TABLET(10 MG) BY MOUTH DAILY FOR HIGH BLOOD PRESSURE   CETIRIZINE  (ZYRTEC ) 10 MG TABLET cetirizine  (ZYRTEC ) 10 MG tablet      Take 1 tablet (10 mg total) by mouth daily.    TAKE 1 TABLET(10 MG) BY MOUTH DAILY   EZETIMIBE  (ZETIA ) 10 MG TABLET ezetimibe  (ZETIA ) 10 MG tablet      TAKE 1 TABLET(10 MG) BY MOUTH DAILY    TAKE 1 TABLET(10 MG) BY MOUTH DAILY   HYDRALAZINE  (APRESOLINE ) 25 MG TABLET hydrALAZINE  (APRESOLINE ) 25 MG tablet      TAKE 1 TABLET(25 MG) BY MOUTH EVERY 8 HOURS AS NEEDED    TAKE 1 TABLET(25 MG) BY MOUTH EVERY 8 HOURS   TRIAMCINOLONE  OINTMENT (KENALOG ) 0.5 % triamcinolone  ointment (KENALOG ) 0.5 %      APPLY TOPICALLY TO THE AFFECTED AREA(S) TWICE DAILY    APPLY TOPICALLY TO THE AFFECTED AREA(S) TWICE DAILY  Discontinued Medications   No medications on file    Physical Exam:  Vitals:   03/29/24 1018 03/29/24 1022  BP: (!) 152/96 (!) 142/80  Pulse: 81   Resp: 13   SpO2: 93%   Weight: 152 lb 3.2 oz (69 kg)   Height: 5' (1.524 m)    Body mass index is 29.72 kg/m. Wt Readings from Last 3 Encounters:  03/29/24 152 lb 3.2 oz (69 kg)  12/18/23 150 lb 12.8 oz (68.4 kg)  09/29/23 149 lb 12.8 oz (67.9 kg)    Physical Exam Constitutional:      General: She is not in acute distress.    Appearance: She is well-developed. She is not diaphoretic.  HENT:     Head: Normocephalic and atraumatic.     Mouth/Throat:     Pharynx: No  oropharyngeal exudate.  Eyes:     Conjunctiva/sclera: Conjunctivae normal.     Pupils: Pupils are equal, round, and reactive to light.  Cardiovascular:     Rate and Rhythm: Normal rate and regular rhythm.     Heart sounds: Normal heart sounds.  Pulmonary:     Effort: Pulmonary effort is normal.     Breath sounds: Normal breath sounds.  Abdominal:     General: Bowel sounds are normal.     Palpations: Abdomen is soft.  Musculoskeletal:     Cervical back: Normal range of motion and neck supple.     Right lower leg: No edema.     Left lower leg: No edema.  Skin:    General: Skin is warm and dry.  Neurological:     Mental Status: She is alert.  Psychiatric:        Mood and Affect: Mood normal.     Labs reviewed: Basic Metabolic Panel: Recent Labs    09/29/23 1113 12/18/23 1043  NA 141 143  K 3.8 3.9  CL 106 106  CO2 29 20  GLUCOSE 104* 100*  BUN 8 7*  CREATININE 0.91 0.94  CALCIUM 9.6 10.0   Liver Function Tests: Recent Labs    09/29/23 1113  AST 19  ALT 21  BILITOT 0.5  PROT 7.1   No results for input(s): LIPASE, AMYLASE in the last 8760 hours. No results for input(s): AMMONIA in the last 8760 hours. CBC: Recent Labs    09/29/23 1113  WBC 5.6  NEUTROABS 3,002  HGB 14.2  HCT 43.0  MCV 81.9  PLT 332   Lipid Panel: Recent Labs    09/29/23 1113  CHOL 142  HDL 49*  LDLCALC 74  TRIG 893  CHOLHDL 2.9   TSH: No results for input(s): TSH in the last 8760 hours. A1C: Lab Results  Component Value Date   HGBA1C 6.0 (H) 09/29/2023     Assessment/Plan  Diabetes mellitus due to underlying condition with other specified complication, without long-term current use of insulin  (HCC) Assessment & Plan: Diet controlled, Encouraged dietary compliance, routine foot care/monitoring and to keep up with diabetic eye exams through ophthalmology    Orders: -     Hemoglobin A1c  Essential hypertension Assessment & Plan: Blood pressure is high  today, but has been well controlled on amlodipine  and bisoprolol  on hydralazine .  She has not taken her bp meds this morning so will recheck at her AWV later this week.   Orders: -     amLODIPine  Besylate; Take 1 tablet (10 mg total) by mouth daily.  Dispense: 90 tablet; Refill: 3 -     CBC with Differential/Platelet -     Comprehensive metabolic panel with GFR  Moderate persistent asthma without complication Assessment & Plan: Started on Dupixent  in October 2020 and has been doing well since this time, continues to follow up with allergist.  Also continues on wixela BID.   Orders: -     Cetirizine  HCl; Take 1 tablet (10 mg total) by mouth daily.  Dispense: 30 tablet; Refill: 5  Mixed hyperlipidemia Assessment & Plan: Continues on zetia , LDL 74 on last labs  Orders: -     Ezetimibe ; TAKE 1 TABLET(10 MG) BY MOUTH DAILY  Dispense: 30 tablet; Refill: 5  Non-seasonal allergic rhinitis, unspecified trigger Assessment & Plan: Stable on current regimen    Immunization due -     Pneumococcal conjugate vaccine 20-valent  Other orders -     Triamcinolone  Acetonide; APPLY TOPICALLY TO THE AFFECTED AREA(S) TWICE DAILY  Dispense: 15 g; Refill: 5     Return in about 6 months (around 09/29/2024) for routine follow up, labs at time of visit.  Gurtha Picker K. Caro BODILY [facility] [phone]

## 2024-03-30 ENCOUNTER — Ambulatory Visit: Payer: Self-pay | Admitting: Nurse Practitioner

## 2024-03-30 LAB — CBC WITH DIFFERENTIAL/PLATELET
Absolute Lymphocytes: 2049 {cells}/uL (ref 850–3900)
Absolute Monocytes: 573 {cells}/uL (ref 200–950)
Basophils Absolute: 83 {cells}/uL (ref 0–200)
Basophils Relative: 1.2 %
Eosinophils Absolute: 883 {cells}/uL — ABNORMAL HIGH (ref 15–500)
Eosinophils Relative: 12.8 %
HCT: 40.9 % (ref 35.0–45.0)
Hemoglobin: 13.2 g/dL (ref 11.7–15.5)
MCH: 26.9 pg — ABNORMAL LOW (ref 27.0–33.0)
MCHC: 32.3 g/dL (ref 32.0–36.0)
MCV: 83.5 fL (ref 80.0–100.0)
MPV: 9.8 fL (ref 7.5–12.5)
Monocytes Relative: 8.3 %
Neutro Abs: 3312 {cells}/uL (ref 1500–7800)
Neutrophils Relative %: 48 %
Platelets: 290 Thousand/uL (ref 140–400)
RBC: 4.9 Million/uL (ref 3.80–5.10)
RDW: 13.3 % (ref 11.0–15.0)
Total Lymphocyte: 29.7 %
WBC: 6.9 Thousand/uL (ref 3.8–10.8)

## 2024-03-30 LAB — HEMOGLOBIN A1C
Hgb A1c MFr Bld: 6.2 % — ABNORMAL HIGH (ref <5.7)
Mean Plasma Glucose: 131 mg/dL
eAG (mmol/L): 7.3 mmol/L

## 2024-03-30 LAB — COMPREHENSIVE METABOLIC PANEL WITH GFR
AG Ratio: 1.3 (calc) (ref 1.0–2.5)
ALT: 18 U/L (ref 6–29)
AST: 22 U/L (ref 10–35)
Albumin: 4 g/dL (ref 3.6–5.1)
Alkaline phosphatase (APISO): 100 U/L (ref 37–153)
BUN: 7 mg/dL (ref 7–25)
CO2: 27 mmol/L (ref 20–32)
Calcium: 9.4 mg/dL (ref 8.6–10.4)
Chloride: 107 mmol/L (ref 98–110)
Creat: 0.93 mg/dL (ref 0.50–1.05)
Globulin: 3.1 g/dL (ref 1.9–3.7)
Glucose, Bld: 117 mg/dL — ABNORMAL HIGH (ref 65–99)
Potassium: 3.3 mmol/L — ABNORMAL LOW (ref 3.5–5.3)
Sodium: 141 mmol/L (ref 135–146)
Total Bilirubin: 0.6 mg/dL (ref 0.2–1.2)
Total Protein: 7.1 g/dL (ref 6.1–8.1)
eGFR: 68 mL/min/1.73m2 (ref >=60)

## 2024-04-02 ENCOUNTER — Ambulatory Visit: Payer: 59 | Admitting: Nurse Practitioner

## 2024-04-02 ENCOUNTER — Encounter: Payer: Self-pay | Admitting: Nurse Practitioner

## 2024-04-02 VITALS — BP 136/74 | HR 81 | Temp 97.3°F | Ht 60.0 in

## 2024-04-02 DIAGNOSIS — E876 Hypokalemia: Secondary | ICD-10-CM | POA: Diagnosis not present

## 2024-04-02 DIAGNOSIS — E2839 Other primary ovarian failure: Secondary | ICD-10-CM

## 2024-04-02 DIAGNOSIS — Z0000 Encounter for general adult medical examination without abnormal findings: Secondary | ICD-10-CM | POA: Diagnosis not present

## 2024-04-02 MED ORDER — POTASSIUM CHLORIDE CRYS ER 20 MEQ PO TBCR: 20.0000 meq | EXTENDED_RELEASE_TABLET | Freq: Every day | ORAL | 11 refills | Status: AC

## 2024-04-02 MED ORDER — ALBUTEROL SULFATE HFA 108 (90 BASE) MCG/ACT IN AERS: 2.0000 | INHALATION_SPRAY | Freq: Four times a day (QID) | RESPIRATORY_TRACT | 2 refills | Status: DC | PRN

## 2024-04-02 NOTE — Progress Notes (Signed)
 Subjective:   Tammie [name] is a 63 y.o. female who presents for Medicare Annual (Subsequent) preventive examination.  Visit Complete: In person [facility] clinic   Cardiac Risk Factors include: advanced age (>11men, >29 women);hypertension;diabetes mellitus;dyslipidemia;sedentary lifestyle     Objective:    Today's Vitals   04/02/24 1352  BP: 136/74  Pulse: 81  Temp: (!) 97.3 F (36.3 C)  SpO2: 95%  Height: 5' (1.524 m)  PainSc: 3    Body mass index is 29.72 kg/m.     09/29/2023   10:15 AM 03/31/2023    2:05 PM 03/28/2023    8:54 AM 09/30/2022   10:23 AM 05/27/2022    8:41 AM 03/25/2022   10:30 AM 02/25/2022   10:52 AM  Advanced Directives  Does Patient Have a Medical Advance Directive? No No No No No No No  Would patient like information on creating a medical advance directive? No - Patient declined No - Patient declined No - Patient declined  Yes (MAU/Ambulatory/Procedural Areas - Information given) No - Patient declined No - Patient declined    Current Medications (verified) Outpatient Encounter Medications as of 04/02/2024  Medication Sig   acetaminophen  (TYLENOL ) 160 MG/5ML suspension Take by mouth every 6 (six) hours as needed.   amLODipine  (NORVASC ) 10 MG tablet Take 1 tablet (10 mg total) by mouth daily.   bisoprolol  (ZEBETA ) 5 MG tablet TAKE 1/2 TABLET BY MOUTH EVERY DAY FOR HIGH BLOOD PRESSURE   cetirizine  (ZYRTEC ) 10 MG tablet Take 1 tablet (10 mg total) by mouth daily.   DUPIXENT  300 MG/2ML prefilled syringe INJECT 300MG  SUBCUTANEOUSLY  EVERY OTHER WEEK   ezetimibe  (ZETIA ) 10 MG tablet TAKE 1 TABLET(10 MG) BY MOUTH DAILY   fluticasone  (FLONASE ) 50 MCG/ACT nasal spray Place 2 sprays into both nostrils daily. SHAKE LIQUID AND USE 2 SPRAYS IN EACH NOSTRIL DAILY   fluticasone -salmeterol (WIXELA INHUB) 250-50 MCG/ACT AEPB Inhale 1 puff into the lungs in the morning and at bedtime.   hydrALAZINE  (APRESOLINE ) 25 MG tablet TAKE 1 TABLET(25 MG) BY MOUTH EVERY 8 HOURS AS  NEEDED   latanoprost (XALATAN) 0.005 % ophthalmic solution 1 drop at bedtime.   montelukast  (SINGULAIR ) 10 MG tablet Take 1 tablet (10 mg total) by mouth at bedtime.   Omega-3 Fatty Acids (FISH OIL) 500 MG CAPS Take 1 capsule by mouth daily.   potassium chloride  SA (KLOR-CON  M) 20 MEQ tablet TAKE 1 TABLET(20 MEQ) BY MOUTH DAILY   triamcinolone  ointment (KENALOG ) 0.5 % APPLY TOPICALLY TO THE AFFECTED AREA(S) TWICE DAILY   [DISCONTINUED] albuterol  (VENTOLIN  HFA) 108 (90 Base) MCG/ACT inhaler INHALE 2 PUFFS INTO THE LUNGS EVERY 6 HOURS AS NEEDED FOR WHEEZING OR SHORTNESS OF BREATH   albuterol  (VENTOLIN  HFA) 108 (90 Base) MCG/ACT inhaler Inhale 2 puffs into the lungs every 6 (six) hours as needed for wheezing or shortness of breath.   [DISCONTINUED] dupilumab  (DUPIXENT ) 200 MG/1. prefilled syringe Inject 300 mg into the skin every 14 (fourteen) days.   [DISCONTINUED] metoprolol  tartrate (LOPRESSOR ) 100 MG tablet Take 1 tablet (100 mg total) by mouth once for 1 dose. Take 90-120 minutes prior to scan. (Patient not taking: Reported on 03/29/2024)   Facility-Administered Encounter Medications as of 04/02/2024  Medication   dupilumab  (DUPIXENT ) prefilled syringe 300 mg    Allergies (verified) Ace inhibitors, Aspirin, Doxepin, Ibuprofen, Nsaids, and Lipitor [atorvastatin]   History: Past Medical History:  Diagnosis Date   Allergy    Anaphylaxis to ASA   Anemia    Anxiety  Eczema    Environmental allergies    GERD (gastroesophageal reflux disease)    Patient denies   Headache(784.0)    Hx of adenomatous colonic polyps    Hyperlipidemia    Hypertension    Moderate intermittent asthma    Pre-diabetes    Pre-diabetes    Respiratory failure, acute (HCC) 04/19/2014   Related to potential Anaphylaxis ? presumably to ASA - exacerbated by Moderate to Severe Asthma   Past Surgical History:  Procedure Laterality Date   COLONOSCOPY     NASAL SINUS SURGERY     Polyps   POLYPECTOMY      TRANSTHORACIC ECHOCARDIOGRAM  04/2014   in setting of cardiopulmonary arrest): EF 65 to 70%.  Mild concentric LVH.  GR 1 DD.  Mild AI.;;  August 2019: EF 55 to 60%.  Mild LVH.  GR 1 DD.  Mild AI/MR.  Mildly increased PA pressures.   Family History  Problem Relation Age of Onset   Heart attack Father    Heart attack Mother    Diabetes Sister    High blood pressure Brother    Hypertension Brother    Asthma Brother    High blood pressure Sister    Cancer Sister    Lung cancer Sister    High blood pressure Son    Cancer Maternal Grandmother    Heart disease Paternal Grandmother    Pancreatic cancer Paternal Grandmother    Asthma Other        Great paternal grandfather    Autism Son    Asthma Other        Neice    Colon cancer Neg Hx    Esophageal cancer Neg Hx    Rectal cancer Neg Hx    Stomach cancer Neg Hx    Allergic rhinitis Neg Hx    Angioedema Neg Hx    Eczema Neg Hx    Immunodeficiency Neg Hx    Atopy Neg Hx    Colon polyps Neg Hx    Social History   Socioeconomic History   Marital status: Single    Spouse name: Not on file   Number of children: 1   Years of education: Not on file   Highest education level: Not on file  Occupational History   Occupation: Disability  Tobacco Use   Smoking status: Never   Smokeless tobacco: Never  Vaping Use   Vaping status: Never Used  Substance and Sexual Activity   Alcohol use: No   Drug use: No   Sexual activity: Not Currently  Other Topics Concern   Not on file  Social History Narrative   Diet-Salt Free   Drinks Soda-Caffeine    Lives in an apartment, one stories, one person in home, no pets   Current/past profession: Dietary, dish-room, and cooks    Exercises 2 x weekly   Social Drivers of Health   Financial Resource Strain: Low Risk  (03/10/2018)   Overall Financial Resource Strain (CARDIA)    Difficulty of Paying Living Expenses: Not hard at all  Food Insecurity: No Food Insecurity (03/10/2018)   Hunger Vital  Sign    Worried About Running Out of Food in the Last Year: Never true    Ran Out of Food in the Last Year: Never true  Transportation Needs: No Transportation Needs (03/10/2018)   PRAPARE - Administrator, Civil Service (Medical): No    Lack of Transportation (Non-Medical): No  Physical Activity: Inactive (03/10/2018)   Exercise Vital  Sign    Days of Exercise per Week: 0 days    Minutes of Exercise per Session: 0 min  Stress: No Stress Concern Present (03/10/2018)   [employer] of Occupational Health - Occupational Stress Questionnaire    Feeling of Stress : Only a little  Social Connections: Somewhat Isolated (03/10/2018)   Social Connection and Isolation Panel    Frequency of Communication with Friends and Family: More than three times a week    Frequency of Social Gatherings with Friends and Family: More than three times a week    Attends Religious Services: More than 4 times per year    Active Member of [employer] or Organizations: No    Attends Engineer, structural: Never    Marital Status: Never married    Tobacco Counseling Counseling given: Not Answered   Clinical Intake:  Pre-visit preparation completed: Yes  Pain : 0-10 Pain Score: 3  Pain Type: Chronic pain Pain Location: Generalized Pain Descriptors / Indicators: Aching Pain Onset: More than a month ago Pain Frequency: Constant     BMI - recorded: 29 Nutritional Risks: None  How often do you need to have someone help you when you read instructions, pamphlets, or other written materials from your doctor or pharmacy?: 1 - Never         Activities of Daily Living    04/02/2024    1:54 PM  In your present state of health, do you have any difficulty performing the following activities:  Hearing? 0  Vision? 0  Difficulty concentrating or making decisions? 0  Walking or climbing stairs? 0  Dressing or bathing? 0  Doing errands, shopping? 0  Preparing Food and eating ? N  Using the  Toilet? N  In the past six months, have you accidently leaked urine? N  Do you have problems with loss of bowel control? N  Managing your Medications? N  Managing your Finances? N  Housekeeping or managing your Housekeeping? N    Patient Care Team: Caro Harlene POUR, NP as PCP - General (Geriatric Medicine) Verlin Lonni BIRCH, MD as PCP - Cardiology (Cardiology) Octavia Lonni, MD as Consulting Physician (Ophthalmology) Kassie Acquanetta Bradley, MD as Consulting Physician (Pulmonary Disease) Iva Marty Saltness, MD as Consulting Physician (Allergy and Immunology)  Indicate any recent Medical Services you may have received from other than [facility] providers in the past year (date may be approximate).     Assessment:   This is a routine wellness examination for Tammie [name].  Hearing/Vision screen Hearing Screening - Comments:: No hearing issues  Vision Screening - Comments:: No vision issues. Patient plans to schedule an eye exam soon.    Goals Addressed   None    Depression Screen    03/29/2024   10:13 AM 09/29/2023   10:15 AM 03/31/2023    2:06 PM 03/28/2023    8:54 AM 09/30/2022   10:23 AM 03/25/2022   10:42 AM 02/25/2022   10:49 AM  PHQ 2/9 Scores  PHQ - 2 Score 0 0 0 0 0 0 0    Fall Risk    03/29/2024   10:13 AM 09/29/2023   10:14 AM 03/31/2023    2:06 PM 03/28/2023    8:54 AM 09/30/2022   10:23 AM  Fall Risk   Falls in the past year? 0 0 0 0 0  Number falls in past yr: 0 0 0 0 0  Injury with Fall? 0 0 0 0 0  Risk for fall due to :  No Fall Risks No Fall Risks  No Fall Risks   Follow up Falls evaluation completed Falls evaluation completed;Education provided;Falls prevention discussed  Falls evaluation completed Falls evaluation completed    MEDICARE RISK AT HOME:    TIMED UP AND GO:  Was the test performed?  No    Cognitive Function:    02/07/2017    9:17 AM  MMSE - Mini Mental State Exam  Not completed: --        04/02/2024    2:02 PM 03/31/2023    2:06 PM  03/20/2021    1:17 PM 03/14/2020   10:50 AM  6CIT Screen  What Year? 0 points 0 points 0 points 0 points  What month? 0 points 0 points 0 points 0 points  What time? 0 points 0 points 0 points 0 points  Count back from 20 2 points 0 points 0 points 0 points  Months in reverse 0 points 0 points 2 points 0 points  Repeat phrase 2 points 2 points 4 points 4 points  Total Score 4 points 2 points 6 points 4 points    Immunizations Immunization History  Administered Date(s) Administered   Fluad Quad(high Dose 65+) 05/10/2020, 08/27/2021, 05/27/2022   Fluad Trivalent(High Dose 65+) 03/31/2023   Influenza Inj Mdck Quad Pf 06/29/2019   Influenza,inj,Quad PF,6+ Mos 05/26/2014, 06/21/2016, 05/27/2017, 05/19/2018, 03/30/2019   Influenza-Unspecified 05/06/2015, 05/23/2017   PFIZER(Purple Top)SARS-COV-2 Vaccination 10/21/2019, 11/16/2019, 06/23/2020, 02/14/2021   PNEUMOCOCCAL CONJUGATE-20 03/29/2024   Pfizer Covid-19 Vaccine Bivalent Booster 29yrs & up 06/08/2021   Pfizer(Comirnaty )Fall Seasonal Vaccine 12 years and older 07/08/2023   Pneumococcal Conjugate-13 12/17/2016   Pneumococcal Polysaccharide-23 03/12/2019, 06/29/2019   Tdap 10/04/2016, 07/22/2017   Zoster Recombinant(Shingrix) 12/17/2016, 02/19/2017    TDAP status: Up to date  Flu Vaccine status: Due, Education has been provided regarding the importance of this vaccine. Advised may receive this vaccine at local pharmacy or Health Dept. Aware to provide a copy of the vaccination record if obtained from local pharmacy or Health Dept. Verbalized acceptance and understanding.  Pneumococcal vaccine status: Up to date  Covid-19 vaccine status: Information provided on how to obtain vaccines.   Qualifies for Shingles Vaccine? Yes   Zostavax completed No   Shingrix Completed?: Yes  Screening Tests Health Maintenance  Topic Date Due   OPHTHALMOLOGY EXAM  09/20/2023   DEXA SCAN  Never done   COVID-19 Vaccine (7 - 2024-25 season)  01/06/2024   INFLUENZA VACCINE  04/05/2024 (Originally 03/05/2024)   Diabetic kidney evaluation - Urine ACR  09/28/2024   HEMOGLOBIN A1C  09/29/2024   Diabetic kidney evaluation - eGFR measurement  03/29/2025   FOOT EXAM  03/29/2025   Medicare Annual Wellness (AWV)  04/02/2025   Cervical Cancer Screening (HPV/Pap Cotest)  05/27/2025   MAMMOGRAM  10/20/2025   Colonoscopy  02/07/2026   DTaP/Tdap/Td (3 - Td or Tdap) 07/23/2027   Pneumococcal Vaccine: 50+ Years  Completed   HIV Screening  Completed   Zoster Vaccines- Shingrix  Completed   Hepatitis B Vaccines 19-59 Average Risk  Aged Out   HPV VACCINES  Aged Out   Meningococcal B Vaccine  Aged Out   Hepatitis C Screening  Discontinued    Health Maintenance  Health Maintenance Due  Topic Date Due   OPHTHALMOLOGY EXAM  09/20/2023   DEXA SCAN  Never done   COVID-19 Vaccine (7 - 2024-25 season) 01/06/2024    Colorectal cancer screening: Type of screening: Colonoscopy. Completed 2022. Repeat every 5 years  Mammogram status: Completed 10/2023. Repeat every year  Bone Density status: Ordered today. Pt provided with contact info and advised to call to schedule appt.  Lung Cancer Screening: (Low Dose CT Chest recommended if Age 67-80 years, 20 pack-year currently smoking OR have quit w/in 15years.) does not qualify.   Lung Cancer Screening Referral: na  Additional Screening:  Hepatitis C Screening: does qualify; Completed  Vision Screening: Recommended annual ophthalmology exams for early detection of glaucoma and other disorders of the eye. Is the patient up to date with their annual eye exam?  No  Who is the provider or what is the name of the office in which the patient attends annual eye exams? [facility] If pt is not established with a provider, would they like to be referred to a provider to establish care? No .   Dental Screening: Recommended annual dental exams for proper oral hygiene  Diabetic Foot Exam: Diabetic Foot Exam:  Completed 03/29/2024  Community Resource Referral / Chronic Care Management: CRR required this visit?  No   CCM required this visit?  No     Plan:     I have personally reviewed and noted the following in the patient's chart:   Medical and social history Use of alcohol, tobacco or illicit drugs  Current medications and supplements including opioid prescriptions. Patient is not currently taking opioid prescriptions. Functional ability and status Nutritional status Physical activity Advanced directives List of other physicians Hospitalizations, surgeries, and ER visits in previous 12 months Vitals Screenings to include cognitive, depression, and falls Referrals and appointments  In addition, I have reviewed and discussed with patient certain preventive protocols, quality metrics, and best practice recommendations. A written personalized care plan for preventive services as well as general preventive health recommendations were provided to patient.     Harlene MARLA An, NP   04/02/2024

## 2024-04-02 NOTE — Patient Instructions (Addendum)
  [name] , Thank you for taking time to come for your Medicare Wellness Visit. I appreciate your ongoing commitment to your health goals. Please review the following plan we discussed and let me know if I can assist you in the future.    To get FLU shot and COVID booster at local pharmacy   This is a list of the screening recommended for you and due dates:  Health Maintenance  Topic Date Due   Eye exam for diabetics  09/20/2023   DEXA scan (bone density measurement)  Never done   COVID-19 Vaccine (7 - 2024-25 season) 01/06/2024   Flu Shot  04/05/2024*   Yearly kidney health urinalysis for diabetes  09/28/2024   Hemoglobin A1C  09/29/2024   Yearly kidney function blood test for diabetes  03/29/2025   Complete foot exam   03/29/2025   Medicare Annual Wellness Visit  04/02/2025   Pap with HPV screening  05/27/2025   Mammogram  10/20/2025   Colon Cancer Screening  02/07/2026   DTaP/Tdap/Td vaccine (3 - Td or Tdap) 07/23/2027   Pneumococcal Vaccine for age over 39  Completed   HIV Screening  Completed   Zoster (Shingles) Vaccine  Completed   Hepatitis B Vaccine  Aged Out   HPV Vaccine  Aged Out   Meningitis B Vaccine  Aged Out   Hepatitis C Screening  Discontinued  *Topic was postponed. The date shown is not the original due date.

## 2024-04-20 ENCOUNTER — Other Ambulatory Visit: Payer: Self-pay | Admitting: Allergy & Immunology

## 2024-08-03 ENCOUNTER — Other Ambulatory Visit: Payer: Self-pay | Admitting: Cardiovascular Disease

## 2024-08-19 ENCOUNTER — Other Ambulatory Visit: Payer: Self-pay | Admitting: *Deleted

## 2024-08-19 ENCOUNTER — Telehealth: Payer: Self-pay | Admitting: Cardiovascular Disease

## 2024-08-19 ENCOUNTER — Telehealth: Payer: Self-pay

## 2024-08-19 DIAGNOSIS — R935 Abnormal findings on diagnostic imaging of other abdominal regions, including retroperitoneum: Secondary | ICD-10-CM

## 2024-08-19 DIAGNOSIS — N2889 Other specified disorders of kidney and ureter: Secondary | ICD-10-CM

## 2024-08-19 NOTE — Telephone Encounter (Signed)
 Referral has been placed.

## 2024-08-19 NOTE — Telephone Encounter (Signed)
 Patient notified.  Order placed.  Patient will stop by [facility] to have BMP drawn in the next few days.

## 2024-08-19 NOTE — Telephone Encounter (Signed)
 Copied from CRM [id number]. Topic: Referral - Status >> Aug 19, 2024  2:16 PM Diannia H wrote: Reason for CRM: Merlynn called about the referral from Verlin Lonni BIRCH, MD [facility] [place].  [facility] sent a referral to [facility] and the PCP needs to submit a referral as well so they can see the patient.    Merlynn callback number is [phone].

## 2024-08-19 NOTE — Telephone Encounter (Signed)
 Caller Jules) stated patient was referred to their office but the will need another CT scan prior to seeing the patient.

## 2024-08-19 NOTE — Telephone Encounter (Signed)
 I spoke with Merlynn at [facility].  They received fax from our office recently following up on referral.  Per Merlynn patient will need  repeat CT scan of the abdomen/pelvis with IV and oral contrast to make sure there is not an adnexal mass as recommended on previous CT scan before having visit in their office

## 2024-08-19 NOTE — Telephone Encounter (Signed)
 Spoke with Mrs. Merlynn to let her know the referral has been placed.

## 2024-08-19 NOTE — Addendum Note (Signed)
 Addended by: Billey Wojciak K on: 08/19/2024 02:44 PM   Modules accepted: Orders

## 2024-08-26 ENCOUNTER — Ambulatory Visit (HOSPITAL_COMMUNITY)
Admission: RE | Admit: 2024-08-26 | Discharge: 2024-08-26 | Disposition: A | Discharge status: Home / Self Care | Source: Ambulatory Visit | Attending: Cardiovascular Disease | Admitting: Cardiovascular Disease

## 2024-08-26 DIAGNOSIS — N2889 Other specified disorders of kidney and ureter: Secondary | ICD-10-CM | POA: Insufficient documentation

## 2024-08-26 MED ORDER — IOHEXOL 9 MG/ML PO SOLN: 500.0000 mL | ORAL | Status: AC

## 2024-08-26 MED ORDER — IOHEXOL 350 MG/ML SOLN
75.0000 mL | Freq: Once | INTRAVENOUS | Status: AC | PRN
  Administered 2024-08-26: 75 mL via INTRAVENOUS

## 2024-09-01 ENCOUNTER — Ambulatory Visit: Payer: Self-pay | Admitting: Cardiovascular Disease

## 2024-09-01 ENCOUNTER — Other Ambulatory Visit: Payer: Self-pay | Admitting: Nurse Practitioner

## 2024-09-01 DIAGNOSIS — E782 Mixed hyperlipidemia: Secondary | ICD-10-CM

## 2024-09-27 ENCOUNTER — Ambulatory Visit: Payer: Self-pay | Admitting: Nurse Practitioner

## 2024-11-17 ENCOUNTER — Other Ambulatory Visit (HOSPITAL_BASED_OUTPATIENT_CLINIC_OR_DEPARTMENT_OTHER)

## 2024-12-14 ENCOUNTER — Ambulatory Visit: Admitting: Cardiovascular Disease

## 2025-04-04 ENCOUNTER — Ambulatory Visit: Payer: Self-pay | Admitting: Nurse Practitioner
